# Patient Record
Sex: Female | Born: 1945 | Race: White | Hispanic: No | Smoking: Former smoker
Health system: Southern US, Community
[De-identification: ages and names within clinical notes are randomized; demographics above are authoritative.]

## PROBLEM LIST (undated history)

## (undated) DIAGNOSIS — G4730 Sleep apnea, unspecified: Secondary | ICD-10-CM

## (undated) DIAGNOSIS — N189 Chronic kidney disease, unspecified: Secondary | ICD-10-CM

## (undated) DIAGNOSIS — I5082 Biventricular heart failure: Secondary | ICD-10-CM

## (undated) DIAGNOSIS — G4733 Obstructive sleep apnea (adult) (pediatric): Secondary | ICD-10-CM

## (undated) DIAGNOSIS — M1990 Unspecified osteoarthritis, unspecified site: Secondary | ICD-10-CM

## (undated) DIAGNOSIS — I10 Essential (primary) hypertension: Secondary | ICD-10-CM

## (undated) DIAGNOSIS — M2042 Other hammer toe(s) (acquired), left foot: Secondary | ICD-10-CM

## (undated) DIAGNOSIS — I5022 Chronic systolic (congestive) heart failure: Secondary | ICD-10-CM

## (undated) DIAGNOSIS — I4892 Unspecified atrial flutter: Secondary | ICD-10-CM

## (undated) DIAGNOSIS — L84 Corns and callosities: Secondary | ICD-10-CM

## (undated) DIAGNOSIS — E539 Vitamin B deficiency, unspecified: Secondary | ICD-10-CM

## (undated) DIAGNOSIS — E1142 Type 2 diabetes mellitus with diabetic polyneuropathy: Secondary | ICD-10-CM

## (undated) DIAGNOSIS — E785 Hyperlipidemia, unspecified: Secondary | ICD-10-CM

## (undated) DIAGNOSIS — I639 Cerebral infarction, unspecified: Secondary | ICD-10-CM

## (undated) DIAGNOSIS — M2041 Other hammer toe(s) (acquired), right foot: Secondary | ICD-10-CM

## (undated) DIAGNOSIS — E079 Disorder of thyroid, unspecified: Secondary | ICD-10-CM

## (undated) DIAGNOSIS — J9601 Acute respiratory failure with hypoxia: Secondary | ICD-10-CM

## (undated) DIAGNOSIS — I6350 Cerebral infarction due to unspecified occlusion or stenosis of unspecified cerebral artery: Secondary | ICD-10-CM

## (undated) DIAGNOSIS — E1139 Type 2 diabetes mellitus with other diabetic ophthalmic complication: Secondary | ICD-10-CM

## (undated) DIAGNOSIS — E039 Hypothyroidism, unspecified: Secondary | ICD-10-CM

## (undated) HISTORY — DX: Hyperlipidemia, unspecified: E78.5

## (undated) HISTORY — DX: Other hammer toe(s) (acquired), right foot: M20.41

## (undated) HISTORY — DX: Cerebral infarction due to unspecified occlusion or stenosis of unspecified cerebral artery: I63.50

## (undated) HISTORY — DX: Vitamin B deficiency, unspecified: E53.9

## (undated) HISTORY — PX: CHOLECYSTECTOMY: SHX55

## (undated) HISTORY — DX: Unspecified osteoarthritis, unspecified site: M19.90

## (undated) HISTORY — DX: Sleep apnea, unspecified: G47.30

## (undated) HISTORY — DX: Unspecified atrial flutter: I48.92

## (undated) HISTORY — PX: KNEE SURGERY: SHX244

## (undated) HISTORY — DX: Obstructive sleep apnea (adult) (pediatric): G47.33

## (undated) HISTORY — PX: APPENDECTOMY: SHX54

## (undated) HISTORY — DX: Corns and callosities: L84

## (undated) HISTORY — DX: Type 2 diabetes mellitus with other diabetic ophthalmic complication: E11.39

## (undated) HISTORY — DX: Other hammer toe(s) (acquired), left foot: M20.42

## (undated) HISTORY — DX: Chronic systolic (congestive) heart failure: I50.22

## (undated) HISTORY — DX: Acute respiratory failure with hypoxia: J96.01

## (undated) HISTORY — PX: GASTROCYSTOPLASTY: SHX1696

## (undated) HISTORY — DX: Cerebral infarction, unspecified: I63.9

## (undated) HISTORY — DX: Essential (primary) hypertension: I10

## (undated) HISTORY — DX: Biventricular heart failure: I50.82

## (undated) HISTORY — DX: Disorder of thyroid, unspecified: E07.9

## (undated) HISTORY — DX: Type 2 diabetes mellitus with diabetic polyneuropathy: E11.42

---
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
## 1999-09-09 HISTORY — PX: KNEE SURGERY: SHX244

## 1999-12-19 ENCOUNTER — Encounter: Payer: Self-pay | Admitting: Orthopedic Surgery

## 1999-12-25 ENCOUNTER — Inpatient Hospital Stay (HOSPITAL_COMMUNITY): Admission: RE | Admit: 1999-12-25 | Discharge: 1999-12-29 | Payer: Self-pay | Admitting: Orthopedic Surgery

## 2000-03-13 ENCOUNTER — Inpatient Hospital Stay (HOSPITAL_COMMUNITY): Admission: RE | Admit: 2000-03-13 | Discharge: 2000-03-17 | Payer: Self-pay | Admitting: Orthopedic Surgery

## 2001-11-25 ENCOUNTER — Inpatient Hospital Stay (HOSPITAL_COMMUNITY): Admission: EM | Admit: 2001-11-25 | Discharge: 2001-11-26 | Payer: Self-pay | Admitting: *Deleted

## 2004-09-08 HISTORY — PX: GASTROCYSTOPLASTY: SHX1696

## 2005-07-02 ENCOUNTER — Ambulatory Visit (HOSPITAL_COMMUNITY): Admission: RE | Admit: 2005-07-02 | Discharge: 2005-07-02 | Payer: Self-pay | Admitting: General Surgery

## 2005-07-02 ENCOUNTER — Ambulatory Visit (HOSPITAL_COMMUNITY): Admission: RE | Admit: 2005-07-02 | Discharge: 2005-07-02 | Payer: Self-pay | Admitting: Obstetrics and Gynecology

## 2005-08-05 ENCOUNTER — Encounter: Admission: RE | Admit: 2005-08-05 | Discharge: 2005-09-07 | Payer: Self-pay | Admitting: Surgery

## 2005-09-22 ENCOUNTER — Encounter: Admission: RE | Admit: 2005-09-22 | Discharge: 2005-12-21 | Payer: Self-pay | Admitting: Surgery

## 2005-10-06 ENCOUNTER — Inpatient Hospital Stay (HOSPITAL_COMMUNITY): Admission: RE | Admit: 2005-10-06 | Discharge: 2005-10-08 | Payer: Self-pay | Admitting: Surgery

## 2005-12-31 ENCOUNTER — Encounter: Admission: RE | Admit: 2005-12-31 | Discharge: 2006-02-03 | Payer: Self-pay | Admitting: Surgery

## 2006-03-04 ENCOUNTER — Encounter: Admission: RE | Admit: 2006-03-04 | Discharge: 2006-06-02 | Payer: Self-pay | Admitting: Surgery

## 2007-06-29 ENCOUNTER — Ambulatory Visit (HOSPITAL_COMMUNITY): Admission: RE | Admit: 2007-06-29 | Discharge: 2007-06-29 | Payer: Self-pay | Admitting: Surgery

## 2007-06-29 ENCOUNTER — Encounter (INDEPENDENT_AMBULATORY_CARE_PROVIDER_SITE_OTHER): Payer: Self-pay | Admitting: Surgery

## 2007-09-09 HISTORY — PX: CHOLECYSTECTOMY: SHX55

## 2009-09-10 ENCOUNTER — Emergency Department (HOSPITAL_COMMUNITY): Admission: EM | Admit: 2009-09-10 | Discharge: 2009-09-10 | Payer: Self-pay | Admitting: Emergency Medicine

## 2011-01-21 NOTE — Op Note (Signed)
NAMECELESTER, MORGAN NO.:  [id number]   MEDICAL RECORD NO.:  [id number]          PATIENT TYPE:  AMB   LOCATION:  DAY                          FACILITY:  [facility]   PHYSICIAN:  Sandria Bales. Ezzard Standing, M.D.  DATE OF BIRTH:  19-Dec-1945   DATE OF PROCEDURE:  06/29/2007  DATE OF DISCHARGE:                               OPERATIVE REPORT   PREOPERATIVE DIAGNOSIS:  Symptomatic cholelithiasis.   POSTOPERATIVE DIAGNOSIS:  Chronic cholecystitis with cholelithiasis, no  evidence of internal hernia.   PROCEDURE:  Laparoscopic cholecystectomy with intraoperative  cholangiogram and laparoscopic exploration for approximately 15 minutes,  unable to do endoscopy because of equipment failure.   SURGEON:  Sandria Bales. Ezzard Standing, M.D.   FIRST ASSISTANT:  Alfonse Ras, MD   ANESTHESIA:  General endotracheal.   Estimated blood loss is minimal.   INDICATIONS FOR PROCEDURE:  Ms. Landrus is 65 year old white female who is  a patient of Dr. 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 Ovidio Kin.  Her preoperative weight was 314  with a BMI of 50.6, her weight is now down to 230 with a BMI of 36.9.  She is still interested in losing some more weight but developed some  epigastric pain and pressure which went through her back.  An ultrasound  showed she had multiple gallstones and she now comes for potential  laparoscopic cholecystectomy.   The indications and complications of gallbladder surgery were explained  to the patient.  Potential indications include bleeding, infection, bile  duct injury, with the possibility of open surgery.  Also discussed with  her that since she has had prior bariatric surgery, she has symptoms  which are probably due to her gallbladder disease but not commonly  typical.  I would also do a laparoscopic exploration for internal  hernias and also attempt to do an upper endoscopy to look at her pouch  and make sure there is no  ulcer.   OPERATIVE NOTE:  The patient was placed in the supine position, given a  general external anesthetic.  She had 1 gram of Ancef at the initiation  of the procedure.  She had PS stockings in place.   I accessed her abdomen in the right upper quadrant using a 5 mm Optiview  trocar that I inserted into the abdominal cavity and insufflated the  abdomen.  I then placed a 5-mm Optiview above and lateral to the  umbilicus.  A 10 mm subxiphoid trocar and a 5 mm lateral subcostal  trocar.   I carried out an exploration, first looking at the liver, which was  unremarkable.  Her stomach pouch was scarred down by the canal.  The  pouch was unremarkable but I had a look at her small bowel at the end of  the procedure.   I then turned my attention to the gallbladder.  The gallbladder had some  adhesions along the anterior abdominal wall consistent with some mild,  chronic disease.  She had a very floppy liver, which was very mobile. I  rolled this around.  I identified her cystic artery, which I doubly  endoclipped.  I identified the cystic duct.  I placed a clip on the  gallbladder side of the cystic duct.   I did an intraoperative cholangiogram using a cut off Taut catheter and  inserted through a 14 gauge Jelco catheter into the abdominal cavity and  I inserted the Taut catheter into the side of the cut cystic duct and  secured the cystic duct with the Endoclip.   I shot half strength Renografin under floroscopy down the cystic duct  into the bile duct into the duodenum.  This was all seen under  fluoroscopy.  There was no filling defect, no abnormalities.  This was  felt to be a normal intraoperative cholangiogram.  Of note, she did have  cholesterol looking debris in her cystic duct which I milked out.  I  think this may have been the cause of her symptoms.   I then removed the Taut catheter, triply endoclipped the cystic duct and  then bluntly and sharply dissected the  gallbladder from the gallbladder  bed using primary hook Bovie coagulation.  Prior to division of the  gallbladder from the gallbladder bed, I revisualized at the triangle of  Calot.  I revisualized the gallbladder bed, there was no bleeding or  bile leak.  I placed the gallbladder in an EndoCatch bag and tucked it  over the liver.  I then placed an additional fifth port in the right  lower quadrant and carried out a laparoscopic exploration.  I was able  to run the small bowel from the gastric pouch going down the gastric  limb of the jejunum to the enteroenterostomy.  I identified no real  evidence of a Peterson's defect and certainly no herniated bowel through  this.  I looked at the enteroenterotomy.  There was no evidence of a  hernia in the small bowel mesentery.  I then ran the small bowel  distally to the duodenum and this looked okay.  There was no dilated  bowel and no adhesions and actually she had surprisingly little  adhesions except for onto the liver where the stomach pouch was created.  I felt that she had no internal hernias, no obvious other source for  abdominal pain.   I then removed my trocars.  I closed the skin edge trocar with a 5-0  Vicryl suture, painted the wounds with Dermabond.  Sponge and needle  count were correct at the end of the case.   I then planned to do an upper endoscopy, however, the endoscope's light  did not work.  We switched to a second  endoscope.  It still did not  work.  We had waited some 15-20 minutes.  I thought that since she did  have I think a clear reason to have symptoms from her gallbladder.  She  had no other internal hernias or any abnormalities.  I felt safe in just  not going ahead with doing the endoscopy because of equipment failure.      Sandria Bales. Ezzard Standing, M.D.  Electronically Signed     DHN/MEDQ  D:  06/29/2007  T:  06/29/2007  Job:  [id number]   cc:   Roney Marion, MD

## 2011-01-24 NOTE — Cardiovascular Report (Signed)
[place]. [facility]  Patient:    LYNISHA, OSUCH Visit Number: [id number] MRN: [id number]          Service Type: MED Location: [phone] Attending Physician:  Learta Codding Dictated by:   Rollene Rotunda, M.D. Proc. Date: 11/26/01 Admit Date:  11/25/2001 Discharge Date: 11/26/2001   CC:         Harl Bowie, M.D.   Cardiac Catheterization  DATE OF BIRTH:  [birth year]  PRIMARY PHYSICIAN:  Roney Marion, M.D.  CARDIOLOGIST:  Harl Bowie, M.D.  PROCEDURE:  Left heart catheterization/coronary arteriography.  INDICATIONS:  Patient with chest pain and Cardiolite suggesting inferior ischemia.  PROCEDURE:  Left heart catheterization via the right femoral artery.  The artery was cannulated using the anterior wall puncture.  A #6 French arterial sheath was inserted via the modified Seldinger technique.  Preformed Judkins and a pigtailed catheter was utilized.  The patient tolerated the procedure well and left the lab in stable condition.  RESULTS:  HEMODYNAMICS:  LV 176/28, AO 177/88.  CORONARIES: 1. Left main was normal. 2. Left anterior descending normal. 3. The circumflex was normal. 4. The right coronary artery was dominant and normal.  LEFT VENTRICULOGRAM:  The left ventriculogram was obtained in the RAO projection.  The ejection fraction is 65% with normal wall motion.  CONCLUSION:  Normal coronary arteries, normal left ventricular function.  PLAN:  No further cardiac work up is planned.  The patient will follow with Dr. Sherlyn Lick and with his primary caregiver for evaluation of nonanginal chest pain. Dictated by:   Rollene Rotunda, M.D. Attending Physician:  Learta Codding DD:  11/26/01 TD:  11/29/01 Job: [id number]

## 2011-01-24 NOTE — Op Note (Signed)
NAMEKELYN, PONCIANO NO.:  [id number]   MEDICAL RECORD NO.:  [id number]          PATIENT TYPE:  INP   LOCATION:  1618                         FACILITY:  [facility]   PHYSICIAN:  Sharlet Salina T. Hoxworth, M.D.DATE OF BIRTH:  1945-11-14   DATE OF PROCEDURE:  10/06/2005  DATE OF DISCHARGE:                                 OPERATIVE REPORT   PROCEDURE:  Upper gastrointestinal endoscopy.   DESCRIPTION OF PROCEDURE:  Upper GI endoscopy is performed at the completion  of laparoscopic Roux-en-Y gastric bypass by Dr. Ezzard Standing. The Olympus video  endoscope was inserted into the upper esophagus and then passed under direct  vision to the EG junction at 45 cm. The small gastric pouch was entered. It  was then tensely distended with air while Dr. Ezzard Standing clamped the outlet; and  under saline, there was no evidence of leakage intraoperatively. The  anastomosis was patent. Suture and staple lines were intact. There was no  bleeding. The pouch measured 6 cm in length. Following completion, the pouch  was desufflated; and the scope withdrawn.      Lorne Skeens. Hoxworth, M.D.  Electronically Signed     BTH/MEDQ  D:  10/07/2005  T:  10/08/2005  Job:  [id number]

## 2011-01-24 NOTE — Op Note (Signed)
[facility]  Patient:    Debra Mcconnell, Debra Mcconnell                    MRN: [id number] Proc. Date: 03/13/00 Adm. Date:  [id number] Attending:  Ollen Gross V                           Operative Report  PREOPERATIVE DIAGNOSIS:  Osteoarthritis, right knee.  POSTOPERATIVE DIAGNOSIS:  Osteoarthritis, right knee.  PROCEDURE:  Right total knee arthroplasty.  SURGEON:  Ollen Gross, M.D.  ASSISTANT:  Cherly Beach, P.A.  ANESTHESIA:  General.  ESTIMATED BLOOD LOSS:  Minimal.  DRAIN:  Hemovac x 1.  TOURNIQUET TIME:  69 minutes at 350 mmHg.  COMPLICATIONS:  None.  CONDITION:  Stable to recovery.  BRIEF HISTORY:  Debra Mcconnell is a 65 year old on whom I performed a left total knee arthroplasty several months ago with excellent results. She presents now for r total knee arthroplasty.  DESCRIPTION OF PROCEDURE:  After successful administration of general anesthetic, a tourniquet was placed high on the right thigh and right lower extremity prepped and draped in the usual sterile fashion. Extremities wrapped in Esmarch, knee flexed and tourniquet inflated to 350 mmHg. A standard midline incision made, skin cut with a 10 blade through subcutaneous tissue to the level of the extensor mechanism. The fresh blade was then used to make a medial parapatellar arthrotomy. Soft tissue of the proximal medial tibia subperiosteally elevated to the joint line with a knife and into the semimembranosus bursa with a curved osteotome. The soft tissue of the proximal lateral tibia was also elevated with attention being paid to avoid any patellar tendon on the tibial tubercle. The lateral patellofemoral ligament was cut, patella everted and efflexed 90 degrees.  ACL and PCL were then removed and the drill hole was placed through the distal femur for placement of the alignment rod. The canal was irrigated and the alignment rod is placed in 5 degrees of valgus and referencing off  the posterior condyle, the distal cutting block is pinned so as to remove 9 mm off the distal femur. Distal femoral resection is made and then the sizing block is placed and size 4 is the most appropriate femur. Rotation is marked and corresponds at the epicondylar axis. The cutting block is placed and the anterior and posterior cuts made. The tibia is then subluxed forward and the menisci removed. The extramedullary tibial alignment guide is then placed and referencing at the medial aspect of the tibial tubercle proximally and then distally along the tibial crest and second metatarsal access. The block is subsequently pinned so as to remove 10 mm off the nondeficient lateral tibial plateau. Resection is then made with an oscillating saw. A size 3 is the most appropriate tibial component. At this point, the chamfer block is placed on the femur and intercondylar and chamferring cuts are made. A trial size 4 posterior stabilized femur with a size 3 rotating platform tibia and a 10 mm posterior stabilized rotating platform inserter placed. She had a tiny bit of play in extension and a 12.5 mm inserts placed with outstanding stability. The varus and valgus stressing at full extension down past 90 degrees of flexion. At this point, the patella was everted and the thickness measured to be 23 mm. Free hand resection was taken down to 13 mm and then a 38 mm template is placed and the local was drilled. A trial patella is  placed with excellent tracking. The proximal tibia is then repaired with the modular drill and keel punch and then the osteophytes are removed off the posterior femoral condyles with the trial in place. At this point, the cut bone surfaces are prepared with pulsatile lavage and cement mixed. Once ready for implantation, the size 3 rotating platform tibial tray with a size 4 posterior stabilized femur and a 38 mm patella cemented into place. The patella was held with a patella  clamp. A trial 12.5 mm insert is placed, knee held in full extension and then all extruded cement removed. Once the cement was fully hardened then the permanent 12.5 mm rotating platform insert is placed. Once again she had outstanding stability throughout range of motion. The wound was then copiously irrigated with antibiotic solution and the extensor mechanism closed over 1 limb of the Hemovac drain. The tourniquet was then released with a total time of 69 minutes. The subcu was closed in 2 layers with interrupted 2-0 Vicryl and subcuticular closed with running 4-0 monocryl. The incision was then cleaned and dried and the drain hooked to suction, bulky sterile dressing applied. She was placed into the knee immobilizer, awakened and transported to recovery stable condition. DD:  03/16/00 TD:  03/16/00 Job: [id number]

## 2011-01-24 NOTE — Discharge Summary (Signed)
[place]. [facility]  Patient:    Debra Mcconnell, Debra Mcconnell Visit Number: [id number] MRN: [id number]          Service Type: MED Location: [phone] Attending Physician:  Learta Codding Dictated by:   Lavella Hammock, P.A. Admit Date:  11/25/2001 Discharge Date: 11/26/2001   CC:         Dr. Lawana Pai in [place], [place]  Dr. Thereasa Solo Dhatt   Referring Physician Discharge [facility]  DATE OF BIRTH:  1945/10/10  PROCEDURES: 1. Cardiac catheterization. 2. Coronary arteriogram. 3. Left ventriculogram.  HOSPITAL COURSE:  Ms. Burleigh is a 65 year old female with no known history of coronary artery disease who was admitted to [facility] on November 23, 2001 for chest pain.  While she was there, she was evaluated by Dr. Sherlyn Lick. She had enzymes that were negative for MI but she had a Cardiolite showing a small inferior area of ischemia.  Because of multiple risk factors for coronary artery disease and a positive Cardiolite, she was transported to [facility] for further evaluation and treatment.  At [facility] she was scheduled for a cardiac catheterization, which was performed on November 26, 2001.  The cardiac catheterization showed a normal LAD, a normal circumflex, a normal RCA, and an EF of 65%.  There was no cardiac source of chest pain identified and no further cardiac workup was indicated.  She was advised to follow up with Dr. Sherlyn Lick and her primary care physician.  Post catheterization she had good distal pulses and her catheterization site was without ecchymosis, hematoma, or bruit.  She was considered stable for discharge on November 26, 2001 p.m.  DISCHARGE CONDITION:  Stable.  DISCHARGE DIAGNOSES: 1. Chest pain, no cardiac source of chest pain found. 2. Non-insulin-dependent diabetes mellitus. 3. Hypertension. 4. Obesity. 5. Depression. 6. Fibrositis. 7. Remote history of tobacco use. 8. Lower extremity edema, resolved.  DISCHARGE  INSTRUCTIONS:  ACTIVITY:  Her activity level is to include no driving, sexual, or strenuous activity for two days.  DIET:  She is to stick to a low salt and fat diabetic diet.  WOUND CARE:  She is to call the office for bleeding, swelling, or drainage at the catheterization site.  FOLLOW-UP:  She is to follow up with Dr. Sherlyn Lick on a p.r.n. basis.  She is to see Dr. Lawana Pai within two weeks.  DISCHARGE MEDICATIONS:  1. Glucophage 500 mg two tablets b.i.d. - restart Monday, November 29, 2001.  2. Actos 30 mg q.d.  3. Glyburide 6 mg b.i.d.  4. Aspirin 81 mg q.d.  5. Lozol 1.25 mg b.i.d.  6. Norvasc 5 mg q.d.  7. Diovan 80 mg q.d.  8. Wellbutrin 100 mg q.d.  9. Celebrex 100 mg q.d. 10. Ambien p.r.n. Dictated by:   Lavella Hammock, P.A. Attending Physician:  Learta Codding DD:  11/26/01 TD:  11/26/01 Job: [id number]

## 2011-01-24 NOTE — H&P (Signed)
[facility]  Patient:    Debra Mcconnell, Debra Mcconnell                    MRN: [id number] Adm. Date:  [id number] Disc. Date: [id number] Attending:  Loanne Drilling Dictator:   Druscilla Brownie Underwood III, P.A.-C.                         History and Physical  CHIEF COMPLAINT:  "Pain in my right knee."  HISTORY OF PRESENT ILLNESS:  This 65 year old white female who successfully has  undergone a left total knee replacement arthroplasty by Dr. Ollen Gross in April of this year, has done very well with that knee, and truly appreciates the significant change in her level of activity.  Unfortunately the right knee has continued to have a deteriorating situation with bone on bone deformity crepitus in range of motion, and a marked decrease in her activity.  She is highly desirous to have a better level of life, and is looking to a right total knee replacement arthroplasty.  She is scheduled for that surgery.  She has donated no blood for  this procedure, and will not have inpatient rehabilitation.  PAST MEDICAL HISTORY:  Please refer to the history and physical of April of this year.  There have been no changes.  She is currently being treated for hypertension, non-insulin-dependent diabetes mellitus, GERD, and depression. She also has inhalant allergies.  ALLERGIES:  No known drug allergies.  CURRENT MEDICATIONS:  1. Glucophage 5 mg two tablets b.i.d.  2. Glyburide one tablet q.d.  3. Actos 45 mg one q.d.  Will take 50 mg while in the hospital.  4. Alprazolam 5 mg one b.i.d.  5. Norvasc 5 mg one q.d.  6. Accupril 4 mg one q.d.  7. _________ 1.25 mg one b.i.d.  8. Wellbutrin 100 mg one q.d.  9. Celebrex 200 mg one q.d.  Will stop prior to surgery. 10. Allegra 180 mg one q.d.  SOCIAL HISTORY:  The patient neither smokes or drinks.  FAMILY HISTORY:  Noncontributory.  REVIEW OF SYSTEMS:  CNS:  No serious problems with numbness or double vision. RESPIRATORY:  No  productive cough.  No hemoptysis.  No shortness of breath. CARDIOVASCULAR:  No chest pain, no angina, no orthopnea.  GASTROINTESTINAL:  No  nausea, vomiting, melena, or bloody stool.  GERD controlled with medication. GENITOURINARY:  No discharge, dysuria, hematuria.  MUSCULOSKELETAL:  Primarily n the present illness with her right knee.  PHYSICAL EXAMINATION:  GENERAL:  Alert and cooperative and friendly, somewhat obese 65 year old white female.  VITAL SIGNS:  Blood pressure 160/84, pulse 80, respirations 12.  HEENT:  Normocephalic.  Wears glasses.  Pupils equal, round, reactive to light nd accommodation.  EOMs intact.  Oropharynx clear.  CHEST:  Clear to auscultation.  No rhonchi, no rales.  HEART:  A regular rate and rhythm.  No murmurs heard.  ABDOMEN:  Soft, tender.  Liver and spleen not felt.  GENITOURINARY/RECTAL/PELVIC/BREASTS:  Examinations not done, not pertinent to the present illness.  EXTREMITIES:  Right knee as in the present illness above.  ADMISSION DIAGNOSES: 1. Severe osteoarthritis, right knee. 2. Hypertension. 3. Non-insulin-dependent diabetes mellitus. 4. Gastroesophageal reflux disease. 5. Depression. 6. Inhalant allergies.  PLAN:  The patient will be admitted for a right total knee replacement arthroplasty.  We will use for postoperative pain control Demerol PCA.  (MSO4 did not help last time.)  No inpatient rehabilitation is indicated.  She will have ome physical therapy.  She has donated no blood for this procedure. DD:  03/05/00 TD:  03/05/00 Job: [id number]

## 2011-01-24 NOTE — Discharge Summary (Signed)
[facility]  Patient:    Debra Mcconnell, Debra Mcconnell                    MRN: [id number] Adm. Date:  [id number] Disc. Date: [id number] Attending:  Loanne Drilling Dictator:   Della Goo, P.A.C.                           Discharge Summary  ADMISSION DIAGNOSES:  1. Severe tricompartmental arthritis, bilateral knees, left greater than     right.  2. Hypertension.  3. Non-insulin dependent diabetes mellitus.  4. Gastroesophageal reflux disease.  5. Depression.  DISCHARGE DIAGNOSES:  1. Severe tricompartmental arthritis, bilateral knees, left greater than     right.  2. Hypertension.  3. Non-insulin dependent diabetes mellitus.  4. Gastroesophageal reflux disease.  5. Depression.  6. Post hemorrhagic anemia not requiring blood transfusion.  7. Mild hyponatremia, resolved.  8. Postoperative hypertension, resolved.  9. Hypokalemia, postoperatively resolved after oral supplementation. 10. Urinary tract infection.  PROCEDURES:  On December 25, 1999, the patient underwent left total knee replacement by Dr. Lequita Halt assisted by Avel Peace under failed spinal followed by general anesthesia.  CONSULTATIONS:  Dr. Johna Roles of physical medicine and rehabilitation.  BRIEF HISTORY:  [name] is a 65 year old white female with known severe osteoarthritis of bilateral knees, left more symptomatic than right.  She has failed nonoperative management and was admitted to the hospital to undergo the above-stated procedure.  BRIEF HOSPITAL COURSE:  The patient tolerated the procedure without difficulties.  Postoperatively, neurovascular motor function was found to be intact.  The patient did have postoperative hypertension and her blood pressure medications were restarted.  She had significant discomfort with pain control and after utilizing PCA morphine without relief of her symptoms she was given PCA Demerol.  Gradually her pain was better controlled.  She was eventually  weaned from the IV analgesics on to p.o. analgesics without difficulties.  On the first postoperative day, the patients Hemovac drain was removed from the left knee without difficulty.  Dressing changes were done thereafter and wound was found to be healing well without signs of drainage, erythema, edema, or infection.  The patient was placed on Coumadin for DVT and PE prophylaxis.  Adjustments in Coumadin were made according to daily pro times.  Due to the usual hospital protocol, Glucophage was held postoperatively and resumed on December 27, 1999, which was patients second postoperative day.  Her creatinine was noted to be 0.6 at the time of continuation of her Glucophage.  The patient was noted to have mild hyponatremia and was treated with fluid restriction.  She was diuresing quite well.  Her hypokalemia was treated with oral K-Dur.  This resolved after treatment with oral supplementation.  The patient was started on the usual physical therapy program for ambulation and gait training as well as range of motion and strengthening exercises.  She was allowed weightbearing as tolerated on the operative extremity.  Physical therapy assisted her with active and passive range of motion.   She utilized the CPM machine without difficulties.  Occupational therapy assisted the patient with ADLs and she was independent with ADLs prior to transfer to home.  She was seen by Dr. Johna Roles in consultation for possible inpatient rehabilitation stay.  The patient did feel that she could manage at home with family and friends assisting her.  She was ambulating as much as 120 feet and did not meet the  criteria for inpatient rehabilitation.  The patient was able to gain 74 degrees of flexion.  She tolerated quad sets well, however, was unable to do a straight leg raise prior to discharge.  She continued using her immobilizer during the hospital stay and was instructed to do so at home until she is able to do a  straight leg raise.  On December 29, 1999, she was continuing to progress well and quite comfortable with oral analgesics.  Wound continued to heal well with neurovascular motor function intact in both lower extremities.  The patient was stable for discharge.  PERTINENT LABORATORY VALUES:  Admission CBC with hemoglobulin and hematocrit, 13.1 and 38.6, respectively.  Postoperatively, hemoglobin dropped to the lowest value of 11.4.  She did not require a blood transfusion during the hospital stay.  Coagulation studies were normal on admission and showed elevations in pro time while on Coumadin during the hospital stay.  Admission chemistry studies with glucose 148.  Remaining values normal.  Repeat chemistries showed hyponatremia ranging from 130 to 134.  Hypokalemia at 3.0 resolved with a value of 3.5.  The patient had elevated glucose at 210 on her postoperative chemistry studies.  Calcium was noted to be low at 7.9, 8.2 and 8.3 with repeat labs during the hospital stay.  Admission urinalysis with 30 g of protein, small leukocyte esterase and a few epithelial cells.  Repeat on April 18 showed large hemoglobin, trace ketones, 30 g protein, many epithelial cells, 6 to 10 wbcs.  The patient was treated with IV Ancef postoperatively. A repeat urinalysis on April 20 showed large hemoglobin, 100 g of protein, trace leukocyte esterase, few epithelials, 6 to 10 rbcs and 0 to 5 wbcs.  Uc taken on December 29, 1999, showed greater than 1000 colonies of Acinetobacter baumanii/haemolyticus.  ECG on admission showed normal sinus rhythm with first-degree A-V block with no old tracings for comparison, confirmed by Dr. Donnie Aho.  Chest x-ray on admission showed no active disease.  PLAN:  The patient was discharged to her home in stable condition.  She was given prescriptions for Robaxin, Percocet and Coumadin.  The patient will have pro times drawn weekly with results phoned to [facility]  for adjustments in her Coumadin dosage per pharmacy protocol.  The patient has been advised to change her dressing daily at home.  She will be allowed to  shower, however, has been advised to keep the wound covered while showering and then changing the dressing after showering.  She will continue to ambulate weightbearing as tolerated and is encouraged to do active and passive range of motion.  She will continue her knee immobilizer until she is able to do a straight leg raise.  At that time, it will be removed for ambulation as well.  FOLLOWUP:  The patient will follow up with Dr. Lequita Halt two weeks from the date of her surgery.  If she has questions or concerns prior to her return office visit, she has been advised to call the office. DD:  01/16/00 TD:  01/20/00 Job: [id number]

## 2011-01-24 NOTE — H&P (Signed)
[place]  Patient:    Debra Mcconnell, Debra Mcconnell Visit Number: [id number] MRN: [id number]          Service Type: MED Location: [phone] Attending Physician:  Daisey Must Dictated by:   Lewayne Bunting, M.D. [facility] Admit Date:  11/25/2001   CC:         Harl Bowie, M.D.  Boykin Nearing., M.D.; [facility]   History and Physical  DATE OF BIRTH:  June 23, 1946  REFERRING PHYSICIAN:  Harl Bowie, M.D.  PRIMARY CARE PHYSICIAN:  Boykin Nearing., M.D.  CHIEF COMPLAINT:  Substernal epigastric discomfort.  HISTORY OF PRESENT ILLNESS:  Ms. Bayon is a 65 year old female with non-insulin-dependent diabetes mellitus and hypertension.  The patient was admitted two days ago at [facility] for epigastric pain which started around 11 oclock that same day.  The patient did experience some substernal discomfort the night before.  She denied any associated nausea or vomiting. She also denied diaphoresis.  The patient has tried a variety of remnants including Tums and baking soda without relief.  She did report today that she had taken calcium tablets which made her constipated and thought this led to her symptoms.  The patient denies exertional chest pain or exertional shortness of breath over the last several weeks.  The patient was admitted to [facility] and ruled out for myocardial infarction.  Today she underwent a Cardiolite stress study which showed a small area of inferior ischemia.  Electrocardiograms have remained unchanged.  The patient is currently pain free and is being evaluated for cardiac catheterization in the morning.  ALLERGIES:  No known drug allergies.  MEDICATIONS: 1. Glucophage 5 mg two tablets p.o. b.i.d. 2. Actos 30 mg q.d. 3. Glyburide 6 mg p.o. b.i.d. 4. Aspirin q.d. 5. Lozol. 6. Norvasc. 7. Diovan. 8. Wellbutrin 100 mg p.o. q.d. 9. Celebrex q.d.  PAST MEDICAL HISTORY: 1.  Non-insulin-dependent diabetes mellitus. 2. Obesity. 3. Hypertension. 4. History of depression. 5. History of fibrositis.  SOCIAL HISTORY:  [place]  She is divorced and the mother of two sons.  She quit smoking in 1992.  She denies significant alcohol use.  She is a Naval architect.  FAMILY HISTORY:  Mother died in her 36s of a cerebrovascular accident.  Father died in his 71s of malignancy.  There is no family history of significant coronary artery disease in first degree siblings.  REVIEW OF SYSTEMS:  As per HPI.  No nausea or vomiting.  No fever or chills. No headache or sore throat.  Chest pain as outlined above.  No orthopnea or PND.  No palpitations or syncope.  No dysuria or frequency.  PHYSICAL EXAMINATION: VITAL SIGNS:  Blood pressure is 145/82, heart rate 90 beats per minute. Temperature is afebrile.  GENERAL:  Obese white female in no apparent distress.  HEENT:  Pupils are equal.  Conjunctivae clear.  NECK: Supple.  No upstroke.  No carotid bruits.  LUNGS:  Clear breath sounds bilaterally.  HEART:  Regular rate and rhythm.  Normal S1 and S2.  No murmurs, rubs or gallops.  ABDOMEN:  Soft, nontender, no rebound or guarding.  Good bowel sounds. Distended.  EXTREMITIES:  There are 2+ peripheral pulses.  There is 1-2+ peripheral pitting edema.  NEUROLOGICAL:  The patient is alert, oriented and grossly nonfocal.  LABORATORY DATA:  A chest x-ray shows no acute abnormalities.  A 12-lead electrocardiogram shows normal sinus rhythm with no  acute ischemic changes and no myocardial infarct patterns.  Cardiac enzymes are within normal limits.  CBC and B-MET are pending.  IMPRESSION AND PLAN: 1. Rule out unstable angina.  The patient has multiple risk factors for    coronary artery disease including coronary artery disease risk equivalent    with diabetes mellitus.  However, chest pain/epigastric pain is rather    atypical.   Cardiolite study did demonstrate possible inferior ischemia, and    the patient has been referred for cardiac catheterization.  I have    discussed risk and benefits of the procedure with the patient, and she is    willing to proceed.  In the interim, the patient will be continued on    Nitrol paste and IV heparin. 2. Hypertension, controlled. 3. Diabetes mellitus.  Will old oral hypoglycemics for cardiac catheterization    and start sliding scale insulin.  Glucophage will be held for 48 hours    post catheterization. 4. Lower extremity edema likely due to use of dihydropyridine calcium    channel blocker.  We will need to review hemodynamics during cardiac    catheterization.  The patient may benefit from a diuretic to improve her    control of blood pressure.  DISPOSITION:  I have discussed risks and benefits of the cardiac catheterization with the patient, and she is willing to proceed. Dictated by:   Lewayne Bunting, M.D. [facility] Attending Physician:  Daisey Must DD:  11/25/01 TD:  11/25/01 Job: [id number]

## 2011-01-24 NOTE — Op Note (Signed)
Debra Mcconnell, HENRIQUEZ NO.:  [id number]   MEDICAL RECORD NO.:  [id number]          PATIENT TYPE:  INP   LOCATION:  1618                         FACILITY:  [facility]   PHYSICIAN:  Sandria Bales. Ezzard Standing, M.D.  DATE OF BIRTH:  09/29/1945   DATE OF PROCEDURE:  10/06/2005  DATE OF DISCHARGE:                                 OPERATIVE REPORT   PREOPERATIVE DIAGNOSIS:  Morbid obesity with a weight of approximately 314  pounds, a body mass index of 50.6.   POSTOPERATIVE DIAGNOSIS:  Morbid obesity with a weight of approximately 314  pounds, a body mass index of 50.6.   PROCEDURE:  Laparoscopic roux-en-Y gastrojejunostomy (antecolic,  antegastric).   SURGEON:  Sandria Bales. Ezzard Standing, M.D.   FIRST ASSISTANT:  Sharlet Salina T. Hoxworth, M.D.   ANESTHESIA:  General endotracheal.   ESTIMATED BLOOD LOSS:  Minimal.   INDICATIONS FOR PROCEDURE:  Debra Mcconnell is a morbidly obese 65 year old white  female whose weight is approximately 314 pounds with a BMI of 50.6. She has  been through our preoperative bariatric program which has evaluated her from  a medical, psychiatric and nutritional standpoint and now comes for  attempted laparoscopic roux-en-Y gastric bypass. The indications and  potential complications of the gastric bypass were explained to the patient.  The potential complications include but not limited to bleeding, infection,  bowel injury, pulmonary embolism, possibility of open surgery and the long-  term nutritional consequences of her surgery.   DESCRIPTION OF PROCEDURE:  The patient underwent a general endotracheal  anesthetic. She was given a gram of cefoxitin at the initiation of the  procedure. She had PAS stockings and was given subcu heparin. Her abdomen  was prepped with Betadine solution and sterilely draped. I accessed her  abdomen through an Optiview through the left subcostal margin and then  insufflated her abdomen.   I placed 6 additional trocars for a total of 7  trocars. There was a 5 mm  subxiphoid trocar for the liver retractor. There was a right subcostal  trocar, there was a right paramedian trocar, a left perimedial trocar, a  left lateral 5 mm trocar and then an 11 trocar to the right of her  umbilicus.   Abdominal exploration revealed the right and left lobes of the liver  unremarkable. The anterior wall of the stomach was unremarkable. The bowel  was covered with omentum but was otherwise unremarkable.   First attention was to identify the ligament of Treitz. I then counted 40 cm  and divided the jejunum with white load of the EndoGIA 45 stapler. I then  counted 100 cm for the future gastric limb, I marked the proximal end of  this with a Penrose drain. The distal end I then sutured to the biliary limb  and did a side to side stapled anastomosis. I closed the enterotomy with two  2-0 Vicryl sutures. I then closed the mesenteric defect of the  jejunojejunostomy with a running 2-0 silk suture. I then placed Tisseel over  the jejunojejunostomy.   I then turned my attention to the stomach. I placed  the [facility] retractor  through the subxiphoid port to lift the left lobe of the liver up. The  patient had a fairly generous fat pad over the anterior wall of her stomach  and I found the angle of Hiss which I opened with the harmonic scalpel and  used the finger dissector to dissect off the angle of Hiss.   I then went approximately 5 cm from the gastroesophageal junction down to  the lesser curve, got into the lesser sac with the harmonic scalpel and then  divided the stomach trying to make pouch which was approximately 5 cm in  length, about 3 cm in width. I used the 45 blue load of the EndoGIA stapler  first and then I used 3 loads of the 60 mm blue stapler of the Ethicon.   This made a nice tubular pouch. I oversewed the gastric remnant with a 2-0  Vicryl suture. I placed Tisseel along the greater curvature of the new  gastric pouch. I  then brought the jejunal limb antegastric and antecolic up  to the gastric pouch.   It was noted that the patient had a fair amount of air in her colon. I did a  posterior running 2-0 Vicryl suture for the jejunum. I then did a  gastrojejunostomy creating the gastric enterotomy over an Ewald tube. I then  did a stapled side to side anastomosis with the blue load of the 45 EndoGIA  stapler. I then closed the gastrojejunostomy enterotomy with two running 2-0  Vicryl sutures and then closed anteriorly the second layer anteriorly with a  2-0 Vicryl suture and also had the interrupted suture kind of at the crotch  high on the gastrojejunostomy.   I then closed the Gordon defect with a running 2-0 silk suture. At this  point, Dr. Johna Mcconnell broke scrub, did an upper endoscopy while I clamped off  the jejunal limb. He noted the gastrojejunostomy at about 46-47 cm, her  esophagogastric junction was about 41 cm for about a 5-6 cm pouch which was  apparently tubular. There was no air leak, there was no bleeding and the  anastomosis was patent.   At this time, I irrigated out the abdomen with about a liter of saline, I  placed Tisseel over the gastrojejunostomy anteriorly. The pouch, the jejunum  and the JJ anastomosis all looked viable and pink. I did not think I needed  to leave any drains in on this case. I then removed all my trocars, I  infiltrated the skin at each site with about 30 mL of 0.25% Marcaine. I took  out the [facility] under direct visualization and then closed the  skin wounds with skin staples.   The patient tolerated the procedure well and will be transported to the  recovery room in good condition. Sponge and needle count correct at the end  of the case.      Sandria Bales. Ezzard Standing, M.D.  Electronically Signed     DHN/MEDQ  D:  10/06/2005  T:  10/06/2005  Job:  [id number]   cc:   Roney Marion, MD  fax [phone]

## 2011-01-24 NOTE — Discharge Summary (Signed)
[facility]  Patient:    Debra Mcconnell, Debra Mcconnell                    MRN: [id number] Adm. Date:  [id number] Disc. Date: [id number] Attending:  Loanne Drilling Dictator:   Ralene Bathe, P.A.                           Discharge Summary  ADMISSION DIAGNOSES: 1. End-stage osteoarthritis of the right knee. 2. Hypertension. 3. Non-insulin-dependent diabetes mellitus. 4. Gastroesophageal reflux disease. 5. Depression.  DISCHARGE DIAGNOSES: 1. End-stage osteoarthritis of the left knee. 2. Hypertension. 3. Non-insulin-dependent diabetes mellitus. 4. Gastroesophageal reflux disease. 5. Depression. 6. Status post right total knee arthroplasty.  CONSULTATIONS:  None.  OPERATIONS:  Right total knee arthroplasty under general anesthesia; surgeon Dr. Ollen Gross, assistant Alvera Novel, P.A.  BRIEF HISTORY:  This is a 65 year old female with bilateral end-stage osteoarthritis, who had previously undergone left total knee arthroplasty and did extremely well with this.  She was having pain, refractory pain, secondary to osteoarthritis of the right knee, and wished to proceed with right total knee arthroplasty.  Risks and benefits were discussed with the patient and she understood and wished to proceed.  HOSPITAL COURSE:  The patient was admitted to the hospital, underwent procedure, and tolerated this well.  All appropriate antibiotics and analgesics were provided.  Postoperatively she was placed on Coumadin for DVT prophylaxis, and started on total knee replacement protocol with physical therapy.  Postoperatively she did extremely well, was weaned off IV analgesics by postoperative day #2, her Foley was discontinued, and her IVs were stopped.  At this time she is tolerating p.o. and is working well with physical therapy and meeting all the rules.  She remained afebrile.  She had a bowel movement without complications.  Her wound remained clean and dry  throughout hospitalization.  She remained hemodynamically stable with only mild hemorrhagic anemia and a hemoglobin of 12.  On March 17, 2000, she met all the discharge goals and, at this time, was felt stable for discharge to home.  On the day of examination Dr. Lequita Halt examined the knee.  There was no erythema, no exudate, and the incision was dry.  She was neurovascularly intact.  LABORATORY DATA:  Shows pro time was monitored by pharmacy.  Admission hemoglobin 12.5, postoperatively stable at 12.3 and 11.9 prior to discharge. Chemistries on admission were within normal limits except a glucose of 151. Urinalysis on admission showed moderate leukocyte esterase, few epithelials. A repeat on the 6th showed normal urinalysis.  Blood type A positive.  EKG and chest x-ray not in this dictation, from her old chart in the spring.  CONDITION ON DISCHARGE:  Stable.  DISCHARGE PLANS:  The patient discharged to home to followup in two weeks postoperatively, call for time.  Home health PT RN for total knee replacement protocol.  Resume home meds and home diet.  Prescriptions were given for Percocet 5 mg #40 1-2 every 4-6h. p.r.n. pain.  Robaxin 500 mg #30 one refill one every 8 hours p.r.n. pain spasm.  Coumadin per pharmacy.  Ambien 10 mg #30 one q. h.s. p.r.n. sleep.  She may shower at this time postoperatively.  Call the office for any problems. DD:  03/17/00 TD:  03/17/00 Job: [id number]

## 2011-06-18 LAB — BASIC METABOLIC PANEL
BUN: 15
CO2: 29
Calcium: 9.1
Chloride: 104
GFR calc non Af Amer: >60
Potassium: 4.2
Sodium: 142

## 2011-06-18 LAB — URINALYSIS, ROUTINE W REFLEX MICROSCOPIC
Bilirubin Urine: NEGATIVE
Hgb urine dipstick: NEGATIVE
Ketones, ur: NEGATIVE
Nitrite: NEGATIVE

## 2011-06-18 LAB — DIFFERENTIAL
Basophils Relative: 0
Eosinophils Absolute: 0.1
Eosinophils Relative: 3
Lymphocytes Relative: 21
Lymphs Abs: 1
Monocytes Absolute: 0.4
Neutrophils Relative %: 67

## 2011-06-18 LAB — CBC
RDW: 13.1
WBC: 4.8

## 2011-06-18 LAB — URINE MICROSCOPIC-ADD ON

## 2013-06-20 DIAGNOSIS — Z23 Encounter for immunization: Secondary | ICD-10-CM | POA: Diagnosis not present

## 2013-06-20 DIAGNOSIS — G4730 Sleep apnea, unspecified: Secondary | ICD-10-CM | POA: Diagnosis not present

## 2013-06-20 DIAGNOSIS — E038 Other specified hypothyroidism: Secondary | ICD-10-CM | POA: Diagnosis not present

## 2013-06-20 DIAGNOSIS — E1129 Type 2 diabetes mellitus with other diabetic kidney complication: Secondary | ICD-10-CM | POA: Diagnosis not present

## 2013-06-20 DIAGNOSIS — E7800 Pure hypercholesterolemia, unspecified: Secondary | ICD-10-CM | POA: Diagnosis not present

## 2013-06-20 DIAGNOSIS — E1149 Type 2 diabetes mellitus with other diabetic neurological complication: Secondary | ICD-10-CM | POA: Diagnosis not present

## 2013-06-20 DIAGNOSIS — I10 Essential (primary) hypertension: Secondary | ICD-10-CM | POA: Diagnosis not present

## 2013-07-07 DIAGNOSIS — E11359 Type 2 diabetes mellitus with proliferative diabetic retinopathy without macular edema: Secondary | ICD-10-CM | POA: Diagnosis not present

## 2013-07-07 DIAGNOSIS — E1039 Type 1 diabetes mellitus with other diabetic ophthalmic complication: Secondary | ICD-10-CM | POA: Diagnosis not present

## 2013-07-07 DIAGNOSIS — E11311 Type 2 diabetes mellitus with unspecified diabetic retinopathy with macular edema: Secondary | ICD-10-CM | POA: Diagnosis not present

## 2013-07-21 DIAGNOSIS — H348392 Tributary (branch) retinal vein occlusion, unspecified eye, stable: Secondary | ICD-10-CM | POA: Diagnosis not present

## 2013-07-29 DIAGNOSIS — E538 Deficiency of other specified B group vitamins: Secondary | ICD-10-CM | POA: Diagnosis not present

## 2013-08-18 DIAGNOSIS — H348392 Tributary (branch) retinal vein occlusion, unspecified eye, stable: Secondary | ICD-10-CM | POA: Diagnosis not present

## 2013-08-18 DIAGNOSIS — E1039 Type 1 diabetes mellitus with other diabetic ophthalmic complication: Secondary | ICD-10-CM | POA: Diagnosis not present

## 2013-08-18 DIAGNOSIS — H3581 Retinal edema: Secondary | ICD-10-CM | POA: Diagnosis not present

## 2013-08-18 DIAGNOSIS — E11339 Type 2 diabetes mellitus with moderate nonproliferative diabetic retinopathy without macular edema: Secondary | ICD-10-CM | POA: Diagnosis not present

## 2013-08-24 DIAGNOSIS — S335XXA Sprain of ligaments of lumbar spine, initial encounter: Secondary | ICD-10-CM | POA: Diagnosis not present

## 2013-08-29 DIAGNOSIS — E538 Deficiency of other specified B group vitamins: Secondary | ICD-10-CM | POA: Diagnosis not present

## 2013-09-14 DIAGNOSIS — D649 Anemia, unspecified: Secondary | ICD-10-CM | POA: Diagnosis not present

## 2013-09-14 DIAGNOSIS — R9431 Abnormal electrocardiogram [ECG] [EKG]: Secondary | ICD-10-CM | POA: Diagnosis not present

## 2013-09-14 DIAGNOSIS — R269 Unspecified abnormalities of gait and mobility: Secondary | ICD-10-CM | POA: Diagnosis not present

## 2013-09-14 DIAGNOSIS — Z9884 Bariatric surgery status: Secondary | ICD-10-CM | POA: Diagnosis not present

## 2013-09-14 DIAGNOSIS — I10 Essential (primary) hypertension: Secondary | ICD-10-CM | POA: Diagnosis not present

## 2013-09-14 DIAGNOSIS — I517 Cardiomegaly: Secondary | ICD-10-CM | POA: Diagnosis not present

## 2013-09-14 DIAGNOSIS — S51809A Unspecified open wound of unspecified forearm, initial encounter: Secondary | ICD-10-CM | POA: Diagnosis not present

## 2013-09-14 DIAGNOSIS — E119 Type 2 diabetes mellitus without complications: Secondary | ICD-10-CM | POA: Diagnosis not present

## 2013-09-14 DIAGNOSIS — F4489 Other dissociative and conversion disorders: Secondary | ICD-10-CM | POA: Diagnosis not present

## 2013-09-14 DIAGNOSIS — I4891 Unspecified atrial fibrillation: Secondary | ICD-10-CM | POA: Diagnosis not present

## 2013-09-14 DIAGNOSIS — E785 Hyperlipidemia, unspecified: Secondary | ICD-10-CM | POA: Diagnosis not present

## 2013-09-14 DIAGNOSIS — Z6841 Body Mass Index (BMI) 40.0 and over, adult: Secondary | ICD-10-CM | POA: Diagnosis not present

## 2013-09-14 DIAGNOSIS — G459 Transient cerebral ischemic attack, unspecified: Secondary | ICD-10-CM | POA: Diagnosis not present

## 2013-09-14 DIAGNOSIS — R4701 Aphasia: Secondary | ICD-10-CM | POA: Diagnosis not present

## 2013-09-14 DIAGNOSIS — Z5189 Encounter for other specified aftercare: Secondary | ICD-10-CM | POA: Diagnosis not present

## 2013-09-14 DIAGNOSIS — R279 Unspecified lack of coordination: Secondary | ICD-10-CM | POA: Diagnosis not present

## 2013-09-14 DIAGNOSIS — I6990 Unspecified sequelae of unspecified cerebrovascular disease: Secondary | ICD-10-CM | POA: Diagnosis not present

## 2013-09-14 DIAGNOSIS — I6789 Other cerebrovascular disease: Secondary | ICD-10-CM | POA: Diagnosis not present

## 2013-09-14 DIAGNOSIS — Z7982 Long term (current) use of aspirin: Secondary | ICD-10-CM | POA: Diagnosis not present

## 2013-09-14 DIAGNOSIS — I6350 Cerebral infarction due to unspecified occlusion or stenosis of unspecified cerebral artery: Secondary | ICD-10-CM | POA: Diagnosis not present

## 2013-09-14 DIAGNOSIS — I4892 Unspecified atrial flutter: Secondary | ICD-10-CM | POA: Diagnosis present

## 2013-09-14 DIAGNOSIS — I69920 Aphasia following unspecified cerebrovascular disease: Secondary | ICD-10-CM | POA: Diagnosis not present

## 2013-09-14 DIAGNOSIS — I69959 Hemiplegia and hemiparesis following unspecified cerebrovascular disease affecting unspecified side: Secondary | ICD-10-CM | POA: Diagnosis not present

## 2013-09-14 DIAGNOSIS — M6281 Muscle weakness (generalized): Secondary | ICD-10-CM | POA: Diagnosis not present

## 2013-09-14 DIAGNOSIS — R4789 Other speech disturbances: Secondary | ICD-10-CM | POA: Diagnosis not present

## 2013-09-14 DIAGNOSIS — I059 Rheumatic mitral valve disease, unspecified: Secondary | ICD-10-CM | POA: Diagnosis not present

## 2013-09-14 DIAGNOSIS — I498 Other specified cardiac arrhythmias: Secondary | ICD-10-CM | POA: Diagnosis present

## 2013-09-14 DIAGNOSIS — Z23 Encounter for immunization: Secondary | ICD-10-CM | POA: Diagnosis not present

## 2013-09-14 DIAGNOSIS — E039 Hypothyroidism, unspecified: Secondary | ICD-10-CM | POA: Diagnosis present

## 2013-09-14 DIAGNOSIS — Z79899 Other long term (current) drug therapy: Secondary | ICD-10-CM | POA: Diagnosis not present

## 2013-09-14 DIAGNOSIS — Z96659 Presence of unspecified artificial knee joint: Secondary | ICD-10-CM | POA: Diagnosis not present

## 2013-09-14 DIAGNOSIS — R4182 Altered mental status, unspecified: Secondary | ICD-10-CM | POA: Diagnosis not present

## 2013-09-14 DIAGNOSIS — I509 Heart failure, unspecified: Secondary | ICD-10-CM | POA: Diagnosis not present

## 2013-09-17 DIAGNOSIS — R4701 Aphasia: Secondary | ICD-10-CM | POA: Diagnosis not present

## 2013-09-17 DIAGNOSIS — I69959 Hemiplegia and hemiparesis following unspecified cerebrovascular disease affecting unspecified side: Secondary | ICD-10-CM | POA: Diagnosis not present

## 2013-09-17 DIAGNOSIS — I6350 Cerebral infarction due to unspecified occlusion or stenosis of unspecified cerebral artery: Secondary | ICD-10-CM | POA: Diagnosis not present

## 2013-09-17 DIAGNOSIS — I69991 Dysphagia following unspecified cerebrovascular disease: Secondary | ICD-10-CM | POA: Diagnosis not present

## 2013-09-17 DIAGNOSIS — I69920 Aphasia following unspecified cerebrovascular disease: Secondary | ICD-10-CM | POA: Diagnosis not present

## 2013-09-17 DIAGNOSIS — I498 Other specified cardiac arrhythmias: Secondary | ICD-10-CM | POA: Diagnosis not present

## 2013-09-17 DIAGNOSIS — I4891 Unspecified atrial fibrillation: Secondary | ICD-10-CM | POA: Diagnosis not present

## 2013-09-17 DIAGNOSIS — Z79899 Other long term (current) drug therapy: Secondary | ICD-10-CM | POA: Diagnosis not present

## 2013-09-17 DIAGNOSIS — M6281 Muscle weakness (generalized): Secondary | ICD-10-CM | POA: Diagnosis not present

## 2013-09-17 DIAGNOSIS — I6990 Unspecified sequelae of unspecified cerebrovascular disease: Secondary | ICD-10-CM | POA: Diagnosis not present

## 2013-09-17 DIAGNOSIS — Z7982 Long term (current) use of aspirin: Secondary | ICD-10-CM | POA: Diagnosis not present

## 2013-09-17 DIAGNOSIS — E11359 Type 2 diabetes mellitus with proliferative diabetic retinopathy without macular edema: Secondary | ICD-10-CM | POA: Diagnosis not present

## 2013-09-17 DIAGNOSIS — Z23 Encounter for immunization: Secondary | ICD-10-CM | POA: Diagnosis not present

## 2013-09-17 DIAGNOSIS — E785 Hyperlipidemia, unspecified: Secondary | ICD-10-CM | POA: Diagnosis not present

## 2013-09-17 DIAGNOSIS — E039 Hypothyroidism, unspecified: Secondary | ICD-10-CM | POA: Diagnosis not present

## 2013-09-17 DIAGNOSIS — Z9884 Bariatric surgery status: Secondary | ICD-10-CM | POA: Diagnosis not present

## 2013-09-17 DIAGNOSIS — I130 Hypertensive heart and chronic kidney disease with heart failure and stage 1 through stage 4 chronic kidney disease, or unspecified chronic kidney disease: Secondary | ICD-10-CM | POA: Diagnosis not present

## 2013-09-17 DIAGNOSIS — I509 Heart failure, unspecified: Secondary | ICD-10-CM | POA: Diagnosis not present

## 2013-09-17 DIAGNOSIS — H348392 Tributary (branch) retinal vein occlusion, unspecified eye, stable: Secondary | ICD-10-CM | POA: Diagnosis not present

## 2013-09-17 DIAGNOSIS — R269 Unspecified abnormalities of gait and mobility: Secondary | ICD-10-CM | POA: Diagnosis not present

## 2013-09-17 DIAGNOSIS — I10 Essential (primary) hypertension: Secondary | ICD-10-CM | POA: Diagnosis not present

## 2013-09-17 DIAGNOSIS — E1159 Type 2 diabetes mellitus with other circulatory complications: Secondary | ICD-10-CM | POA: Diagnosis not present

## 2013-09-17 DIAGNOSIS — I4892 Unspecified atrial flutter: Secondary | ICD-10-CM | POA: Diagnosis not present

## 2013-09-17 DIAGNOSIS — Z96659 Presence of unspecified artificial knee joint: Secondary | ICD-10-CM | POA: Diagnosis not present

## 2013-09-17 DIAGNOSIS — E11311 Type 2 diabetes mellitus with unspecified diabetic retinopathy with macular edema: Secondary | ICD-10-CM | POA: Diagnosis not present

## 2013-09-17 DIAGNOSIS — D649 Anemia, unspecified: Secondary | ICD-10-CM | POA: Diagnosis not present

## 2013-09-17 DIAGNOSIS — Z6841 Body Mass Index (BMI) 40.0 and over, adult: Secondary | ICD-10-CM | POA: Diagnosis not present

## 2013-09-17 DIAGNOSIS — E119 Type 2 diabetes mellitus without complications: Secondary | ICD-10-CM | POA: Diagnosis not present

## 2013-09-17 DIAGNOSIS — Z5189 Encounter for other specified aftercare: Secondary | ICD-10-CM | POA: Diagnosis not present

## 2013-09-17 DIAGNOSIS — E1039 Type 1 diabetes mellitus with other diabetic ophthalmic complication: Secondary | ICD-10-CM | POA: Diagnosis not present

## 2013-09-17 DIAGNOSIS — R279 Unspecified lack of coordination: Secondary | ICD-10-CM | POA: Diagnosis not present

## 2013-09-22 DIAGNOSIS — I6350 Cerebral infarction due to unspecified occlusion or stenosis of unspecified cerebral artery: Secondary | ICD-10-CM | POA: Diagnosis not present

## 2013-09-22 DIAGNOSIS — I130 Hypertensive heart and chronic kidney disease with heart failure and stage 1 through stage 4 chronic kidney disease, or unspecified chronic kidney disease: Secondary | ICD-10-CM | POA: Diagnosis not present

## 2013-09-22 DIAGNOSIS — E1159 Type 2 diabetes mellitus with other circulatory complications: Secondary | ICD-10-CM | POA: Diagnosis not present

## 2013-09-22 DIAGNOSIS — N039 Chronic nephritic syndrome with unspecified morphologic changes: Secondary | ICD-10-CM | POA: Diagnosis not present

## 2013-09-22 DIAGNOSIS — I69991 Dysphagia following unspecified cerebrovascular disease: Secondary | ICD-10-CM | POA: Diagnosis not present

## 2013-09-29 DIAGNOSIS — E11359 Type 2 diabetes mellitus with proliferative diabetic retinopathy without macular edema: Secondary | ICD-10-CM | POA: Diagnosis not present

## 2013-09-29 DIAGNOSIS — H348392 Tributary (branch) retinal vein occlusion, unspecified eye, stable: Secondary | ICD-10-CM | POA: Diagnosis not present

## 2013-09-29 DIAGNOSIS — E1039 Type 1 diabetes mellitus with other diabetic ophthalmic complication: Secondary | ICD-10-CM | POA: Diagnosis not present

## 2013-09-29 DIAGNOSIS — E11311 Type 2 diabetes mellitus with unspecified diabetic retinopathy with macular edema: Secondary | ICD-10-CM | POA: Diagnosis not present

## 2013-10-22 DIAGNOSIS — F3289 Other specified depressive episodes: Secondary | ICD-10-CM | POA: Diagnosis not present

## 2013-10-22 DIAGNOSIS — E119 Type 2 diabetes mellitus without complications: Secondary | ICD-10-CM | POA: Diagnosis not present

## 2013-10-22 DIAGNOSIS — I69920 Aphasia following unspecified cerebrovascular disease: Secondary | ICD-10-CM | POA: Diagnosis not present

## 2013-10-22 DIAGNOSIS — G4733 Obstructive sleep apnea (adult) (pediatric): Secondary | ICD-10-CM | POA: Diagnosis not present

## 2013-10-22 DIAGNOSIS — I10 Essential (primary) hypertension: Secondary | ICD-10-CM | POA: Diagnosis not present

## 2013-10-22 DIAGNOSIS — I509 Heart failure, unspecified: Secondary | ICD-10-CM | POA: Diagnosis not present

## 2013-10-31 DIAGNOSIS — E119 Type 2 diabetes mellitus without complications: Secondary | ICD-10-CM | POA: Diagnosis not present

## 2013-10-31 DIAGNOSIS — I509 Heart failure, unspecified: Secondary | ICD-10-CM | POA: Diagnosis not present

## 2013-11-01 DIAGNOSIS — I4891 Unspecified atrial fibrillation: Secondary | ICD-10-CM | POA: Diagnosis not present

## 2013-11-01 DIAGNOSIS — N183 Chronic kidney disease, stage 3 unspecified: Secondary | ICD-10-CM | POA: Diagnosis not present

## 2013-11-01 DIAGNOSIS — I129 Hypertensive chronic kidney disease with stage 1 through stage 4 chronic kidney disease, or unspecified chronic kidney disease: Secondary | ICD-10-CM | POA: Diagnosis not present

## 2013-11-01 DIAGNOSIS — I6990 Unspecified sequelae of unspecified cerebrovascular disease: Secondary | ICD-10-CM | POA: Diagnosis not present

## 2013-11-01 DIAGNOSIS — E1129 Type 2 diabetes mellitus with other diabetic kidney complication: Secondary | ICD-10-CM | POA: Diagnosis not present

## 2013-11-01 DIAGNOSIS — E538 Deficiency of other specified B group vitamins: Secondary | ICD-10-CM | POA: Diagnosis not present

## 2013-11-10 DIAGNOSIS — H348392 Tributary (branch) retinal vein occlusion, unspecified eye, stable: Secondary | ICD-10-CM | POA: Diagnosis not present

## 2013-11-10 DIAGNOSIS — E11311 Type 2 diabetes mellitus with unspecified diabetic retinopathy with macular edema: Secondary | ICD-10-CM | POA: Diagnosis not present

## 2013-11-10 DIAGNOSIS — E1039 Type 1 diabetes mellitus with other diabetic ophthalmic complication: Secondary | ICD-10-CM | POA: Diagnosis not present

## 2013-11-10 DIAGNOSIS — H3581 Retinal edema: Secondary | ICD-10-CM | POA: Diagnosis not present

## 2013-11-15 DIAGNOSIS — I4892 Unspecified atrial flutter: Secondary | ICD-10-CM | POA: Diagnosis not present

## 2013-11-15 DIAGNOSIS — I10 Essential (primary) hypertension: Secondary | ICD-10-CM | POA: Diagnosis not present

## 2013-11-15 DIAGNOSIS — E119 Type 2 diabetes mellitus without complications: Secondary | ICD-10-CM | POA: Diagnosis not present

## 2013-11-15 DIAGNOSIS — E7800 Pure hypercholesterolemia, unspecified: Secondary | ICD-10-CM | POA: Diagnosis not present

## 2013-11-15 DIAGNOSIS — E1129 Type 2 diabetes mellitus with other diabetic kidney complication: Secondary | ICD-10-CM | POA: Diagnosis not present

## 2013-11-15 DIAGNOSIS — I2510 Atherosclerotic heart disease of native coronary artery without angina pectoris: Secondary | ICD-10-CM | POA: Diagnosis not present

## 2013-11-15 DIAGNOSIS — Z7901 Long term (current) use of anticoagulants: Secondary | ICD-10-CM | POA: Diagnosis not present

## 2013-11-18 DIAGNOSIS — E038 Other specified hypothyroidism: Secondary | ICD-10-CM | POA: Diagnosis not present

## 2013-11-18 DIAGNOSIS — I10 Essential (primary) hypertension: Secondary | ICD-10-CM | POA: Diagnosis not present

## 2013-11-18 DIAGNOSIS — E1149 Type 2 diabetes mellitus with other diabetic neurological complication: Secondary | ICD-10-CM | POA: Diagnosis not present

## 2013-11-18 DIAGNOSIS — IMO0001 Reserved for inherently not codable concepts without codable children: Secondary | ICD-10-CM | POA: Diagnosis not present

## 2013-11-18 DIAGNOSIS — E7800 Pure hypercholesterolemia, unspecified: Secondary | ICD-10-CM | POA: Diagnosis not present

## 2013-11-18 DIAGNOSIS — E1129 Type 2 diabetes mellitus with other diabetic kidney complication: Secondary | ICD-10-CM | POA: Diagnosis not present

## 2013-11-18 DIAGNOSIS — Z79899 Other long term (current) drug therapy: Secondary | ICD-10-CM | POA: Diagnosis not present

## 2013-11-29 DIAGNOSIS — M2040 Other hammer toe(s) (acquired), unspecified foot: Secondary | ICD-10-CM | POA: Diagnosis not present

## 2013-11-29 DIAGNOSIS — E539 Vitamin B deficiency, unspecified: Secondary | ICD-10-CM

## 2013-11-29 DIAGNOSIS — I6350 Cerebral infarction due to unspecified occlusion or stenosis of unspecified cerebral artery: Secondary | ICD-10-CM | POA: Diagnosis not present

## 2013-11-29 DIAGNOSIS — G4730 Sleep apnea, unspecified: Secondary | ICD-10-CM

## 2013-11-29 DIAGNOSIS — E11319 Type 2 diabetes mellitus with unspecified diabetic retinopathy without macular edema: Secondary | ICD-10-CM | POA: Diagnosis not present

## 2013-11-29 DIAGNOSIS — G4733 Obstructive sleep apnea (adult) (pediatric): Secondary | ICD-10-CM | POA: Diagnosis not present

## 2013-11-29 DIAGNOSIS — L84 Corns and callosities: Secondary | ICD-10-CM | POA: Diagnosis not present

## 2013-11-29 DIAGNOSIS — Z6841 Body Mass Index (BMI) 40.0 and over, adult: Secondary | ICD-10-CM | POA: Diagnosis not present

## 2013-11-29 DIAGNOSIS — E1149 Type 2 diabetes mellitus with other diabetic neurological complication: Secondary | ICD-10-CM | POA: Diagnosis not present

## 2013-11-29 DIAGNOSIS — M1990 Unspecified osteoarthritis, unspecified site: Secondary | ICD-10-CM

## 2013-11-29 DIAGNOSIS — E079 Disorder of thyroid, unspecified: Secondary | ICD-10-CM | POA: Insufficient documentation

## 2013-11-29 DIAGNOSIS — E039 Hypothyroidism, unspecified: Secondary | ICD-10-CM | POA: Insufficient documentation

## 2013-11-29 DIAGNOSIS — E1139 Type 2 diabetes mellitus with other diabetic ophthalmic complication: Secondary | ICD-10-CM

## 2013-11-29 DIAGNOSIS — E1142 Type 2 diabetes mellitus with diabetic polyneuropathy: Secondary | ICD-10-CM | POA: Diagnosis not present

## 2013-11-29 HISTORY — DX: Vitamin B deficiency, unspecified: E53.9

## 2013-11-29 HISTORY — DX: Sleep apnea, unspecified: G47.30

## 2013-11-29 HISTORY — DX: Type 2 diabetes mellitus with other diabetic ophthalmic complication: E11.39

## 2013-11-29 HISTORY — DX: Unspecified osteoarthritis, unspecified site: M19.90

## 2013-11-29 HISTORY — DX: Disorder of thyroid, unspecified: E07.9

## 2013-12-04 DIAGNOSIS — I6350 Cerebral infarction due to unspecified occlusion or stenosis of unspecified cerebral artery: Secondary | ICD-10-CM

## 2013-12-04 HISTORY — DX: Cerebral infarction due to unspecified occlusion or stenosis of unspecified cerebral artery: I63.50

## 2013-12-16 DIAGNOSIS — I6990 Unspecified sequelae of unspecified cerebrovascular disease: Secondary | ICD-10-CM | POA: Diagnosis not present

## 2013-12-16 DIAGNOSIS — E538 Deficiency of other specified B group vitamins: Secondary | ICD-10-CM | POA: Diagnosis not present

## 2013-12-16 DIAGNOSIS — I10 Essential (primary) hypertension: Secondary | ICD-10-CM | POA: Diagnosis not present

## 2013-12-16 DIAGNOSIS — E1129 Type 2 diabetes mellitus with other diabetic kidney complication: Secondary | ICD-10-CM | POA: Diagnosis not present

## 2013-12-21 DIAGNOSIS — I69920 Aphasia following unspecified cerebrovascular disease: Secondary | ICD-10-CM | POA: Diagnosis not present

## 2013-12-21 DIAGNOSIS — F3289 Other specified depressive episodes: Secondary | ICD-10-CM | POA: Diagnosis not present

## 2013-12-21 DIAGNOSIS — G4733 Obstructive sleep apnea (adult) (pediatric): Secondary | ICD-10-CM | POA: Diagnosis not present

## 2013-12-21 DIAGNOSIS — I509 Heart failure, unspecified: Secondary | ICD-10-CM | POA: Diagnosis not present

## 2013-12-21 DIAGNOSIS — E119 Type 2 diabetes mellitus without complications: Secondary | ICD-10-CM | POA: Diagnosis not present

## 2013-12-21 DIAGNOSIS — I10 Essential (primary) hypertension: Secondary | ICD-10-CM | POA: Diagnosis not present

## 2014-01-05 DIAGNOSIS — E1039 Type 1 diabetes mellitus with other diabetic ophthalmic complication: Secondary | ICD-10-CM | POA: Diagnosis not present

## 2014-01-05 DIAGNOSIS — H348392 Tributary (branch) retinal vein occlusion, unspecified eye, stable: Secondary | ICD-10-CM | POA: Diagnosis not present

## 2014-01-05 DIAGNOSIS — E11339 Type 2 diabetes mellitus with moderate nonproliferative diabetic retinopathy without macular edema: Secondary | ICD-10-CM | POA: Diagnosis not present

## 2014-01-05 DIAGNOSIS — H3581 Retinal edema: Secondary | ICD-10-CM | POA: Diagnosis not present

## 2014-01-05 DIAGNOSIS — E11359 Type 2 diabetes mellitus with proliferative diabetic retinopathy without macular edema: Secondary | ICD-10-CM | POA: Diagnosis not present

## 2014-01-12 DIAGNOSIS — E119 Type 2 diabetes mellitus without complications: Secondary | ICD-10-CM | POA: Diagnosis not present

## 2014-01-12 DIAGNOSIS — I509 Heart failure, unspecified: Secondary | ICD-10-CM | POA: Diagnosis not present

## 2014-01-19 DIAGNOSIS — E11319 Type 2 diabetes mellitus with unspecified diabetic retinopathy without macular edema: Secondary | ICD-10-CM | POA: Diagnosis not present

## 2014-01-19 DIAGNOSIS — H524 Presbyopia: Secondary | ICD-10-CM | POA: Diagnosis not present

## 2014-01-24 DIAGNOSIS — I4891 Unspecified atrial fibrillation: Secondary | ICD-10-CM | POA: Diagnosis not present

## 2014-01-24 DIAGNOSIS — G4733 Obstructive sleep apnea (adult) (pediatric): Secondary | ICD-10-CM

## 2014-01-24 DIAGNOSIS — I639 Cerebral infarction, unspecified: Secondary | ICD-10-CM

## 2014-01-24 DIAGNOSIS — I4892 Unspecified atrial flutter: Secondary | ICD-10-CM

## 2014-01-24 DIAGNOSIS — I6350 Cerebral infarction due to unspecified occlusion or stenosis of unspecified cerebral artery: Secondary | ICD-10-CM | POA: Diagnosis not present

## 2014-01-24 HISTORY — DX: Unspecified atrial flutter: I48.92

## 2014-01-24 HISTORY — DX: Cerebral infarction, unspecified: I63.9

## 2014-01-24 HISTORY — DX: Obstructive sleep apnea (adult) (pediatric): G47.33

## 2014-02-19 DIAGNOSIS — IMO0001 Reserved for inherently not codable concepts without codable children: Secondary | ICD-10-CM | POA: Diagnosis not present

## 2014-02-19 DIAGNOSIS — E119 Type 2 diabetes mellitus without complications: Secondary | ICD-10-CM | POA: Diagnosis not present

## 2014-02-19 DIAGNOSIS — I69920 Aphasia following unspecified cerebrovascular disease: Secondary | ICD-10-CM | POA: Diagnosis not present

## 2014-02-20 DIAGNOSIS — E7800 Pure hypercholesterolemia, unspecified: Secondary | ICD-10-CM | POA: Diagnosis not present

## 2014-02-20 DIAGNOSIS — Z79899 Other long term (current) drug therapy: Secondary | ICD-10-CM | POA: Diagnosis not present

## 2014-02-20 DIAGNOSIS — E038 Other specified hypothyroidism: Secondary | ICD-10-CM | POA: Diagnosis not present

## 2014-02-20 DIAGNOSIS — I10 Essential (primary) hypertension: Secondary | ICD-10-CM | POA: Diagnosis not present

## 2014-02-20 DIAGNOSIS — E538 Deficiency of other specified B group vitamins: Secondary | ICD-10-CM | POA: Diagnosis not present

## 2014-02-20 DIAGNOSIS — E1149 Type 2 diabetes mellitus with other diabetic neurological complication: Secondary | ICD-10-CM | POA: Diagnosis not present

## 2014-02-20 DIAGNOSIS — I6990 Unspecified sequelae of unspecified cerebrovascular disease: Secondary | ICD-10-CM | POA: Diagnosis not present

## 2014-02-21 DIAGNOSIS — I69920 Aphasia following unspecified cerebrovascular disease: Secondary | ICD-10-CM | POA: Diagnosis not present

## 2014-02-21 DIAGNOSIS — IMO0001 Reserved for inherently not codable concepts without codable children: Secondary | ICD-10-CM | POA: Diagnosis not present

## 2014-02-21 DIAGNOSIS — E119 Type 2 diabetes mellitus without complications: Secondary | ICD-10-CM | POA: Diagnosis not present

## 2014-02-24 DIAGNOSIS — I69920 Aphasia following unspecified cerebrovascular disease: Secondary | ICD-10-CM | POA: Diagnosis not present

## 2014-02-24 DIAGNOSIS — IMO0001 Reserved for inherently not codable concepts without codable children: Secondary | ICD-10-CM | POA: Diagnosis not present

## 2014-02-24 DIAGNOSIS — E119 Type 2 diabetes mellitus without complications: Secondary | ICD-10-CM | POA: Diagnosis not present

## 2014-02-28 DIAGNOSIS — Z0289 Encounter for other administrative examinations: Secondary | ICD-10-CM | POA: Diagnosis not present

## 2014-03-01 DIAGNOSIS — IMO0001 Reserved for inherently not codable concepts without codable children: Secondary | ICD-10-CM | POA: Diagnosis not present

## 2014-03-01 DIAGNOSIS — E119 Type 2 diabetes mellitus without complications: Secondary | ICD-10-CM | POA: Diagnosis not present

## 2014-03-01 DIAGNOSIS — I69920 Aphasia following unspecified cerebrovascular disease: Secondary | ICD-10-CM | POA: Diagnosis not present

## 2014-03-02 DIAGNOSIS — IMO0001 Reserved for inherently not codable concepts without codable children: Secondary | ICD-10-CM | POA: Diagnosis not present

## 2014-03-02 DIAGNOSIS — E119 Type 2 diabetes mellitus without complications: Secondary | ICD-10-CM | POA: Diagnosis not present

## 2014-03-02 DIAGNOSIS — I69920 Aphasia following unspecified cerebrovascular disease: Secondary | ICD-10-CM | POA: Diagnosis not present

## 2014-03-10 DIAGNOSIS — I69920 Aphasia following unspecified cerebrovascular disease: Secondary | ICD-10-CM | POA: Diagnosis not present

## 2014-03-10 DIAGNOSIS — E119 Type 2 diabetes mellitus without complications: Secondary | ICD-10-CM | POA: Diagnosis not present

## 2014-03-10 DIAGNOSIS — IMO0001 Reserved for inherently not codable concepts without codable children: Secondary | ICD-10-CM | POA: Diagnosis not present

## 2014-03-17 DIAGNOSIS — E119 Type 2 diabetes mellitus without complications: Secondary | ICD-10-CM | POA: Diagnosis not present

## 2014-03-17 DIAGNOSIS — I69920 Aphasia following unspecified cerebrovascular disease: Secondary | ICD-10-CM | POA: Diagnosis not present

## 2014-03-17 DIAGNOSIS — IMO0001 Reserved for inherently not codable concepts without codable children: Secondary | ICD-10-CM | POA: Diagnosis not present

## 2014-03-20 DIAGNOSIS — I509 Heart failure, unspecified: Secondary | ICD-10-CM | POA: Diagnosis not present

## 2014-03-20 DIAGNOSIS — E119 Type 2 diabetes mellitus without complications: Secondary | ICD-10-CM | POA: Diagnosis not present

## 2014-03-24 DIAGNOSIS — IMO0001 Reserved for inherently not codable concepts without codable children: Secondary | ICD-10-CM | POA: Diagnosis not present

## 2014-03-24 DIAGNOSIS — I69920 Aphasia following unspecified cerebrovascular disease: Secondary | ICD-10-CM | POA: Diagnosis not present

## 2014-03-24 DIAGNOSIS — E119 Type 2 diabetes mellitus without complications: Secondary | ICD-10-CM | POA: Diagnosis not present

## 2014-03-31 DIAGNOSIS — IMO0001 Reserved for inherently not codable concepts without codable children: Secondary | ICD-10-CM | POA: Diagnosis not present

## 2014-03-31 DIAGNOSIS — I69920 Aphasia following unspecified cerebrovascular disease: Secondary | ICD-10-CM | POA: Diagnosis not present

## 2014-03-31 DIAGNOSIS — E119 Type 2 diabetes mellitus without complications: Secondary | ICD-10-CM | POA: Diagnosis not present

## 2014-04-24 DIAGNOSIS — I509 Heart failure, unspecified: Secondary | ICD-10-CM | POA: Diagnosis not present

## 2014-04-24 DIAGNOSIS — E119 Type 2 diabetes mellitus without complications: Secondary | ICD-10-CM | POA: Diagnosis not present

## 2014-04-27 DIAGNOSIS — H3581 Retinal edema: Secondary | ICD-10-CM | POA: Diagnosis not present

## 2014-04-27 DIAGNOSIS — E1039 Type 1 diabetes mellitus with other diabetic ophthalmic complication: Secondary | ICD-10-CM | POA: Diagnosis not present

## 2014-04-27 DIAGNOSIS — E11359 Type 2 diabetes mellitus with proliferative diabetic retinopathy without macular edema: Secondary | ICD-10-CM | POA: Diagnosis not present

## 2014-04-27 DIAGNOSIS — H348392 Tributary (branch) retinal vein occlusion, unspecified eye, stable: Secondary | ICD-10-CM | POA: Diagnosis not present

## 2014-05-22 DIAGNOSIS — I129 Hypertensive chronic kidney disease with stage 1 through stage 4 chronic kidney disease, or unspecified chronic kidney disease: Secondary | ICD-10-CM | POA: Diagnosis not present

## 2014-05-22 DIAGNOSIS — E038 Other specified hypothyroidism: Secondary | ICD-10-CM | POA: Diagnosis not present

## 2014-05-22 DIAGNOSIS — Z23 Encounter for immunization: Secondary | ICD-10-CM | POA: Diagnosis not present

## 2014-05-22 DIAGNOSIS — N183 Chronic kidney disease, stage 3 unspecified: Secondary | ICD-10-CM | POA: Diagnosis not present

## 2014-05-22 DIAGNOSIS — R4701 Aphasia: Secondary | ICD-10-CM | POA: Diagnosis not present

## 2014-05-22 DIAGNOSIS — I10 Essential (primary) hypertension: Secondary | ICD-10-CM | POA: Diagnosis not present

## 2014-05-22 DIAGNOSIS — Z0000 Encounter for general adult medical examination without abnormal findings: Secondary | ICD-10-CM | POA: Diagnosis not present

## 2014-05-22 DIAGNOSIS — E7800 Pure hypercholesterolemia, unspecified: Secondary | ICD-10-CM | POA: Diagnosis not present

## 2014-05-22 DIAGNOSIS — E1139 Type 2 diabetes mellitus with other diabetic ophthalmic complication: Secondary | ICD-10-CM | POA: Diagnosis not present

## 2014-05-30 DIAGNOSIS — Z1231 Encounter for screening mammogram for malignant neoplasm of breast: Secondary | ICD-10-CM | POA: Diagnosis not present

## 2014-06-02 DIAGNOSIS — Z23 Encounter for immunization: Secondary | ICD-10-CM | POA: Diagnosis not present

## 2014-06-02 DIAGNOSIS — E538 Deficiency of other specified B group vitamins: Secondary | ICD-10-CM | POA: Diagnosis not present

## 2014-07-27 DIAGNOSIS — E11331 Type 2 diabetes mellitus with moderate nonproliferative diabetic retinopathy with macular edema: Secondary | ICD-10-CM | POA: Diagnosis not present

## 2014-07-27 DIAGNOSIS — H34832 Tributary (branch) retinal vein occlusion, left eye: Secondary | ICD-10-CM | POA: Diagnosis not present

## 2014-07-27 DIAGNOSIS — E11351 Type 2 diabetes mellitus with proliferative diabetic retinopathy with macular edema: Secondary | ICD-10-CM | POA: Diagnosis not present

## 2014-07-27 DIAGNOSIS — H3581 Retinal edema: Secondary | ICD-10-CM | POA: Diagnosis not present

## 2014-08-08 DIAGNOSIS — D492 Neoplasm of unspecified behavior of bone, soft tissue, and skin: Secondary | ICD-10-CM | POA: Diagnosis not present

## 2014-08-21 DIAGNOSIS — I10 Essential (primary) hypertension: Secondary | ICD-10-CM | POA: Diagnosis not present

## 2014-08-21 DIAGNOSIS — E559 Vitamin D deficiency, unspecified: Secondary | ICD-10-CM | POA: Diagnosis not present

## 2014-08-21 DIAGNOSIS — I6990 Unspecified sequelae of unspecified cerebrovascular disease: Secondary | ICD-10-CM | POA: Diagnosis not present

## 2014-08-21 DIAGNOSIS — E780 Pure hypercholesterolemia: Secondary | ICD-10-CM | POA: Diagnosis not present

## 2014-08-21 DIAGNOSIS — N183 Chronic kidney disease, stage 3 (moderate): Secondary | ICD-10-CM | POA: Diagnosis not present

## 2014-08-21 DIAGNOSIS — E538 Deficiency of other specified B group vitamins: Secondary | ICD-10-CM | POA: Diagnosis not present

## 2014-08-21 DIAGNOSIS — E1140 Type 2 diabetes mellitus with diabetic neuropathy, unspecified: Secondary | ICD-10-CM | POA: Diagnosis not present

## 2014-08-21 DIAGNOSIS — E038 Other specified hypothyroidism: Secondary | ICD-10-CM | POA: Diagnosis not present

## 2014-10-09 DIAGNOSIS — J069 Acute upper respiratory infection, unspecified: Secondary | ICD-10-CM | POA: Diagnosis not present

## 2014-10-20 DIAGNOSIS — H9209 Otalgia, unspecified ear: Secondary | ICD-10-CM | POA: Diagnosis not present

## 2014-10-25 DIAGNOSIS — E780 Pure hypercholesterolemia: Secondary | ICD-10-CM | POA: Diagnosis not present

## 2014-10-25 DIAGNOSIS — E538 Deficiency of other specified B group vitamins: Secondary | ICD-10-CM | POA: Diagnosis not present

## 2014-10-25 DIAGNOSIS — E559 Vitamin D deficiency, unspecified: Secondary | ICD-10-CM | POA: Diagnosis not present

## 2014-10-25 DIAGNOSIS — I129 Hypertensive chronic kidney disease with stage 1 through stage 4 chronic kidney disease, or unspecified chronic kidney disease: Secondary | ICD-10-CM | POA: Diagnosis not present

## 2014-10-25 DIAGNOSIS — I6990 Unspecified sequelae of unspecified cerebrovascular disease: Secondary | ICD-10-CM | POA: Diagnosis not present

## 2014-10-25 DIAGNOSIS — I10 Essential (primary) hypertension: Secondary | ICD-10-CM | POA: Diagnosis not present

## 2014-10-25 DIAGNOSIS — I4891 Unspecified atrial fibrillation: Secondary | ICD-10-CM | POA: Diagnosis not present

## 2014-10-25 DIAGNOSIS — E038 Other specified hypothyroidism: Secondary | ICD-10-CM | POA: Diagnosis not present

## 2014-10-25 DIAGNOSIS — E11319 Type 2 diabetes mellitus with unspecified diabetic retinopathy without macular edema: Secondary | ICD-10-CM | POA: Diagnosis not present

## 2014-10-25 DIAGNOSIS — E1129 Type 2 diabetes mellitus with other diabetic kidney complication: Secondary | ICD-10-CM | POA: Diagnosis not present

## 2014-11-17 DIAGNOSIS — I483 Typical atrial flutter: Secondary | ICD-10-CM | POA: Diagnosis not present

## 2014-11-17 DIAGNOSIS — I10 Essential (primary) hypertension: Secondary | ICD-10-CM | POA: Diagnosis not present

## 2014-11-17 DIAGNOSIS — Z7901 Long term (current) use of anticoagulants: Secondary | ICD-10-CM | POA: Diagnosis not present

## 2014-11-22 DIAGNOSIS — M545 Low back pain: Secondary | ICD-10-CM | POA: Diagnosis not present

## 2014-11-22 DIAGNOSIS — M25561 Pain in right knee: Secondary | ICD-10-CM | POA: Diagnosis not present

## 2014-11-22 DIAGNOSIS — Z9181 History of falling: Secondary | ICD-10-CM | POA: Diagnosis not present

## 2014-12-05 DIAGNOSIS — R002 Palpitations: Secondary | ICD-10-CM | POA: Diagnosis not present

## 2014-12-07 DIAGNOSIS — I4891 Unspecified atrial fibrillation: Secondary | ICD-10-CM | POA: Diagnosis not present

## 2014-12-07 DIAGNOSIS — R002 Palpitations: Secondary | ICD-10-CM | POA: Diagnosis not present

## 2014-12-11 DIAGNOSIS — E11359 Type 2 diabetes mellitus with proliferative diabetic retinopathy without macular edema: Secondary | ICD-10-CM | POA: Diagnosis not present

## 2015-03-21 DIAGNOSIS — E538 Deficiency of other specified B group vitamins: Secondary | ICD-10-CM | POA: Diagnosis not present

## 2015-03-29 DIAGNOSIS — E038 Other specified hypothyroidism: Secondary | ICD-10-CM | POA: Diagnosis not present

## 2015-03-29 DIAGNOSIS — E780 Pure hypercholesterolemia: Secondary | ICD-10-CM | POA: Diagnosis not present

## 2015-03-29 DIAGNOSIS — I10 Essential (primary) hypertension: Secondary | ICD-10-CM | POA: Diagnosis not present

## 2015-03-29 DIAGNOSIS — I129 Hypertensive chronic kidney disease with stage 1 through stage 4 chronic kidney disease, or unspecified chronic kidney disease: Secondary | ICD-10-CM | POA: Diagnosis not present

## 2015-03-29 DIAGNOSIS — E1139 Type 2 diabetes mellitus with other diabetic ophthalmic complication: Secondary | ICD-10-CM | POA: Diagnosis not present

## 2015-03-30 DIAGNOSIS — I129 Hypertensive chronic kidney disease with stage 1 through stage 4 chronic kidney disease, or unspecified chronic kidney disease: Secondary | ICD-10-CM | POA: Diagnosis not present

## 2015-03-30 DIAGNOSIS — E780 Pure hypercholesterolemia: Secondary | ICD-10-CM | POA: Diagnosis not present

## 2015-03-30 DIAGNOSIS — E038 Other specified hypothyroidism: Secondary | ICD-10-CM | POA: Diagnosis not present

## 2015-03-30 DIAGNOSIS — I10 Essential (primary) hypertension: Secondary | ICD-10-CM | POA: Diagnosis not present

## 2015-03-30 DIAGNOSIS — E1139 Type 2 diabetes mellitus with other diabetic ophthalmic complication: Secondary | ICD-10-CM | POA: Diagnosis not present

## 2015-04-23 DIAGNOSIS — S9031XA Contusion of right foot, initial encounter: Secondary | ICD-10-CM | POA: Diagnosis not present

## 2015-04-23 DIAGNOSIS — M19071 Primary osteoarthritis, right ankle and foot: Secondary | ICD-10-CM | POA: Diagnosis not present

## 2015-04-23 DIAGNOSIS — E538 Deficiency of other specified B group vitamins: Secondary | ICD-10-CM | POA: Diagnosis not present

## 2015-04-23 DIAGNOSIS — M79671 Pain in right foot: Secondary | ICD-10-CM | POA: Diagnosis not present

## 2015-06-11 DIAGNOSIS — H348321 Tributary (branch) retinal vein occlusion, left eye, with retinal neovascularization: Secondary | ICD-10-CM | POA: Diagnosis not present

## 2015-06-11 DIAGNOSIS — E113593 Type 2 diabetes mellitus with proliferative diabetic retinopathy without macular edema, bilateral: Secondary | ICD-10-CM | POA: Diagnosis not present

## 2015-06-11 DIAGNOSIS — H43813 Vitreous degeneration, bilateral: Secondary | ICD-10-CM | POA: Diagnosis not present

## 2015-07-02 DIAGNOSIS — M8580 Other specified disorders of bone density and structure, unspecified site: Secondary | ICD-10-CM | POA: Diagnosis not present

## 2015-07-02 DIAGNOSIS — E1140 Type 2 diabetes mellitus with diabetic neuropathy, unspecified: Secondary | ICD-10-CM | POA: Diagnosis not present

## 2015-07-02 DIAGNOSIS — I4892 Unspecified atrial flutter: Secondary | ICD-10-CM | POA: Diagnosis not present

## 2015-07-02 DIAGNOSIS — I10 Essential (primary) hypertension: Secondary | ICD-10-CM | POA: Diagnosis not present

## 2015-07-02 DIAGNOSIS — Z23 Encounter for immunization: Secondary | ICD-10-CM | POA: Diagnosis not present

## 2015-07-02 DIAGNOSIS — E1139 Type 2 diabetes mellitus with other diabetic ophthalmic complication: Secondary | ICD-10-CM | POA: Diagnosis not present

## 2015-07-02 DIAGNOSIS — E1129 Type 2 diabetes mellitus with other diabetic kidney complication: Secondary | ICD-10-CM | POA: Diagnosis not present

## 2015-07-02 DIAGNOSIS — E559 Vitamin D deficiency, unspecified: Secondary | ICD-10-CM | POA: Diagnosis not present

## 2015-07-02 DIAGNOSIS — Z79899 Other long term (current) drug therapy: Secondary | ICD-10-CM | POA: Diagnosis not present

## 2015-07-02 DIAGNOSIS — E7800 Pure hypercholesterolemia, unspecified: Secondary | ICD-10-CM | POA: Diagnosis not present

## 2015-07-02 DIAGNOSIS — E038 Other specified hypothyroidism: Secondary | ICD-10-CM | POA: Diagnosis not present

## 2015-07-02 DIAGNOSIS — I129 Hypertensive chronic kidney disease with stage 1 through stage 4 chronic kidney disease, or unspecified chronic kidney disease: Secondary | ICD-10-CM | POA: Diagnosis not present

## 2015-07-02 DIAGNOSIS — Z0000 Encounter for general adult medical examination without abnormal findings: Secondary | ICD-10-CM | POA: Diagnosis not present

## 2015-07-10 DIAGNOSIS — E113593 Type 2 diabetes mellitus with proliferative diabetic retinopathy without macular edema, bilateral: Secondary | ICD-10-CM | POA: Diagnosis not present

## 2015-07-10 DIAGNOSIS — H348321 Tributary (branch) retinal vein occlusion, left eye, with retinal neovascularization: Secondary | ICD-10-CM | POA: Diagnosis not present

## 2015-07-27 DIAGNOSIS — Z1231 Encounter for screening mammogram for malignant neoplasm of breast: Secondary | ICD-10-CM | POA: Diagnosis not present

## 2015-07-27 DIAGNOSIS — N959 Unspecified menopausal and perimenopausal disorder: Secondary | ICD-10-CM | POA: Diagnosis not present

## 2015-07-27 DIAGNOSIS — M8588 Other specified disorders of bone density and structure, other site: Secondary | ICD-10-CM | POA: Diagnosis not present

## 2015-08-01 DIAGNOSIS — E538 Deficiency of other specified B group vitamins: Secondary | ICD-10-CM | POA: Diagnosis not present

## 2015-08-01 DIAGNOSIS — D649 Anemia, unspecified: Secondary | ICD-10-CM | POA: Diagnosis not present

## 2015-10-02 DIAGNOSIS — E038 Other specified hypothyroidism: Secondary | ICD-10-CM | POA: Diagnosis not present

## 2015-10-02 DIAGNOSIS — D649 Anemia, unspecified: Secondary | ICD-10-CM | POA: Diagnosis not present

## 2015-10-02 DIAGNOSIS — E1140 Type 2 diabetes mellitus with diabetic neuropathy, unspecified: Secondary | ICD-10-CM | POA: Diagnosis not present

## 2015-10-02 DIAGNOSIS — E7800 Pure hypercholesterolemia, unspecified: Secondary | ICD-10-CM | POA: Diagnosis not present

## 2015-10-02 DIAGNOSIS — I10 Essential (primary) hypertension: Secondary | ICD-10-CM | POA: Diagnosis not present

## 2015-10-08 DIAGNOSIS — E113593 Type 2 diabetes mellitus with proliferative diabetic retinopathy without macular edema, bilateral: Secondary | ICD-10-CM | POA: Diagnosis not present

## 2015-10-08 DIAGNOSIS — H348321 Tributary (branch) retinal vein occlusion, left eye, with retinal neovascularization: Secondary | ICD-10-CM | POA: Diagnosis not present

## 2015-10-08 DIAGNOSIS — H4312 Vitreous hemorrhage, left eye: Secondary | ICD-10-CM | POA: Diagnosis not present

## 2015-10-19 DIAGNOSIS — Z1211 Encounter for screening for malignant neoplasm of colon: Secondary | ICD-10-CM | POA: Diagnosis not present

## 2015-10-24 DIAGNOSIS — H348321 Tributary (branch) retinal vein occlusion, left eye, with retinal neovascularization: Secondary | ICD-10-CM | POA: Diagnosis not present

## 2015-10-24 DIAGNOSIS — E113593 Type 2 diabetes mellitus with proliferative diabetic retinopathy without macular edema, bilateral: Secondary | ICD-10-CM | POA: Diagnosis not present

## 2015-10-24 DIAGNOSIS — H4312 Vitreous hemorrhage, left eye: Secondary | ICD-10-CM | POA: Diagnosis not present

## 2015-10-26 DIAGNOSIS — Z0289 Encounter for other administrative examinations: Secondary | ICD-10-CM | POA: Diagnosis not present

## 2015-10-26 DIAGNOSIS — E538 Deficiency of other specified B group vitamins: Secondary | ICD-10-CM | POA: Diagnosis not present

## 2015-11-13 DIAGNOSIS — I10 Essential (primary) hypertension: Secondary | ICD-10-CM

## 2015-11-13 DIAGNOSIS — E785 Hyperlipidemia, unspecified: Secondary | ICD-10-CM

## 2015-11-13 HISTORY — DX: Essential (primary) hypertension: I10

## 2015-11-13 HISTORY — DX: Hyperlipidemia, unspecified: E78.5

## 2015-11-19 DIAGNOSIS — I4892 Unspecified atrial flutter: Secondary | ICD-10-CM | POA: Diagnosis not present

## 2015-11-19 DIAGNOSIS — E785 Hyperlipidemia, unspecified: Secondary | ICD-10-CM | POA: Diagnosis not present

## 2015-11-19 DIAGNOSIS — I10 Essential (primary) hypertension: Secondary | ICD-10-CM | POA: Diagnosis not present

## 2015-11-19 DIAGNOSIS — I639 Cerebral infarction, unspecified: Secondary | ICD-10-CM | POA: Diagnosis not present

## 2015-12-28 DIAGNOSIS — M25421 Effusion, right elbow: Secondary | ICD-10-CM | POA: Diagnosis not present

## 2015-12-28 DIAGNOSIS — S59911A Unspecified injury of right forearm, initial encounter: Secondary | ICD-10-CM | POA: Diagnosis not present

## 2015-12-28 DIAGNOSIS — M79631 Pain in right forearm: Secondary | ICD-10-CM | POA: Diagnosis not present

## 2015-12-28 DIAGNOSIS — S52121A Displaced fracture of head of right radius, initial encounter for closed fracture: Secondary | ICD-10-CM | POA: Diagnosis not present

## 2015-12-28 DIAGNOSIS — S4991XA Unspecified injury of right shoulder and upper arm, initial encounter: Secondary | ICD-10-CM | POA: Diagnosis not present

## 2015-12-28 DIAGNOSIS — S52123A Displaced fracture of head of unspecified radius, initial encounter for closed fracture: Secondary | ICD-10-CM | POA: Diagnosis not present

## 2015-12-28 DIAGNOSIS — M25511 Pain in right shoulder: Secondary | ICD-10-CM | POA: Diagnosis not present

## 2015-12-31 DIAGNOSIS — S52124A Nondisplaced fracture of head of right radius, initial encounter for closed fracture: Secondary | ICD-10-CM | POA: Diagnosis not present

## 2016-01-02 DIAGNOSIS — Z79899 Other long term (current) drug therapy: Secondary | ICD-10-CM | POA: Diagnosis not present

## 2016-01-02 DIAGNOSIS — E113519 Type 2 diabetes mellitus with proliferative diabetic retinopathy with macular edema, unspecified eye: Secondary | ICD-10-CM | POA: Diagnosis not present

## 2016-01-02 DIAGNOSIS — E7800 Pure hypercholesterolemia, unspecified: Secondary | ICD-10-CM | POA: Diagnosis not present

## 2016-01-02 DIAGNOSIS — I10 Essential (primary) hypertension: Secondary | ICD-10-CM | POA: Diagnosis not present

## 2016-01-02 DIAGNOSIS — I6990 Unspecified sequelae of unspecified cerebrovascular disease: Secondary | ICD-10-CM | POA: Diagnosis not present

## 2016-01-02 DIAGNOSIS — E038 Other specified hypothyroidism: Secondary | ICD-10-CM | POA: Diagnosis not present

## 2016-01-02 DIAGNOSIS — E1140 Type 2 diabetes mellitus with diabetic neuropathy, unspecified: Secondary | ICD-10-CM | POA: Diagnosis not present

## 2016-01-02 DIAGNOSIS — E538 Deficiency of other specified B group vitamins: Secondary | ICD-10-CM | POA: Diagnosis not present

## 2016-01-14 DIAGNOSIS — S52124D Nondisplaced fracture of head of right radius, subsequent encounter for closed fracture with routine healing: Secondary | ICD-10-CM | POA: Diagnosis not present

## 2016-01-22 DIAGNOSIS — H348321 Tributary (branch) retinal vein occlusion, left eye, with retinal neovascularization: Secondary | ICD-10-CM | POA: Diagnosis not present

## 2016-01-22 DIAGNOSIS — H4312 Vitreous hemorrhage, left eye: Secondary | ICD-10-CM | POA: Diagnosis not present

## 2016-01-22 DIAGNOSIS — E113593 Type 2 diabetes mellitus with proliferative diabetic retinopathy without macular edema, bilateral: Secondary | ICD-10-CM | POA: Diagnosis not present

## 2016-01-22 DIAGNOSIS — H43811 Vitreous degeneration, right eye: Secondary | ICD-10-CM | POA: Diagnosis not present

## 2016-01-30 DIAGNOSIS — S52124D Nondisplaced fracture of head of right radius, subsequent encounter for closed fracture with routine healing: Secondary | ICD-10-CM | POA: Diagnosis not present

## 2016-02-06 DIAGNOSIS — Z79899 Other long term (current) drug therapy: Secondary | ICD-10-CM | POA: Diagnosis not present

## 2016-02-06 DIAGNOSIS — Z96653 Presence of artificial knee joint, bilateral: Secondary | ICD-10-CM | POA: Diagnosis not present

## 2016-02-06 DIAGNOSIS — S322XXA Fracture of coccyx, initial encounter for closed fracture: Secondary | ICD-10-CM | POA: Diagnosis not present

## 2016-02-06 DIAGNOSIS — R51 Headache: Secondary | ICD-10-CM | POA: Diagnosis not present

## 2016-02-06 DIAGNOSIS — M545 Low back pain: Secondary | ICD-10-CM | POA: Diagnosis not present

## 2016-02-06 DIAGNOSIS — S0990XA Unspecified injury of head, initial encounter: Secondary | ICD-10-CM | POA: Diagnosis not present

## 2016-02-06 DIAGNOSIS — E119 Type 2 diabetes mellitus without complications: Secondary | ICD-10-CM | POA: Diagnosis not present

## 2016-02-06 DIAGNOSIS — E039 Hypothyroidism, unspecified: Secondary | ICD-10-CM | POA: Diagnosis not present

## 2016-02-06 DIAGNOSIS — S098XXA Other specified injuries of head, initial encounter: Secondary | ICD-10-CM | POA: Diagnosis not present

## 2016-02-06 DIAGNOSIS — I10 Essential (primary) hypertension: Secondary | ICD-10-CM | POA: Diagnosis not present

## 2016-02-06 DIAGNOSIS — I4891 Unspecified atrial fibrillation: Secondary | ICD-10-CM | POA: Diagnosis not present

## 2016-02-06 DIAGNOSIS — S300XXA Contusion of lower back and pelvis, initial encounter: Secondary | ICD-10-CM | POA: Diagnosis not present

## 2016-02-06 DIAGNOSIS — Z9884 Bariatric surgery status: Secondary | ICD-10-CM | POA: Diagnosis not present

## 2016-02-06 DIAGNOSIS — Z7901 Long term (current) use of anticoagulants: Secondary | ICD-10-CM | POA: Diagnosis not present

## 2016-02-06 DIAGNOSIS — S0093XA Contusion of unspecified part of head, initial encounter: Secondary | ICD-10-CM | POA: Diagnosis not present

## 2016-02-25 DIAGNOSIS — S52124D Nondisplaced fracture of head of right radius, subsequent encounter for closed fracture with routine healing: Secondary | ICD-10-CM | POA: Diagnosis not present

## 2016-02-28 DIAGNOSIS — H4312 Vitreous hemorrhage, left eye: Secondary | ICD-10-CM | POA: Diagnosis not present

## 2016-02-28 DIAGNOSIS — H4311 Vitreous hemorrhage, right eye: Secondary | ICD-10-CM | POA: Diagnosis not present

## 2016-02-28 DIAGNOSIS — H348321 Tributary (branch) retinal vein occlusion, left eye, with retinal neovascularization: Secondary | ICD-10-CM | POA: Diagnosis not present

## 2016-04-02 DIAGNOSIS — E1129 Type 2 diabetes mellitus with other diabetic kidney complication: Secondary | ICD-10-CM | POA: Diagnosis not present

## 2016-04-02 DIAGNOSIS — E7800 Pure hypercholesterolemia, unspecified: Secondary | ICD-10-CM | POA: Diagnosis not present

## 2016-04-02 DIAGNOSIS — E1140 Type 2 diabetes mellitus with diabetic neuropathy, unspecified: Secondary | ICD-10-CM | POA: Diagnosis not present

## 2016-04-02 DIAGNOSIS — E538 Deficiency of other specified B group vitamins: Secondary | ICD-10-CM | POA: Diagnosis not present

## 2016-04-02 DIAGNOSIS — I10 Essential (primary) hypertension: Secondary | ICD-10-CM | POA: Diagnosis not present

## 2016-04-02 DIAGNOSIS — I129 Hypertensive chronic kidney disease with stage 1 through stage 4 chronic kidney disease, or unspecified chronic kidney disease: Secondary | ICD-10-CM | POA: Diagnosis not present

## 2016-04-08 DIAGNOSIS — H348321 Tributary (branch) retinal vein occlusion, left eye, with retinal neovascularization: Secondary | ICD-10-CM | POA: Diagnosis not present

## 2016-04-08 DIAGNOSIS — E113593 Type 2 diabetes mellitus with proliferative diabetic retinopathy without macular edema, bilateral: Secondary | ICD-10-CM | POA: Diagnosis not present

## 2016-05-02 ENCOUNTER — Other Ambulatory Visit: Payer: Self-pay

## 2016-07-03 DIAGNOSIS — I6990 Unspecified sequelae of unspecified cerebrovascular disease: Secondary | ICD-10-CM | POA: Diagnosis not present

## 2016-07-03 DIAGNOSIS — I129 Hypertensive chronic kidney disease with stage 1 through stage 4 chronic kidney disease, or unspecified chronic kidney disease: Secondary | ICD-10-CM | POA: Diagnosis not present

## 2016-07-03 DIAGNOSIS — E113519 Type 2 diabetes mellitus with proliferative diabetic retinopathy with macular edema, unspecified eye: Secondary | ICD-10-CM | POA: Diagnosis not present

## 2016-07-03 DIAGNOSIS — E1140 Type 2 diabetes mellitus with diabetic neuropathy, unspecified: Secondary | ICD-10-CM | POA: Diagnosis not present

## 2016-07-03 DIAGNOSIS — Z79899 Other long term (current) drug therapy: Secondary | ICD-10-CM | POA: Diagnosis not present

## 2016-07-03 DIAGNOSIS — Z0000 Encounter for general adult medical examination without abnormal findings: Secondary | ICD-10-CM | POA: Diagnosis not present

## 2016-07-03 DIAGNOSIS — E1129 Type 2 diabetes mellitus with other diabetic kidney complication: Secondary | ICD-10-CM | POA: Diagnosis not present

## 2016-07-03 DIAGNOSIS — I10 Essential (primary) hypertension: Secondary | ICD-10-CM | POA: Diagnosis not present

## 2016-07-03 DIAGNOSIS — E538 Deficiency of other specified B group vitamins: Secondary | ICD-10-CM | POA: Diagnosis not present

## 2016-07-03 DIAGNOSIS — Z1211 Encounter for screening for malignant neoplasm of colon: Secondary | ICD-10-CM | POA: Diagnosis not present

## 2016-07-03 DIAGNOSIS — E7800 Pure hypercholesterolemia, unspecified: Secondary | ICD-10-CM | POA: Diagnosis not present

## 2016-07-03 DIAGNOSIS — Z23 Encounter for immunization: Secondary | ICD-10-CM | POA: Diagnosis not present

## 2016-07-03 DIAGNOSIS — E038 Other specified hypothyroidism: Secondary | ICD-10-CM | POA: Diagnosis not present

## 2016-07-10 DIAGNOSIS — R2689 Other abnormalities of gait and mobility: Secondary | ICD-10-CM | POA: Diagnosis not present

## 2016-07-10 DIAGNOSIS — M6281 Muscle weakness (generalized): Secondary | ICD-10-CM | POA: Diagnosis not present

## 2016-07-15 DIAGNOSIS — R2689 Other abnormalities of gait and mobility: Secondary | ICD-10-CM | POA: Diagnosis not present

## 2016-07-15 DIAGNOSIS — M6281 Muscle weakness (generalized): Secondary | ICD-10-CM | POA: Diagnosis not present

## 2016-07-21 DIAGNOSIS — R2689 Other abnormalities of gait and mobility: Secondary | ICD-10-CM | POA: Diagnosis not present

## 2016-07-21 DIAGNOSIS — M6281 Muscle weakness (generalized): Secondary | ICD-10-CM | POA: Diagnosis not present

## 2016-07-28 DIAGNOSIS — M6281 Muscle weakness (generalized): Secondary | ICD-10-CM | POA: Diagnosis not present

## 2016-07-28 DIAGNOSIS — R2689 Other abnormalities of gait and mobility: Secondary | ICD-10-CM | POA: Diagnosis not present

## 2016-07-29 DIAGNOSIS — Z1231 Encounter for screening mammogram for malignant neoplasm of breast: Secondary | ICD-10-CM | POA: Diagnosis not present

## 2016-08-04 DIAGNOSIS — M6281 Muscle weakness (generalized): Secondary | ICD-10-CM | POA: Diagnosis not present

## 2016-08-04 DIAGNOSIS — R2689 Other abnormalities of gait and mobility: Secondary | ICD-10-CM | POA: Diagnosis not present

## 2016-08-07 DIAGNOSIS — M6281 Muscle weakness (generalized): Secondary | ICD-10-CM | POA: Diagnosis not present

## 2016-08-07 DIAGNOSIS — R2689 Other abnormalities of gait and mobility: Secondary | ICD-10-CM | POA: Diagnosis not present

## 2016-08-11 DIAGNOSIS — M6281 Muscle weakness (generalized): Secondary | ICD-10-CM | POA: Diagnosis not present

## 2016-08-11 DIAGNOSIS — R2689 Other abnormalities of gait and mobility: Secondary | ICD-10-CM | POA: Diagnosis not present

## 2016-08-12 DIAGNOSIS — R922 Inconclusive mammogram: Secondary | ICD-10-CM | POA: Diagnosis not present

## 2016-08-12 DIAGNOSIS — R921 Mammographic calcification found on diagnostic imaging of breast: Secondary | ICD-10-CM | POA: Diagnosis not present

## 2016-08-13 ENCOUNTER — Other Ambulatory Visit: Payer: Self-pay | Admitting: Family Medicine

## 2016-08-13 DIAGNOSIS — R920 Mammographic microcalcification found on diagnostic imaging of breast: Secondary | ICD-10-CM

## 2016-08-14 DIAGNOSIS — M6281 Muscle weakness (generalized): Secondary | ICD-10-CM | POA: Diagnosis not present

## 2016-08-14 DIAGNOSIS — R2689 Other abnormalities of gait and mobility: Secondary | ICD-10-CM | POA: Diagnosis not present

## 2016-08-18 DIAGNOSIS — M6281 Muscle weakness (generalized): Secondary | ICD-10-CM | POA: Diagnosis not present

## 2016-08-18 DIAGNOSIS — R2689 Other abnormalities of gait and mobility: Secondary | ICD-10-CM | POA: Diagnosis not present

## 2016-08-20 ENCOUNTER — Ambulatory Visit
Admission: RE | Admit: 2016-08-20 | Discharge: 2016-08-20 | Disposition: A | Discharge status: Home / Self Care | Payer: Medicare Other | Source: Ambulatory Visit | Attending: Family Medicine | Admitting: Family Medicine

## 2016-08-20 DIAGNOSIS — R920 Mammographic microcalcification found on diagnostic imaging of breast: Secondary | ICD-10-CM

## 2016-08-20 DIAGNOSIS — R921 Mammographic calcification found on diagnostic imaging of breast: Secondary | ICD-10-CM | POA: Diagnosis not present

## 2016-08-20 DIAGNOSIS — D242 Benign neoplasm of left breast: Secondary | ICD-10-CM | POA: Diagnosis not present

## 2016-08-21 DIAGNOSIS — R2689 Other abnormalities of gait and mobility: Secondary | ICD-10-CM | POA: Diagnosis not present

## 2016-08-21 DIAGNOSIS — M6281 Muscle weakness (generalized): Secondary | ICD-10-CM | POA: Diagnosis not present

## 2016-08-22 DIAGNOSIS — Z86010 Personal history of colonic polyps: Secondary | ICD-10-CM | POA: Diagnosis not present

## 2016-08-22 DIAGNOSIS — K5900 Constipation, unspecified: Secondary | ICD-10-CM | POA: Diagnosis not present

## 2016-08-22 DIAGNOSIS — Z1211 Encounter for screening for malignant neoplasm of colon: Secondary | ICD-10-CM | POA: Diagnosis not present

## 2016-08-26 DIAGNOSIS — M6281 Muscle weakness (generalized): Secondary | ICD-10-CM | POA: Diagnosis not present

## 2016-08-26 DIAGNOSIS — R2689 Other abnormalities of gait and mobility: Secondary | ICD-10-CM | POA: Diagnosis not present

## 2016-08-28 DIAGNOSIS — M6281 Muscle weakness (generalized): Secondary | ICD-10-CM | POA: Diagnosis not present

## 2016-08-28 DIAGNOSIS — R2689 Other abnormalities of gait and mobility: Secondary | ICD-10-CM | POA: Diagnosis not present

## 2016-09-02 DIAGNOSIS — R2689 Other abnormalities of gait and mobility: Secondary | ICD-10-CM | POA: Diagnosis not present

## 2016-09-02 DIAGNOSIS — M6281 Muscle weakness (generalized): Secondary | ICD-10-CM | POA: Diagnosis not present

## 2016-09-09 DIAGNOSIS — M6281 Muscle weakness (generalized): Secondary | ICD-10-CM | POA: Diagnosis not present

## 2016-09-09 DIAGNOSIS — R2689 Other abnormalities of gait and mobility: Secondary | ICD-10-CM | POA: Diagnosis not present

## 2016-09-10 DIAGNOSIS — Z7901 Long term (current) use of anticoagulants: Secondary | ICD-10-CM | POA: Diagnosis not present

## 2016-09-10 DIAGNOSIS — I484 Atypical atrial flutter: Secondary | ICD-10-CM | POA: Diagnosis not present

## 2016-09-10 DIAGNOSIS — Z6841 Body Mass Index (BMI) 40.0 and over, adult: Secondary | ICD-10-CM | POA: Diagnosis not present

## 2016-09-10 DIAGNOSIS — I10 Essential (primary) hypertension: Secondary | ICD-10-CM | POA: Diagnosis not present

## 2016-09-22 DIAGNOSIS — M6281 Muscle weakness (generalized): Secondary | ICD-10-CM | POA: Diagnosis not present

## 2016-09-22 DIAGNOSIS — R2689 Other abnormalities of gait and mobility: Secondary | ICD-10-CM | POA: Diagnosis not present

## 2016-09-29 DIAGNOSIS — R2689 Other abnormalities of gait and mobility: Secondary | ICD-10-CM | POA: Diagnosis not present

## 2016-09-29 DIAGNOSIS — E038 Other specified hypothyroidism: Secondary | ICD-10-CM | POA: Diagnosis not present

## 2016-09-29 DIAGNOSIS — I10 Essential (primary) hypertension: Secondary | ICD-10-CM | POA: Diagnosis not present

## 2016-09-29 DIAGNOSIS — E538 Deficiency of other specified B group vitamins: Secondary | ICD-10-CM | POA: Diagnosis not present

## 2016-09-29 DIAGNOSIS — G478 Other sleep disorders: Secondary | ICD-10-CM | POA: Diagnosis not present

## 2016-09-29 DIAGNOSIS — E7800 Pure hypercholesterolemia, unspecified: Secondary | ICD-10-CM | POA: Diagnosis not present

## 2016-09-29 DIAGNOSIS — E113519 Type 2 diabetes mellitus with proliferative diabetic retinopathy with macular edema, unspecified eye: Secondary | ICD-10-CM | POA: Diagnosis not present

## 2016-09-29 DIAGNOSIS — M6281 Muscle weakness (generalized): Secondary | ICD-10-CM | POA: Diagnosis not present

## 2016-10-01 DIAGNOSIS — D123 Benign neoplasm of transverse colon: Secondary | ICD-10-CM | POA: Diagnosis not present

## 2016-10-01 DIAGNOSIS — K635 Polyp of colon: Secondary | ICD-10-CM | POA: Diagnosis not present

## 2016-10-01 DIAGNOSIS — Z86010 Personal history of colonic polyps: Secondary | ICD-10-CM | POA: Diagnosis not present

## 2016-10-01 DIAGNOSIS — Z1211 Encounter for screening for malignant neoplasm of colon: Secondary | ICD-10-CM | POA: Diagnosis not present

## 2016-10-01 DIAGNOSIS — D122 Benign neoplasm of ascending colon: Secondary | ICD-10-CM | POA: Diagnosis not present

## 2016-10-01 DIAGNOSIS — K5730 Diverticulosis of large intestine without perforation or abscess without bleeding: Secondary | ICD-10-CM | POA: Diagnosis not present

## 2016-10-01 DIAGNOSIS — E119 Type 2 diabetes mellitus without complications: Secondary | ICD-10-CM | POA: Diagnosis not present

## 2016-10-01 DIAGNOSIS — D509 Iron deficiency anemia, unspecified: Secondary | ICD-10-CM | POA: Diagnosis not present

## 2016-10-09 DIAGNOSIS — R2689 Other abnormalities of gait and mobility: Secondary | ICD-10-CM | POA: Diagnosis not present

## 2016-10-09 DIAGNOSIS — M6281 Muscle weakness (generalized): Secondary | ICD-10-CM | POA: Diagnosis not present

## 2016-10-14 DIAGNOSIS — M6281 Muscle weakness (generalized): Secondary | ICD-10-CM | POA: Diagnosis not present

## 2016-10-14 DIAGNOSIS — R2689 Other abnormalities of gait and mobility: Secondary | ICD-10-CM | POA: Diagnosis not present

## 2016-10-21 DIAGNOSIS — H348321 Tributary (branch) retinal vein occlusion, left eye, with retinal neovascularization: Secondary | ICD-10-CM | POA: Diagnosis not present

## 2016-10-21 DIAGNOSIS — E113593 Type 2 diabetes mellitus with proliferative diabetic retinopathy without macular edema, bilateral: Secondary | ICD-10-CM | POA: Diagnosis not present

## 2016-10-21 DIAGNOSIS — H43811 Vitreous degeneration, right eye: Secondary | ICD-10-CM | POA: Diagnosis not present

## 2016-12-02 DIAGNOSIS — E1140 Type 2 diabetes mellitus with diabetic neuropathy, unspecified: Secondary | ICD-10-CM | POA: Diagnosis not present

## 2017-01-12 DIAGNOSIS — E113519 Type 2 diabetes mellitus with proliferative diabetic retinopathy with macular edema, unspecified eye: Secondary | ICD-10-CM | POA: Diagnosis not present

## 2017-01-12 DIAGNOSIS — I10 Essential (primary) hypertension: Secondary | ICD-10-CM | POA: Diagnosis not present

## 2017-01-12 DIAGNOSIS — E1129 Type 2 diabetes mellitus with other diabetic kidney complication: Secondary | ICD-10-CM | POA: Diagnosis not present

## 2017-01-12 DIAGNOSIS — E1140 Type 2 diabetes mellitus with diabetic neuropathy, unspecified: Secondary | ICD-10-CM | POA: Diagnosis not present

## 2017-01-12 DIAGNOSIS — E7800 Pure hypercholesterolemia, unspecified: Secondary | ICD-10-CM | POA: Diagnosis not present

## 2017-01-12 DIAGNOSIS — E038 Other specified hypothyroidism: Secondary | ICD-10-CM | POA: Diagnosis not present

## 2017-01-12 DIAGNOSIS — E538 Deficiency of other specified B group vitamins: Secondary | ICD-10-CM | POA: Diagnosis not present

## 2017-01-22 DIAGNOSIS — M2041 Other hammer toe(s) (acquired), right foot: Secondary | ICD-10-CM

## 2017-01-22 DIAGNOSIS — E1142 Type 2 diabetes mellitus with diabetic polyneuropathy: Secondary | ICD-10-CM | POA: Insufficient documentation

## 2017-01-22 DIAGNOSIS — L84 Corns and callosities: Secondary | ICD-10-CM

## 2017-01-22 DIAGNOSIS — M2042 Other hammer toe(s) (acquired), left foot: Secondary | ICD-10-CM | POA: Diagnosis not present

## 2017-01-22 HISTORY — DX: Corns and callosities: L84

## 2017-01-22 HISTORY — DX: Other hammer toe(s) (acquired), right foot: M20.41

## 2017-01-22 HISTORY — DX: Type 2 diabetes mellitus with diabetic polyneuropathy: E11.42

## 2017-01-26 DIAGNOSIS — E113293 Type 2 diabetes mellitus with mild nonproliferative diabetic retinopathy without macular edema, bilateral: Secondary | ICD-10-CM | POA: Diagnosis not present

## 2017-01-26 DIAGNOSIS — H1132 Conjunctival hemorrhage, left eye: Secondary | ICD-10-CM | POA: Diagnosis not present

## 2017-04-02 DIAGNOSIS — E538 Deficiency of other specified B group vitamins: Secondary | ICD-10-CM | POA: Diagnosis not present

## 2017-04-15 ENCOUNTER — Ambulatory Visit (INDEPENDENT_AMBULATORY_CARE_PROVIDER_SITE_OTHER): Payer: Medicare Other | Admitting: Cardiology

## 2017-04-15 VITALS — BP 134/60 | HR 67 | Ht 66.0 in | Wt 252.0 lb

## 2017-04-15 DIAGNOSIS — E7800 Pure hypercholesterolemia, unspecified: Secondary | ICD-10-CM | POA: Diagnosis not present

## 2017-04-15 DIAGNOSIS — I10 Essential (primary) hypertension: Secondary | ICD-10-CM | POA: Diagnosis not present

## 2017-04-15 DIAGNOSIS — E1129 Type 2 diabetes mellitus with other diabetic kidney complication: Secondary | ICD-10-CM | POA: Diagnosis not present

## 2017-04-15 DIAGNOSIS — Z79899 Other long term (current) drug therapy: Secondary | ICD-10-CM | POA: Diagnosis not present

## 2017-04-15 DIAGNOSIS — Z7901 Long term (current) use of anticoagulants: Secondary | ICD-10-CM

## 2017-04-15 DIAGNOSIS — E113519 Type 2 diabetes mellitus with proliferative diabetic retinopathy with macular edema, unspecified eye: Secondary | ICD-10-CM | POA: Diagnosis not present

## 2017-04-15 DIAGNOSIS — I4892 Unspecified atrial flutter: Secondary | ICD-10-CM | POA: Diagnosis not present

## 2017-04-15 DIAGNOSIS — I6340 Cerebral infarction due to embolism of unspecified cerebral artery: Secondary | ICD-10-CM | POA: Diagnosis not present

## 2017-04-15 NOTE — Progress Notes (Signed)
Cardiology Office Note:    Date:  04/16/2017   ID:  Debra Mcconnell, DOB November 13, 1945, MRN [id number]  PCP: Dr Burnett Sheng Cardiologist:  Shirlee More, MD    Referring MD: No ref. provider found    ASSESSMENT:    1. Atrial flutter, unspecified type ([place])   2. Chronic anticoagulation   3. Cerebrovascular accident (CVA) due to embolism of cerebral artery ([place])   4. Essential hypertension    PLAN:    In order of problems listed above:  1. Stable continue current anticoagulation 2. Stable continue her anticoagulant 3. Stable course remain anticoagulated 4. Stable blood pressure target continue multidrug regimen including loop diuretic calcium channel blocker and beta blocker ARB and hydralazine.   Next appointment: One year   Medication Adjustments/Labs and Tests Ordered: Current medicines are reviewed at length with the patient today.  Concerns regarding medicines are outlined above.  No orders of the defined types were placed in this encounter.  No orders of the defined types were placed in this encounter.   Chief Complaint  Patient presents with  . Follow-up    Routine flup appt    History of Present Illness:    Debra Mcconnell is a 71 y.o. female with a hx of Dyslipidemia, HTN, CVA, chronic atypical Atrial flutter and anticoagulation  last seen in January 2018. She is pleased with quality of her life and is medically. Recovery from her stroke. There is been no recurrent embolic event compliant with her anticoagulant and no bleeding complication. Compliance with diet, lifestyle and medications: Yes Past Medical History:  Diagnosis Date  . Acquired hammer toes of both feet 01/22/2017  . Arthritis 11/29/2013  . Atrial flutter ([place]) 01/24/2014   Overview:  CHADS2 vasc score= 5  . B-complex deficiency 11/29/2013  . Cerebral artery occlusion with cerebral infarction ([place]) 12/04/2013   Overview:  STORY: MRI +ve Left frontoparietal infarction (LMCA). +AFib.`E1o3L`IMPRESSION: BP is  under controlled. Continue home health. +AFib, con't Eliquis 5mg  bid. Will obtain old record from [facility] for review.  . Diabetic polyneuropathy associated with type 2 diabetes mellitus ([place]) 01/22/2017  . Essential hypertension 11/13/2015  . Hyperlipemia, retention 11/13/2015  . OSA (obstructive sleep apnea) 01/24/2014   Overview:  IMPRESSION: Will obtain old record for review and ask DME to d/l compliance and fax the result to me. F/u in 6 wks.  . Pre-ulcerative corn or callous 01/22/2017  . Sleep apnea 11/29/2013  . Stroke ([place]) 01/24/2014  . Thyroid disease 11/29/2013  . 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 CHOLECYSTECTOMY    . GASTROCYSTOPLASTY    . KNEE SURGERY      Current Medications: Current Meds  Medication Sig  . amLODipine (NORVASC) 10 MG tablet Take 10 mg by mouth daily.  Marland Kitchen apixaban (ELIQUIS) 5 MG TABS tablet Take 5 mg by mouth 2 (two) times daily.  Marland Kitchen atorvastatin (LIPITOR) 40 MG tablet Take 40 mg by mouth daily.  Marland Kitchen buPROPion (WELLBUTRIN XL) 300 MG 24 hr tablet Take 300 mg by mouth daily.  . carvedilol (COREG) 25 MG tablet Take 25 mg by mouth 2 (two) times daily.  . Cholecalciferol (VITAMIN D) 2000 units CAPS Take 4,000 Units by mouth 2 (two) times daily.  . Cyanocobalamin (VITAMIN B 12 PO) Take 1,000 mcg by mouth daily.  . Exenatide ER 2 MG SRER 2 mg once a week.  . furosemide (LASIX) 20 MG tablet Take  20 mg by mouth daily.  Marland Kitchen gabapentin (NEURONTIN) 300 MG capsule Take 300 mg by mouth 3 (three) times daily.  Marland Kitchen glimepiride (AMARYL) 1 MG tablet Take 1 mg by mouth as directed. Take 2 tablets in the am and 1 tablet in the pm  . hydrALAZINE (APRESOLINE) 50 MG tablet Take 50 mg by mouth 3 (three) times daily.  Marland Kitchen levothyroxine (SYNTHROID, LEVOTHROID) 175 MCG tablet Take 175 mcg by mouth daily.  Marland Kitchen losartan (COZAAR) 100 MG tablet Take 100 mg by mouth daily.  . potassium chloride SA  (KLOR-CON M15) 15 MEQ tablet Take 15 mEq by mouth daily.  Marland Kitchen zolpidem (AMBIEN) 5 MG tablet Take 5 mg by mouth at bedtime as needed for sleep.     Allergies:   Patient has no known allergies.   Social History   Social History  . Marital status: Divorced    Spouse name: N/A  . Number of children: N/A  . Years of education: N/A   Social History Main Topics  . Smoking status: Never Smoker  . Smokeless tobacco: Never Used  . Alcohol use No  . Drug use: No  . Sexual activity: Not Asked   Other Topics Concern  . None   Social History Narrative  . None     Family History: The patient's family history is not on file. ROS:   Please see the history of present illness.    All other systems reviewed and are negative.  EKGs/Labs/Other Studies Reviewed:    The following studies were reviewed today:  EKG:  EKG ordered today.  The ekg ordered today demonstrates Sinus rhythm normal  Recent Labs:Requested from her PCP No results found for requested labs within last 8760 hours.  Recent Lipid Panel No results found for: CHOL, TRIG, HDL, CHOLHDL, VLDL, LDLCALC, LDLDIRECT  Physical Exam:    VS:  BP 134/60   Pulse 67   Ht 5\' 6"  (1.676 m)   Wt 252 lb (114.3 kg)   SpO2 97%   BMI 40.67 kg/m     Wt Readings from Last 3 Encounters:  04/15/17 252 lb (114.3 kg)     GEN:  Well nourished, well developed in no acute distress HEENT: Normal NECK: No JVD; No carotid bruits LYMPHATICS: No lymphadenopathy CARDIAC: RRR, no murmurs, rubs, gallops RESPIRATORY:  Clear to auscultation without rales, wheezing or rhonchi  ABDOMEN: Soft, non-tender, non-distended MUSCULOSKELETAL:  No edema; No deformity  SKIN: Warm and dry NEUROLOGIC:  Alert and oriented x 3 PSYCHIATRIC:  Normal affect    Signed, Shirlee More, MD  04/16/2017 1:17 PM    [facility]

## 2017-04-16 ENCOUNTER — Encounter: Payer: Self-pay | Admitting: Cardiology

## 2017-04-16 DIAGNOSIS — Z7901 Long term (current) use of anticoagulants: Secondary | ICD-10-CM | POA: Insufficient documentation

## 2017-04-16 HISTORY — DX: Long term (current) use of anticoagulants: Z79.01

## 2017-04-16 NOTE — Patient Instructions (Signed)

## 2017-06-15 ENCOUNTER — Telehealth: Payer: Self-pay | Admitting: Cardiology

## 2017-06-15 ENCOUNTER — Other Ambulatory Visit: Payer: Self-pay

## 2017-06-15 MED ORDER — APIXABAN 5 MG PO TABS: 5.0000 mg | ORAL_TABLET | Freq: Two times a day (BID) | ORAL | 3 refills | Status: DC

## 2017-06-15 NOTE — Telephone Encounter (Signed)
°*  STAT* If patient is at the pharmacy, call can be transferred to refill team.   1. Which medications need to be refilled? (please list name of each medication and dose if known) Eluiquis  2. Which pharmacy/location (including street and city if local pharmacy) is medication to be sent to?[facility]  3. Do they need a 30 day or 90 day supply? #90 please put refills on it.Marland Kitchen

## 2017-06-15 NOTE — Telephone Encounter (Signed)
Refill sent.

## 2017-07-23 DIAGNOSIS — D500 Iron deficiency anemia secondary to blood loss (chronic): Secondary | ICD-10-CM | POA: Diagnosis not present

## 2017-07-23 DIAGNOSIS — I10 Essential (primary) hypertension: Secondary | ICD-10-CM | POA: Diagnosis not present

## 2017-07-23 DIAGNOSIS — Z79899 Other long term (current) drug therapy: Secondary | ICD-10-CM | POA: Diagnosis not present

## 2017-07-23 DIAGNOSIS — E1129 Type 2 diabetes mellitus with other diabetic kidney complication: Secondary | ICD-10-CM | POA: Diagnosis not present

## 2017-07-23 DIAGNOSIS — Z23 Encounter for immunization: Secondary | ICD-10-CM | POA: Diagnosis not present

## 2017-07-23 DIAGNOSIS — E7800 Pure hypercholesterolemia, unspecified: Secondary | ICD-10-CM | POA: Diagnosis not present

## 2017-07-23 DIAGNOSIS — E1140 Type 2 diabetes mellitus with diabetic neuropathy, unspecified: Secondary | ICD-10-CM | POA: Diagnosis not present

## 2017-07-23 DIAGNOSIS — G4733 Obstructive sleep apnea (adult) (pediatric): Secondary | ICD-10-CM | POA: Diagnosis not present

## 2017-07-23 DIAGNOSIS — Z0000 Encounter for general adult medical examination without abnormal findings: Secondary | ICD-10-CM | POA: Diagnosis not present

## 2017-07-23 DIAGNOSIS — Z6841 Body Mass Index (BMI) 40.0 and over, adult: Secondary | ICD-10-CM | POA: Diagnosis not present

## 2017-07-23 DIAGNOSIS — E038 Other specified hypothyroidism: Secondary | ICD-10-CM | POA: Diagnosis not present

## 2017-07-23 DIAGNOSIS — E113519 Type 2 diabetes mellitus with proliferative diabetic retinopathy with macular edema, unspecified eye: Secondary | ICD-10-CM | POA: Diagnosis not present

## 2017-07-27 DIAGNOSIS — E113293 Type 2 diabetes mellitus with mild nonproliferative diabetic retinopathy without macular edema, bilateral: Secondary | ICD-10-CM | POA: Diagnosis not present

## 2017-09-23 DIAGNOSIS — E113293 Type 2 diabetes mellitus with mild nonproliferative diabetic retinopathy without macular edema, bilateral: Secondary | ICD-10-CM | POA: Diagnosis not present

## 2017-10-26 DIAGNOSIS — E1129 Type 2 diabetes mellitus with other diabetic kidney complication: Secondary | ICD-10-CM | POA: Diagnosis not present

## 2017-10-26 DIAGNOSIS — E7800 Pure hypercholesterolemia, unspecified: Secondary | ICD-10-CM | POA: Diagnosis not present

## 2017-10-26 DIAGNOSIS — I10 Essential (primary) hypertension: Secondary | ICD-10-CM | POA: Diagnosis not present

## 2017-10-26 DIAGNOSIS — E1140 Type 2 diabetes mellitus with diabetic neuropathy, unspecified: Secondary | ICD-10-CM | POA: Diagnosis not present

## 2017-10-26 DIAGNOSIS — Z79899 Other long term (current) drug therapy: Secondary | ICD-10-CM | POA: Diagnosis not present

## 2017-10-26 DIAGNOSIS — E113519 Type 2 diabetes mellitus with proliferative diabetic retinopathy with macular edema, unspecified eye: Secondary | ICD-10-CM | POA: Diagnosis not present

## 2017-11-12 DIAGNOSIS — R21 Rash and other nonspecific skin eruption: Secondary | ICD-10-CM | POA: Diagnosis not present

## 2017-11-25 ENCOUNTER — Encounter: Payer: Self-pay | Admitting: Gastroenterology

## 2018-01-07 ENCOUNTER — Encounter: Payer: Self-pay | Admitting: Gastroenterology

## 2018-01-25 DIAGNOSIS — E038 Other specified hypothyroidism: Secondary | ICD-10-CM | POA: Diagnosis not present

## 2018-01-25 DIAGNOSIS — Z79899 Other long term (current) drug therapy: Secondary | ICD-10-CM | POA: Diagnosis not present

## 2018-01-25 DIAGNOSIS — E7800 Pure hypercholesterolemia, unspecified: Secondary | ICD-10-CM | POA: Diagnosis not present

## 2018-01-25 DIAGNOSIS — E1140 Type 2 diabetes mellitus with diabetic neuropathy, unspecified: Secondary | ICD-10-CM | POA: Diagnosis not present

## 2018-01-25 DIAGNOSIS — Z6841 Body Mass Index (BMI) 40.0 and over, adult: Secondary | ICD-10-CM | POA: Diagnosis not present

## 2018-01-25 DIAGNOSIS — E1129 Type 2 diabetes mellitus with other diabetic kidney complication: Secondary | ICD-10-CM | POA: Diagnosis not present

## 2018-01-25 DIAGNOSIS — I10 Essential (primary) hypertension: Secondary | ICD-10-CM | POA: Diagnosis not present

## 2018-01-25 DIAGNOSIS — E113519 Type 2 diabetes mellitus with proliferative diabetic retinopathy with macular edema, unspecified eye: Secondary | ICD-10-CM | POA: Diagnosis not present

## 2018-02-11 ENCOUNTER — Ambulatory Visit: Payer: Medicare Other | Admitting: Gastroenterology

## 2018-03-23 DIAGNOSIS — E113419 Type 2 diabetes mellitus with severe nonproliferative diabetic retinopathy with macular edema, unspecified eye: Secondary | ICD-10-CM | POA: Diagnosis not present

## 2018-04-06 ENCOUNTER — Other Ambulatory Visit: Payer: Self-pay | Admitting: Cardiology

## 2018-04-09 ENCOUNTER — Other Ambulatory Visit: Payer: Self-pay | Admitting: Cardiology

## 2018-04-28 DIAGNOSIS — H43811 Vitreous degeneration, right eye: Secondary | ICD-10-CM | POA: Diagnosis not present

## 2018-04-28 DIAGNOSIS — E113593 Type 2 diabetes mellitus with proliferative diabetic retinopathy without macular edema, bilateral: Secondary | ICD-10-CM | POA: Diagnosis not present

## 2018-05-11 ENCOUNTER — Other Ambulatory Visit: Payer: Self-pay

## 2018-05-11 MED ORDER — APIXABAN 5 MG PO TABS: 5.0000 mg | ORAL_TABLET | Freq: Two times a day (BID) | ORAL | 0 refills | Status: DC

## 2018-08-02 DIAGNOSIS — I10 Essential (primary) hypertension: Secondary | ICD-10-CM | POA: Diagnosis not present

## 2018-08-02 DIAGNOSIS — Z0000 Encounter for general adult medical examination without abnormal findings: Secondary | ICD-10-CM | POA: Diagnosis not present

## 2018-08-02 DIAGNOSIS — E038 Other specified hypothyroidism: Secondary | ICD-10-CM | POA: Diagnosis not present

## 2018-08-02 DIAGNOSIS — N959 Unspecified menopausal and perimenopausal disorder: Secondary | ICD-10-CM | POA: Diagnosis not present

## 2018-08-02 DIAGNOSIS — N183 Chronic kidney disease, stage 3 (moderate): Secondary | ICD-10-CM | POA: Diagnosis not present

## 2018-08-02 DIAGNOSIS — Z6841 Body Mass Index (BMI) 40.0 and over, adult: Secondary | ICD-10-CM | POA: Diagnosis not present

## 2018-08-02 DIAGNOSIS — Z23 Encounter for immunization: Secondary | ICD-10-CM | POA: Diagnosis not present

## 2018-08-02 DIAGNOSIS — E7800 Pure hypercholesterolemia, unspecified: Secondary | ICD-10-CM | POA: Diagnosis not present

## 2018-08-02 DIAGNOSIS — E113419 Type 2 diabetes mellitus with severe nonproliferative diabetic retinopathy with macular edema, unspecified eye: Secondary | ICD-10-CM | POA: Diagnosis not present

## 2018-08-02 DIAGNOSIS — I129 Hypertensive chronic kidney disease with stage 1 through stage 4 chronic kidney disease, or unspecified chronic kidney disease: Secondary | ICD-10-CM | POA: Diagnosis not present

## 2018-08-02 DIAGNOSIS — E1129 Type 2 diabetes mellitus with other diabetic kidney complication: Secondary | ICD-10-CM | POA: Diagnosis not present

## 2018-08-02 DIAGNOSIS — E1140 Type 2 diabetes mellitus with diabetic neuropathy, unspecified: Secondary | ICD-10-CM | POA: Diagnosis not present

## 2018-08-17 DIAGNOSIS — H1131 Conjunctival hemorrhage, right eye: Secondary | ICD-10-CM | POA: Diagnosis not present

## 2018-08-30 ENCOUNTER — Other Ambulatory Visit: Payer: Self-pay | Admitting: Cardiology

## 2018-09-14 ENCOUNTER — Other Ambulatory Visit: Payer: Self-pay | Admitting: Cardiology

## 2018-10-11 NOTE — Progress Notes (Signed)
Cardiology Office Note:    Date:  10/12/2018   ID:  Debra Mcconnell, DOB [birth year], MRN [id number]  PCP:  Greig Right, MD  Cardiologist:  Shirlee More, MD    Referring MD: No ref. provider found    ASSESSMENT:    1. Atrial flutter, unspecified type ([place])   2. Essential hypertension   3. Chronic anticoagulation    PLAN:    In order of problems listed above:  1. Stable rate controlled I asked her to begin to screen heart rate and blood pressure at home several days a week let me know if she is having persistent bradycardia rates of less than 50/min.  She will continue her beta-blocker anticoagulant and I will see need indication to consider pacemaker therapy at this time 2. Stable blood pressure target continue current treatment 3. Stable continue anticoagulant   Next appointment: 1 year   Medication Adjustments/Labs and Tests Ordered: Current medicines are reviewed at length with the patient today.  Concerns regarding medicines are outlined above.  Orders Placed This Encounter  Procedures  . EKG 12-Lead   Meds ordered this encounter  Medications  . apixaban (ELIQUIS) 5 MG TABS tablet    Sig: Take 1 tablet (5 mg total) by mouth 2 (two) times daily.    Dispense:  180 tablet    Refill:  3    Pt must schedule appt for follow up before any additional refills.    Chief Complaint  Patient presents with  . Atrial Flutter    History of Present Illness:    Debra Mcconnell is a 73 y.o. female with a hx of  Dyslipidemia, HTN, CVA, chronic atypical Atrial flutter and anticoagulation  last seen 04/15/18. Compliance with diet, lifestyle and medications: Yes  If anything improved no palpitation shortness of breath chest pain orthopnea edema. Past Medical History:  Diagnosis Date  . Acquired hammer toes of both feet 01/22/2017  . Arthritis 11/29/2013  . Atrial flutter ([place]) 01/24/2014   Overview:  CHADS2 vasc score= 5  . B-complex deficiency 11/29/2013  . Cerebral artery  occlusion with cerebral infarction ([place]) 12/04/2013   Overview:  STORY: MRI +ve Left frontoparietal infarction (LMCA). +AFib.`E1o3L`IMPRESSION: BP is under controlled. Continue home health. +AFib, con't Eliquis 5mg  bid. Will obtain old record from [facility] for review.  . Diabetic polyneuropathy associated with type 2 diabetes mellitus ([place]) 01/22/2017  . Essential hypertension 11/13/2015  . Hyperlipemia, retention 11/13/2015  . OSA (obstructive sleep apnea) 01/24/2014   Overview:  IMPRESSION: Will obtain old record for review and ask DME to d/l compliance and fax the result to me. F/u in 6 wks.  . Pre-ulcerative corn or callous 01/22/2017  . Sleep apnea 11/29/2013  . Stroke ([place]) 01/24/2014  . Thyroid disease 11/29/2013  . Type II diabetes mellitus with ophthalmic manifestations ([place]) 11/29/2013    Past Surgical History:  Procedure Laterality Date  . CHOLECYSTECTOMY    . GASTROCYSTOPLASTY    . KNEE SURGERY      Current Medications: Current Meds  Medication Sig  . amLODipine (NORVASC) 10 MG tablet Take 10 mg by mouth daily.  Marland Kitchen apixaban (ELIQUIS) 5 MG TABS tablet Take 1 tablet (5 mg total) by mouth 2 (two) times daily.  Marland Kitchen atorvastatin (LIPITOR) 40 MG tablet Take 40 mg by mouth daily.  Marland Kitchen buPROPion (WELLBUTRIN XL) 300 MG 24 hr tablet Take 300 mg by mouth daily.  . carvedilol (COREG) 25 MG tablet Take 25 mg by mouth 2 (two) times daily.  Marland Kitchen  Cholecalciferol (VITAMIN D) 2000 units CAPS Take 4,000 Units by mouth 2 (two) times daily.  . Cyanocobalamin (VITAMIN B 12 PO) Take 1,000 mcg by mouth daily.  . Exenatide ER 2 MG SRER 2 mg once a week.  . furosemide (LASIX) 20 MG tablet Take 20 mg by mouth daily.  Marland Kitchen gabapentin (NEURONTIN) 300 MG capsule Take 300 mg by mouth 3 (three) times daily.  Marland Kitchen glimepiride (AMARYL) 1 MG tablet Take 1 mg by mouth as directed. Take 2 tablets in the am and 1 tablet in the pm  . hydrALAZINE (APRESOLINE) 50 MG tablet Take 50 mg by mouth 3 (three) times daily.  Marland Kitchen  levothyroxine (SYNTHROID, LEVOTHROID) 175 MCG tablet Take 175 mcg by mouth daily.  Marland Kitchen losartan (COZAAR) 100 MG tablet Take 100 mg by mouth daily.  . potassium chloride SA (KLOR-CON M15) 15 MEQ tablet Take 15 mEq by mouth daily.  Marland Kitchen zolpidem (AMBIEN) 5 MG tablet Take 5 mg by mouth at bedtime as needed for sleep.  . [DISCONTINUED] ELIQUIS 5 MG TABS tablet TAKE 1 TABLET TWICE A DAY     Allergies:   Patient has no known allergies.   Social History   Socioeconomic History  . Marital status: Divorced    Spouse name: Not on file  . Number of children: Not on file  . Years of education: Not on file  . Highest education level: Not on file  Occupational History  . Not on file  Social Needs  . Financial resource strain: Not on file  . Food insecurity:    Worry: Not on file    Inability: Not on file  . Transportation needs:    Medical: Not on file    Non-medical: Not on file  Tobacco Use  . Smoking status: Never Smoker  . Smokeless tobacco: Never Used  Substance and Sexual Activity  . Alcohol use: No  . Drug use: No  . Sexual activity: Not on file  Lifestyle  . Physical activity:    Days per week: Not on file    Minutes per session: Not on file  . Stress: Not on file  Relationships  . Social connections:    Talks on phone: Not on file    Gets together: Not on file    Attends religious service: Not on file    Active member of club or organization: Not on file    Attends meetings of clubs or organizations: Not on file    Relationship status: Not on file  Other Topics Concern  . Not on file  Social History Narrative  . Not on file     Family History: The patient's family history includes Cancer in her father; Heart disease in her father and mother. ROS:   Please see the history of present illness.    All other systems reviewed and are negative.  EKGs/Labs/Other Studies Reviewed:    The following studies were reviewed today:  EKG:  EKG ordered today.  The ekg ordered  today demonstrates atrial flutter and a controlled rate  Recent Labs:   08/02/2018 cholesterol 97 HDL 47 LDL 36 creatinine 1.18 No results found for requested labs within last 8760 hours.  Recent Lipid Panel No results found for: CHOL, TRIG, HDL, CHOLHDL, VLDL, LDLCALC, LDLDIRECT  Physical Exam:    VS:  BP 120/74 (BP Location: Left Arm, Patient Position: Sitting, Cuff Size: Large)   Pulse 64   Ht 5\' 5"  (1.651 m)   Wt 254 lb 8 oz (115.4  kg)   SpO2 95%   BMI 42.35 kg/m     Wt Readings from Last 3 Encounters:  10/12/18 254 lb 8 oz (115.4 kg)  04/15/17 252 lb (114.3 kg)     GEN:  Well nourished, well developed in no acute distress HEENT: Normal NECK: No JVD; No carotid bruits LYMPHATICS: No lymphadenopathy CARDIAC: RRR, no murmurs, rubs, gallops RESPIRATORY:  Clear to auscultation without rales, wheezing or rhonchi  ABDOMEN: Soft, non-tender, non-distended MUSCULOSKELETAL:  No edema; No deformity  SKIN: Warm and dry NEUROLOGIC:  Alert and oriented x 3 PSYCHIATRIC:  Normal affect    Signed, Shirlee More, MD  10/12/2018 2:31 PM     [facility]

## 2018-10-12 ENCOUNTER — Ambulatory Visit (INDEPENDENT_AMBULATORY_CARE_PROVIDER_SITE_OTHER): Payer: Medicare Other | Admitting: Cardiology

## 2018-10-12 ENCOUNTER — Encounter: Payer: Self-pay | Admitting: Cardiology

## 2018-10-12 VITALS — BP 120/74 | HR 64 | Ht 65.0 in | Wt 254.5 lb

## 2018-10-12 DIAGNOSIS — I4892 Unspecified atrial flutter: Secondary | ICD-10-CM

## 2018-10-12 DIAGNOSIS — I10 Essential (primary) hypertension: Secondary | ICD-10-CM

## 2018-10-12 DIAGNOSIS — Z7901 Long term (current) use of anticoagulants: Secondary | ICD-10-CM

## 2018-10-12 MED ORDER — APIXABAN 5 MG PO TABS: 5.0000 mg | ORAL_TABLET | Freq: Two times a day (BID) | ORAL | 3 refills | Status: DC

## 2018-10-12 NOTE — Patient Instructions (Addendum)
Medication Instructions:  Your physician recommends that you continue on your current medications as directed. Please refer to the Current Medication list given to you today.  If you need a refill on your cardiac medications before your next appointment, please call your pharmacy.   Your physician has requested that you regularly monitor and record your blood pressure and heart rate readings 2-3 times a week at home. Please use the same machine at the same time of day to check your readings and record them to bring to your follow-up visit.  Please call if your heart rate is less than 50 beats per minute.     Lab work: NONE If you have labs (blood work) drawn today and your tests are completely normal, you will receive your results only by: Marland Kitchen MyChart Message (if you have MyChart) OR . A paper copy in the mail If you have any lab test that is abnormal or we need to change your treatment, we will call you to review the results.  Testing/Procedures: You had an EKG today  Follow-Up: At [facility], you and your health needs are our priority.  As part of our continuing mission to provide you with exceptional heart care, we have created designated Provider Care Teams.  These Care Teams include your primary Cardiologist (physician) and Advanced Practice Providers (APPs -  Physician Assistants and Nurse Practitioners) who all work together to provide you with the care you need, when you need it. You will need a follow up appointment in 1 years.  Please call our office 2 months in advance to schedule this appointment.      KardiaMobile [email or web address]        FDA-cleared, clinical grade mobile EKG monitor: Jodelle Red is the most clinically-validated mobile EKG used by the world's leading cardiac care medical professionals With Basic service, know instantly if your heart rhythm is normal or if atrial fibrillation is detected, and email the last single EKG recording  to yourself or your doctor Premium service, available for purchase through the Kardia app for $9.99 per month or $99 per year, includes unlimited history and storage of your EKG recordings, a monthly EKG summary report to share with your doctor, along with the ability to track your blood pressure, activity and weight Includes one KardiaMobile phone clip FREE SHIPPING: Standard delivery 1-3 business days. Orders placed by 11:00am PST will ship that afternoon. Otherwise, will ship next business day. All orders ship via [employer] from [place], [place]

## 2018-11-01 DIAGNOSIS — I10 Essential (primary) hypertension: Secondary | ICD-10-CM | POA: Diagnosis not present

## 2018-11-01 DIAGNOSIS — Z6841 Body Mass Index (BMI) 40.0 and over, adult: Secondary | ICD-10-CM | POA: Diagnosis not present

## 2018-11-01 DIAGNOSIS — E7800 Pure hypercholesterolemia, unspecified: Secondary | ICD-10-CM | POA: Diagnosis not present

## 2018-11-01 DIAGNOSIS — E038 Other specified hypothyroidism: Secondary | ICD-10-CM | POA: Diagnosis not present

## 2018-11-01 DIAGNOSIS — E1129 Type 2 diabetes mellitus with other diabetic kidney complication: Secondary | ICD-10-CM | POA: Diagnosis not present

## 2018-11-01 DIAGNOSIS — E1140 Type 2 diabetes mellitus with diabetic neuropathy, unspecified: Secondary | ICD-10-CM | POA: Diagnosis not present

## 2018-11-01 DIAGNOSIS — Z1231 Encounter for screening mammogram for malignant neoplasm of breast: Secondary | ICD-10-CM | POA: Diagnosis not present

## 2018-11-01 DIAGNOSIS — E113519 Type 2 diabetes mellitus with proliferative diabetic retinopathy with macular edema, unspecified eye: Secondary | ICD-10-CM | POA: Diagnosis not present

## 2018-11-11 DIAGNOSIS — E119 Type 2 diabetes mellitus without complications: Secondary | ICD-10-CM | POA: Diagnosis not present

## 2018-11-11 DIAGNOSIS — M2041 Other hammer toe(s) (acquired), right foot: Secondary | ICD-10-CM | POA: Diagnosis not present

## 2018-11-11 DIAGNOSIS — Z1231 Encounter for screening mammogram for malignant neoplasm of breast: Secondary | ICD-10-CM | POA: Diagnosis not present

## 2018-11-11 DIAGNOSIS — M2042 Other hammer toe(s) (acquired), left foot: Secondary | ICD-10-CM | POA: Diagnosis not present

## 2018-11-11 DIAGNOSIS — E1142 Type 2 diabetes mellitus with diabetic polyneuropathy: Secondary | ICD-10-CM | POA: Diagnosis not present

## 2018-11-11 HISTORY — DX: Type 2 diabetes mellitus without complications: E11.9

## 2019-02-02 DIAGNOSIS — I10 Essential (primary) hypertension: Secondary | ICD-10-CM | POA: Diagnosis not present

## 2019-02-02 DIAGNOSIS — N183 Chronic kidney disease, stage 3 (moderate): Secondary | ICD-10-CM | POA: Diagnosis not present

## 2019-02-02 DIAGNOSIS — E113511 Type 2 diabetes mellitus with proliferative diabetic retinopathy with macular edema, right eye: Secondary | ICD-10-CM | POA: Diagnosis not present

## 2019-02-02 DIAGNOSIS — E038 Other specified hypothyroidism: Secondary | ICD-10-CM | POA: Diagnosis not present

## 2019-02-02 DIAGNOSIS — E113519 Type 2 diabetes mellitus with proliferative diabetic retinopathy with macular edema, unspecified eye: Secondary | ICD-10-CM | POA: Diagnosis not present

## 2019-02-02 DIAGNOSIS — E1129 Type 2 diabetes mellitus with other diabetic kidney complication: Secondary | ICD-10-CM | POA: Diagnosis not present

## 2019-02-02 DIAGNOSIS — E1140 Type 2 diabetes mellitus with diabetic neuropathy, unspecified: Secondary | ICD-10-CM | POA: Diagnosis not present

## 2019-02-02 DIAGNOSIS — I6990 Unspecified sequelae of unspecified cerebrovascular disease: Secondary | ICD-10-CM | POA: Diagnosis not present

## 2019-02-02 DIAGNOSIS — E7800 Pure hypercholesterolemia, unspecified: Secondary | ICD-10-CM | POA: Diagnosis not present

## 2019-02-02 DIAGNOSIS — Z6841 Body Mass Index (BMI) 40.0 and over, adult: Secondary | ICD-10-CM | POA: Diagnosis not present

## 2019-02-02 DIAGNOSIS — E538 Deficiency of other specified B group vitamins: Secondary | ICD-10-CM | POA: Diagnosis not present

## 2019-02-02 DIAGNOSIS — I129 Hypertensive chronic kidney disease with stage 1 through stage 4 chronic kidney disease, or unspecified chronic kidney disease: Secondary | ICD-10-CM | POA: Diagnosis not present

## 2019-03-21 DIAGNOSIS — E11621 Type 2 diabetes mellitus with foot ulcer: Secondary | ICD-10-CM | POA: Diagnosis not present

## 2019-04-04 DIAGNOSIS — E11621 Type 2 diabetes mellitus with foot ulcer: Secondary | ICD-10-CM | POA: Diagnosis not present

## 2019-04-04 DIAGNOSIS — Z5181 Encounter for therapeutic drug level monitoring: Secondary | ICD-10-CM | POA: Diagnosis not present

## 2019-04-18 DIAGNOSIS — E1165 Type 2 diabetes mellitus with hyperglycemia: Secondary | ICD-10-CM | POA: Diagnosis not present

## 2019-04-18 DIAGNOSIS — H6123 Impacted cerumen, bilateral: Secondary | ICD-10-CM | POA: Diagnosis not present

## 2019-04-18 DIAGNOSIS — H9191 Unspecified hearing loss, right ear: Secondary | ICD-10-CM | POA: Diagnosis not present

## 2019-04-18 DIAGNOSIS — R531 Weakness: Secondary | ICD-10-CM | POA: Diagnosis not present

## 2019-04-19 DIAGNOSIS — E785 Hyperlipidemia, unspecified: Secondary | ICD-10-CM | POA: Diagnosis present

## 2019-04-19 DIAGNOSIS — R0600 Dyspnea, unspecified: Secondary | ICD-10-CM | POA: Diagnosis not present

## 2019-04-19 DIAGNOSIS — J811 Chronic pulmonary edema: Secondary | ICD-10-CM | POA: Diagnosis not present

## 2019-04-19 DIAGNOSIS — R0689 Other abnormalities of breathing: Secondary | ICD-10-CM | POA: Diagnosis not present

## 2019-04-19 DIAGNOSIS — I110 Hypertensive heart disease with heart failure: Secondary | ICD-10-CM | POA: Diagnosis not present

## 2019-04-19 DIAGNOSIS — Z7984 Long term (current) use of oral hypoglycemic drugs: Secondary | ICD-10-CM | POA: Diagnosis not present

## 2019-04-19 DIAGNOSIS — I509 Heart failure, unspecified: Secondary | ICD-10-CM | POA: Diagnosis not present

## 2019-04-19 DIAGNOSIS — E039 Hypothyroidism, unspecified: Secondary | ICD-10-CM | POA: Diagnosis not present

## 2019-04-19 DIAGNOSIS — Z7901 Long term (current) use of anticoagulants: Secondary | ICD-10-CM | POA: Diagnosis not present

## 2019-04-19 DIAGNOSIS — Z8673 Personal history of transient ischemic attack (TIA), and cerebral infarction without residual deficits: Secondary | ICD-10-CM | POA: Diagnosis not present

## 2019-04-19 DIAGNOSIS — J90 Pleural effusion, not elsewhere classified: Secondary | ICD-10-CM | POA: Diagnosis not present

## 2019-04-19 DIAGNOSIS — E669 Obesity, unspecified: Secondary | ICD-10-CM | POA: Diagnosis present

## 2019-04-19 DIAGNOSIS — R609 Edema, unspecified: Secondary | ICD-10-CM | POA: Diagnosis not present

## 2019-04-19 DIAGNOSIS — J9601 Acute respiratory failure with hypoxia: Secondary | ICD-10-CM | POA: Diagnosis not present

## 2019-04-19 DIAGNOSIS — I361 Nonrheumatic tricuspid (valve) insufficiency: Secondary | ICD-10-CM | POA: Diagnosis not present

## 2019-04-19 DIAGNOSIS — E119 Type 2 diabetes mellitus without complications: Secondary | ICD-10-CM | POA: Diagnosis present

## 2019-04-19 DIAGNOSIS — R0603 Acute respiratory distress: Secondary | ICD-10-CM | POA: Diagnosis not present

## 2019-04-19 DIAGNOSIS — I10 Essential (primary) hypertension: Secondary | ICD-10-CM | POA: Diagnosis not present

## 2019-04-19 DIAGNOSIS — Z87891 Personal history of nicotine dependence: Secondary | ICD-10-CM | POA: Diagnosis not present

## 2019-04-19 DIAGNOSIS — Z79899 Other long term (current) drug therapy: Secondary | ICD-10-CM | POA: Diagnosis not present

## 2019-04-19 DIAGNOSIS — H9191 Unspecified hearing loss, right ear: Secondary | ICD-10-CM | POA: Diagnosis present

## 2019-04-19 DIAGNOSIS — Z96653 Presence of artificial knee joint, bilateral: Secondary | ICD-10-CM | POA: Diagnosis present

## 2019-04-19 DIAGNOSIS — I5023 Acute on chronic systolic (congestive) heart failure: Secondary | ICD-10-CM | POA: Diagnosis not present

## 2019-04-19 DIAGNOSIS — I340 Nonrheumatic mitral (valve) insufficiency: Secondary | ICD-10-CM | POA: Diagnosis not present

## 2019-04-19 DIAGNOSIS — I4891 Unspecified atrial fibrillation: Secondary | ICD-10-CM | POA: Diagnosis not present

## 2019-04-19 DIAGNOSIS — R0902 Hypoxemia: Secondary | ICD-10-CM | POA: Diagnosis not present

## 2019-04-19 DIAGNOSIS — Z20828 Contact with and (suspected) exposure to other viral communicable diseases: Secondary | ICD-10-CM | POA: Diagnosis not present

## 2019-04-24 DIAGNOSIS — Z96653 Presence of artificial knee joint, bilateral: Secondary | ICD-10-CM | POA: Diagnosis not present

## 2019-04-24 DIAGNOSIS — R269 Unspecified abnormalities of gait and mobility: Secondary | ICD-10-CM | POA: Diagnosis not present

## 2019-04-24 DIAGNOSIS — I5023 Acute on chronic systolic (congestive) heart failure: Secondary | ICD-10-CM | POA: Diagnosis not present

## 2019-04-24 DIAGNOSIS — E669 Obesity, unspecified: Secondary | ICD-10-CM | POA: Diagnosis not present

## 2019-04-24 DIAGNOSIS — Z7901 Long term (current) use of anticoagulants: Secondary | ICD-10-CM | POA: Diagnosis not present

## 2019-04-24 DIAGNOSIS — E039 Hypothyroidism, unspecified: Secondary | ICD-10-CM | POA: Diagnosis not present

## 2019-04-24 DIAGNOSIS — E119 Type 2 diabetes mellitus without complications: Secondary | ICD-10-CM | POA: Diagnosis not present

## 2019-04-24 DIAGNOSIS — E785 Hyperlipidemia, unspecified: Secondary | ICD-10-CM | POA: Diagnosis not present

## 2019-04-24 DIAGNOSIS — Z8673 Personal history of transient ischemic attack (TIA), and cerebral infarction without residual deficits: Secondary | ICD-10-CM | POA: Diagnosis not present

## 2019-04-24 DIAGNOSIS — I110 Hypertensive heart disease with heart failure: Secondary | ICD-10-CM | POA: Diagnosis not present

## 2019-04-24 DIAGNOSIS — Z79899 Other long term (current) drug therapy: Secondary | ICD-10-CM | POA: Diagnosis not present

## 2019-04-24 DIAGNOSIS — R2681 Unsteadiness on feet: Secondary | ICD-10-CM | POA: Diagnosis not present

## 2019-04-24 DIAGNOSIS — H9191 Unspecified hearing loss, right ear: Secondary | ICD-10-CM | POA: Diagnosis not present

## 2019-04-24 DIAGNOSIS — J9621 Acute and chronic respiratory failure with hypoxia: Secondary | ICD-10-CM | POA: Diagnosis not present

## 2019-04-24 DIAGNOSIS — Z7984 Long term (current) use of oral hypoglycemic drugs: Secondary | ICD-10-CM | POA: Diagnosis not present

## 2019-04-24 DIAGNOSIS — Z87891 Personal history of nicotine dependence: Secondary | ICD-10-CM | POA: Diagnosis not present

## 2019-04-25 DIAGNOSIS — E11621 Type 2 diabetes mellitus with foot ulcer: Secondary | ICD-10-CM | POA: Diagnosis not present

## 2019-04-25 DIAGNOSIS — H9190 Unspecified hearing loss, unspecified ear: Secondary | ICD-10-CM | POA: Diagnosis not present

## 2019-04-25 DIAGNOSIS — Z6841 Body Mass Index (BMI) 40.0 and over, adult: Secondary | ICD-10-CM | POA: Diagnosis not present

## 2019-04-25 DIAGNOSIS — I5021 Acute systolic (congestive) heart failure: Secondary | ICD-10-CM | POA: Diagnosis not present

## 2019-04-26 DIAGNOSIS — E039 Hypothyroidism, unspecified: Secondary | ICD-10-CM | POA: Diagnosis not present

## 2019-04-26 DIAGNOSIS — I5023 Acute on chronic systolic (congestive) heart failure: Secondary | ICD-10-CM | POA: Diagnosis not present

## 2019-04-26 DIAGNOSIS — E119 Type 2 diabetes mellitus without complications: Secondary | ICD-10-CM | POA: Diagnosis not present

## 2019-04-26 DIAGNOSIS — E785 Hyperlipidemia, unspecified: Secondary | ICD-10-CM | POA: Diagnosis not present

## 2019-04-26 DIAGNOSIS — I110 Hypertensive heart disease with heart failure: Secondary | ICD-10-CM | POA: Diagnosis not present

## 2019-04-26 DIAGNOSIS — J9621 Acute and chronic respiratory failure with hypoxia: Secondary | ICD-10-CM | POA: Diagnosis not present

## 2019-04-28 DIAGNOSIS — I5023 Acute on chronic systolic (congestive) heart failure: Secondary | ICD-10-CM | POA: Diagnosis not present

## 2019-04-28 DIAGNOSIS — E119 Type 2 diabetes mellitus without complications: Secondary | ICD-10-CM | POA: Diagnosis not present

## 2019-04-28 DIAGNOSIS — J9621 Acute and chronic respiratory failure with hypoxia: Secondary | ICD-10-CM | POA: Diagnosis not present

## 2019-04-28 DIAGNOSIS — E039 Hypothyroidism, unspecified: Secondary | ICD-10-CM | POA: Diagnosis not present

## 2019-04-28 DIAGNOSIS — I110 Hypertensive heart disease with heart failure: Secondary | ICD-10-CM | POA: Diagnosis not present

## 2019-04-28 DIAGNOSIS — E785 Hyperlipidemia, unspecified: Secondary | ICD-10-CM | POA: Diagnosis not present

## 2019-05-02 DIAGNOSIS — E785 Hyperlipidemia, unspecified: Secondary | ICD-10-CM | POA: Diagnosis not present

## 2019-05-02 DIAGNOSIS — I110 Hypertensive heart disease with heart failure: Secondary | ICD-10-CM | POA: Diagnosis not present

## 2019-05-02 DIAGNOSIS — J9621 Acute and chronic respiratory failure with hypoxia: Secondary | ICD-10-CM | POA: Diagnosis not present

## 2019-05-02 DIAGNOSIS — E039 Hypothyroidism, unspecified: Secondary | ICD-10-CM | POA: Diagnosis not present

## 2019-05-02 DIAGNOSIS — I5023 Acute on chronic systolic (congestive) heart failure: Secondary | ICD-10-CM | POA: Diagnosis not present

## 2019-05-02 DIAGNOSIS — E119 Type 2 diabetes mellitus without complications: Secondary | ICD-10-CM | POA: Diagnosis not present

## 2019-05-05 DIAGNOSIS — E119 Type 2 diabetes mellitus without complications: Secondary | ICD-10-CM | POA: Diagnosis not present

## 2019-05-05 DIAGNOSIS — E785 Hyperlipidemia, unspecified: Secondary | ICD-10-CM | POA: Diagnosis not present

## 2019-05-05 DIAGNOSIS — E039 Hypothyroidism, unspecified: Secondary | ICD-10-CM | POA: Diagnosis not present

## 2019-05-05 DIAGNOSIS — I110 Hypertensive heart disease with heart failure: Secondary | ICD-10-CM | POA: Diagnosis not present

## 2019-05-05 DIAGNOSIS — I5023 Acute on chronic systolic (congestive) heart failure: Secondary | ICD-10-CM | POA: Diagnosis not present

## 2019-05-05 DIAGNOSIS — J9621 Acute and chronic respiratory failure with hypoxia: Secondary | ICD-10-CM | POA: Diagnosis not present

## 2019-05-06 ENCOUNTER — Ambulatory Visit: Payer: Medicare Other | Admitting: Cardiology

## 2019-05-06 NOTE — Progress Notes (Deleted)
Cardiology Office Note:    Date:  05/06/2019   ID:  Debra Mcconnell, DOB 1946/05/23, MRN [id number]  PCP:  Greig Right, MD  Cardiologist:  Shirlee More, MD    Referring MD: Greig Right, MD    ASSESSMENT:    No diagnosis found. PLAN:    In order of problems listed above:  1. ***   Next appointment: ***   Medication Adjustments/Labs and Tests Ordered: Current medicines are reviewed at length with the patient today.  Concerns regarding medicines are outlined above.  No orders of the defined types were placed in this encounter.  No orders of the defined types were placed in this encounter.   No chief complaint on file.   History of Present Illness:    Debra Mcconnell is a 73 y.o. female with a hx of  Dyslipidemia, HTN, CVA, chronic atypical Atrial flutter and anticoagulation  last seen 10/12/2018. Compliance with diet, lifestyle and medications: *** Past Medical History:  Diagnosis Date  . Acquired hammer toes of both feet 01/22/2017  . Arthritis 11/29/2013  . Atrial flutter ([place]) 01/24/2014   Overview:  CHADS2 vasc score= 5  . B-complex deficiency 11/29/2013  . Cerebral artery occlusion with cerebral infarction ([place]) 12/04/2013   Overview:  STORY: MRI +ve Left frontoparietal infarction (LMCA). +AFib.`E1o3L`IMPRESSION: BP is under controlled. Continue home health. +AFib, con't Eliquis 5mg  bid. Will obtain old record from [facility] for review.  . Diabetic polyneuropathy associated with type 2 diabetes mellitus ([place]) 01/22/2017  . Essential hypertension 11/13/2015  . Hyperlipemia, retention 11/13/2015  . OSA (obstructive sleep apnea) 01/24/2014   Overview:  IMPRESSION: Will obtain old record for review and ask DME to d/l compliance and fax the result to me. F/u in 6 wks.  . Pre-ulcerative corn or callous 01/22/2017  . Sleep apnea 11/29/2013  . Stroke ([place]) 01/24/2014  . Thyroid disease 11/29/2013  . Type II diabetes mellitus with ophthalmic manifestations ([place]) 11/29/2013     Past Surgical History:  Procedure Laterality Date  . CHOLECYSTECTOMY    . GASTROCYSTOPLASTY    . KNEE SURGERY      Current Medications: No outpatient medications have been marked as taking for the 05/06/19 encounter (Appointment) with Richardo Priest, MD.     Allergies:   Patient has no known allergies.   Social History   Socioeconomic History  . Marital status: Divorced    Spouse name: Not on file  . Number of children: Not on file  . Years of education: Not on file  . Highest education level: Not on file  Occupational History  . Not on file  Social Needs  . Financial resource strain: Not on file  . Food insecurity    Worry: Not on file    Inability: Not on file  . Transportation needs    Medical: Not on file    Non-medical: Not on file  Tobacco Use  . Smoking status: Never Smoker  . Smokeless tobacco: Never Used  Substance and Sexual Activity  . Alcohol use: No  . Drug use: No  . Sexual activity: Not on file  Lifestyle  . Physical activity    Days per week: Not on file    Minutes per session: Not on file  . Stress: Not on file  Relationships  . Social Herbalist on phone: Not on file    Gets together: Not on file    Attends religious service: Not on file    Active  member of club or organization: Not on file    Attends meetings of clubs or organizations: Not on file    Relationship status: Not on file  Other Topics Concern  . Not on file  Social History Narrative  . Not on file     Family History: The patient's ***family history includes Cancer in her father; Heart disease in her father and mother. ROS:   Please see the history of present illness.    All other systems reviewed and are negative.  EKGs/Labs/Other Studies Reviewed:    The following studies were reviewed today:  EKG:  EKG ordered today and personally reviewed.  The ekg ordered today demonstrates ***  Recent Labs: No results found for requested labs within last 8760  hours.  Recent Lipid Panel No results found for: CHOL, TRIG, HDL, CHOLHDL, VLDL, LDLCALC, LDLDIRECT  Physical Exam:    VS:  There were no vitals taken for this visit.    Wt Readings from Last 3 Encounters:  10/12/18 254 lb 8 oz (115.4 kg)  04/15/17 252 lb (114.3 kg)     GEN: *** Well nourished, well developed in no acute distress HEENT: Normal NECK: No JVD; No carotid bruits LYMPHATICS: No lymphadenopathy CARDIAC: ***RRR, no murmurs, rubs, gallops RESPIRATORY:  Clear to auscultation without rales, wheezing or rhonchi  ABDOMEN: Soft, non-tender, non-distended MUSCULOSKELETAL:  No edema; No deformity  SKIN: Warm and dry NEUROLOGIC:  Alert and oriented x 3 PSYCHIATRIC:  Normal affect    Signed, Shirlee More, MD  05/06/2019 10:15 AM    [place]

## 2019-05-12 ENCOUNTER — Other Ambulatory Visit: Payer: Self-pay

## 2019-05-12 DIAGNOSIS — E039 Hypothyroidism, unspecified: Secondary | ICD-10-CM | POA: Diagnosis not present

## 2019-05-12 DIAGNOSIS — J9621 Acute and chronic respiratory failure with hypoxia: Secondary | ICD-10-CM | POA: Diagnosis not present

## 2019-05-12 DIAGNOSIS — I110 Hypertensive heart disease with heart failure: Secondary | ICD-10-CM | POA: Diagnosis not present

## 2019-05-12 DIAGNOSIS — E785 Hyperlipidemia, unspecified: Secondary | ICD-10-CM | POA: Diagnosis not present

## 2019-05-12 DIAGNOSIS — E119 Type 2 diabetes mellitus without complications: Secondary | ICD-10-CM | POA: Diagnosis not present

## 2019-05-12 DIAGNOSIS — I5023 Acute on chronic systolic (congestive) heart failure: Secondary | ICD-10-CM | POA: Diagnosis not present

## 2019-05-13 ENCOUNTER — Other Ambulatory Visit: Payer: Self-pay

## 2019-05-13 ENCOUNTER — Other Ambulatory Visit: Payer: Self-pay | Admitting: Sports Medicine

## 2019-05-13 ENCOUNTER — Ambulatory Visit (INDEPENDENT_AMBULATORY_CARE_PROVIDER_SITE_OTHER): Payer: Medicare Other | Admitting: Sports Medicine

## 2019-05-13 ENCOUNTER — Encounter: Payer: Self-pay | Admitting: Sports Medicine

## 2019-05-13 ENCOUNTER — Ambulatory Visit (INDEPENDENT_AMBULATORY_CARE_PROVIDER_SITE_OTHER): Payer: Medicare Other

## 2019-05-13 DIAGNOSIS — M2042 Other hammer toe(s) (acquired), left foot: Secondary | ICD-10-CM | POA: Diagnosis not present

## 2019-05-13 DIAGNOSIS — M2041 Other hammer toe(s) (acquired), right foot: Secondary | ICD-10-CM

## 2019-05-13 DIAGNOSIS — M79674 Pain in right toe(s): Secondary | ICD-10-CM

## 2019-05-13 DIAGNOSIS — M79671 Pain in right foot: Secondary | ICD-10-CM

## 2019-05-13 DIAGNOSIS — E119 Type 2 diabetes mellitus without complications: Secondary | ICD-10-CM | POA: Diagnosis not present

## 2019-05-13 DIAGNOSIS — Z7901 Long term (current) use of anticoagulants: Secondary | ICD-10-CM | POA: Diagnosis not present

## 2019-05-13 DIAGNOSIS — E785 Hyperlipidemia, unspecified: Secondary | ICD-10-CM | POA: Diagnosis not present

## 2019-05-13 DIAGNOSIS — I5023 Acute on chronic systolic (congestive) heart failure: Secondary | ICD-10-CM | POA: Diagnosis not present

## 2019-05-13 DIAGNOSIS — E039 Hypothyroidism, unspecified: Secondary | ICD-10-CM | POA: Diagnosis not present

## 2019-05-13 DIAGNOSIS — M2141 Flat foot [pes planus] (acquired), right foot: Secondary | ICD-10-CM

## 2019-05-13 DIAGNOSIS — E1142 Type 2 diabetes mellitus with diabetic polyneuropathy: Secondary | ICD-10-CM | POA: Diagnosis not present

## 2019-05-13 DIAGNOSIS — M79675 Pain in left toe(s): Secondary | ICD-10-CM | POA: Diagnosis not present

## 2019-05-13 DIAGNOSIS — M2142 Flat foot [pes planus] (acquired), left foot: Secondary | ICD-10-CM

## 2019-05-13 DIAGNOSIS — L97511 Non-pressure chronic ulcer of other part of right foot limited to breakdown of skin: Secondary | ICD-10-CM

## 2019-05-13 DIAGNOSIS — J9621 Acute and chronic respiratory failure with hypoxia: Secondary | ICD-10-CM | POA: Diagnosis not present

## 2019-05-13 DIAGNOSIS — I110 Hypertensive heart disease with heart failure: Secondary | ICD-10-CM | POA: Diagnosis not present

## 2019-05-13 DIAGNOSIS — M79672 Pain in left foot: Secondary | ICD-10-CM

## 2019-05-13 DIAGNOSIS — B351 Tinea unguium: Secondary | ICD-10-CM | POA: Diagnosis not present

## 2019-05-13 NOTE — Progress Notes (Signed)
Subjective: Debra Mcconnell is a 73 y.o. female patient seen in office for evaluation of ulceration of the right fifth toe. Patient has a history of diabetes and a blood glucose level  today of 108.  Patient is changing the dressing using nothing reports that this has started after using a corn pad to the fifth toe then afterwards started to notice some drainage redness swelling denies any warmth or pain to the area.  Denies nausea/fever/vomiting/chills/night sweats/shortness of breath/pain.  Patient also requests her nails to be trimmed at this visit.  Patient has no other pedal complaints at this time.  Review of Systems  All other systems reviewed and are negative.    Patient Active Problem List   Diagnosis Date Noted  . Comprehensive diabetic foot examination, type 2 DM, encounter for ([place]) 11/11/2018  . Chronic anticoagulation 04/16/2017  . Acquired hammer toes of both feet 01/22/2017  . Diabetic polyneuropathy associated with type 2 diabetes mellitus ([place]) 01/22/2017  . Pre-ulcerative corn or callous 01/22/2017  . Essential hypertension 11/13/2015  . Hyperlipemia, retention 11/13/2015  . Atrial flutter ([place]) 01/24/2014  . OSA (obstructive sleep apnea) 01/24/2014  . Stroke ([place]) 01/24/2014  . Cerebral artery occlusion with cerebral infarction ([place]) 12/04/2013  . Arthritis 11/29/2013  . B-complex deficiency 11/29/2013  . Sleep apnea 11/29/2013  . Thyroid disease 11/29/2013  . 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 amLODipine (NORVASC) 10 MG tablet Take 10 mg by mouth daily.    Marland Kitchen apixaban (ELIQUIS) 5 MG TABS tablet Take 1 tablet (5 mg total) by mouth 2 (two) times daily. 180 tablet 3  . atorvastatin (LIPITOR) 40 MG tablet Take 40 mg by mouth daily.    Marland Kitchen buPROPion (WELLBUTRIN XL) 300 MG 24 hr tablet Take 300 mg by mouth daily.    . carvedilol (COREG) 25 MG  tablet Take 25 mg by mouth 2 (two) times daily.    . Cholecalciferol (VITAMIN D) 2000 units CAPS Take 4,000 Units by mouth 2 (two) times daily.    . Cyanocobalamin (VITAMIN B 12 PO) Take 1,000 mcg by mouth daily.    . Exenatide ER 2 MG SRER 2 mg once a week.    . FEROSUL 325 (65 Fe) MG tablet Take 325 mg by mouth 2 (two) times daily.    . furosemide (LASIX) 20 MG tablet Take 20 mg by mouth daily.    Marland Kitchen gabapentin (NEURONTIN) 300 MG capsule Take 300 mg by mouth 3 (three) times daily.  0  . glimepiride (AMARYL) 1 MG tablet Take 1 mg by mouth as directed. Take 2 tablets in the am and 1 tablet in the pm    . hydrALAZINE (APRESOLINE) 50 MG tablet Take 50 mg by mouth 3 (three) times daily.    Marland Kitchen levothyroxine (SYNTHROID, LEVOTHROID) 175 MCG tablet Take 175 mcg by mouth daily.    Marland Kitchen losartan (COZAAR) 100 MG tablet Take 100 mg by mouth daily.  0  . olmesartan (BENICAR) 40 MG tablet     . omeprazole (PRILOSEC) 20 MG capsule TK 1 C PO QD    . potassium chloride SA (KLOR-CON M15) 15 MEQ tablet Take 15 mEq by mouth daily.    Marland Kitchen zolpidem (AMBIEN) 5 MG tablet Take 5 mg by mouth at bedtime as needed for sleep.     No current facility-administered medications on file prior to visit.  No Known Allergies  No results found for this or any previous visit (from the past 2160 hour(s)).  Objective: There were no vitals filed for this visit.  General: Patient is awake, alert, oriented x 3 and in no acute distress.  Dermatology: Skin is warm and dry bilateral with a partial thickness ulceration present  Right fifth toe lateral aspect ulceration measures 0.2 cm x 0.3 cm x 0.1 cm. There is a  Ruptured blister border with a granular base. The ulceration does not  probe to bone. There is no malodor, clear active drainage, focal erythema, minimal edema. No acute signs of infection.  Nails x10 are elongated and thickened consistent with onychomycosis   Vascular: Dorsalis Pedis pulse = 1/4 Bilateral,  Posterior  Tibial pulse = 1/4 Bilateral,  Capillary Fill Time < 5 seconds  Neurologic: Protective sensation absent bilateral using Semmes Weinstein monofilament.  Musculosketal: No Pain with palpation to ulcerated area. No pain with compression to calves bilateral. No symptomatic bony deformities noted bilateral.  However patient does have hammertoes  Xrays, right foot, no bony destruction suggestive of osteomyelitis. No gas in soft tissues.   No results for input(s): GRAMSTAIN, LABORGA in the last 8760 hours.  Assessment and Plan:  Problem List Items Addressed This Visit      Nervous and Auditory   Diabetic polyneuropathy associated with type 2 diabetes mellitus (HCC)     Musculoskeletal and Integument   Acquired hammer toes of both feet     Other   Chronic anticoagulation    Other Visit Diagnoses    Right foot ulcer, limited to breakdown of skin (HCC)    -  Primary   Relevant Orders   WOUND CULTURE   Pes planus of both feet       Pain due to onychomycosis of toenails of both feet           -Examined patient and discussed the progression of the wound and treatment alternatives. -Xrays reviewed - Excisionally dedbrided ulceration at right fifth toe to healthy bleeding borders removing nonviable tissue using a sterile chisel blade. Wound measures post debridement as above. Wound was debrided to the level of the dermis with viable wound base exposed to promote healing. Hemostasis was achieved with manuel pressure. Patient tolerated procedure well without any discomfort or anesthesia necessary for this wound debridement.  -Wound culture obtained will call patient if she needs to start oral antibiotics -Applied Betadine and dry sterile dressing and instructed patient to continue with daily dressings at home consisting of the same -Advised patient to avoid using any corn pads -Mechanically debrided nails x10 using sterile nail nipper without incident -Advised patient to continue with good  supportive shoes and do not rub or irritate the ulcerated area at the fifth toe - Advised patient to go to the ER or return to office if the wound worsens or if constitutional symptoms are present. -Patient to return to office in 2 weeks for follow up care and evaluation or sooner if problems arise.  Landis Martins, DPM

## 2019-05-16 LAB — WOUND CULTURE

## 2019-05-17 ENCOUNTER — Other Ambulatory Visit: Payer: Self-pay | Admitting: Sports Medicine

## 2019-05-17 ENCOUNTER — Telehealth: Payer: Self-pay | Admitting: *Deleted

## 2019-05-17 DIAGNOSIS — H9121 Sudden idiopathic hearing loss, right ear: Secondary | ICD-10-CM

## 2019-05-17 DIAGNOSIS — H903 Sensorineural hearing loss, bilateral: Secondary | ICD-10-CM

## 2019-05-17 DIAGNOSIS — J9621 Acute and chronic respiratory failure with hypoxia: Secondary | ICD-10-CM | POA: Diagnosis not present

## 2019-05-17 DIAGNOSIS — E785 Hyperlipidemia, unspecified: Secondary | ICD-10-CM | POA: Diagnosis not present

## 2019-05-17 DIAGNOSIS — I5023 Acute on chronic systolic (congestive) heart failure: Secondary | ICD-10-CM | POA: Diagnosis not present

## 2019-05-17 DIAGNOSIS — M79671 Pain in right foot: Secondary | ICD-10-CM

## 2019-05-17 DIAGNOSIS — E039 Hypothyroidism, unspecified: Secondary | ICD-10-CM | POA: Diagnosis not present

## 2019-05-17 DIAGNOSIS — I110 Hypertensive heart disease with heart failure: Secondary | ICD-10-CM | POA: Diagnosis not present

## 2019-05-17 DIAGNOSIS — L97511 Non-pressure chronic ulcer of other part of right foot limited to breakdown of skin: Secondary | ICD-10-CM

## 2019-05-17 DIAGNOSIS — E119 Type 2 diabetes mellitus without complications: Secondary | ICD-10-CM | POA: Diagnosis not present

## 2019-05-17 HISTORY — DX: Sudden idiopathic hearing loss, right ear: H91.21

## 2019-05-17 HISTORY — DX: Sensorineural hearing loss, bilateral: H90.3

## 2019-05-17 MED ORDER — CLINDAMYCIN HCL 300 MG PO CAPS: 300.0000 mg | ORAL_CAPSULE | Freq: Three times a day (TID) | ORAL | 0 refills | Status: DC

## 2019-05-17 NOTE — Telephone Encounter (Signed)
Left message on pt's mobile phone to call for lab results and instructions.

## 2019-05-17 NOTE — Telephone Encounter (Signed)
I called Ms Cammie Mcgee with the phone number currently listed, and recording states person could not be contacted by that number. I called [name] again and asked for a proper number to Ms Tilden Fossa stated [phone].

## 2019-05-17 NOTE — Telephone Encounter (Signed)
Left message on pt's home phone informing pt due to the importance I would be leaving instructions on her voicemail to begin clindamycin due to her wound culture is positive for bacteria.

## 2019-05-17 NOTE — Telephone Encounter (Signed)
Pt's friend, [name] states she is pt's friend and was not certain if I could give her pt's information. I told her I did not see her as a person to release information to, but I did see Ms Debra Mcconnell. [name] states [name] is pt's POA.

## 2019-05-17 NOTE — Telephone Encounter (Signed)
-----   Message from Landis Martins, [place] sent at 05/16/2019  8:24 PM EDT ----- Wound culture + send Clindamycin 300mg  TID x 10 days Thanks Dr. Cannon Kettle

## 2019-05-17 NOTE — Telephone Encounter (Signed)
I spoke with pt's POA - Ms Cammie Mcgee and informed of Dr. Leeanne Rio orders and the antibiotic order.

## 2019-05-18 DIAGNOSIS — I110 Hypertensive heart disease with heart failure: Secondary | ICD-10-CM | POA: Diagnosis not present

## 2019-05-18 DIAGNOSIS — J9621 Acute and chronic respiratory failure with hypoxia: Secondary | ICD-10-CM | POA: Diagnosis not present

## 2019-05-18 DIAGNOSIS — E039 Hypothyroidism, unspecified: Secondary | ICD-10-CM | POA: Diagnosis not present

## 2019-05-18 DIAGNOSIS — E119 Type 2 diabetes mellitus without complications: Secondary | ICD-10-CM | POA: Diagnosis not present

## 2019-05-18 DIAGNOSIS — I5023 Acute on chronic systolic (congestive) heart failure: Secondary | ICD-10-CM | POA: Diagnosis not present

## 2019-05-18 DIAGNOSIS — E785 Hyperlipidemia, unspecified: Secondary | ICD-10-CM | POA: Diagnosis not present

## 2019-05-19 ENCOUNTER — Telehealth: Payer: Self-pay

## 2019-05-19 ENCOUNTER — Other Ambulatory Visit: Payer: Self-pay | Admitting: Sports Medicine

## 2019-05-19 DIAGNOSIS — E039 Hypothyroidism, unspecified: Secondary | ICD-10-CM | POA: Diagnosis not present

## 2019-05-19 DIAGNOSIS — I5023 Acute on chronic systolic (congestive) heart failure: Secondary | ICD-10-CM | POA: Diagnosis not present

## 2019-05-19 DIAGNOSIS — E119 Type 2 diabetes mellitus without complications: Secondary | ICD-10-CM | POA: Diagnosis not present

## 2019-05-19 DIAGNOSIS — J9621 Acute and chronic respiratory failure with hypoxia: Secondary | ICD-10-CM | POA: Diagnosis not present

## 2019-05-19 DIAGNOSIS — I110 Hypertensive heart disease with heart failure: Secondary | ICD-10-CM | POA: Diagnosis not present

## 2019-05-19 DIAGNOSIS — E785 Hyperlipidemia, unspecified: Secondary | ICD-10-CM | POA: Diagnosis not present

## 2019-05-19 MED ORDER — CIPROFLOXACIN HCL 500 MG PO TABS: 500.0000 mg | ORAL_TABLET | Freq: Two times a day (BID) | ORAL | 0 refills | Status: DC

## 2019-05-19 NOTE — Telephone Encounter (Signed)
Called pt. And notified of her new rx sent to her pharmacy and about the Dr.'s order. Pt stated understanding

## 2019-05-19 NOTE — Telephone Encounter (Signed)
Pt. Called stating that her new abx (clindamycin) is making her sick to her stomach and would like to know if she can have another Rx. Please advice

## 2019-05-19 NOTE — Telephone Encounter (Signed)
I called in Cipro for her to take. Stop Clindamycin. Also tell patient each day to each a cup of greek yogurt or to take a probiotic to help protect her stomach while on antibiotic. -Dr Chauncey Cruel

## 2019-05-19 NOTE — Progress Notes (Signed)
Stop clindamycin and start Cipro since it was making her sick

## 2019-05-23 DIAGNOSIS — I110 Hypertensive heart disease with heart failure: Secondary | ICD-10-CM | POA: Diagnosis not present

## 2019-05-23 DIAGNOSIS — E039 Hypothyroidism, unspecified: Secondary | ICD-10-CM | POA: Diagnosis not present

## 2019-05-23 DIAGNOSIS — I5023 Acute on chronic systolic (congestive) heart failure: Secondary | ICD-10-CM | POA: Diagnosis not present

## 2019-05-23 DIAGNOSIS — E785 Hyperlipidemia, unspecified: Secondary | ICD-10-CM | POA: Diagnosis not present

## 2019-05-23 DIAGNOSIS — E119 Type 2 diabetes mellitus without complications: Secondary | ICD-10-CM | POA: Diagnosis not present

## 2019-05-23 DIAGNOSIS — J9621 Acute and chronic respiratory failure with hypoxia: Secondary | ICD-10-CM | POA: Diagnosis not present

## 2019-05-24 DIAGNOSIS — J9621 Acute and chronic respiratory failure with hypoxia: Secondary | ICD-10-CM | POA: Diagnosis not present

## 2019-05-24 DIAGNOSIS — I110 Hypertensive heart disease with heart failure: Secondary | ICD-10-CM | POA: Diagnosis not present

## 2019-05-24 DIAGNOSIS — E119 Type 2 diabetes mellitus without complications: Secondary | ICD-10-CM | POA: Diagnosis not present

## 2019-05-24 DIAGNOSIS — E785 Hyperlipidemia, unspecified: Secondary | ICD-10-CM | POA: Diagnosis not present

## 2019-05-24 DIAGNOSIS — E669 Obesity, unspecified: Secondary | ICD-10-CM | POA: Diagnosis not present

## 2019-05-24 DIAGNOSIS — Z7984 Long term (current) use of oral hypoglycemic drugs: Secondary | ICD-10-CM | POA: Diagnosis not present

## 2019-05-24 DIAGNOSIS — Z79899 Other long term (current) drug therapy: Secondary | ICD-10-CM | POA: Diagnosis not present

## 2019-05-24 DIAGNOSIS — Z87891 Personal history of nicotine dependence: Secondary | ICD-10-CM | POA: Diagnosis not present

## 2019-05-24 DIAGNOSIS — Z96653 Presence of artificial knee joint, bilateral: Secondary | ICD-10-CM | POA: Diagnosis not present

## 2019-05-24 DIAGNOSIS — Z8673 Personal history of transient ischemic attack (TIA), and cerebral infarction without residual deficits: Secondary | ICD-10-CM | POA: Diagnosis not present

## 2019-05-24 DIAGNOSIS — I5023 Acute on chronic systolic (congestive) heart failure: Secondary | ICD-10-CM | POA: Diagnosis not present

## 2019-05-24 DIAGNOSIS — E039 Hypothyroidism, unspecified: Secondary | ICD-10-CM | POA: Diagnosis not present

## 2019-05-24 DIAGNOSIS — R269 Unspecified abnormalities of gait and mobility: Secondary | ICD-10-CM | POA: Diagnosis not present

## 2019-05-24 DIAGNOSIS — R2681 Unsteadiness on feet: Secondary | ICD-10-CM | POA: Diagnosis not present

## 2019-05-24 DIAGNOSIS — H9191 Unspecified hearing loss, right ear: Secondary | ICD-10-CM | POA: Diagnosis not present

## 2019-05-24 DIAGNOSIS — Z7901 Long term (current) use of anticoagulants: Secondary | ICD-10-CM | POA: Diagnosis not present

## 2019-05-25 DIAGNOSIS — J9621 Acute and chronic respiratory failure with hypoxia: Secondary | ICD-10-CM | POA: Diagnosis not present

## 2019-05-25 DIAGNOSIS — I5023 Acute on chronic systolic (congestive) heart failure: Secondary | ICD-10-CM | POA: Diagnosis not present

## 2019-05-25 DIAGNOSIS — E039 Hypothyroidism, unspecified: Secondary | ICD-10-CM | POA: Diagnosis not present

## 2019-05-25 DIAGNOSIS — E785 Hyperlipidemia, unspecified: Secondary | ICD-10-CM | POA: Diagnosis not present

## 2019-05-25 DIAGNOSIS — E119 Type 2 diabetes mellitus without complications: Secondary | ICD-10-CM | POA: Diagnosis not present

## 2019-05-25 DIAGNOSIS — I110 Hypertensive heart disease with heart failure: Secondary | ICD-10-CM | POA: Diagnosis not present

## 2019-05-26 ENCOUNTER — Encounter: Payer: Self-pay | Admitting: Sports Medicine

## 2019-05-26 ENCOUNTER — Ambulatory Visit (INDEPENDENT_AMBULATORY_CARE_PROVIDER_SITE_OTHER): Payer: Medicare Other | Admitting: Sports Medicine

## 2019-05-26 ENCOUNTER — Other Ambulatory Visit: Payer: Self-pay

## 2019-05-26 DIAGNOSIS — E1142 Type 2 diabetes mellitus with diabetic polyneuropathy: Secondary | ICD-10-CM

## 2019-05-26 DIAGNOSIS — M2141 Flat foot [pes planus] (acquired), right foot: Secondary | ICD-10-CM

## 2019-05-26 DIAGNOSIS — M2142 Flat foot [pes planus] (acquired), left foot: Secondary | ICD-10-CM

## 2019-05-26 DIAGNOSIS — M2041 Other hammer toe(s) (acquired), right foot: Secondary | ICD-10-CM

## 2019-05-26 DIAGNOSIS — L97511 Non-pressure chronic ulcer of other part of right foot limited to breakdown of skin: Secondary | ICD-10-CM | POA: Diagnosis not present

## 2019-05-26 DIAGNOSIS — M2042 Other hammer toe(s) (acquired), left foot: Secondary | ICD-10-CM

## 2019-05-26 DIAGNOSIS — Z7901 Long term (current) use of anticoagulants: Secondary | ICD-10-CM

## 2019-05-26 NOTE — Progress Notes (Addendum)
Subjective: Debra Mcconnell is a 73 y.o. female patient seen in office for evaluation of ulceration of the right fifth toe. Patient has a history of diabetes and a blood glucose level today not recorded but is increased and she is on prednisone.  Patient is changing the dressing using betadine. Denies nausea/fever/vomiting/chills/night sweats/shortness of breath/pain.  Patient completed ciprofloxacin without any issues based on previous culture results.  Patient has no other pedal complaints at this time.   Patient Active Problem List   Diagnosis Date Noted  . Comprehensive diabetic foot examination, type 2 DM, encounter for ([place]) 11/11/2018  . Chronic anticoagulation 04/16/2017  . Acquired hammer toes of both feet 01/22/2017  . Diabetic polyneuropathy associated with type 2 diabetes mellitus ([place]) 01/22/2017  . Pre-ulcerative corn or callous 01/22/2017  . Essential hypertension 11/13/2015  . Hyperlipemia, retention 11/13/2015  . Atrial flutter ([place]) 01/24/2014  . OSA (obstructive sleep apnea) 01/24/2014  . Stroke ([place]) 01/24/2014  . Cerebral artery occlusion with cerebral infarction ([place]) 12/04/2013  . Arthritis 11/29/2013  . B-complex deficiency 11/29/2013  . Sleep apnea 11/29/2013  . Thyroid disease 11/29/2013  . 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 amLODipine (NORVASC) 10 MG tablet Take 10 mg by mouth daily.    Marland Kitchen apixaban (ELIQUIS) 5 MG TABS tablet Take 1 tablet (5 mg total) by mouth 2 (two) times daily. 180 tablet 3  . atorvastatin (LIPITOR) 40 MG tablet Take 40 mg by mouth daily.    Marland Kitchen buPROPion (WELLBUTRIN XL) 300 MG 24 hr tablet Take 300 mg by mouth daily.    . carvedilol (COREG) 25 MG tablet Take 25 mg by mouth 2 (two) times daily.    . Cholecalciferol (VITAMIN D) 2000 units CAPS Take 4,000 Units by mouth 2 (two) times daily.    . ciprofloxacin (CIPRO)  500 MG tablet Take 1 tablet (500 mg total) by mouth 2 (two) times daily. 20 tablet 0  . clindamycin (CLEOCIN) 300 MG capsule Take 1 capsule (300 mg total) by mouth 3 (three) times daily. 30 capsule 0  . Cyanocobalamin (VITAMIN B 12 PO) Take 1,000 mcg by mouth daily.    . Exenatide ER 2 MG SRER 2 mg once a week.    . FEROSUL 325 (65 Fe) MG tablet Take 325 mg by mouth 2 (two) times daily.    . furosemide (LASIX) 20 MG tablet Take 20 mg by mouth daily.    Marland Kitchen gabapentin (NEURONTIN) 300 MG capsule Take 300 mg by mouth 3 (three) times daily.  0  . glimepiride (AMARYL) 1 MG tablet Take 1 mg by mouth as directed. Take 2 tablets in the am and 1 tablet in the pm    . hydrALAZINE (APRESOLINE) 50 MG tablet Take 50 mg by mouth 3 (three) times daily.    Marland Kitchen levothyroxine (SYNTHROID, LEVOTHROID) 175 MCG tablet Take 175 mcg by mouth daily.    Marland Kitchen losartan (COZAAR) 100 MG tablet Take 100 mg by mouth daily.  0  . olmesartan (BENICAR) 40 MG tablet     . omeprazole (PRILOSEC) 20 MG capsule TK 1 C PO QD    . potassium chloride SA (KLOR-CON M15) 15 MEQ tablet Take 15 mEq by mouth daily.    Marland Kitchen zolpidem (AMBIEN) 5 MG tablet Take 5 mg by mouth at bedtime as needed for sleep.     No current facility-administered medications on file  prior to visit.    No Known Allergies  Recent Results (from the past 2160 hour(s))  WOUND CULTURE     Status: Abnormal   Collection Time: 05/13/19  9:26 AM   Specimen: Foot, Right; Wound   WOUND CULTURE AND SENS  Result Value Ref Range   Gram Stain Result Final report    Organism ID, Bacteria Comment     Comment: Rare white blood cells.   Organism ID, Bacteria Comment     Comment: Few gram positive cocci   Aerobic Bacterial Culture Final report (A)    Organism ID, Bacteria Staphylococcus aureus (A)     Comment: Heavy growth Methicillin resistant (MRSA) Based on resistance to oxacillin this isolate would be resistant to all currently available beta-lactam antimicrobial agents, with  the exception of the newer cephalosporins with anti-MRSA activity, such as Ceftaroline    Antimicrobial Susceptibility Comment     Comment:       ** S = Susceptible; I = Intermediate; R = Resistant **                    P = Positive; N = Negative             MICS are expressed in micrograms per mL    Antibiotic                 RSLT#1    RSLT#2    RSLT#3    RSLT#4 Ciprofloxacin                  S Clindamycin                    S Erythromycin                   S Gentamicin                     S Levofloxacin                   S Linezolid                      S Oxacillin                      R Penicillin                     R Rifampin                       S Tetracycline                   S Trimethoprim/Sulfa             S Vancomycin                     S     Objective: There were no vitals filed for this visit.  General: Patient is awake, alert, oriented x 3 and in no acute distress.  Dermatology: Skin is warm and dry bilateral with a partial thickness ulceration present Right fifth toe lateral aspect ulceration measures 0.1 cm x 0.1 cm x 0.1 cm. There is a granular base and ulceration does notprobe to bone. There is no malodor, clear active drainage, focal erythema, minimal edema. No acute signs of infection.  Nails x10 are elongated and thickened consistent with onychomycosis   Vascular: Dorsalis Pedis  pulse = 1/4 Bilateral,  Posterior Tibial pulse = 1/4 Bilateral,  Capillary Fill Time < 5 seconds  Neurologic: Protective sensation absent bilateral using Semmes Weinstein monofilament.  Musculosketal: No Pain with palpation to ulcerated area right fifth toe. No pain with compression to calves bilateral.  Hammertoe pes planus deformity noted bilateral.  No results for input(s): GRAMSTAIN, LABORGA in the last 8760 hours.  Assessment and Plan:  Problem List Items Addressed This Visit      Nervous and Auditory   Diabetic polyneuropathy associated with type 2 diabetes mellitus  (HCC)     Musculoskeletal and Integument   Acquired hammer toes of both feet     Other   Chronic anticoagulation    Other Visit Diagnoses    Right foot ulcer, limited to breakdown of skin (HCC)    -  Primary   Pes planus of both feet          -Examined patient and discussed the progression of the wound and treatment alternatives. - Excisionally dedbrided ulceration at right fifth toe to healthy bleeding borders removing nonviable tissue using a sterile chisel blade. Wound measures post debridement as above. Wound was debrided to the level of the dermis with viable wound base exposed to promote healing. Hemostasis was achieved with manuel pressure. Patient tolerated procedure well without any discomfort or anesthesia necessary for this wound debridement.  -Applied Betadine and dry sterile dressing and instructed patient to continue with daily dressings at home consisting of the same but may leave open to air since wound is so small - Advised patient to go to the ER or return to office if the wound worsens or if constitutional symptoms are present. -Patient requests diabetic shoes; office to contact primary care for approval / certification;  Office to arrange shoe fitting and dispensing. -Patient to return to office in 4 weeks for follow up wound care and evaluation or sooner if problems arise.  Landis Martins, DPM

## 2019-05-31 DIAGNOSIS — H903 Sensorineural hearing loss, bilateral: Secondary | ICD-10-CM | POA: Diagnosis not present

## 2019-05-31 DIAGNOSIS — H9121 Sudden idiopathic hearing loss, right ear: Secondary | ICD-10-CM | POA: Diagnosis not present

## 2019-06-01 ENCOUNTER — Other Ambulatory Visit: Payer: Medicare Other

## 2019-06-01 DIAGNOSIS — E038 Other specified hypothyroidism: Secondary | ICD-10-CM | POA: Diagnosis not present

## 2019-06-01 DIAGNOSIS — Z6841 Body Mass Index (BMI) 40.0 and over, adult: Secondary | ICD-10-CM | POA: Diagnosis not present

## 2019-06-01 DIAGNOSIS — N183 Chronic kidney disease, stage 3 (moderate): Secondary | ICD-10-CM | POA: Diagnosis not present

## 2019-06-01 DIAGNOSIS — E1129 Type 2 diabetes mellitus with other diabetic kidney complication: Secondary | ICD-10-CM | POA: Diagnosis not present

## 2019-06-01 DIAGNOSIS — I10 Essential (primary) hypertension: Secondary | ICD-10-CM | POA: Diagnosis not present

## 2019-06-01 DIAGNOSIS — E113493 Type 2 diabetes mellitus with severe nonproliferative diabetic retinopathy without macular edema, bilateral: Secondary | ICD-10-CM | POA: Diagnosis not present

## 2019-06-01 DIAGNOSIS — I129 Hypertensive chronic kidney disease with stage 1 through stage 4 chronic kidney disease, or unspecified chronic kidney disease: Secondary | ICD-10-CM | POA: Diagnosis not present

## 2019-06-01 DIAGNOSIS — E1140 Type 2 diabetes mellitus with diabetic neuropathy, unspecified: Secondary | ICD-10-CM | POA: Diagnosis not present

## 2019-06-03 DIAGNOSIS — I5023 Acute on chronic systolic (congestive) heart failure: Secondary | ICD-10-CM | POA: Diagnosis not present

## 2019-06-03 DIAGNOSIS — E039 Hypothyroidism, unspecified: Secondary | ICD-10-CM | POA: Diagnosis not present

## 2019-06-03 DIAGNOSIS — E785 Hyperlipidemia, unspecified: Secondary | ICD-10-CM | POA: Diagnosis not present

## 2019-06-03 DIAGNOSIS — I110 Hypertensive heart disease with heart failure: Secondary | ICD-10-CM | POA: Diagnosis not present

## 2019-06-03 DIAGNOSIS — J9621 Acute and chronic respiratory failure with hypoxia: Secondary | ICD-10-CM | POA: Diagnosis not present

## 2019-06-03 DIAGNOSIS — E119 Type 2 diabetes mellitus without complications: Secondary | ICD-10-CM | POA: Diagnosis not present

## 2019-06-06 DIAGNOSIS — E039 Hypothyroidism, unspecified: Secondary | ICD-10-CM | POA: Diagnosis not present

## 2019-06-06 DIAGNOSIS — I110 Hypertensive heart disease with heart failure: Secondary | ICD-10-CM | POA: Diagnosis not present

## 2019-06-06 DIAGNOSIS — I5023 Acute on chronic systolic (congestive) heart failure: Secondary | ICD-10-CM | POA: Diagnosis not present

## 2019-06-06 DIAGNOSIS — E119 Type 2 diabetes mellitus without complications: Secondary | ICD-10-CM | POA: Diagnosis not present

## 2019-06-06 DIAGNOSIS — E785 Hyperlipidemia, unspecified: Secondary | ICD-10-CM | POA: Diagnosis not present

## 2019-06-06 DIAGNOSIS — J9621 Acute and chronic respiratory failure with hypoxia: Secondary | ICD-10-CM | POA: Diagnosis not present

## 2019-06-07 DIAGNOSIS — E039 Hypothyroidism, unspecified: Secondary | ICD-10-CM | POA: Diagnosis not present

## 2019-06-07 DIAGNOSIS — E785 Hyperlipidemia, unspecified: Secondary | ICD-10-CM | POA: Diagnosis not present

## 2019-06-07 DIAGNOSIS — I110 Hypertensive heart disease with heart failure: Secondary | ICD-10-CM | POA: Diagnosis not present

## 2019-06-07 DIAGNOSIS — E119 Type 2 diabetes mellitus without complications: Secondary | ICD-10-CM | POA: Diagnosis not present

## 2019-06-07 DIAGNOSIS — J9621 Acute and chronic respiratory failure with hypoxia: Secondary | ICD-10-CM | POA: Diagnosis not present

## 2019-06-07 DIAGNOSIS — I5023 Acute on chronic systolic (congestive) heart failure: Secondary | ICD-10-CM | POA: Diagnosis not present

## 2019-06-15 DIAGNOSIS — E785 Hyperlipidemia, unspecified: Secondary | ICD-10-CM | POA: Diagnosis not present

## 2019-06-15 DIAGNOSIS — I5023 Acute on chronic systolic (congestive) heart failure: Secondary | ICD-10-CM | POA: Diagnosis not present

## 2019-06-15 DIAGNOSIS — I110 Hypertensive heart disease with heart failure: Secondary | ICD-10-CM | POA: Diagnosis not present

## 2019-06-15 DIAGNOSIS — J9621 Acute and chronic respiratory failure with hypoxia: Secondary | ICD-10-CM | POA: Diagnosis not present

## 2019-06-15 DIAGNOSIS — E119 Type 2 diabetes mellitus without complications: Secondary | ICD-10-CM | POA: Diagnosis not present

## 2019-06-15 DIAGNOSIS — E039 Hypothyroidism, unspecified: Secondary | ICD-10-CM | POA: Diagnosis not present

## 2019-06-17 DIAGNOSIS — E785 Hyperlipidemia, unspecified: Secondary | ICD-10-CM | POA: Diagnosis not present

## 2019-06-17 DIAGNOSIS — I5023 Acute on chronic systolic (congestive) heart failure: Secondary | ICD-10-CM | POA: Diagnosis not present

## 2019-06-17 DIAGNOSIS — E039 Hypothyroidism, unspecified: Secondary | ICD-10-CM | POA: Diagnosis not present

## 2019-06-17 DIAGNOSIS — E119 Type 2 diabetes mellitus without complications: Secondary | ICD-10-CM | POA: Diagnosis not present

## 2019-06-17 DIAGNOSIS — J9621 Acute and chronic respiratory failure with hypoxia: Secondary | ICD-10-CM | POA: Diagnosis not present

## 2019-06-17 DIAGNOSIS — I110 Hypertensive heart disease with heart failure: Secondary | ICD-10-CM | POA: Diagnosis not present

## 2019-06-22 ENCOUNTER — Other Ambulatory Visit: Payer: Self-pay

## 2019-06-22 ENCOUNTER — Ambulatory Visit: Payer: Medicare Other | Admitting: Orthotics

## 2019-06-22 DIAGNOSIS — E039 Hypothyroidism, unspecified: Secondary | ICD-10-CM | POA: Diagnosis not present

## 2019-06-22 DIAGNOSIS — L97511 Non-pressure chronic ulcer of other part of right foot limited to breakdown of skin: Secondary | ICD-10-CM

## 2019-06-22 DIAGNOSIS — E119 Type 2 diabetes mellitus without complications: Secondary | ICD-10-CM | POA: Diagnosis not present

## 2019-06-22 DIAGNOSIS — E785 Hyperlipidemia, unspecified: Secondary | ICD-10-CM | POA: Diagnosis not present

## 2019-06-22 DIAGNOSIS — E1142 Type 2 diabetes mellitus with diabetic polyneuropathy: Secondary | ICD-10-CM

## 2019-06-22 DIAGNOSIS — M2041 Other hammer toe(s) (acquired), right foot: Secondary | ICD-10-CM

## 2019-06-22 DIAGNOSIS — J9621 Acute and chronic respiratory failure with hypoxia: Secondary | ICD-10-CM | POA: Diagnosis not present

## 2019-06-22 DIAGNOSIS — M2141 Flat foot [pes planus] (acquired), right foot: Secondary | ICD-10-CM

## 2019-06-22 DIAGNOSIS — I5023 Acute on chronic systolic (congestive) heart failure: Secondary | ICD-10-CM | POA: Diagnosis not present

## 2019-06-22 DIAGNOSIS — I110 Hypertensive heart disease with heart failure: Secondary | ICD-10-CM | POA: Diagnosis not present

## 2019-06-22 NOTE — Progress Notes (Signed)

## 2019-06-23 ENCOUNTER — Encounter: Payer: Self-pay | Admitting: Sports Medicine

## 2019-06-23 ENCOUNTER — Other Ambulatory Visit: Payer: Self-pay

## 2019-06-23 ENCOUNTER — Ambulatory Visit (INDEPENDENT_AMBULATORY_CARE_PROVIDER_SITE_OTHER): Payer: Medicare Other | Admitting: Sports Medicine

## 2019-06-23 DIAGNOSIS — L97511 Non-pressure chronic ulcer of other part of right foot limited to breakdown of skin: Secondary | ICD-10-CM

## 2019-06-23 DIAGNOSIS — Z7901 Long term (current) use of anticoagulants: Secondary | ICD-10-CM

## 2019-06-23 DIAGNOSIS — M79674 Pain in right toe(s): Secondary | ICD-10-CM

## 2019-06-23 DIAGNOSIS — B351 Tinea unguium: Secondary | ICD-10-CM | POA: Diagnosis not present

## 2019-06-23 DIAGNOSIS — M2042 Other hammer toe(s) (acquired), left foot: Secondary | ICD-10-CM

## 2019-06-23 DIAGNOSIS — E1142 Type 2 diabetes mellitus with diabetic polyneuropathy: Secondary | ICD-10-CM

## 2019-06-23 DIAGNOSIS — M2041 Other hammer toe(s) (acquired), right foot: Secondary | ICD-10-CM

## 2019-06-23 DIAGNOSIS — M79675 Pain in left toe(s): Secondary | ICD-10-CM

## 2019-06-23 NOTE — Progress Notes (Addendum)
Subjective: Debra Mcconnell is a 73 y.o. female patient seen in office for evaluation of ulceration of the right fifth toe and for nail trim. Patient has a history of diabetes and a blood glucose level today was 89 last A1c 7.  Patient is changing the dressing using betadine. Denies nausea/fever/vomiting/chills/night sweats/shortness of breath/pain.  Patient denies any pain and reports that she does not see anything open anymore at the right fifth toe.  Patient has no other pedal complaints at this time.   Patient Active Problem List   Diagnosis Date Noted  . Comprehensive diabetic foot examination, type 2 DM, encounter for ([place]) 11/11/2018  . Chronic anticoagulation 04/16/2017  . Acquired hammer toes of both feet 01/22/2017  . Diabetic polyneuropathy associated with type 2 diabetes mellitus ([place]) 01/22/2017  . Pre-ulcerative corn or callous 01/22/2017  . Essential hypertension 11/13/2015  . Hyperlipemia, retention 11/13/2015  . Atrial flutter ([place]) 01/24/2014  . OSA (obstructive sleep apnea) 01/24/2014  . Stroke ([place]) 01/24/2014  . Cerebral artery occlusion with cerebral infarction ([place]) 12/04/2013  . Arthritis 11/29/2013  . B-complex deficiency 11/29/2013  . Sleep apnea 11/29/2013  . Thyroid disease 11/29/2013  . 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 amLODipine (NORVASC) 10 MG tablet Take 10 mg by mouth daily.    Marland Kitchen apixaban (ELIQUIS) 5 MG TABS tablet Take 1 tablet (5 mg total) by mouth 2 (two) times daily. 180 tablet 3  . atorvastatin (LIPITOR) 40 MG tablet Take 40 mg by mouth daily.    Marland Kitchen buPROPion (WELLBUTRIN XL) 300 MG 24 hr tablet Take 300 mg by mouth daily.    . carvedilol (COREG) 25 MG tablet Take 25 mg by mouth 2 (two) times daily.    . Cholecalciferol (VITAMIN D) 2000 units CAPS Take 4,000 Units by mouth 2 (two) times daily.    . ciprofloxacin (CIPRO)  500 MG tablet Take 1 tablet (500 mg total) by mouth 2 (two) times daily. 20 tablet 0  . clindamycin (CLEOCIN) 300 MG capsule Take 1 capsule (300 mg total) by mouth 3 (three) times daily. 30 capsule 0  . Cyanocobalamin (VITAMIN B 12 PO) Take 1,000 mcg by mouth daily.    . Exenatide ER 2 MG SRER 2 mg once a week.    . FEROSUL 325 (65 Fe) MG tablet Take 325 mg by mouth 2 (two) times daily.    . furosemide (LASIX) 20 MG tablet Take 20 mg by mouth daily.    Marland Kitchen gabapentin (NEURONTIN) 300 MG capsule Take 300 mg by mouth 3 (three) times daily.  0  . glimepiride (AMARYL) 1 MG tablet Take 1 mg by mouth as directed. Take 2 tablets in the am and 1 tablet in the pm    . hydrALAZINE (APRESOLINE) 50 MG tablet Take 50 mg by mouth 3 (three) times daily.    Marland Kitchen levothyroxine (SYNTHROID, LEVOTHROID) 175 MCG tablet Take 175 mcg by mouth daily.    Marland Kitchen losartan (COZAAR) 100 MG tablet Take 100 mg by mouth daily.  0  . olmesartan (BENICAR) 40 MG tablet     . omeprazole (PRILOSEC) 20 MG capsule TK 1 C PO QD    . potassium chloride SA (KLOR-CON M15) 15 MEQ tablet Take 15 mEq by mouth daily.    Marland Kitchen zolpidem (AMBIEN) 5 MG tablet Take 5 mg by mouth at bedtime as needed for sleep.  No current facility-administered medications on file prior to visit.    No Known Allergies  Recent Results (from the past 2160 hour(s))  WOUND CULTURE     Status: Abnormal   Collection Time: 05/13/19  9:26 AM   Specimen: Foot, Right; Wound   WOUND CULTURE AND SENS  Result Value Ref Range   Gram Stain Result Final report    Organism ID, Bacteria Comment     Comment: Rare white blood cells.   Organism ID, Bacteria Comment     Comment: Few gram positive cocci   Aerobic Bacterial Culture Final report (A)    Organism ID, Bacteria Staphylococcus aureus (A)     Comment: Heavy growth Methicillin resistant (MRSA) Based on resistance to oxacillin this isolate would be resistant to all currently available beta-lactam antimicrobial agents, with  the exception of the newer cephalosporins with anti-MRSA activity, such as Ceftaroline    Antimicrobial Susceptibility Comment     Comment:       ** S = Susceptible; I = Intermediate; R = Resistant **                    P = Positive; N = Negative             MICS are expressed in micrograms per mL    Antibiotic                 RSLT#1    RSLT#2    RSLT#3    RSLT#4 Ciprofloxacin                  S Clindamycin                    S Erythromycin                   S Gentamicin                     S Levofloxacin                   S Linezolid                      S Oxacillin                      R Penicillin                     R Rifampin                       S Tetracycline                   S Trimethoprim/Sulfa             S Vancomycin                     S     Objective: There were no vitals filed for this visit.  General: Patient is awake, alert, oriented x 3 and in no acute distress.  Dermatology: Skin is warm and dry bilateral with a now healed ulceration at the right fifth toe lateral aspect.  There is thickening of the nails with elongation consistent with onychomycosis x10. No acute signs of infection.    Vascular: Dorsalis Pedis pulse = 1/4 Bilateral,  Posterior Tibial pulse = 0/4 due to trace swelling at ankles today bilateral,  Capillary Fill  Time < 5 seconds  Neurologic: Protective sensation absent bilateral using Semmes Weinstein monofilament.  Musculosketal: No Pain with palpation to now healed right fifth toe at previous ulcer. No pain with compression to calves bilateral.  Hammertoe pes planus deformity noted bilateral.  No results for input(s): GRAMSTAIN, LABORGA in the last 8760 hours.  Assessment and Plan:  Problem List Items Addressed This Visit      Endocrine   Diabetic polyneuropathy associated with type 2 diabetes mellitus ([place])     Musculoskeletal and Integument   Acquired hammer toes of both feet     Other   Chronic anticoagulation    Other Visit  Diagnoses    Right foot ulcer, limited to breakdown of skin ([place])    -  Primary   Pain due to onychomycosis of toenails of both feet         -Examined patient -Previous ulceration is healed patient may discontinue use of Betadine at right fifth toe -Mechanically debrided nails x10 using a sterile nail nipper without incident -Applied foot miracle cream for dry skin and gave patient sample -Patient awaiting diabetic shoes was fitted on yesterday with Debra Mcconnell -Patient to return to office in 12 weeks for routine diabetic foot care or sooner if problems arise.  Landis Martins, DPM

## 2019-07-27 ENCOUNTER — Telehealth: Payer: Self-pay | Admitting: Sports Medicine

## 2019-07-27 NOTE — Telephone Encounter (Signed)
Pt left message yesterday wanting to know status of her diabetic shoes.   After checking safestep the shoes are on back order until 1.3.2021. I notified pt and she is wanting to wait. I did offer her to come in and pick out another pair but she is waiting.

## 2019-08-03 ENCOUNTER — Other Ambulatory Visit: Payer: Self-pay

## 2019-08-08 DIAGNOSIS — E113493 Type 2 diabetes mellitus with severe nonproliferative diabetic retinopathy without macular edema, bilateral: Secondary | ICD-10-CM | POA: Diagnosis not present

## 2019-08-08 DIAGNOSIS — H5203 Hypermetropia, bilateral: Secondary | ICD-10-CM | POA: Diagnosis not present

## 2019-09-23 ENCOUNTER — Ambulatory Visit (INDEPENDENT_AMBULATORY_CARE_PROVIDER_SITE_OTHER): Payer: Medicare Other | Admitting: Sports Medicine

## 2019-09-23 ENCOUNTER — Encounter: Payer: Self-pay | Admitting: Sports Medicine

## 2019-09-23 ENCOUNTER — Other Ambulatory Visit: Payer: Self-pay

## 2019-09-23 DIAGNOSIS — Z7901 Long term (current) use of anticoagulants: Secondary | ICD-10-CM

## 2019-09-23 DIAGNOSIS — M79674 Pain in right toe(s): Secondary | ICD-10-CM

## 2019-09-23 DIAGNOSIS — E1142 Type 2 diabetes mellitus with diabetic polyneuropathy: Secondary | ICD-10-CM | POA: Diagnosis not present

## 2019-09-23 DIAGNOSIS — B351 Tinea unguium: Secondary | ICD-10-CM

## 2019-09-23 DIAGNOSIS — M79675 Pain in left toe(s): Secondary | ICD-10-CM | POA: Diagnosis not present

## 2019-09-23 NOTE — Progress Notes (Signed)
Subjective: Debra Mcconnell is a 74 y.o. female patient with history of diabetes who presents to office today complaining of long,mildly painful nails  while ambulating in shoes; unable to trim. Patient states that the glucose reading this morning was not recorded but on yesterday was 89 mg/dl.  Last A1c 7 and last saw PCP Dr. Burnett Sheng 1 month ago.  Patient denies any new changes in medication or new problems.   Patient is assisted by daughter this visit.  Patient Active Problem List   Diagnosis Date Noted  . Comprehensive diabetic foot examination, type 2 DM, encounter for ([place]) 11/11/2018  . Chronic anticoagulation 04/16/2017  . Acquired hammer toes of both feet 01/22/2017  . Diabetic polyneuropathy associated with type 2 diabetes mellitus ([place]) 01/22/2017  . Pre-ulcerative corn or callous 01/22/2017  . Essential hypertension 11/13/2015  . Hyperlipemia, retention 11/13/2015  . Atrial flutter ([place]) 01/24/2014  . OSA (obstructive sleep apnea) 01/24/2014  . Stroke ([place]) 01/24/2014  . Cerebral artery occlusion with cerebral infarction ([place]) 12/04/2013  . Arthritis 11/29/2013  . B-complex deficiency 11/29/2013  . Sleep apnea 11/29/2013  . Thyroid disease 11/29/2013  . Type II diabetes mellitus with ophthalmic manifestations ([place]) 11/29/2013   Current Outpatient Medications on File Prior to Visit  Medication Sig Dispense Refill  . amLODipine (NORVASC) 10 MG tablet Take 10 mg by mouth daily.    Marland Kitchen apixaban (ELIQUIS) 5 MG TABS tablet Take 1 tablet (5 mg total) by mouth 2 (two) times daily. 180 tablet 3  . atorvastatin (LIPITOR) 40 MG tablet Take 40 mg by mouth daily.    Marland Kitchen buPROPion (WELLBUTRIN XL) 300 MG 24 hr tablet Take 300 mg by mouth daily.    . carvedilol (COREG) 25 MG tablet Take 25 mg by mouth 2 (two) times daily.    . Cholecalciferol (VITAMIN D) 2000 units CAPS Take 4,000 Units by mouth 2 (two) times daily.    . ciprofloxacin (CIPRO) 500 MG tablet Take 1 tablet (500 mg total) by mouth 2  (two) times daily. 20 tablet 0  . clindamycin (CLEOCIN) 300 MG capsule Take 1 capsule (300 mg total) by mouth 3 (three) times daily. 30 capsule 0  . Cyanocobalamin (VITAMIN B 12 PO) Take 1,000 mcg by mouth daily.    . Exenatide ER 2 MG SRER 2 mg once a week.    . FEROSUL 325 (65 Fe) MG tablet Take 325 mg by mouth 2 (two) times daily.    . furosemide (LASIX) 20 MG tablet Take 20 mg by mouth daily.    Marland Kitchen gabapentin (NEURONTIN) 300 MG capsule Take 300 mg by mouth 3 (three) times daily.  0  . glimepiride (AMARYL) 1 MG tablet Take 1 mg by mouth as directed. Take 2 tablets in the am and 1 tablet in the pm    . hydrALAZINE (APRESOLINE) 50 MG tablet Take 50 mg by mouth 3 (three) times daily.    Marland Kitchen levothyroxine (SYNTHROID, LEVOTHROID) 175 MCG tablet Take 175 mcg by mouth daily.    Marland Kitchen losartan (COZAAR) 100 MG tablet Take 100 mg by mouth daily.  0  . olmesartan (BENICAR) 40 MG tablet     . omeprazole (PRILOSEC) 20 MG capsule TK 1 C PO QD    . potassium chloride SA (KLOR-CON M15) 15 MEQ tablet Take 15 mEq by mouth daily.    Marland Kitchen zolpidem (AMBIEN) 5 MG tablet Take 5 mg by mouth at bedtime as needed for sleep.     No current facility-administered medications  on file prior to visit.   No Known Allergies  No results found for this or any previous visit (from the past 2160 hour(s)).  Objective: General: Patient is awake, alert, and oriented x 3 and in no acute distress.  Integument: Skin is warm, dry and supple bilateral. Nails are tender, long, thickened and dystrophic with subungual debris, consistent with onychomycosis, 1-5 bilateral.  There is dry blood noted at the right fifth toenail, no signs of infection. No open lesions or preulcerative lesions present bilateral.  Previous ulcerations remain healed.  Remaining integument unremarkable.  Vasculature:  Dorsalis Pedis pulse 1/4 bilateral. Posterior Tibial pulse 0/4 bilateral.  Capillary fill time <5 sec 1-5 bilateral.  No hair growth to the level of the  digits. Temperature gradient within normal limits.  Mild varicosities present bilateral.  Mild edema present bilateral.   Neurology: The patient has absent sensation measured with a 5.07/10g Semmes Weinstein Monofilament at all pedal sites bilateral . Vibratory sensation diminished bilateral with tuning fork. No Babinski sign present bilateral.   Musculoskeletal: Asymptomatic pes planus and hammertoe pedal deformities noted bilateral.  No pain to palpation bilateral to lesser metatarsophalangeal joints. No tenderness with calf compression bilateral.  Assessment and Plan: Problem List Items Addressed This Visit      Endocrine   Diabetic polyneuropathy associated with type 2 diabetes mellitus (HCC)     Other   Chronic anticoagulation    Other Visit Diagnoses    Pain due to onychomycosis of toenails of both feet    -  Primary      -Examined patient. -Discussed and educated patient on diabetic foot care, especially with  regards to the vascular, neurological and musculoskeletal systems.  -Stressed the importance of good glycemic control and the detriment of not  controlling glucose levels in relation to the foot. -Mechanically debrided all nails 1-5 bilateral using sterile nail nipper and filed with dremel without incident  -Advised patient to be careful and avoid bumping or stubbing her toes -Answered all patient questions -Patient to return  in 2.5-3 months for at risk foot care -Patient advised to call the office if any problems or questions arise in the meantime.  Landis Martins, DPM

## 2019-09-30 DIAGNOSIS — R05 Cough: Secondary | ICD-10-CM | POA: Diagnosis not present

## 2019-09-30 DIAGNOSIS — Z20828 Contact with and (suspected) exposure to other viral communicable diseases: Secondary | ICD-10-CM | POA: Diagnosis not present

## 2019-10-04 ENCOUNTER — Inpatient Hospital Stay (HOSPITAL_COMMUNITY): Payer: Medicare Other

## 2019-10-04 ENCOUNTER — Inpatient Hospital Stay (HOSPITAL_COMMUNITY)
Admission: AD | Admit: 2019-10-04 | Discharge: 2019-10-20 | DRG: 207 | Disposition: A | Discharge status: Skilled Nursing Facility | Payer: Medicare Other | Source: Other Acute Inpatient Hospital | Attending: Internal Medicine | Admitting: Internal Medicine

## 2019-10-04 ENCOUNTER — Encounter (HOSPITAL_COMMUNITY): Payer: Self-pay | Admitting: Pulmonary Disease

## 2019-10-04 ENCOUNTER — Telehealth: Payer: Self-pay | Admitting: Cardiology

## 2019-10-04 DIAGNOSIS — F419 Anxiety disorder, unspecified: Secondary | ICD-10-CM | POA: Diagnosis present

## 2019-10-04 DIAGNOSIS — F329 Major depressive disorder, single episode, unspecified: Secondary | ICD-10-CM | POA: Diagnosis present

## 2019-10-04 DIAGNOSIS — I5082 Biventricular heart failure: Secondary | ICD-10-CM | POA: Diagnosis present

## 2019-10-04 DIAGNOSIS — I400 Infective myocarditis: Secondary | ICD-10-CM | POA: Diagnosis present

## 2019-10-04 DIAGNOSIS — R4182 Altered mental status, unspecified: Secondary | ICD-10-CM | POA: Diagnosis not present

## 2019-10-04 DIAGNOSIS — D539 Nutritional anemia, unspecified: Secondary | ICD-10-CM | POA: Diagnosis present

## 2019-10-04 DIAGNOSIS — Z8249 Family history of ischemic heart disease and other diseases of the circulatory system: Secondary | ICD-10-CM

## 2019-10-04 DIAGNOSIS — Z7984 Long term (current) use of oral hypoglycemic drugs: Secondary | ICD-10-CM

## 2019-10-04 DIAGNOSIS — I748 Embolism and thrombosis of other arteries: Secondary | ICD-10-CM | POA: Diagnosis present

## 2019-10-04 DIAGNOSIS — A4189 Other specified sepsis: Secondary | ICD-10-CM | POA: Diagnosis not present

## 2019-10-04 DIAGNOSIS — L8990 Pressure ulcer of unspecified site, unspecified stage: Secondary | ICD-10-CM | POA: Insufficient documentation

## 2019-10-04 DIAGNOSIS — K219 Gastro-esophageal reflux disease without esophagitis: Secondary | ICD-10-CM | POA: Diagnosis present

## 2019-10-04 DIAGNOSIS — I447 Left bundle-branch block, unspecified: Secondary | ICD-10-CM | POA: Diagnosis not present

## 2019-10-04 DIAGNOSIS — I10 Essential (primary) hypertension: Secondary | ICD-10-CM | POA: Diagnosis present

## 2019-10-04 DIAGNOSIS — Z8673 Personal history of transient ischemic attack (TIA), and cerebral infarction without residual deficits: Secondary | ICD-10-CM

## 2019-10-04 DIAGNOSIS — I4892 Unspecified atrial flutter: Secondary | ICD-10-CM | POA: Diagnosis present

## 2019-10-04 DIAGNOSIS — R41841 Cognitive communication deficit: Secondary | ICD-10-CM | POA: Diagnosis not present

## 2019-10-04 DIAGNOSIS — R339 Retention of urine, unspecified: Secondary | ICD-10-CM | POA: Diagnosis not present

## 2019-10-04 DIAGNOSIS — R404 Transient alteration of awareness: Secondary | ICD-10-CM | POA: Diagnosis not present

## 2019-10-04 DIAGNOSIS — I5040 Unspecified combined systolic (congestive) and diastolic (congestive) heart failure: Secondary | ICD-10-CM | POA: Diagnosis not present

## 2019-10-04 DIAGNOSIS — Z4682 Encounter for fitting and adjustment of non-vascular catheter: Secondary | ICD-10-CM | POA: Diagnosis not present

## 2019-10-04 DIAGNOSIS — Z0189 Encounter for other specified special examinations: Secondary | ICD-10-CM

## 2019-10-04 DIAGNOSIS — S3991XA Unspecified injury of abdomen, initial encounter: Secondary | ICD-10-CM | POA: Diagnosis not present

## 2019-10-04 DIAGNOSIS — E1139 Type 2 diabetes mellitus with other diabetic ophthalmic complication: Secondary | ICD-10-CM | POA: Diagnosis present

## 2019-10-04 DIAGNOSIS — I429 Cardiomyopathy, unspecified: Secondary | ICD-10-CM | POA: Diagnosis present

## 2019-10-04 DIAGNOSIS — Z6833 Body mass index (BMI) 33.0-33.9, adult: Secondary | ICD-10-CM | POA: Diagnosis not present

## 2019-10-04 DIAGNOSIS — J1282 Pneumonia due to coronavirus disease 2019: Secondary | ICD-10-CM | POA: Diagnosis present

## 2019-10-04 DIAGNOSIS — E669 Obesity, unspecified: Secondary | ICD-10-CM | POA: Diagnosis present

## 2019-10-04 DIAGNOSIS — A419 Sepsis, unspecified organism: Secondary | ICD-10-CM | POA: Diagnosis not present

## 2019-10-04 DIAGNOSIS — I110 Hypertensive heart disease with heart failure: Secondary | ICD-10-CM | POA: Diagnosis present

## 2019-10-04 DIAGNOSIS — N179 Acute kidney failure, unspecified: Secondary | ICD-10-CM | POA: Diagnosis present

## 2019-10-04 DIAGNOSIS — J9601 Acute respiratory failure with hypoxia: Secondary | ICD-10-CM

## 2019-10-04 DIAGNOSIS — J9691 Respiratory failure, unspecified with hypoxia: Secondary | ICD-10-CM | POA: Diagnosis not present

## 2019-10-04 DIAGNOSIS — J9602 Acute respiratory failure with hypercapnia: Secondary | ICD-10-CM | POA: Diagnosis not present

## 2019-10-04 DIAGNOSIS — G9341 Metabolic encephalopathy: Secondary | ICD-10-CM | POA: Diagnosis present

## 2019-10-04 DIAGNOSIS — D735 Infarction of spleen: Secondary | ICD-10-CM | POA: Diagnosis present

## 2019-10-04 DIAGNOSIS — Z79899 Other long term (current) drug therapy: Secondary | ICD-10-CM

## 2019-10-04 DIAGNOSIS — Z794 Long term (current) use of insulin: Secondary | ICD-10-CM | POA: Diagnosis not present

## 2019-10-04 DIAGNOSIS — R2689 Other abnormalities of gait and mobility: Secondary | ICD-10-CM | POA: Diagnosis not present

## 2019-10-04 DIAGNOSIS — R2681 Unsteadiness on feet: Secondary | ICD-10-CM | POA: Diagnosis not present

## 2019-10-04 DIAGNOSIS — R0600 Dyspnea, unspecified: Secondary | ICD-10-CM | POA: Diagnosis not present

## 2019-10-04 DIAGNOSIS — E872 Acidosis: Secondary | ICD-10-CM | POA: Diagnosis present

## 2019-10-04 DIAGNOSIS — R0902 Hypoxemia: Secondary | ICD-10-CM | POA: Diagnosis not present

## 2019-10-04 DIAGNOSIS — E119 Type 2 diabetes mellitus without complications: Secondary | ICD-10-CM | POA: Diagnosis not present

## 2019-10-04 DIAGNOSIS — H9190 Unspecified hearing loss, unspecified ear: Secondary | ICD-10-CM | POA: Diagnosis present

## 2019-10-04 DIAGNOSIS — E785 Hyperlipidemia, unspecified: Secondary | ICD-10-CM | POA: Diagnosis present

## 2019-10-04 DIAGNOSIS — I5021 Acute systolic (congestive) heart failure: Secondary | ICD-10-CM | POA: Diagnosis present

## 2019-10-04 DIAGNOSIS — Z66 Do not resuscitate: Secondary | ICD-10-CM | POA: Diagnosis not present

## 2019-10-04 DIAGNOSIS — I252 Old myocardial infarction: Secondary | ICD-10-CM | POA: Diagnosis not present

## 2019-10-04 DIAGNOSIS — J9600 Acute respiratory failure, unspecified whether with hypoxia or hypercapnia: Secondary | ICD-10-CM

## 2019-10-04 DIAGNOSIS — R278 Other lack of coordination: Secondary | ICD-10-CM | POA: Diagnosis not present

## 2019-10-04 DIAGNOSIS — G459 Transient cerebral ischemic attack, unspecified: Secondary | ICD-10-CM | POA: Diagnosis not present

## 2019-10-04 DIAGNOSIS — R1312 Dysphagia, oropharyngeal phase: Secondary | ICD-10-CM | POA: Diagnosis not present

## 2019-10-04 DIAGNOSIS — G4730 Sleep apnea, unspecified: Secondary | ICD-10-CM | POA: Diagnosis not present

## 2019-10-04 DIAGNOSIS — Z209 Contact with and (suspected) exposure to unspecified communicable disease: Secondary | ICD-10-CM | POA: Diagnosis not present

## 2019-10-04 DIAGNOSIS — B3322 Viral myocarditis: Secondary | ICD-10-CM

## 2019-10-04 DIAGNOSIS — E039 Hypothyroidism, unspecified: Secondary | ICD-10-CM | POA: Diagnosis present

## 2019-10-04 DIAGNOSIS — R0603 Acute respiratory distress: Secondary | ICD-10-CM | POA: Diagnosis not present

## 2019-10-04 DIAGNOSIS — J80 Acute respiratory distress syndrome: Secondary | ICD-10-CM | POA: Diagnosis present

## 2019-10-04 DIAGNOSIS — Z7401 Bed confinement status: Secondary | ICD-10-CM | POA: Diagnosis not present

## 2019-10-04 DIAGNOSIS — L89301 Pressure ulcer of unspecified buttock, stage 1: Secondary | ICD-10-CM | POA: Diagnosis not present

## 2019-10-04 DIAGNOSIS — Z87891 Personal history of nicotine dependence: Secondary | ICD-10-CM | POA: Diagnosis not present

## 2019-10-04 DIAGNOSIS — R471 Dysarthria and anarthria: Secondary | ICD-10-CM | POA: Diagnosis not present

## 2019-10-04 DIAGNOSIS — E870 Hyperosmolality and hypernatremia: Secondary | ICD-10-CM | POA: Diagnosis present

## 2019-10-04 DIAGNOSIS — U071 COVID-19: Principal | ICD-10-CM | POA: Diagnosis present

## 2019-10-04 DIAGNOSIS — G4733 Obstructive sleep apnea (adult) (pediatric): Secondary | ICD-10-CM | POA: Diagnosis present

## 2019-10-04 DIAGNOSIS — Z8614 Personal history of Methicillin resistant Staphylococcus aureus infection: Secondary | ICD-10-CM

## 2019-10-04 DIAGNOSIS — R570 Cardiogenic shock: Secondary | ICD-10-CM | POA: Diagnosis present

## 2019-10-04 DIAGNOSIS — M6281 Muscle weakness (generalized): Secondary | ICD-10-CM | POA: Diagnosis not present

## 2019-10-04 DIAGNOSIS — R1310 Dysphagia, unspecified: Secondary | ICD-10-CM | POA: Diagnosis present

## 2019-10-04 DIAGNOSIS — R55 Syncope and collapse: Secondary | ICD-10-CM | POA: Diagnosis not present

## 2019-10-04 DIAGNOSIS — Z95828 Presence of other vascular implants and grafts: Secondary | ICD-10-CM

## 2019-10-04 DIAGNOSIS — Z452 Encounter for adjustment and management of vascular access device: Secondary | ICD-10-CM | POA: Diagnosis not present

## 2019-10-04 DIAGNOSIS — J440 Chronic obstructive pulmonary disease with acute lower respiratory infection: Secondary | ICD-10-CM | POA: Diagnosis not present

## 2019-10-04 DIAGNOSIS — E1142 Type 2 diabetes mellitus with diabetic polyneuropathy: Secondary | ICD-10-CM | POA: Diagnosis present

## 2019-10-04 DIAGNOSIS — Z9181 History of falling: Secondary | ICD-10-CM | POA: Diagnosis not present

## 2019-10-04 DIAGNOSIS — I4891 Unspecified atrial fibrillation: Secondary | ICD-10-CM | POA: Diagnosis not present

## 2019-10-04 DIAGNOSIS — L89151 Pressure ulcer of sacral region, stage 1: Secondary | ICD-10-CM | POA: Diagnosis present

## 2019-10-04 DIAGNOSIS — D696 Thrombocytopenia, unspecified: Secondary | ICD-10-CM | POA: Diagnosis present

## 2019-10-04 DIAGNOSIS — M2550 Pain in unspecified joint: Secondary | ICD-10-CM | POA: Diagnosis not present

## 2019-10-04 DIAGNOSIS — Z79890 Hormone replacement therapy (postmenopausal): Secondary | ICD-10-CM

## 2019-10-04 DIAGNOSIS — R7401 Elevation of levels of liver transaminase levels: Secondary | ICD-10-CM | POA: Diagnosis present

## 2019-10-04 DIAGNOSIS — T380X5A Adverse effect of glucocorticoids and synthetic analogues, initial encounter: Secondary | ICD-10-CM | POA: Diagnosis present

## 2019-10-04 DIAGNOSIS — Z7901 Long term (current) use of anticoagulants: Secondary | ICD-10-CM

## 2019-10-04 DIAGNOSIS — J9690 Respiratory failure, unspecified, unspecified whether with hypoxia or hypercapnia: Secondary | ICD-10-CM | POA: Diagnosis not present

## 2019-10-04 DIAGNOSIS — I6350 Cerebral infarction due to unspecified occlusion or stenosis of unspecified cerebral artery: Secondary | ICD-10-CM | POA: Diagnosis not present

## 2019-10-04 DIAGNOSIS — Z9911 Dependence on respirator [ventilator] status: Secondary | ICD-10-CM | POA: Diagnosis not present

## 2019-10-04 DIAGNOSIS — E1165 Type 2 diabetes mellitus with hyperglycemia: Secondary | ICD-10-CM | POA: Diagnosis present

## 2019-10-04 DIAGNOSIS — R918 Other nonspecific abnormal finding of lung field: Secondary | ICD-10-CM | POA: Diagnosis not present

## 2019-10-04 HISTORY — DX: Viral myocarditis: B33.22

## 2019-10-04 HISTORY — DX: COVID-19: U07.1

## 2019-10-04 LAB — POCT I-STAT 7, (LYTES, BLD GAS, ICA,H+H)
Acid-base deficit: 8 mmol/L — ABNORMAL HIGH (ref 0.0–2.0)
Bicarbonate: 18.6 mmol/L — ABNORMAL LOW (ref 20.0–28.0)
Calcium, Ion: 1.17 mmol/L (ref 1.15–1.40)
HCT: 38 % (ref 36.0–46.0)
Hemoglobin: 12.9 g/dL (ref 12.0–15.0)
O2 Saturation: 99 %
Potassium: 4.2 mmol/L (ref 3.5–5.1)
Sodium: 136 mmol/L (ref 135–145)
TCO2: 20 mmol/L — ABNORMAL LOW (ref 22–32)
pCO2 arterial: 40 mmHg (ref 32.0–48.0)
pH, Arterial: 7.275 — ABNORMAL LOW (ref 7.350–7.450)
pO2, Arterial: 162 mmHg — ABNORMAL HIGH (ref 83.0–108.0)

## 2019-10-04 LAB — COMPREHENSIVE METABOLIC PANEL
ALT: 100 U/L — ABNORMAL HIGH (ref 0–44)
AST: 58 U/L — ABNORMAL HIGH (ref 15–41)
Albumin: 2.7 g/dL — ABNORMAL LOW (ref 3.5–5.0)
Alkaline Phosphatase: 67 U/L (ref 38–126)
Anion gap: 15 (ref 5–15)
BUN: 67 mg/dL — ABNORMAL HIGH (ref 8–23)
CO2: 20 mmol/L — ABNORMAL LOW (ref 22–32)
Calcium: 7.5 mg/dL — ABNORMAL LOW (ref 8.9–10.3)
Chloride: 102 mmol/L (ref 98–111)
Creatinine, Ser: 1.88 mg/dL — ABNORMAL HIGH (ref 0.44–1.00)
GFR calc Af Amer: 31 mL/min — ABNORMAL LOW (ref >60)
GFR calc non Af Amer: 26 mL/min — ABNORMAL LOW (ref >60)
Glucose, Bld: 396 mg/dL — ABNORMAL HIGH (ref 70–99)
Potassium: 4.2 mmol/L (ref 3.5–5.1)
Sodium: 137 mmol/L (ref 135–145)
Total Bilirubin: 2.3 mg/dL — ABNORMAL HIGH (ref 0.3–1.2)
Total Protein: 5.3 g/dL — ABNORMAL LOW (ref 6.5–8.1)

## 2019-10-04 LAB — CBC WITH DIFFERENTIAL/PLATELET
Abs Immature Granulocytes: 0.02 10*3/uL (ref 0.00–0.07)
Basophils Absolute: 0 10*3/uL (ref 0.0–0.1)
Basophils Relative: 0 %
Eosinophils Absolute: 0 10*3/uL (ref 0.0–0.5)
Eosinophils Relative: 0 %
HCT: 38.6 % (ref 36.0–46.0)
Hemoglobin: 12.4 g/dL (ref 12.0–15.0)
Immature Granulocytes: 0 %
Lymphocytes Relative: 6 %
Lymphs Abs: 0.3 10*3/uL — ABNORMAL LOW (ref 0.7–4.0)
MCH: 32.7 pg (ref 26.0–34.0)
MCHC: 32.1 g/dL (ref 30.0–36.0)
MCV: 101.8 fL — ABNORMAL HIGH (ref 80.0–100.0)
Monocytes Absolute: 0.2 10*3/uL (ref 0.1–1.0)
Monocytes Relative: 3 %
Neutro Abs: 4.1 10*3/uL (ref 1.7–7.7)
Neutrophils Relative %: 91 %
Platelets: 99 10*3/uL — ABNORMAL LOW (ref 150–400)
RBC: 3.79 MIL/uL — ABNORMAL LOW (ref 3.87–5.11)
RDW: 15.8 % — ABNORMAL HIGH (ref 11.5–15.5)
WBC: 4.5 10*3/uL (ref 4.0–10.5)
nRBC: 0.4 % — ABNORMAL HIGH (ref 0.0–0.2)

## 2019-10-04 LAB — MAGNESIUM: Magnesium: 2 mg/dL (ref 1.7–2.4)

## 2019-10-04 LAB — GLUCOSE, CAPILLARY
Glucose-Capillary: 369 mg/dL — ABNORMAL HIGH (ref 70–99)
Glucose-Capillary: 374 mg/dL — ABNORMAL HIGH (ref 70–99)
Glucose-Capillary: 340 mg/dL — ABNORMAL HIGH (ref 70–99)

## 2019-10-04 LAB — PROCALCITONIN: Procalcitonin: 12.78 ng/mL

## 2019-10-04 LAB — LACTIC ACID, PLASMA: Lactic Acid, Venous: 1.5 mmol/L (ref 0.5–1.9)

## 2019-10-04 LAB — PHOSPHORUS: Phosphorus: 5.2 mg/dL — ABNORMAL HIGH (ref 2.5–4.6)

## 2019-10-04 LAB — BRAIN NATRIURETIC PEPTIDE: B Natriuretic Peptide: 2040.2 pg/mL — ABNORMAL HIGH (ref 0.0–100.0)

## 2019-10-04 MED ORDER — MIDAZOLAM HCL 2 MG/2ML IJ SOLN
1.0000 mg | INTRAMUSCULAR | Status: AC | PRN
  Administered 2019-10-04 – 2019-10-06 (×3): 1 mg via INTRAVENOUS
  Filled 2019-10-04 (×4): qty 2

## 2019-10-04 MED ORDER — DEXMEDETOMIDINE HCL IN NACL 400 MCG/100ML IV SOLN
0.0000 ug/kg/h | INTRAVENOUS | Status: DC
  Administered 2019-10-04: 18:00:00 0.2 ug/kg/h via INTRAVENOUS

## 2019-10-04 MED ORDER — FENTANYL CITRATE (PF) 100 MCG/2ML IJ SOLN
25.0000 ug | Freq: Once | INTRAMUSCULAR | Status: AC
  Administered 2019-10-04: 18:00:00 25 ug via INTRAVENOUS

## 2019-10-04 MED ORDER — DEXAMETHASONE 1 MG/ML PO CONC
6.0000 mg | Freq: Every day | ORAL | Status: DC
  Filled 2019-10-04: qty 6

## 2019-10-04 MED ORDER — INSULIN ASPART 100 UNIT/ML ~~LOC~~ SOLN: 1.0000 [IU] | SUBCUTANEOUS | Status: DC

## 2019-10-04 MED ORDER — FENTANYL 2500MCG IN NS 250ML (10MCG/ML) PREMIX INFUSION
25.0000 ug/h | INTRAVENOUS | Status: DC
  Administered 2019-10-04: 75 ug/h via INTRAVENOUS
  Administered 2019-10-05: 13:00:00 125 ug/h via INTRAVENOUS
  Administered 2019-10-06: 22:00:00 300 ug/h via INTRAVENOUS
  Administered 2019-10-06: 01:00:00 125 ug/h via INTRAVENOUS
  Administered 2019-10-07: 13:00:00 250 ug/h via INTRAVENOUS
  Administered 2019-10-07: 23:00:00 175 ug/h via INTRAVENOUS
  Administered 2019-10-07: 05:00:00 300 ug/h via INTRAVENOUS
  Filled 2019-10-04 (×9): qty 250

## 2019-10-04 MED ORDER — DEXTROSE-NACL 5-0.45 % IV SOLN: INTRAVENOUS | Status: DC

## 2019-10-04 MED ORDER — ALBUMIN HUMAN 5 % IV SOLN
12.5000 g | Freq: Once | INTRAVENOUS | Status: AC
  Administered 2019-10-04: 12.5 g via INTRAVENOUS
  Filled 2019-10-04: qty 250

## 2019-10-04 MED ORDER — FENTANYL BOLUS VIA INFUSION
25.0000 ug | INTRAVENOUS | Status: DC | PRN
  Administered 2019-10-06 (×3): 25 ug via INTRAVENOUS
  Filled 2019-10-04: qty 25

## 2019-10-04 MED ORDER — PHENYLEPHRINE HCL-NACL 10-0.9 MG/250ML-% IV SOLN
50.0000 ug/min | INTRAVENOUS | Status: DC
  Administered 2019-10-04: 20 ug/min via INTRAVENOUS
  Filled 2019-10-04: qty 250

## 2019-10-04 MED ORDER — DOCUSATE SODIUM 50 MG/5ML PO LIQD
100.0000 mg | Freq: Two times a day (BID) | ORAL | Status: DC | PRN
  Administered 2019-10-08 – 2019-10-12 (×3): 100 mg
  Filled 2019-10-04 (×3): qty 10

## 2019-10-04 MED ORDER — ENOXAPARIN SODIUM 30 MG/0.3ML ~~LOC~~ SOLN: 30.0000 mg | SUBCUTANEOUS | Status: DC

## 2019-10-04 MED ORDER — SODIUM BICARBONATE 8.4 % IV SOLN
INTRAVENOUS | Status: AC
  Administered 2019-10-04: 19:00:00 50 meq
  Filled 2019-10-04: qty 100

## 2019-10-04 MED ORDER — SODIUM CHLORIDE 0.9 % IV SOLN
100.0000 mg | Freq: Every day | INTRAVENOUS | Status: AC
  Administered 2019-10-05 – 2019-10-08 (×4): 100 mg via INTRAVENOUS
  Filled 2019-10-04 (×4): qty 20

## 2019-10-04 MED ORDER — PROPOFOL 1000 MG/100ML IV EMUL
5.0000 ug/kg/min | INTRAVENOUS | Status: DC
  Administered 2019-10-04: 21:00:00 10 ug/kg/min via INTRAVENOUS
  Administered 2019-10-05: 12:00:00 5 ug/kg/min via INTRAVENOUS
  Filled 2019-10-04 (×2): qty 100

## 2019-10-04 MED ORDER — MIDAZOLAM HCL 2 MG/2ML IJ SOLN
1.0000 mg | INTRAMUSCULAR | Status: DC | PRN
  Administered 2019-10-06: 08:00:00 1 mg via INTRAVENOUS
  Filled 2019-10-04 (×3): qty 2

## 2019-10-04 MED ORDER — INSULIN REGULAR(HUMAN) IN NACL 100-0.9 UT/100ML-% IV SOLN
INTRAVENOUS | Status: DC
  Administered 2019-10-04: 21:00:00 17 [IU]/h via INTRAVENOUS

## 2019-10-04 MED ORDER — LEVOTHYROXINE SODIUM 75 MCG PO TABS
175.0000 ug | ORAL_TABLET | Freq: Every day | ORAL | Status: DC
  Administered 2019-10-05 – 2019-10-19 (×15): 175 ug
  Filled 2019-10-04 (×15): qty 1

## 2019-10-04 MED ORDER — FAMOTIDINE IN NACL 20-0.9 MG/50ML-% IV SOLN
20.0000 mg | Freq: Two times a day (BID) | INTRAVENOUS | Status: DC
  Administered 2019-10-04: 19:00:00 20 mg via INTRAVENOUS
  Filled 2019-10-04: qty 50

## 2019-10-04 MED ORDER — APIXABAN 5 MG PO TABS
5.0000 mg | ORAL_TABLET | Freq: Two times a day (BID) | ORAL | Status: DC
  Administered 2019-10-04 – 2019-10-05 (×2): 5 mg via ORAL
  Filled 2019-10-04 (×2): qty 1

## 2019-10-04 MED ORDER — DEXTROSE 50 % IV SOLN: 0.0000 mL | INTRAVENOUS | Status: DC | PRN

## 2019-10-04 MED ORDER — SODIUM CHLORIDE 0.9 % IV SOLN: INTRAVENOUS | Status: DC

## 2019-10-04 NOTE — Progress Notes (Signed)
Pt arrived via [facility] from [facility]. Pt with 7.5 ETT at 23cm at the lip. Pt measured and placed on her 7cc/kg vt.  PRVC: 440/24/100%/+12. RT will obtain ABG to assess need for proning/weaning.

## 2019-10-04 NOTE — Progress Notes (Signed)
[facility] Progress Note Patient Name: Debra Mcconnell DOB: 1946-07-11 MRN: [id number]   Date of Service  10/04/2019  HPI/Events of Note  Pt needs closer blood pressure monitoring and blood drawing access.  eICU Interventions  Arterial line ordered.        Kerry Kass Zaelyn Barbary 10/04/2019, 10:04 PM

## 2019-10-04 NOTE — Progress Notes (Signed)
eLink Physician-Brief Progress Note Patient Name: Debra Mcconnell DOB: [birth year] MRN: [id number]   Date of Service  10/04/2019  HPI/Events of Note  Pt admitted earlier today with acute hypoxemic respiratory failure due to COVID-19 pneumonia, she is also in atrial fib-flutter with controlled ventricular response rate. She is hyperglycemic.  eICU Interventions  Phase 2 adult insulin protocol ordered, KUB ordered to check NG tube position.        Kerry Kass Jerrik Housholder 10/04/2019, 7:45 PM

## 2019-10-04 NOTE — Procedures (Signed)
Arterial Catheter Insertion Procedure Note Debra Mcconnell [id number] 1946-02-21  Procedure: Insertion of Arterial Catheter  Indications: Blood pressure monitoring and Frequent blood sampling  Procedure Details Consent: Unable to obtain consent because of altered level of consciousness. Time Out: Verified patient identification, verified procedure, site/side was marked, verified correct patient position, special equipment/implants available, medications/allergies/relevent history reviewed, required imaging and test results available.  Performed  Maximum sterile technique was used including antiseptics, cap, gloves, gown, hand hygiene, mask and sheet. Skin prep: Chlorhexidine; local anesthetic administered 20 gauge catheter was inserted into left radial artery using the Seldinger technique. ULTRASOUND GUIDANCE USED: NO Evaluation Blood flow good; BP tracing good. Complications: No apparent complications.   Debra Mcconnell 10/04/2019

## 2019-10-04 NOTE — H&P (Signed)
NAME:  Debra Mcconnell, MRN:  [id number], DOB:  12-Oct-1945, LOS: 0 ADMISSION DATE:  (Not on file), CONSULTATION DATE:  10/04/19 REFERRING MD:  [facility], CHIEF COMPLAINT:  SOB   Brief History   74 y/o F who presented to [facility] on 1/26 with reports of altered mental status.  Found to be hypoxic and COVID positive. Had worsening respiratory failure requiring intubation.  Patient transferred to [facility] for further care.   History of present illness   74 y/o F who presented to [facility] on 1/26 with reports of altered mental status.   On presentation to the ER, the patient was noted to be altered and hypoxic.  She was treated with oxygen without improvement in mental status or work of breathing. She was found to be COVID positive.  Unfortunately, she required intubation for hypoxic respiratory failure. CXR reportedly demonstrated mucus plugging on the right.  Pulmonology evaluated her there and recommended mucolytic's and chest PT.  Bronchoscopy was deferred due to high PEEP / FiO2 needs.  Labs notable for Cr 1.4, glucose 350, pct 11, LA 3.8. Admit lasb here pending. ctpa from osh negative for PE, segmental occlusion of splenic a. Of undetermined chronicity on chronic eliquis. CTH and cspine without acute process.   The patient was transferred to [facility] on 1/26 for further care.  On arrival she was in slow aflutter with rate of 65. She is unresponsive on sedation on vent.   Past Medical History  DM II  CVA  HTN HLD A-Flutter - CHADS2Vasc score 5 Hypothyroidism  OSA   Significant Hospital Events   1/26 Admit to [facility] from [facility], intubated / COVID +  Consults:     Procedures:  ETT 1/26 >>  Significant Diagnostic Tests:  1/26Cth: negative  1/26Ct cspine: negative 1/26 ctpa: neg for pe. +pneumonia Micro Data:  COVID 1/26 >> positive  Antimicrobials:  remdesivir 1/26->   Interim history/subjective:  As above  Objective   There were no vitals taken  for this visit.       No intake or output data in the 24 hours ending 10/04/19 1446 There were no vitals filed for this visit.  Examination: General:  Sedated unresponsive on vent  HEENT: MM pink/moist, NCAT, ng in place, perrla Neuro: unresponsive on sedation CV: irreg 60's no m/r/g PULM:  diminshed bilaterally GI: obese, soft, bsx4 active  Extremities: warm/dry,  + trace edema  Skin: no rashes or lesions  Resolved Hospital Problem list      Assessment & Plan:   Acute Hypoxemic Respiratory Failure secondary to COVID PNA ARDS -low Vt ventilation 4-8cc/kg -goal plateau pressure <30, driving pressure [id number] cm H2O -target PaO2 55-65, titrate PEEP/FiO2 per ARDS protocol  -if P/F ratio <150, consider prone therapy for 16 hours per day -goal CVP <4, diuresis as necessary -VAP prevention measures  -follow intermittent CXR  -decadron x 10d, remdesivir x 5 day -assess CXR on arrival post transport -assess ABG one hour post arrival   Sedation Needs in setting of Mechanical Ventilation  -PAD Protocol with precedex and fentanyl, prn versed -RASS Goal -2 -delirium prevention measures   Hx HTN, HLD, CVA Hx AFlutter  -ICU monitoring  -continue eliquis  -assess EKG on arrival  - hold home agents > norvasc, lipitor, coreg, lasix, olmesartan, hydralazine  With bp in low 100's map >65  Hx OSA  -consider CPAP post extubation QHS   DM II with Peripheral Neuropathy  Hgb A1c 7  -SSI, will recheck  labs here may need to increase scale  -linagliptin  -hold home amaryl  -continue home gabapentin   Hypothyroidism  -continue synthroid  -assess TSH   GERD -continue PPI, on omeprazole prior to admit   Anxiety / Depression  -continue wellbutrin  -hold home ambien   Hx MRSA Foot Wound Infection  Positive on 05/13/2019 -well healed b/l feet.   Best practice:  Diet: NPO, TF ordered Pain/Anxiety/Delirium protocol (if indicated): per protocol VAP protocol (if indicated): per  protocol DVT prophylaxis: Eliquis  GI prophylaxis: PPI  Glucose control: SSI Mobility: BR  Code Status: full Family Communication: neighbor is contact  Disposition: ICU  Labs   CBC: No results for input(s): WBC, NEUTROABS, HGB, HCT, MCV, PLT in the last 168 hours.  Basic Metabolic Panel: No results for input(s): NA, K, CL, CO2, GLUCOSE, BUN, CREATININE, CALCIUM, MG, PHOS in the last 168 hours. GFR: CrCl cannot be calculated (Patient's most recent lab result is older than the maximum 21 days allowed.). No results for input(s): PROCALCITON, WBC, LATICACIDVEN in the last 168 hours.  Liver Function Tests: No results for input(s): AST, ALT, ALKPHOS, BILITOT, PROT, ALBUMIN in the last 168 hours. No results for input(s): LIPASE, AMYLASE in the last 168 hours. No results for input(s): AMMONIA in the last 168 hours.  ABG No results found for: PHART, PCO2ART, PO2ART, HCO3, TCO2, ACIDBASEDEF, O2SAT   Coagulation Profile: No results for input(s): INR, PROTIME in the last 168 hours.  Cardiac Enzymes: No results for input(s): CKTOTAL, CKMB, CKMBINDEX, TROPONINI in the last 168 hours.  HbA1C: No results found for: HGBA1C  CBG: No results for input(s): GLUCAP in the last 168 hours.  Review of Systems:   Unable to complete as patient is altered on mechanical ventilation.   Past Medical History  She,  has a past medical history of Acquired hammer toes of both feet (01/22/2017), Arthritis (11/29/2013), Atrial flutter ([place]) (01/24/2014), B-complex deficiency (11/29/2013), Cerebral artery occlusion with cerebral infarction ([place]) (12/04/2013), Diabetic polyneuropathy associated with type 2 diabetes mellitus ([place]) (01/22/2017), Essential hypertension (11/13/2015), Hyperlipemia, retention (11/13/2015), OSA (obstructive sleep apnea) (01/24/2014), Pre-ulcerative corn or callous (01/22/2017), Sleep apnea (11/29/2013), Stroke ([place]) (01/24/2014), Thyroid disease (11/29/2013), and Type II diabetes mellitus with  ophthalmic manifestations ([place]) (11/29/2013).   Surgical History    Past Surgical History:  Procedure Laterality Date  . CHOLECYSTECTOMY    . GASTROCYSTOPLASTY    . KNEE SURGERY       Social History   reports that she has never smoked. She has never used smokeless tobacco. She reports that she does not drink alcohol or use drugs.   Family History   Her family history includes Cancer in her father; Heart disease in her father and mother.   Allergies No Known Allergies   Home Medications  Prior to Admission medications   Medication Sig Start Date End Date Taking? Authorizing Provider  amLODipine (NORVASC) 10 MG tablet Take 10 mg by mouth daily.    [provider]  apixaban (ELIQUIS) 5 MG TABS tablet Take 1 tablet (5 mg total) by mouth 2 (two) times daily. 10/12/18   Richardo Priest, MD  atorvastatin (LIPITOR) 40 MG tablet Take 40 mg by mouth daily.    [provider]  buPROPion (WELLBUTRIN XL) 300 MG 24 hr tablet Take 300 mg by mouth daily.    [provider]  carvedilol (COREG) 25 MG tablet Take 25 mg by mouth 2 (two) times daily.    [provider]  Cholecalciferol (  VITAMIN D) 2000 units CAPS Take 4,000 Units by mouth 2 (two) times daily.    [provider]  ciprofloxacin (CIPRO) 500 MG tablet Take 1 tablet (500 mg total) by mouth 2 (two) times daily. 05/19/19   Landis Martins, DPM  clindamycin (CLEOCIN) 300 MG capsule Take 1 capsule (300 mg total) by mouth 3 (three) times daily. 05/17/19   Landis Martins, DPM  Cyanocobalamin (VITAMIN B 12 PO) Take 1,000 mcg by mouth daily.    [provider]  Exenatide ER 2 MG SRER 2 mg once a week.    [provider]  FEROSUL 325 (65 Fe) MG tablet Take 325 mg by mouth 2 (two) times daily. 03/23/19   [provider]  furosemide (LASIX) 20 MG tablet Take 20 mg by mouth daily.    [provider]  gabapentin (NEURONTIN) 300 MG capsule Take 300 mg by mouth 3 (three) times  daily. 03/17/17   [provider]  glimepiride (AMARYL) 1 MG tablet Take 1 mg by mouth as directed. Take 2 tablets in the am and 1 tablet in the pm 12/22/16   [provider]  hydrALAZINE (APRESOLINE) 50 MG tablet Take 50 mg by mouth 3 (three) times daily.    [provider]  levothyroxine (SYNTHROID, LEVOTHROID) 175 MCG tablet Take 175 mcg by mouth daily.    [provider]  losartan (COZAAR) 100 MG tablet Take 100 mg by mouth daily. 04/10/17   [provider]  olmesartan (BENICAR) 40 MG tablet  03/03/19   [provider]  omeprazole (PRILOSEC) 20 MG capsule TK 1 C PO QD 04/26/19   [provider]  potassium chloride SA (KLOR-CON M15) 15 MEQ tablet Take 15 mEq by mouth daily.    [provider]  zolpidem (AMBIEN) 5 MG tablet Take 5 mg by mouth at bedtime as needed for sleep.    [provider]     Critical care time: 74    Critical care time: The patient is critically ill with multiple organ systems failure and requires high complexity decision making for assessment and support, frequent evaluation and titration of therapies, application of advanced monitoring technologies and extensive interpretation of multiple databases.  Critical care time 54 mins. This represents my time independent of the NPs time taking care of the pt. This is excluding procedures.    [facility] 10/04/2019, 3:27 PM

## 2019-10-04 NOTE — Progress Notes (Signed)
[facility] Progress Note Patient Name: Debra Mcconnell DOB: 1946-07-29 MRN: [id number]   Date of Service  10/04/2019  HPI/Events of Note  Hypotension  eICU Interventions  Albumin 5 % 250 ml iv x 1, Low dose phenylephrine infusion targeting MAP of 65 mmHg.        Kerry Kass Darold Miley 10/04/2019, 11:08 PM

## 2019-10-04 NOTE — Progress Notes (Signed)
ABG results given to Dr. Ruthann Cancer. No need for proning at this time, FiO2 decreased to 80%. RT will continue to monitor.

## 2019-10-05 ENCOUNTER — Encounter (HOSPITAL_COMMUNITY): Payer: Self-pay | Admitting: Critical Care Medicine

## 2019-10-05 ENCOUNTER — Other Ambulatory Visit: Payer: Self-pay

## 2019-10-05 ENCOUNTER — Inpatient Hospital Stay (HOSPITAL_COMMUNITY): Payer: Medicare Other

## 2019-10-05 DIAGNOSIS — R570 Cardiogenic shock: Secondary | ICD-10-CM

## 2019-10-05 DIAGNOSIS — L8990 Pressure ulcer of unspecified site, unspecified stage: Secondary | ICD-10-CM

## 2019-10-05 DIAGNOSIS — I5082 Biventricular heart failure: Secondary | ICD-10-CM

## 2019-10-05 DIAGNOSIS — U071 COVID-19: Principal | ICD-10-CM

## 2019-10-05 DIAGNOSIS — J80 Acute respiratory distress syndrome: Secondary | ICD-10-CM

## 2019-10-05 DIAGNOSIS — J9601 Acute respiratory failure with hypoxia: Secondary | ICD-10-CM

## 2019-10-05 HISTORY — DX: Pressure ulcer of unspecified site, unspecified stage: L89.90

## 2019-10-05 LAB — POCT I-STAT EG7
Acid-base deficit: 2 mmol/L (ref 0.0–2.0)
Bicarbonate: 24 mmol/L (ref 20.0–28.0)
Calcium, Ion: 1.18 mmol/L (ref 1.15–1.40)
HCT: 38 % (ref 36.0–46.0)
Hemoglobin: 12.9 g/dL (ref 12.0–15.0)
O2 Saturation: 72 %
Patient temperature: 99.8
Potassium: 3.7 mmol/L (ref 3.5–5.1)
Sodium: 145 mmol/L (ref 135–145)
TCO2: 25 mmol/L (ref 22–32)
pCO2, Ven: 46.7 mmHg (ref 44.0–60.0)
pH, Ven: 7.321 (ref 7.250–7.430)
pO2, Ven: 43 mmHg (ref 32.0–45.0)

## 2019-10-05 LAB — URINALYSIS, ROUTINE W REFLEX MICROSCOPIC
Bacteria, UA: NONE SEEN
Bilirubin Urine: NEGATIVE
Glucose, UA: NEGATIVE mg/dL
Ketones, ur: NEGATIVE mg/dL
Leukocytes,Ua: NEGATIVE
Nitrite: NEGATIVE
Protein, ur: 100 mg/dL — AB
Specific Gravity, Urine: 1.033 — ABNORMAL HIGH (ref 1.005–1.030)
pH: 5 (ref 5.0–8.0)

## 2019-10-05 LAB — BASIC METABOLIC PANEL
Anion gap: 10 (ref 5–15)
BUN: 72 mg/dL — ABNORMAL HIGH (ref 8–23)
CO2: 23 mmol/L (ref 22–32)
Calcium: 8.2 mg/dL — ABNORMAL LOW (ref 8.9–10.3)
Chloride: 110 mmol/L (ref 98–111)
Creatinine, Ser: 1.89 mg/dL — ABNORMAL HIGH (ref 0.44–1.00)
GFR calc Af Amer: 30 mL/min — ABNORMAL LOW (ref >60)
GFR calc non Af Amer: 26 mL/min — ABNORMAL LOW (ref >60)
Glucose, Bld: 115 mg/dL — ABNORMAL HIGH (ref 70–99)
Potassium: 3.9 mmol/L (ref 3.5–5.1)
Sodium: 143 mmol/L (ref 135–145)

## 2019-10-05 LAB — CBC
HCT: 38.1 % (ref 36.0–46.0)
Hemoglobin: 12 g/dL (ref 12.0–15.0)
MCH: 31.9 pg (ref 26.0–34.0)
MCHC: 31.5 g/dL (ref 30.0–36.0)
MCV: 101.3 fL — ABNORMAL HIGH (ref 80.0–100.0)
Platelets: 105 10*3/uL — ABNORMAL LOW (ref 150–400)
RBC: 3.76 MIL/uL — ABNORMAL LOW (ref 3.87–5.11)
RDW: 15.6 % — ABNORMAL HIGH (ref 11.5–15.5)
WBC: 6.1 10*3/uL (ref 4.0–10.5)
nRBC: 0 % (ref 0.0–0.2)

## 2019-10-05 LAB — APTT
aPTT: 33 seconds (ref 24–36)
aPTT: 34 seconds (ref 24–36)

## 2019-10-05 LAB — GLUCOSE, CAPILLARY
Glucose-Capillary: 138 mg/dL — ABNORMAL HIGH (ref 70–99)
Glucose-Capillary: 139 mg/dL — ABNORMAL HIGH (ref 70–99)
Glucose-Capillary: 181 mg/dL — ABNORMAL HIGH (ref 70–99)
Glucose-Capillary: 301 mg/dL — ABNORMAL HIGH (ref 70–99)
Glucose-Capillary: 131 mg/dL — ABNORMAL HIGH (ref 70–99)
Glucose-Capillary: 127 mg/dL — ABNORMAL HIGH (ref 70–99)
Glucose-Capillary: 78 mg/dL (ref 70–99)
Glucose-Capillary: 74 mg/dL (ref 70–99)
Glucose-Capillary: 127 mg/dL — ABNORMAL HIGH (ref 70–99)
Glucose-Capillary: 171 mg/dL — ABNORMAL HIGH (ref 70–99)

## 2019-10-05 LAB — LACTIC ACID, PLASMA
Lactic Acid, Venous: 3 mmol/L (ref 0.5–1.9)
Lactic Acid, Venous: 1.4 mmol/L (ref 0.5–1.9)
Lactic Acid, Venous: 1 mmol/L (ref 0.5–1.9)

## 2019-10-05 LAB — TYPE AND SCREEN
ABO/RH(D): A POS
Antibody Screen: NEGATIVE

## 2019-10-05 LAB — TSH: TSH: 0.792 u[IU]/mL (ref 0.350–4.500)

## 2019-10-05 LAB — TRIGLYCERIDES: Triglycerides: 148 mg/dL (ref <150)

## 2019-10-05 LAB — ABO/RH: ABO/RH(D): A POS

## 2019-10-05 LAB — CORTISOL: Cortisol, Plasma: 71.9 ug/dL

## 2019-10-05 LAB — HEPARIN LEVEL (UNFRACTIONATED): Heparin Unfractionated: >2.2 IU/mL — ABNORMAL HIGH (ref 0.30–0.70)

## 2019-10-05 LAB — TROPONIN I (HIGH SENSITIVITY): Troponin I (High Sensitivity): 303 ng/L (ref <18)

## 2019-10-05 LAB — PROTIME-INR
INR: 1.4 — ABNORMAL HIGH (ref 0.8–1.2)
INR: 1.5 — ABNORMAL HIGH (ref 0.8–1.2)
Prothrombin Time: 16.8 seconds — ABNORMAL HIGH (ref 11.4–15.2)
Prothrombin Time: 18.2 seconds — ABNORMAL HIGH (ref 11.4–15.2)

## 2019-10-05 LAB — D-DIMER, QUANTITATIVE: D-Dimer, Quant: 2.37 ug/mL-FEU — ABNORMAL HIGH (ref 0.00–0.50)

## 2019-10-05 LAB — SEDIMENTATION RATE: Sed Rate: 16 mm/hr (ref 0–22)

## 2019-10-05 LAB — MAGNESIUM: Magnesium: 2.1 mg/dL (ref 1.7–2.4)

## 2019-10-05 LAB — C-REACTIVE PROTEIN: CRP: 5.2 mg/dL — ABNORMAL HIGH (ref <1.0)

## 2019-10-05 LAB — PHOSPHORUS: Phosphorus: 4.1 mg/dL (ref 2.5–4.6)

## 2019-10-05 LAB — MRSA PCR SCREENING: MRSA by PCR: NEGATIVE

## 2019-10-05 MED ORDER — DEXAMETHASONE 6 MG PO TABS
6.0000 mg | ORAL_TABLET | Freq: Every day | ORAL | Status: AC
  Administered 2019-10-05 – 2019-10-13 (×9): 6 mg
  Filled 2019-10-05 (×9): qty 1

## 2019-10-05 MED ORDER — INSULIN ASPART 100 UNIT/ML ~~LOC~~ SOLN
5.0000 [IU] | SUBCUTANEOUS | Status: DC
  Administered 2019-10-05 – 2019-10-10 (×32): 5 [IU] via SUBCUTANEOUS

## 2019-10-05 MED ORDER — VITAL AF 1.2 CAL PO LIQD
1000.0000 mL | ORAL | Status: DC
  Administered 2019-10-05 – 2019-10-11 (×6): 1000 mL

## 2019-10-05 MED ORDER — CHLORHEXIDINE GLUCONATE 0.12% ORAL RINSE (MEDLINE KIT)
15.0000 mL | Freq: Two times a day (BID) | OROMUCOSAL | Status: DC
  Administered 2019-10-05 – 2019-10-10 (×11): 15 mL via OROMUCOSAL

## 2019-10-05 MED ORDER — HEPARIN (PORCINE) 25000 UT/250ML-% IV SOLN
1300.0000 [IU]/h | INTRAVENOUS | Status: DC
  Administered 2019-10-05: 22:00:00 1300 [IU]/h via INTRAVENOUS
  Filled 2019-10-05: qty 250

## 2019-10-05 MED ORDER — FAMOTIDINE IN NACL 20-0.9 MG/50ML-% IV SOLN
20.0000 mg | INTRAVENOUS | Status: DC
  Administered 2019-10-05: 20 mg via INTRAVENOUS
  Filled 2019-10-05: qty 50

## 2019-10-05 MED ORDER — FAMOTIDINE 40 MG/5ML PO SUSR
20.0000 mg | Freq: Every day | ORAL | Status: DC
  Administered 2019-10-06 – 2019-10-19 (×14): 20 mg
  Filled 2019-10-05 (×14): qty 2.5

## 2019-10-05 MED ORDER — ATROPINE SULFATE 1 MG/10ML IJ SOSY
0.4000 mg | PREFILLED_SYRINGE | INTRAMUSCULAR | Status: DC | PRN
  Filled 2019-10-05 (×4): qty 10

## 2019-10-05 MED ORDER — DOPAMINE-DEXTROSE 3.2-5 MG/ML-% IV SOLN
1.5000 ug/kg/min | INTRAVENOUS | Status: AC
  Administered 2019-10-05 (×2): 1.5 ug/kg/min via INTRAVENOUS
  Filled 2019-10-05: qty 250

## 2019-10-05 MED ORDER — INSULIN ASPART 100 UNIT/ML ~~LOC~~ SOLN
2.0000 [IU] | SUBCUTANEOUS | Status: DC
  Administered 2019-10-05 (×3): 2 [IU] via SUBCUTANEOUS
  Administered 2019-10-05 – 2019-10-06 (×6): 4 [IU] via SUBCUTANEOUS
  Administered 2019-10-06: 05:00:00 2 [IU] via SUBCUTANEOUS
  Administered 2019-10-06: 16:00:00 4 [IU] via SUBCUTANEOUS
  Administered 2019-10-07: 23:00:00 6 [IU] via SUBCUTANEOUS
  Administered 2019-10-07 (×3): 4 [IU] via SUBCUTANEOUS
  Administered 2019-10-07 (×2): 2 [IU] via SUBCUTANEOUS
  Administered 2019-10-08 (×2): 4 [IU] via SUBCUTANEOUS
  Administered 2019-10-08: 6 [IU] via SUBCUTANEOUS
  Administered 2019-10-08 (×2): 4 [IU] via SUBCUTANEOUS
  Administered 2019-10-09 (×2): 2 [IU] via SUBCUTANEOUS
  Administered 2019-10-09 (×2): 4 [IU] via SUBCUTANEOUS
  Administered 2019-10-10 (×2): 2 [IU] via SUBCUTANEOUS

## 2019-10-05 MED ORDER — VASOPRESSIN 20 UNIT/ML IV SOLN
0.0300 [IU]/min | INTRAVENOUS | Status: DC
  Administered 2019-10-05 – 2019-10-07 (×3): 0.03 [IU]/min via INTRAVENOUS
  Filled 2019-10-05 (×5): qty 2

## 2019-10-05 MED ORDER — DEXTROSE 10 % IV SOLN: INTRAVENOUS | Status: DC | PRN

## 2019-10-05 MED ORDER — SODIUM CHLORIDE 0.9 % IV SOLN
250.0000 mL | INTRAVENOUS | Status: DC
  Administered 2019-10-05: 12:00:00 250 mL via INTRAVENOUS

## 2019-10-05 MED ORDER — INSULIN DETEMIR 100 UNIT/ML ~~LOC~~ SOLN
15.0000 [IU] | Freq: Two times a day (BID) | SUBCUTANEOUS | Status: DC
  Administered 2019-10-05 – 2019-10-08 (×8): 15 [IU] via SUBCUTANEOUS
  Filled 2019-10-05 (×9): qty 0.15

## 2019-10-05 MED ORDER — ALBUMIN HUMAN 5 % IV SOLN
12.5000 g | Freq: Once | INTRAVENOUS | Status: DC
  Filled 2019-10-05: qty 250

## 2019-10-05 MED ORDER — PRO-STAT SUGAR FREE PO LIQD
30.0000 mL | Freq: Two times a day (BID) | ORAL | Status: DC
  Administered 2019-10-05 – 2019-10-12 (×15): 30 mL
  Filled 2019-10-05 (×15): qty 30

## 2019-10-05 MED ORDER — DOPAMINE-DEXTROSE 3.2-5 MG/ML-% IV SOLN
2.5000 ug/kg/min | INTRAVENOUS | Status: DC
  Administered 2019-10-05: 2.5 ug/kg/min via INTRAVENOUS
  Filled 2019-10-05: qty 250

## 2019-10-05 MED ORDER — ORAL CARE MOUTH RINSE
15.0000 mL | OROMUCOSAL | Status: DC
  Administered 2019-10-05 – 2019-10-20 (×137): 15 mL via OROMUCOSAL

## 2019-10-05 MED ORDER — CHLORHEXIDINE GLUCONATE CLOTH 2 % EX PADS
6.0000 | MEDICATED_PAD | Freq: Every day | CUTANEOUS | Status: DC
  Administered 2019-10-05 – 2019-10-20 (×13): 6 via TOPICAL

## 2019-10-05 MED ORDER — MIDAZOLAM HCL 2 MG/2ML IJ SOLN
2.0000 mg | Freq: Once | INTRAMUSCULAR | Status: AC
  Administered 2019-10-05: 16:00:00 2 mg via INTRAVENOUS

## 2019-10-05 MED ORDER — VITAL HIGH PROTEIN PO LIQD
1000.0000 mL | ORAL | Status: DC
  Administered 2019-10-05: 1000 mL

## 2019-10-05 MED ORDER — ATROPINE SULFATE 1 MG/10ML IJ SOSY
PREFILLED_SYRINGE | INTRAMUSCULAR | Status: AC
  Administered 2019-10-05: 1 mg via INTRAVENOUS
  Filled 2019-10-05: qty 10

## 2019-10-05 MED ORDER — NOREPINEPHRINE 4 MG/250ML-% IV SOLN
2.0000 ug/min | INTRAVENOUS | Status: DC
  Administered 2019-10-05: 12:00:00 2 ug/min via INTRAVENOUS
  Filled 2019-10-05: qty 250

## 2019-10-05 NOTE — Progress Notes (Addendum)
Initial Nutrition Assessment  RD working remotely.  DOCUMENTATION CODES:   Obesity unspecified  INTERVENTION:   Tube feeding via post-pyloric Cortrak: - Vital AF 1.2 @ 55 ml/hr (1320 ml/day) - Pro-stat 30 ml BID  Tube feeding regimen provides 1784 kcal, 129 grams of protein, and 1071 ml of H2O.   Tube feeding regimen and current propofol provides 1855 total kcal (102% of needs).  NUTRITION DIAGNOSIS:   Increased nutrient needs related to acute illness, catabolic illness ([id number]) as evidenced by estimated needs.  GOAL:   Patient will meet greater than or equal to 90% of their needs  MONITOR:   Vent status, Labs, Skin, Weight trends, TF tolerance  REASON FOR ASSESSMENT:   Consult, Ventilator Enteral/tube feeding initiation and management  ASSESSMENT:   74 year old female who presented on 1/26 as a transfer from [facility] where she presented with AMS. Pt found to be hypoxic and COVID-19 positive and required intubation. PMH of T2DM, CVA, HTN, HLD, OSA.   01/26 - admit to [facility] from [place], intubated, COVID-19 positive 01/27 - post-pyloric Cortrak tube placed  Pt with acute hypoxic respiratory failure secondary to COVID-19 pneumonia and ARDS.  Adult ICU Tube Feeding Protocol ordered and infusing via post-pyloric Cortrak. RD to adjust to better meet pt's needs.  NG tube removed for Cortrak placement.  Per flowsheet documentation, weight of 90.7 kg is a "stated weight per [facility]."  If weight of 90.7 kg is accurate, pt has experienced a 24.7 kg weight loss over the last year. This is a 21.4% weight loss which is significant for timeframe. Unable to obtain diet and weight history at this time.  Patient is currently intubated on ventilator support MV: 10.5 L/min Temp (24hrs), Avg:98.4 F (36.9 C), Min:97.9 F (36.6 C), Max:99 F (37.2 C) BP (a-line): 158/82 MAP (a-line): 104  Drips: Propofol: 2.7 ml/hr (provides 71 kcal daily from  lipid) Fentanyl: 12.5 ml/hr Levophed: 7.5 ml/hr Dopamine NS: 10 ml/hr  Medications reviewed and include: decadron, pepcid, SSI q 4 hours, Novolog 5 units q 4 hours, Levemir 15 units aq 12 hours, remdesivir  Labs reviewed: BUN 72, creatinine 1.89, elevated LFTs CBG's: 74-374 x 24 hours  UOP: 675 ml x 24 hours  NUTRITION - FOCUSED PHYSICAL EXAM:  Unable to complete at this time. RD working remotely.  Diet Order:   Diet Order            Diet NPO time specified  Diet effective now              EDUCATION NEEDS:   No education needs have been identified at this time  Skin:  Skin Assessment: Skin Integrity Issues: Stage I: sacrum  Last BM:  no documented BM  Height:   Ht Readings from Last 1 Encounters:  10/04/19 5\' 8"  (1.727 m)    Weight:   Wt Readings from Last 1 Encounters:  10/04/19 90.7 kg    Ideal Body Weight:  63.6 kg  BMI:  Body mass index is 30.4 kg/m.  Estimated Nutritional Needs:   Kcal:  [id number] (PSU 2003b), [id number] (20 kcal/kg)  Protein:  120-140 grams  Fluid:  >/= 1.8 L    Gaynell Face, MS, RD, LDN Inpatient Clinical Dietitian Pager: [phone] Weekend/After Hours: [phone]

## 2019-10-05 NOTE — Progress Notes (Signed)
[facility] Progress Note Patient Name: Debra Mcconnell DOB: 10/01/1945 MRN: [id number]   Date of Service  10/05/2019  HPI/Events of Note  Blood sugar down to 100's on POC testing.  eICU Interventions  Will order phase 3 insulin orders.        Larkin Morelos U Purvi Ruehl 10/05/2019, 1:25 AM

## 2019-10-05 NOTE — Progress Notes (Signed)
Updated HCPOA [name] tonight.

## 2019-10-05 NOTE — Progress Notes (Addendum)
NAME:  Debra Mcconnell, MRN:  [id number], DOB:  08-23-1946, LOS: 1 ADMISSION DATE:  10/04/2019, CONSULTATION DATE:  10/04/19 REFERRING MD:  [facility], CHIEF COMPLAINT:  SOB   Brief History   74 y/o F who presented to [facility] on 1/26 with reports of altered mental status.  Found to be hypoxic and COVID positive with AKI, hyperglycemia.  CTA chest negative for PE. CT head negative. Had worsening respiratory failure requiring intubation.  Patient transferred to [facility] for further care.   Past Medical History  DM II  CVA  HTN HLD A-Flutter - CHADS2Vasc score 5 Hypothyroidism  OSA   Significant Hospital Events   1/26 Admit to [facility] from [facility], intubated / COVID +  Consults:     Procedures:  ETT 1/26 >>  Significant Diagnostic Tests:  CT Head (OSH) 1/26 >>  CT C-Spine (OSH) 1/26 >>  CTA Chest 1/26 >> negative for PE, +pneumonia ECHO 1/27 >> LVEF 25-30%, global LV hypokinesis, LA severely dilated, RA moderately dilated, evidence of biventricular failure with elevated RA pressure  Micro Data:  COVID 1/26 >> positive  Antimicrobials:  Remdesivir 1/26 >> Decadron 1/26 >>   Interim history/subjective:  Tmax 99 PEEP 12, FiO2 60%, peak 24, Pplat 22 Glucose range 78 -181 I/O - 644ml UOP, +295 in last 24h RN reports pt remains on low dose dopamine. Did not tolerate neo due to brady and levo had PVC's.  On insulin gtt  Objective   Blood pressure (!) 116/57, pulse (!) 46, temperature 99 F (37.2 C), resp. rate (!) 24, height 5\' 8"  (1.727 m), weight 90.7 kg, SpO2 100 %.    Vent Mode: PRVC FiO2 (%):  [60 %-100 %] 60 % Set Rate:  [24 bmp] 24 bmp Vt Set:  [440 mL] 440 mL PEEP:  [12 cmH20] 12 cmH20 Plateau Pressure:  [19 cmH20-22 cmH20] 21 cmH20   Intake/Output Summary (Last 24 hours) at 10/05/2019 [id number] Last data filed at 10/05/2019 0300 Gross per 24 hour  Intake 970.8 ml  Output 675 ml  Net 295.8 ml   Filed Weights   10/04/19 1809  Weight: 90.7 kg     Examination: General: elderly female on vent in AND HEENT: MM pink/moist, ETT Neuro: sedate, arouses to voice, pupils =/reactive  CV: s1s2 irr irr, AFlutter slow 60's on monitor, no m/r/g PULM:  Non-labored, lungs bilaterally diminished GI: soft, bsx4 active  Extremities: warm/dry, no edema  Skin: no rashes or lesions  CXR 1/27 >> images personally reviewed, bilateral opacities, ETT in good position   Resolved Hospital Problem list      Assessment & Plan:   Acute Hypoxemic Respiratory Failure secondary to COVID PNA ARDS -low Vt ventilation 4-8cc/kg -goal plateau pressure <30, driving pressure [id number] cm H2O -target PaO2 55-65, titrate PEEP/FiO2 per ARDS protocol  -if P/F ratio <150, consider prone therapy for 16 hours per day -goal CVP <4, diuresis as necessary -VAP prevention measures  -follow intermittent CXR  -decadron, remdesivir   Sedation Needs in setting of Mechanical Ventilation  -PAD protocol with propofol, fentanyl -RASS Goal -1 to -2 -delirium prevention measures  AKI  -Trend BMP / urinary output -Replace electrolytes as indicated -Avoid nephrotoxic agents, ensure adequate renal perfusion  Thrombocytopenia  -follow trend  -no indication for transfusion at this time   Biventricular Failure  Unknown baseline EF.  None documented in Care Everywhere available to see. Follows with Dr. Bettina Gavia   -Cardiology consulted 1/27 -did not tolerate neo due further bradycardia,  did not tolerate levo due to VT  Hx HTN, HLD, CVA Hx AFlutter  -continue eliquis  -hold home agents > norvasc, lipitor, coreg, lasix, olmesartan, hydralazine   Hx OSA  -will need CPAP QHS post extubation   DM II with Peripheral Neuropathy  Hgb A1c 7  -hold home amaryl -SSI + levemir -continue home gabapentin   Hypothyroidism  -synthroid  -assess TSH  GERD -continue PPI, on omeprazole prior to admit   Anxiety / Depression  -continue wellbutrin -hold home ambien   Hx MRSA Foot  Wound Infection  Positive on 05/13/2019 -healed, no acute interventions   Best practice:  Diet: NPO, TF ordered Pain/Anxiety/Delirium protocol (if indicated): per protocol VAP protocol (if indicated): per protocol DVT prophylaxis: Eliquis  GI prophylaxis: PPI  Glucose control: SSI Mobility: BR  Code Status: full Family Communication: Neighbor [name] Kenney updated 1/27.  Patient has a brother in [place].   Disposition: ICU  Labs   CBC: Recent Labs  Lab 10/04/19 1806 10/04/19 2107 10/05/19 0458  WBC  --  4.5 6.1  NEUTROABS  --  4.1  --   HGB 12.9 12.4 12.0  HCT 38.0 38.6 38.1  MCV  --  101.8* 101.3*  PLT  --  99* 105*    Basic Metabolic Panel: Recent Labs  Lab 10/04/19 1806 10/04/19 2107 10/05/19 0458  NA 136 137 143  K 4.2 4.2 3.9  CL  --  102 110  CO2  --  20* 23  GLUCOSE  --  396* 115*  BUN  --  67* 72*  CREATININE  --  1.88* 1.89*  CALCIUM  --  7.5* 8.2*  MG  --  2.0 2.1  PHOS  --  5.2* 4.1   GFR: Estimated Creatinine Clearance: 32.2 mL/min (A) (by C-G formula based on SCr of 1.89 mg/dL (H)). Recent Labs  Lab 10/04/19 2107 10/04/19 2320 10/05/19 0458  PROCALCITON 12.78  --   --   WBC 4.5  --  6.1  LATICACIDVEN 1.5 3.0*  --     Liver Function Tests: Recent Labs  Lab 10/04/19 2107  AST 58*  ALT 100*  ALKPHOS 67  BILITOT 2.3*  PROT 5.3*  ALBUMIN 2.7*   No results for input(s): LIPASE, AMYLASE in the last 168 hours. No results for input(s): AMMONIA in the last 168 hours.  ABG    Component Value Date/Time   PHART 7.275 (L) 10/04/2019 1806   PCO2ART 40.0 10/04/2019 1806   PO2ART 162.0 (H) 10/04/2019 1806   HCO3 18.6 (L) 10/04/2019 1806   TCO2 20 (L) 10/04/2019 1806   ACIDBASEDEF 8.0 (H) 10/04/2019 1806   O2SAT 99.0 10/04/2019 1806     Coagulation Profile: Recent Labs  Lab 10/04/19 2320  INR 1.4*    Cardiac Enzymes: No results for input(s): CKTOTAL, CKMB, CKMBINDEX, TROPONINI in the last 168 hours.  HbA1C: No results found for:  HGBA1C  CBG: Recent Labs  Lab 10/05/19 0056 10/05/19 0157 10/05/19 0256 10/05/19 0417 10/05/19 0746  GLUCAP 139* 138* 131* 127* 78    Critical care time: 33 minutes    Noe Gens, MSN, NP-C [facility] 10/05/2019, 9:29 AM   Please see [email or web address] for pager details.

## 2019-10-05 NOTE — Progress Notes (Signed)
ANTICOAGULATION CONSULT NOTE - Initial Consult  Pharmacy Consult for Heparin IV Indication: atrial fibrillation (holding apixaban)  Allergies  Allergen Reactions  . Gabapentin Other (See Comments)    Dizziness and drowsiness  . Lisinopril Cough    Patient Measurements: Height: 5' 8" (172.7 cm) Weight: 199 lb 15.3 oz (90.7 kg)(Stated weight per [facility]) IBW/kg (Calculated) : 63.9 Heparin Dosing Weight: 83 kg  Vital Signs: Temp: 99 F (37.2 C) (01/27 0800) BP: 96/52 (01/27 1208) Pulse Rate: 56 (01/27 1208)  Labs: Recent Labs    10/04/19 1806 10/04/19 1806 10/04/19 2107 10/04/19 2320 10/05/19 0458  HGB 12.9   < > 12.4  --  12.0  HCT 38.0  --  38.6  --  38.1  PLT  --   --  99*  --  105*  APTT  --   --   --  33  --   LABPROT  --   --   --  16.8*  --   INR  --   --   --  1.4*  --   CREATININE  --   --  1.88*  --  1.89*   < > = values in this interval not displayed.    Estimated Creatinine Clearance: 32.2 mL/min (A) (by C-G formula based on SCr of 1.89 mg/dL (H)).   Medical History: Past Medical History:  Diagnosis Date  . Acquired hammer toes of both feet 01/22/2017  . Arthritis 11/29/2013  . Atrial flutter ([place]) 01/24/2014   Overview:  CHADS2 vasc score= 5  . B-complex deficiency 11/29/2013  . Cerebral artery occlusion with cerebral infarction ([place]) 12/04/2013   Overview:  STORY: MRI +ve Left frontoparietal infarction (LMCA). +AFib.`E1o3L`IMPRESSION: BP is under controlled. Continue home health. +AFib, con't Eliquis 15m bid. [facility]  . Diabetic polyneuropathy associated with type 2 diabetes mellitus ([place] 01/22/2017  . Essential hypertension 11/13/2015  . Hyperlipemia, retention 11/13/2015  . OSA (obstructive sleep apnea) 01/24/2014   Overview:  IMPRESSION: Will obtain old record for review and ask DME to d/l compliance and fax the result to me. F/u in 6 wks.  . Pre-ulcerative corn or callous 01/22/2017  . Stroke  ([place] 01/24/2014  . Thyroid disease 11/29/2013  . Type II diabetes mellitus with ophthalmic manifestations ([place] 11/29/2013    Medications:  Scheduled:  . chlorhexidine gluconate (MEDLINE KIT)  15 mL Mouth Rinse BID  . Chlorhexidine Gluconate Cloth  6 each Topical Daily  . dexamethasone  6 mg Per Tube Daily  . [START ON 10/06/2019] famotidine  20 mg Per Tube Daily  . feeding supplement (PRO-STAT SUGAR FREE 64)  30 mL Per Tube BID  . insulin aspart  2-6 Units Subcutaneous Q4H  . insulin aspart  5 Units Subcutaneous Q4H  . insulin detemir  15 Units Subcutaneous Q12H  . levothyroxine  175 mcg Per Tube Daily  . mouth rinse  15 mL Mouth Rinse 10 times per day   Infusions:  . sodium chloride 250 mL (10/05/19 1208)  . dextrose    . DOPamine 2.5 mcg/kg/min (10/05/19 1437)  . feeding supplement (VITAL AF 1.2 CAL) 1,000 mL (10/05/19 1444)  . fentaNYL infusion INTRAVENOUS 125 mcg/hr (10/05/19 1230)  . norepinephrine (LEVOPHED) Adult infusion 2 mcg/min (10/05/19 1132)  . propofol (DIPRIVAN) infusion 5 mcg/kg/min (10/05/19 1144)  . remdesivir 100 mg in NS 100 mL 100 mg (10/05/19 1051)    Assessment: 89yoF admitted on 1/26 with COVID-19 pneumonia.  Pharmacy  is consulted to transition apixaban to heparin IV for Afib.  Cardiology consulted for biventricular failure with strong suspicion for coronavirus myocarditis and recommends change to heparin as she may have developed a clot on Eliquis versus developing a clot in her left ventricle.  Repeat a limited contrast echocardiogram to exclude LV thrombus. Last apixaban 5 mg BID dose given on 1/27 at 0944  SCr 1.89 CBC: Hgb 12, stable.  Plt low/improved at 105 (Baseline Plt 99 on admit) No bleeding or complications reported.  Goal of Therapy:  Heparin level 0.3-0.7 units/ml aPTT 66-102 seconds Monitor platelets by anticoagulation protocol: Yes   Plan:  Baseline coags No heparin bolus d/t recent apixaban and AKI Start heparin IV infusion at 1300  units/hr at 22:00 APTT 8 hours after starting Daily aPTT, heparin level, and CBC F/u updated weight   Gretta Arab PharmD, BCPS Clinical pharmacist phone 7am- 5pm: [phone] 10/05/2019 3:51 PM

## 2019-10-05 NOTE — Progress Notes (Signed)
SENT BELONGINGS TO SECURITY   1. WALLET WITH CHECKBOOK 2. $75 DOLLARS CASH  Items given to CN, Jarrett Soho to be delivered to safe/security at this time.

## 2019-10-05 NOTE — Progress Notes (Signed)
Informed about bradycardia  Turn down sedation  Atropine for heart rate sustaining below 40 Versed 1 mg every 2 as needed for sedation

## 2019-10-05 NOTE — Plan of Care (Signed)
Pt remained on the ventilator today and her RASS was -1/-2. Given PRN fentanyl pushes for pain throughout the shift. Central line placed in the right IJ this afternoon by Dr. Ruthann Cancer. Pre-procedure timeout was completed with Dr. Ruthann Cancer, Noe Gens (NP), and Geroge Baseman (RN). Pt given one time dose of 2 mg of versed to help keep her comfortable throughout the procedure.  Patient's HCPOA was updated this morning on events overnight. All questions were answered.   Problem: Education: Goal: Knowledge of risk factors and measures for prevention of condition will improve Outcome: Progressing   Problem: Coping: Goal: Psychosocial and spiritual needs will be supported Outcome: Progressing   Problem: Respiratory: Goal: Will maintain a patent airway Outcome: Progressing Goal: Complications related to the disease process, condition or treatment will be avoided or minimized Outcome: Progressing   Problem: Education: Goal: Knowledge of General Education information will improve Description: Including pain rating scale, medication(s)/side effects and non-pharmacologic comfort measures Outcome: Progressing   Problem: Health Behavior/Discharge Planning: Goal: Ability to manage health-related needs will improve Outcome: Progressing   Problem: Clinical Measurements: Goal: Ability to maintain clinical measurements within normal limits will improve Outcome: Progressing Goal: Will remain free from infection Outcome: Progressing Goal: Diagnostic test results will improve Outcome: Progressing Goal: Respiratory complications will improve Outcome: Progressing Goal: Cardiovascular complication will be avoided Outcome: Progressing   Problem: Activity: Goal: Risk for activity intolerance will decrease Outcome: Progressing   Problem: Nutrition: Goal: Adequate nutrition will be maintained Outcome: Progressing   Problem: Coping: Goal: Level of anxiety will decrease Outcome: Progressing    Problem: Elimination: Goal: Will not experience complications related to bowel motility Outcome: Progressing Goal: Will not experience complications related to urinary retention Outcome: Progressing   Problem: Pain Managment: Goal: General experience of comfort will improve Outcome: Progressing   Problem: Safety: Goal: Ability to remain free from injury will improve Outcome: Progressing   Problem: Skin Integrity: Goal: Risk for impaired skin integrity will decrease Outcome: Progressing

## 2019-10-05 NOTE — Progress Notes (Signed)
  Echocardiogram 2D Echocardiogram has been performed.  Jennette Dubin 10/05/2019, 10:06 AM

## 2019-10-05 NOTE — Progress Notes (Signed)
Patient did have a run of V. tach Multiple ectopies during the day  Now off dopamine  Vasopressin started

## 2019-10-05 NOTE — Progress Notes (Signed)
Assisted tele visit to patient with provider Cardiology.  Maryelizabeth Rowan, RN

## 2019-10-05 NOTE — Procedures (Signed)
Cortrak  Person Inserting Tube:  Debra Mcconnell, RD Tube Type:  Cortrak - 43 inches Tube Location:  Right nare Initial Placement:  Postpyloric Secured by: Bridle Technique Used to Measure Tube Placement:  Documented cm marking at nare/ corner of mouth Cortrak Secured At:  80 cm    Cortrak Tube Team Note:  Consult received to place a Cortrak feeding tube.   No x-ray is required. RN may begin using tube.   If the tube becomes dislodged please keep the tube and contact the Cortrak team at [email or web address] (password [email or web address]) for replacement.  If after hours and replacement cannot be delayed, place a NG tube and confirm placement with an abdominal x-ray.    [place], [place], [place] Pager [phone] After Hours Pager

## 2019-10-05 NOTE — Progress Notes (Addendum)
[place] Progress Note Patient Name: EVALENE HOWCROFT DOB: 10-Oct-1945 MRN: [id number]   Date of Service  10/05/2019  HPI/Events of Note  BP 150/70, MAP 90  eICU Interventions  Dopamine weaned to 1.5 mcg/kg/min. Plan is to discontinue Dopamine if blood pressure and heart rate remain high .        Kerry Kass Zamiya Dillard 10/05/2019, 2:52 AM

## 2019-10-05 NOTE — Plan of Care (Signed)
Patient requiring pressors now to maintain bp; also started insulin gtt for high sugars

## 2019-10-05 NOTE — Progress Notes (Signed)
Spoke with MD again concerning HR and rhythm.  Looks to be sustaining aflutter in low 40's.  Vasopressin has seemed to help some with ectomy and hypotension.  Will stop propofol, standing order for atropine given if HR sustains under 40's.

## 2019-10-05 NOTE — Procedures (Signed)
Central Venous Catheter Insertion Procedure Note Debra Mcconnell [id number] December 27, 1945  Procedure: Insertion of Central Venous Catheter Indications: Drug and/or fluid administration  Procedure Details Consent: Risks of procedure as well as the alternatives and risks of each were explained to the (patient/caregiver).  Consent for procedure obtained. Time Out: Verified patient identification, verified procedure, site/side was marked, verified correct patient position, special equipment/implants available, medications/allergies/relevent history reviewed, required imaging and test results available.  Performed  Maximum sterile technique was used including antiseptics, cap, gloves, gown, hand hygiene, mask and sheet. Skin prep: Chlorhexidine; local anesthetic administered A antimicrobial bonded/coated triple lumen catheter was placed in the right internal jugular vein using the Seldinger technique.  Evaluation Blood flow good Complications: No apparent complications Patient did tolerate procedure well. Chest X-ray ordered to verify placement.  CXR: pending.   Line was done under u/s guidance. Easily compressible vein noted with neighboring pulsatile artery. Upon stick dark red non pulsatile blood was noted. Wire easily advanced. Wire was verified to be in easily compressible/non pulsatile vessel in both cross sectional and longitudinal views under ultrasound guidance. Skin was easily dilated and catheter advanced without issue. Wire was withdrawn from vessel. No air was aspirated thru entirety of the procedure. Pt tolerated well. No complications were appreciated.  Audria Nine 10/05/2019, 5:50 PM

## 2019-10-05 NOTE — Progress Notes (Signed)
Inpatient Diabetes Program Recommendations  AACE/ADA: New Consensus Statement on Inpatient Glycemic Control (2015)  Target Ranges:  Prepandial:   less than 140 mg/dL      Peak postprandial:   less than 180 mg/dL (1-2 hours)      Critically ill patients:  140 - 180 mg/dL   Lab Results  Component Value Date   GLUCAP 78 10/05/2019    Review of Glycemic Control Results for Debra Mcconnell, Debra Mcconnell (MRN [id number]) as of 10/05/2019 11:03  Ref. Range 10/05/2019 00:56 10/05/2019 01:57 10/05/2019 02:56 10/05/2019 04:17 10/05/2019 07:46  Glucose-Capillary Latest Ref Range: 70 - 99 mg/dL 139 (H) 138 (H) 131 (H) 127 (H) 78   Diabetes history: DM 2 Outpatient Diabetes medications:  Exenatide 2 mg weekly, Amaryl 1-2 mg weekly Current orders for Inpatient glycemic control:  Vital 40 cc/hr Levemir 15 units q 12 hours Novolog 5 units q 4 hours Inpatient Diabetes Program Recommendations:   Note patient transitioned off insulin drip this AM.  Will follow.   Thanks  Adah Perl, RN, BC-ADM Inpatient Diabetes Coordinator Pager [phone] (8a-5p)

## 2019-10-05 NOTE — Progress Notes (Addendum)
[place] Progress Note Patient Name: SHAYLAH LINDAU DOB: 07-Jan-1946 MRN: [id number]   Date of Service  10/05/2019  HPI/Events of Note  MAP still < 65 mmHg, Lactic acid 3.0, BNP 2040, ? Cardiogenic shock  eICU Interventions  Dopamine 1.5 mcg/kg/min, slowly wean of Phenylephrine, ECHO in a.m.        Kerry Kass Serina Nichter 10/05/2019, 12:40 AM

## 2019-10-05 NOTE — Consult Note (Signed)
Cardiology Consultation:   Due to the COVID-19 pandemic, this visit was completed with telemedicine (audio/video) technology to reduce patient and provider exposure as well as to preserve personal protective equipment.   Patient ID: Debra Mcconnell MRN: [id number]; DOB: 1945-12-13  Admit date: 10/04/2019 Date of Consult: 10/05/2019  Primary Care Provider: Greig Right, MD Primary Cardiologist: Shirlee More, MD  Primary Electrophysiologist:  None    Patient Profile:   Debra Mcconnell is a 74 y.o. female with a history of chronic atypical atrial flutter on Eliquis, CVA in 2015, obstructive sleep apnea, hypertension, hyperlipidemia, type 2 diabetes mellitus,  who is being seen today for the evaluation of CHF and bradycardia at the request of Noe Gens, NP (Critical Care).  History of Present Illness:   Debra Mcconnell is a 74 year old female with the above history who is followed by Dr. Bettina Gavia for her cardiac care. She last saw Dr. Bettina Gavia in 10/2018 at which time she was still in rate controlled atrial flutter but was doing well from a cardiac standpoint. There was some concern for bradycardia so patient was advised to monitor BP and heart rate at home and notify Dr. Bettina Gavia if rates persistently less than 50 bpm. However, Coreg 64m twice daily was continued at that time.    [facility] with altered mental status and was found to be hypoxic and COVID positive. Chest x-ray there demonstrated mucus plugging on the right. Pulmonology evaluated her there and recommended mucolytic's and chest PT. Bronchoscopy was deferred due to high PEEP/FiO2 needs.  She had worsening respiratory failure requiring intubation [facility]. Patient is  currently still on ventilator so history obtained via chart review.  [facility] W[facility] 4.5, Hgb 12.4, Plts 99. Na 137, K 4.2, Glucose 396, BUN 67, Cr 1.88 (1.4 at  [place], Ca 7.5, Mg 2.0. Albumin 2.7, AST 58, ALT 100, Alk Phos 67, Total Bili. Lactic Acid 1.5 (3.8 at [place]. BNP 2,040. D-dimer 2.37. [facility] EKG showed atrial flutter with 4:1 AV conduction, rate 67 bpm with possible new LBBB and left axis deviation.   Past Medical History:  Diagnosis Date  . Acquired hammer toes of both feet 01/22/2017  . Arthritis 11/29/2013  . Atrial flutter ([place] 01/24/2014   Overview:  CHADS2 vasc score= 5  . B-complex deficiency 11/29/2013  . Cerebral artery occlusion with cerebral infarction ([place] 12/04/2013   Overview:  STORY: MRI +ve Left frontoparietal infarction (LMCA). +AFib.`E1o3L`IMPRESSION: BP is under controlled. Continue home health. +AFib, con't Eliquis 58mbid. [facility]  . Diabetic polyneuropathy associated with type 2 diabetes mellitus ([facility] [place]/[place]2018  . Essential hypertension 11/13/2015  . Hyperlipemia, retention 11/13/2015  . OSA (obstructive sleep apnea) 01/24/2014   Overview:  IMPRESSION: Will obtain old record for review and ask DME to d/l compliance and fax the result to me. F/u in 6 wks.  . Pre-ulcerative corn or callous 01/22/2017  . Stroke ([place]/2015  . Thyroid disease 11/29/2013  . Type II diabetes mellitus with ophthalmic manifestations ([place]3/24/2015    Past Surgical History:  Procedure Laterality Date  . CHOLECYSTECTOMY    . GASTROCYSTOPLASTY    . KNEE SURGERY       Home Medications:  Prior to Admission medications   Medication Sig Start Date End Date Taking? Authorizing Provider  amLODipine (NORVASC) 10 MG tablet Take  10 mg by mouth daily.    [provider]  apixaban (ELIQUIS) 5 MG TABS tablet Take 1 tablet (5 mg total) by mouth 2 (two) times daily. 10/12/18   Richardo Priest, MD  atorvastatin (LIPITOR) 40 MG tablet Take 40 mg by mouth daily.    [provider]  buPROPion (WELLBUTRIN XL) 300 MG 24 hr tablet Take 300 mg by mouth daily.     [provider]  carvedilol (COREG) 25 MG tablet Take 25 mg by mouth 2 (two) times daily.    [provider]  Cholecalciferol (VITAMIN D) 2000 units CAPS Take 4,000 Units by mouth 2 (two) times daily.    [provider]  ciprofloxacin (CIPRO) 500 MG tablet Take 1 tablet (500 mg total) by mouth 2 (two) times daily. 05/19/19   Landis Martins, DPM  clindamycin (CLEOCIN) 300 MG capsule Take 1 capsule (300 mg total) by mouth 3 (three) times daily. 05/17/19   Landis Martins, DPM  Cyanocobalamin (VITAMIN B 12 PO) Take 1,000 mcg by mouth daily.    [provider]  Exenatide ER 2 MG SRER Inject 2 mg into the skin once a week.     [provider]  ferrous sulfate 325 (65 FE) MG EC tablet Take 325 mg by mouth 2 (two) times daily with a meal.    [provider]  furosemide (LASIX) 20 MG tablet Take 20 mg by mouth daily.    [provider]  gabapentin (NEURONTIN) 300 MG capsule Take 300 mg by mouth 3 (three) times daily. 03/17/17   [provider]  glimepiride (AMARYL) 1 MG tablet Take 1-2 mg by mouth See admin instructions. Take 2 mg by mouth in the morning and 1 mg in the evening 12/22/16   [provider]  hydrALAZINE (APRESOLINE) 50 MG tablet Take 50 mg by mouth 3 (three) times daily.    [provider]  levothyroxine (SYNTHROID, LEVOTHROID) 175 MCG tablet Take 175 mcg by mouth daily.    [provider]  losartan (COZAAR) 100 MG tablet Take 100 mg by mouth daily. 04/10/17   [provider]  olmesartan (BENICAR) 40 MG tablet Take 40 mg by mouth daily.  03/03/19   [provider]  omeprazole (PRILOSEC) 20 MG capsule Take 20 mg by mouth daily.  04/26/19   [provider]  potassium chloride (KLOR-CON) 10 MEQ tablet Take 15 mEq by mouth daily. 09/26/19   [provider]  zolpidem (AMBIEN) 5 MG tablet Take 5 mg by mouth at bedtime as needed for sleep.    [provider]     Inpatient Medications: Scheduled Meds: . apixaban  5 mg Oral BID  . chlorhexidine gluconate (MEDLINE KIT)  15 mL Mouth Rinse BID  . Chlorhexidine Gluconate Cloth  6 each Topical Daily  . dexamethasone  6 mg Per Tube Daily  . [START ON 10/06/2019] famotidine  20 mg Per Tube Daily  . feeding supplement (PRO-STAT SUGAR FREE 64)  30 mL Per Tube BID  . insulin aspart  2-6 Units Subcutaneous Q4H  . insulin aspart  5 Units Subcutaneous Q4H  . insulin detemir  15 Units Subcutaneous Q12H  . levothyroxine  175 mcg Per Tube Daily  . mouth rinse  15 mL Mouth Rinse 10 times per day   Continuous Infusions: . sodium chloride 250 mL (10/05/19 1208)  . dextrose    . DOPamine    . feeding supplement (VITAL AF 1.2 CAL)    .  fentaNYL infusion INTRAVENOUS 125 mcg/hr (10/05/19 1230)  . norepinephrine (LEVOPHED) Adult infusion 2 mcg/min (10/05/19 1132)  . propofol (DIPRIVAN) infusion 5 mcg/kg/min (10/05/19 1144)  . remdesivir 100 mg in NS 100 mL 100 mg (10/05/19 1051)   PRN Meds: dextrose, docusate, fentaNYL, midazolam, midazolam  Allergies:    Allergies  Allergen Reactions  . Gabapentin Other (See Comments)    Dizziness and drowsiness  . Lisinopril Cough    Social History:   Social History   Socioeconomic History  . Marital status: Divorced    Spouse name: Not on file  . Number of children: Not on file  . Years of education: Not on file  . Highest education level: Not on file  Occupational History  . Not on file  Tobacco Use  . Smoking status: Never Smoker  . Smokeless tobacco: Never Used  Substance and Sexual Activity  . Alcohol use: No  . Drug use: No  . Sexual activity: Not on file  Other Topics Concern  . Not on file  Social History Narrative  . Not on file   Social Determinants of Health   Financial Resource Strain:   . Difficulty of Paying Living Expenses: Not on file  Food Insecurity:   . Worried About Charity fundraiser in the Last Year: Not on file  . Ran Out  of Food in the Last Year: Not on file  Transportation Needs:   . Lack of Transportation (Medical): Not on file  . Lack of Transportation (Non-Medical): Not on file  Physical Activity:   . Days of Exercise per Week: Not on file  . Minutes of Exercise per Session: Not on file  Stress:   . Feeling of Stress : Not on file  Social Connections:   . Frequency of Communication with Friends and Family: Not on file  . Frequency of Social Gatherings with Friends and Family: Not on file  . Attends Religious Services: Not on file  . Active Member of Clubs or Organizations: Not on file  . Attends Archivist Meetings: Not on file  . Marital Status: Not on file  Intimate Partner Violence:   . Fear of Current or Ex-Partner: Not on file  . Emotionally Abused: Not on file  . Physically Abused: Not on file  . Sexually Abused: Not on file    Family History:   Family History  Problem Relation Age of Onset  . Heart disease Mother   . Heart disease Father   . Cancer Father      ROS:  Please see the history of present illness.  Review of Systems  Unable to perform ROS: Intubated   Physical Exam/Data:   Vitals:   10/05/19 0700 10/05/19 0800 10/05/19 0830 10/05/19 1208  BP: 112/63 112/73 (!) 116/57 (!) 96/52  Pulse: (!) 47 (!) 46  (!) 56  Resp: (!) 24 (!) 24 (!) 24   Temp: 98.8 F (37.1 C) 99 F (37.2 C)    TempSrc:      SpO2: 100% 100% 100% 100%  Weight:      Height:        Intake/Output Summary (Last 24 hours) at 10/05/2019 1413 Last data filed at 10/05/2019 1000 Gross per 24 hour  Intake 1080.8 ml  Output 675 ml  Net 405.8 ml   Last 3 Weights 10/04/2019 10/12/2018 04/15/2017  Weight (lbs) 199 lb 15.3 oz 254 lb 8 oz 252 lb  Weight (kg) 90.7 kg 115.44 kg 114.306 kg  Body mass index is 30.4 kg/m.   Vital Signs: Reviewed. MAP 90-100 off of pressors.  General: Intubated and sedated Respiratory: Intubated. Cardiovascular: Normal rate with irregular rhythm and ectopy per  telemetry. Neurology: Sedated. Arousable on vent. Spontaneously moving upper extremities.  Psych: Sedated.   EKG:  The EKG was personally reviewed and demonstrates:  Atrial flutter with 4:1 AV conduction, rate 67 bpm with possible new LBBB and left axis deviation.  Telemetry:  Telemetry was personally reviewed and demonstrates:  Atrial flutter with frequent PVCs. Rates in the 50's to 60's.   Relevant CV Studies:  Echocardiogram 10/05/2019: Impressions:  1. Left ventricular ejection fraction, by visual estimation, is 25 to 30%. The left ventricle has severely decreased function. There is no left ventricular hypertrophy.  2. Left ventricular diastolic parameters are indeterminate.  3. Moderately dilated left ventricular internal cavity size.  4. The left ventricle demonstrates global hypokinesis.  5. Global right ventricle has severely reduced systolic function.The right ventricular size is moderately enlarged. Right vetricular wall thickness was not assessed.  6. Left atrial size was severely dilated.  7. Right atrial size was moderately dilated.  8. Moderate pleural effusion in both left and right lateral regions.  9. Trivial pericardial effusion is present. 10. The mitral valve is normal in structure. Mild mitral valve regurgitation. 11. The tricuspid valve is normal in structure. 12. The tricuspid valve is normal in structure. Tricuspid valve regurgitation is trivial. 13. The aortic valve is tricuspid. Aortic valve regurgitation is not visualized. 14. The pulmonic valve was grossly normal. Pulmonic valve regurgitation is trivial. 15. TR signal is inadequate for assessing pulmonary artery systolic pressure. 16. The inferior vena cava is dilated in size with <50% respiratory variability, suggesting right atrial pressure of 15 mmHg. 17. Severely reduced LVEF, with evidence of biventricular failure and elevated right atrial pressure. 18. There is no ECG tracing on this study, but patient  appears to be in significant bradycardia throughout study. Based on imaging, may be flutter. Recommend clinical correlation.  Laboratory Data:  Chemistry Recent Labs  Lab 10/04/19 1806 10/04/19 2107 10/05/19 0458  NA 136 137 143  K 4.2 4.2 3.9  CL  --  102 110  CO2  --  20* 23  GLUCOSE  --  396* 115*  BUN  --  67* 72*  CREATININE  --  1.88* 1.89*  CALCIUM  --  7.5* 8.2*  GFRNONAA  --  26* 26*  GFRAA  --  31* 30*  ANIONGAP  --  15 10    Recent Labs  Lab 10/04/19 2107  PROT 5.3*  ALBUMIN 2.7*  AST 58*  ALT 100*  ALKPHOS 67  BILITOT 2.3*   Hematology Recent Labs  Lab 10/04/19 1806 10/04/19 2107 10/05/19 0458  WBC  --  4.5 6.1  RBC  --  3.79* 3.76*  HGB 12.9 12.4 12.0  HCT 38.0 38.6 38.1  MCV  --  101.8* 101.3*  MCH  --  32.7 31.9  MCHC  --  32.1 31.5  RDW  --  15.8* 15.6*  PLT  --  99* 105*   Cardiac EnzymesNo results for input(s): TROPONINI in the last 168 hours. No results for input(s): TROPIPOC in the last 168 hours.  BNP Recent Labs  Lab 10/04/19 2107  BNP 2,040.2*    DDimer  Recent Labs  Lab 10/04/19 2320  DDIMER 2.37*    Radiology/Studies:  DG Chest Port 1 View  Result Date: 10/05/2019 CLINICAL DATA:  ETT, central line  placement EXAM: PORTABLE CHEST 1 VIEW COMPARISON:  Radiograph 10/04/2019 FINDINGS: *Endotracheal tube terminates 4 cm from the carina in the mid trachea. *Transesophageal tube tip and side port distal to the GE junction, terminating below the level of imaging. *Telemetry leads and external catheter tubing overlie the chest. Increasingly confluent opacity is seen in the right infrahilar lung. There is worsening indistinctness of the left hemidiaphragm with increasing retrocardiac opacity as well possibly reflecting accumulating pleural fluid and/or airspace disease. No visible right effusion though the costophrenic sulcus is partially collimated. No pneumothorax. No acute osseous or soft tissue abnormality. Degenerative changes are  present in the imaged spine and shoulders. IMPRESSION: 1. Increasing confluent opacity in the right infrahilar lung. 2. Worsening indistinctness of the left hemidiaphragm with increasing retrocardiac opacity possibly reflecting accumulating pleural fluid, worsening airspace disease or atelectasis. Electronically Signed   By: Lovena Le M.D.   On: 10/05/2019 06:16   DG Chest Port 1 View  Result Date: 10/04/2019 CLINICAL DATA:  Endotracheal tube position. Respiratory failure. EXAM: PORTABLE CHEST 1 VIEW COMPARISON:  Earlier chest x-ray, same date. FINDINGS: The endotracheal tube is 4.5 cm above the carina. The NG tube is coursing down the esophagus and into the stomach. Much improved lung aeration with re-expansion of the left upper lobe. The right lung is also much better aerated. Persistent left effusion. IMPRESSION: 1. Endotracheal tube and NG tubes in good position. 2. Much improved lung aeration since earlier film. Electronically Signed   By: Marijo Sanes M.D.   On: 10/04/2019 17:56   ECHOCARDIOGRAM COMPLETE  Result Date: 10/05/2019   ECHOCARDIOGRAM REPORT   Patient Name:   JONAE RENSHAW          Date of Exam: 10/05/2019 Medical Rec #:  [id number]              Height:       68.0 in Accession #:    [id number]             Weight:       200.0 lb Date of Birth:  1946/09/08             BSA:          2.04 m Patient Age:    74 years               BP:           116/57 mmHg Patient Gender: F                      HR:           46 bpm. Exam Location:  [facility] Procedure: 2D Echo Indications:    Cardiogenic Shock  History:        Patient has no prior history of Echocardiogram examinations.                 Covid-19 Positive; Risk Factors:Diabetes, Dyslipidemia and                 Hypertension.  Sonographer:    Mikki Santee RDCS (AE) Referring Phys: [id number] [place]  1. Left ventricular ejection fraction, by visual estimation, is 25 to 30%. The left ventricle has severely  decreased function. There is no left ventricular hypertrophy.  2. Left ventricular diastolic parameters are indeterminate.  3. Moderately dilated left ventricular internal cavity size.  4. The left ventricle demonstrates global hypokinesis.  5. Global right ventricle has severely reduced systolic  function.The right ventricular size is moderately enlarged. Right vetricular wall thickness was not assessed.  6. Left atrial size was severely dilated.  7. Right atrial size was moderately dilated.  8. Moderate pleural effusion in both left and right lateral regions.  9. Trivial pericardial effusion is present. 10. The mitral valve is normal in structure. Mild mitral valve regurgitation. 11. The tricuspid valve is normal in structure. 12. The tricuspid valve is normal in structure. Tricuspid valve regurgitation is trivial. 13. The aortic valve is tricuspid. Aortic valve regurgitation is not visualized. 14. The pulmonic valve was grossly normal. Pulmonic valve regurgitation is trivial. 15. TR signal is inadequate for assessing pulmonary artery systolic pressure. 16. The inferior vena cava is dilated in size with <50% respiratory variability, suggesting right atrial pressure of 15 mmHg. 17. Severely reduced LVEF, with evidence of biventricular failure and elevated right atrial pressure. 18. There is no ECG tracing on this study, but patient appears to be in significant bradycardia throughout study. Based on imaging, may be flutter. Recommend clinical correlation. FINDINGS  Left Ventricle: Left ventricular ejection fraction, by visual estimation, is 25 to 30%. The left ventricle has severely decreased function. The left ventricle demonstrates global hypokinesis. The left ventricular internal cavity size was moderately dilated left ventricle. There is no left ventricular hypertrophy. Left ventricular diastolic parameters are indeterminate. Right Ventricle: The right ventricular size is moderately enlarged. Right vetricular  wall thickness was not assessed. Global RV systolic function is has severely reduced systolic function. Left Atrium: Left atrial size was severely dilated. Right Atrium: Right atrial size was moderately dilated Pericardium: Trivial pericardial effusion is present. There is a moderate pleural effusion in both left and right lateral regions. Mitral Valve: The mitral valve is normal in structure. Mild mitral valve regurgitation. Tricuspid Valve: The tricuspid valve is normal in structure. Tricuspid valve regurgitation is trivial. Aortic Valve: The aortic valve is tricuspid. Aortic valve regurgitation is not visualized. There is mild calcification of the aortic valve. Pulmonic Valve: The pulmonic valve was grossly normal. Pulmonic valve regurgitation is trivial. Pulmonic regurgitation is trivial. Aorta: The aortic root and ascending aorta are structurally normal, with no evidence of dilitation. Venous: The inferior vena cava is dilated in size with less than 50% respiratory variability, suggesting right atrial pressure of 15 mmHg. IAS/Shunts: No atrial level shunt detected by color flow Doppler.  LEFT VENTRICLE PLAX 2D LVIDd:         5.50 cm  Diastology LVIDs:         4.69 cm  LV e' lateral: 7.31 cm/s LV PW:         0.93 cm  LV e' medial:  3.88 cm/s LV IVS:        0.76 cm LVOT diam:     2.20 cm LV SV:         46 ml LV SV Index:   21.58 LVOT Area:     3.80 cm  RIGHT VENTRICLE RV S prime:     8.43 cm/s TAPSE (M-mode): 1.1 cm LEFT ATRIUM              Index       RIGHT ATRIUM           Index LA diam:        3.90 cm  1.91 cm/m  RA Area:     23.80 cm LA Vol (A2C):   154.0 ml 75.36 ml/m RA Volume:   78.80 ml  38.56 ml/m LA Vol (A4C):  153.0 ml 74.87 ml/m LA Biplane Vol: 154.0 ml 75.36 ml/m   AORTA Ao Root diam: 2.80 cm MR Peak grad: 55.7 mmHg MR Vmax:      373.00 cm/s SHUNTS                           Systemic Diam: 2.20 cm  Buford Dresser MD Electronically signed by Buford Dresser MD Signature Date/Time:  10/05/2019/12:59:43 PM    Final     '[email or web address]' @   Assessment and Plan:   New Onset Acute CHF with Biventricular Failure - Patient admitted with acute hypoxemic respiratory failure secondary to COVID pneumonia requiring intubation.  - BNP elevated at 2,040. - Echo showed LVEF of 25-30% with global hypokinesis. RV moderately enlarged with severely reduced systolic function and elevated right atrial pressure. Also noted to have biatrial enlargement, mild MR, trivial TR, trivial PR, moderate bilateral pleural effusion, and trivial pericardial effusion.  - Consider diuresis with IV Lasix 66m twice daily. - She is currently on Dopamine drip. Levophed was tried but she had a lot of ectopy with this. MAP in 90's to 100's. MAP goal is >60. Can discontinue Dopamine. If needed, would start Vasopressin.  - Needs a central line. We can check CO-OX off of this.  - Baseline EF unknown. Possibly myocarditis secondary to COVID. Will check troponin, sed rate, and CRP. - Continue to monitor volume status closely.  Chronic Atrial Flutter - Telemetry shows atrial flutter with frequent PVC. Rates in the 50's to 70's.  - Currently on Eliquis 564mtwice daily. Will switch to IV Heparin.  [facility]. She has been on Eliquis due to atrial flutter.  - Will switch to IV Heparin.   AKI - Creatinine 1.89 today. Baseline unknown. - Possibly pre-renal due to low-output.  - Continue to monitor closely.   Elevated LFTs - AST 58, ALT 100, Alk Phos 67, Total Bili 2.3. - Possible hepatic shock due to biventricular failure.  - Continue to monitor.   Otherwise, per primary team. - Acute respiratory failure secondary to COVID pneumonia s/p intubation - Obstructive sleep apnea - Thrombocytopenia - Type 2 diabetes mellitus - Hypothyroidism - GERD - Anxiety/Depression  For questions or updates, please contact [facility] Please  consult [email or web address] for contact info under     Signed, CaDarreld McleanPA-C  10/05/2019 2:13 PM

## 2019-10-05 NOTE — Progress Notes (Signed)
Dr. Ander Slade was notified that patient had a 10 beat run of V-tach and patient's heart rate jumped up to 140s. MD to order vasopressin to help with pressures.

## 2019-10-06 ENCOUNTER — Inpatient Hospital Stay (HOSPITAL_COMMUNITY): Payer: Medicare Other

## 2019-10-06 DIAGNOSIS — I5040 Unspecified combined systolic (congestive) and diastolic (congestive) heart failure: Secondary | ICD-10-CM

## 2019-10-06 DIAGNOSIS — I5082 Biventricular heart failure: Secondary | ICD-10-CM

## 2019-10-06 LAB — POCT I-STAT EG7
Acid-base deficit: 5 mmol/L — ABNORMAL HIGH (ref 0.0–2.0)
Bicarbonate: 22.1 mmol/L (ref 20.0–28.0)
Calcium, Ion: 1.17 mmol/L (ref 1.15–1.40)
HCT: 36 % (ref 36.0–46.0)
Hemoglobin: 12.2 g/dL (ref 12.0–15.0)
O2 Saturation: 66 %
Patient temperature: 98.2
Potassium: 4 mmol/L (ref 3.5–5.1)
Sodium: 145 mmol/L (ref 135–145)
TCO2: 24 mmol/L (ref 22–32)
pCO2, Ven: 46.8 mmHg (ref 44.0–60.0)
pH, Ven: 7.281 (ref 7.250–7.430)
pO2, Ven: 38 mmHg (ref 32.0–45.0)

## 2019-10-06 LAB — CBC
HCT: 39.1 % (ref 36.0–46.0)
Hemoglobin: 12.5 g/dL (ref 12.0–15.0)
MCH: 32.6 pg (ref 26.0–34.0)
MCHC: 32 g/dL (ref 30.0–36.0)
MCV: 102.1 fL — ABNORMAL HIGH (ref 80.0–100.0)
Platelets: 115 10*3/uL — ABNORMAL LOW (ref 150–400)
RBC: 3.83 MIL/uL — ABNORMAL LOW (ref 3.87–5.11)
RDW: 15.9 % — ABNORMAL HIGH (ref 11.5–15.5)
WBC: 7.5 10*3/uL (ref 4.0–10.5)
nRBC: 0.4 % — ABNORMAL HIGH (ref 0.0–0.2)

## 2019-10-06 LAB — COMPREHENSIVE METABOLIC PANEL
ALT: 75 U/L — ABNORMAL HIGH (ref 0–44)
AST: 42 U/L — ABNORMAL HIGH (ref 15–41)
Albumin: 2.8 g/dL — ABNORMAL LOW (ref 3.5–5.0)
Alkaline Phosphatase: 62 U/L (ref 38–126)
Anion gap: 11 (ref 5–15)
BUN: 81 mg/dL — ABNORMAL HIGH (ref 8–23)
CO2: 20 mmol/L — ABNORMAL LOW (ref 22–32)
Calcium: 8.1 mg/dL — ABNORMAL LOW (ref 8.9–10.3)
Chloride: 110 mmol/L (ref 98–111)
Creatinine, Ser: 1.74 mg/dL — ABNORMAL HIGH (ref 0.44–1.00)
GFR calc Af Amer: 34 mL/min — ABNORMAL LOW (ref >60)
GFR calc non Af Amer: 29 mL/min — ABNORMAL LOW (ref >60)
Glucose, Bld: 130 mg/dL — ABNORMAL HIGH (ref 70–99)
Potassium: 4.2 mmol/L (ref 3.5–5.1)
Sodium: 141 mmol/L (ref 135–145)
Total Bilirubin: 1.2 mg/dL (ref 0.3–1.2)
Total Protein: 5.4 g/dL — ABNORMAL LOW (ref 6.5–8.1)

## 2019-10-06 LAB — URINE CULTURE: Culture: NO GROWTH

## 2019-10-06 LAB — HEPARIN LEVEL (UNFRACTIONATED)
Heparin Unfractionated: >2.2 IU/mL — ABNORMAL HIGH (ref 0.30–0.70)
Heparin Unfractionated: >2.2 IU/mL — ABNORMAL HIGH (ref 0.30–0.70)

## 2019-10-06 LAB — GLUCOSE, CAPILLARY
Glucose-Capillary: 135 mg/dL — ABNORMAL HIGH (ref 70–99)
Glucose-Capillary: 159 mg/dL — ABNORMAL HIGH (ref 70–99)
Glucose-Capillary: 171 mg/dL — ABNORMAL HIGH (ref 70–99)
Glucose-Capillary: 185 mg/dL — ABNORMAL HIGH (ref 70–99)
Glucose-Capillary: 198 mg/dL — ABNORMAL HIGH (ref 70–99)
Glucose-Capillary: 200 mg/dL — ABNORMAL HIGH (ref 70–99)
Glucose-Capillary: 222 mg/dL — ABNORMAL HIGH (ref 70–99)

## 2019-10-06 LAB — ECHOCARDIOGRAM LIMITED
Height: 68 in
Weight: 3686.09 oz

## 2019-10-06 LAB — APTT
aPTT: 83 seconds — ABNORMAL HIGH (ref 24–36)
aPTT: 173 seconds (ref 24–36)

## 2019-10-06 MED ORDER — CLONAZEPAM 0.125 MG PO TBDP
0.1250 mg | ORAL_TABLET | Freq: Two times a day (BID) | ORAL | Status: DC
  Administered 2019-10-06 – 2019-10-08 (×5): 0.125 mg via ORAL
  Filled 2019-10-06 (×5): qty 1

## 2019-10-06 MED ORDER — BUPROPION HCL 100 MG PO TABS
100.0000 mg | ORAL_TABLET | Freq: Three times a day (TID) | ORAL | Status: DC
  Administered 2019-10-06 – 2019-10-19 (×38): 100 mg
  Filled 2019-10-06 (×47): qty 1

## 2019-10-06 MED ORDER — OXYCODONE HCL 5 MG PO TABS
5.0000 mg | ORAL_TABLET | Freq: Three times a day (TID) | ORAL | Status: DC
  Administered 2019-10-06 – 2019-10-08 (×6): 5 mg via ORAL
  Filled 2019-10-06 (×6): qty 1

## 2019-10-06 MED ORDER — MIDAZOLAM HCL 2 MG/2ML IJ SOLN
1.0000 mg | INTRAMUSCULAR | Status: DC | PRN
  Administered 2019-10-07 (×2): 2 mg via INTRAVENOUS
  Filled 2019-10-06 (×2): qty 2

## 2019-10-06 MED ORDER — HEPARIN (PORCINE) 25000 UT/250ML-% IV SOLN
1050.0000 [IU]/h | INTRAVENOUS | Status: DC
  Administered 2019-10-06: 18:00:00 1050 [IU]/h via INTRAVENOUS
  Filled 2019-10-06: qty 250

## 2019-10-06 MED ORDER — FUROSEMIDE 10 MG/ML IJ SOLN
40.0000 mg | Freq: Once | INTRAMUSCULAR | Status: AC
  Administered 2019-10-06: 14:00:00 40 mg via INTRAVENOUS
  Filled 2019-10-06: qty 4

## 2019-10-06 MED ORDER — BUPROPION HCL 100 MG PO TABS: 100.0000 mg | ORAL_TABLET | Freq: Three times a day (TID) | ORAL | Status: DC

## 2019-10-06 MED ORDER — PERFLUTREN LIPID MICROSPHERE
1.0000 mL | INTRAVENOUS | Status: AC | PRN
  Administered 2019-10-06: 15:00:00 2 mL via INTRAVENOUS
  Filled 2019-10-06: qty 10

## 2019-10-06 NOTE — Progress Notes (Signed)
[place] for Heparin IV Indication: atrial fibrillation, r/o LV thrombus, splenic infarct (PTA apixaban)  Allergies  Allergen Reactions  . Gabapentin Other (See Comments)    Dizziness and drowsiness  . Lisinopril Cough    Patient Measurements: Height: 5\' 8"  (172.7 cm) Weight: 230 lb 6.1 oz (104.5 kg) IBW/kg (Calculated) : 63.9 Heparin Dosing Weight: 83 kg  Vital Signs: Temp: 98.1 F (36.7 C) (01/28 0800) Temp Source: Axillary (01/28 0800) BP: 121/69 (01/28 1230) Pulse Rate: 64 (01/28 1230)  Labs: Recent Labs    10/04/19 2107 10/04/19 2107 10/04/19 2320 10/05/19 0458 10/05/19 0458 10/05/19 1513 10/05/19 1622 10/05/19 1700 10/05/19 1844 10/05/19 1844 10/06/19 0400 10/06/19 1329  HGB 12.4   < >  --  12.0   < >  --   --   --  12.9   < > 12.5 12.2  HCT 38.6   < >  --  38.1   < >  --   --   --  38.0  --  39.1 36.0  PLT 99*  --   --  105*  --   --   --   --   --   --  115*  --   APTT  --   --  33  --   --   --   --  34  --   --  83*  --   LABPROT  --   --  16.8*  --   --   --  18.2*  --   --   --   --   --   INR  --   --  1.4*  --   --   --  1.5*  --   --   --   --   --   HEPARINUNFRC  --   --   --   --   --   --   --  >2.20*  --   --  >2.20*  --   CREATININE 1.88*  --   --  1.89*  --   --   --   --   --   --  1.74*  --   TROPONINIHS  --   --   --   --   --  303*  --   --   --   --   --   --    < > = values in this interval not displayed.    Estimated Creatinine Clearance: 37.5 mL/min (A) (by C-G formula based on SCr of 1.74 mg/dL (H)).    Medications:  Infusions:  . sodium chloride 250 mL (10/05/19 1208)  . dextrose    . feeding supplement (VITAL AF 1.2 CAL) 1,000 mL (10/05/19 1444)  . fentaNYL infusion INTRAVENOUS 225 mcg/hr (10/06/19 1232)  . heparin 1,300 Units/hr (10/06/19 1100)  . norepinephrine (LEVOPHED) Adult infusion Stopped (10/05/19 1502)  . remdesivir 100 mg in NS 100 mL 100 mg (10/06/19 0954)  . vasopressin  (PITRESSIN) infusion - *FOR SHOCK* 0.03 Units/min (10/06/19 1100)    Assessment: 36 yoF admitted on 1/26 with COVID-19 pneumonia.  Pharmacy is consulted to transition from apixaban to heparin IV for Afib, possible apixaban failure with splenic infarcts, and r/o LV thrombus (echo w/ contrast pending).   Last apixaban 5 mg BID dose given on 1/27 at 0944  APTT 173 (verbally reported by lab), increased and supratherapeutic despite same heparin rate of 1300 unit/hr. Heparin level >  2.2 (verbally reported by lab) Heparin infusing through central line, labs drawn from a-line. SCr decreased to 1.74 CBC: Hgb stable at 12.5.  Plt low/improved at 115 (Baseline Plt 99 on admit) No bleeding or complications reported.  Goal of Therapy:  Heparin level 0.3-0.7 units/ml aPTT 66-102 seconds Monitor platelets by anticoagulation protocol: Yes   Plan:  HOLD heparin x1 hour Decrease to heparin IV infusion at 1050 units/hr starting at 18:00 APTT 8 hours after rate change Daily aPTT, heparin level, and CBC F/u updated weight   Gretta Arab PharmD, BCPS Clinical pharmacist phone 7am- 5pm: [phone] 10/06/2019 1:45 PM

## 2019-10-06 NOTE — Progress Notes (Signed)
Progress Note  Patient Name: Debra Mcconnell Date of Encounter: 10/06/2019  Primary Cardiologist: Shirlee More, MD   Subjective   Oxygen saturation on VBG 72.  Apparently we are unable to send a formal Co-ox from [facility]. Doing well on vaso. Was having NSVT on NE.   Inpatient Medications    Scheduled Meds: . buPROPion  100 mg Per Tube TID  . chlorhexidine gluconate (MEDLINE KIT)  15 mL Mouth Rinse BID  . Chlorhexidine Gluconate Cloth  6 each Topical Daily  . clonazepam  0.125 mg Oral BID  . dexamethasone  6 mg Per Tube Daily  . famotidine  20 mg Per Tube Daily  . feeding supplement (PRO-STAT SUGAR FREE 64)  30 mL Per Tube BID  . insulin aspart  2-6 Units Subcutaneous Q4H  . insulin aspart  5 Units Subcutaneous Q4H  . insulin detemir  15 Units Subcutaneous Q12H  . levothyroxine  175 mcg Per Tube Daily  . mouth rinse  15 mL Mouth Rinse 10 times per day  . oxyCODONE  5 mg Oral Q8H   Continuous Infusions: . sodium chloride 250 mL (10/05/19 1208)  . dextrose    . feeding supplement (VITAL AF 1.2 CAL) 1,000 mL (10/05/19 1444)  . fentaNYL infusion INTRAVENOUS 200 mcg/hr (10/06/19 1100)  . heparin 1,300 Units/hr (10/06/19 1100)  . norepinephrine (LEVOPHED) Adult infusion Stopped (10/05/19 1502)  . remdesivir 100 mg in NS 100 mL 100 mg (10/06/19 0954)  . vasopressin (PITRESSIN) infusion - *FOR SHOCK* 0.03 Units/min (10/06/19 1100)   PRN Meds: atropine, dextrose, docusate, fentaNYL, midazolam   Vital Signs    Vitals:   10/06/19 0815 10/06/19 0830 10/06/19 0834 10/06/19 0845  BP:   99/60   Pulse: 72 70 72 65  Resp: (!) 24 (!) 24 (!) 23 (!) 24  Temp:      TempSrc:      SpO2: 97% 96% 96% 97%  Weight:      Height:        Intake/Output Summary (Last 24 hours) at 10/06/2019 1146 Last data filed at 10/06/2019 1100 Gross per 24 hour  Intake 2129.36 ml  Output 1600 ml  Net 529.36 ml   Filed Weights   10/04/19 1809 10/05/19 1730 10/06/19 0600  Weight: 90.7 kg 104.5 kg  104.5 kg    Telemetry    Atrial flutter with heart rate in the 40s- Personally Reviewed  ECG    Atrial flutter, heart rate 67- Personally Reviewed  Physical Exam  Physical examination obtained by video link visit.  Heart rate 43, blood pressure of A-line 116/58, MAP 78  Drips currently are fentanyl to 25 with Versed intermittent.  Vasopressin is 0.03  GEN:  No acute distress, intubated on vent Neck:  No noticeable JVD noted Cardiac: Cardiac monitor with heart rate 43 Respiratory:  Nurse reports diffuse rhonchi and rails Neuro:   Intubated sedated on vent, she will open eyes but not follow commands  Labs    Chemistry Recent Labs  Lab 10/04/19 2107 10/04/19 2107 10/05/19 0458 10/05/19 1844 10/06/19 0400  NA 137   < > 143 145 141  K 4.2   < > 3.9 3.7 4.2  CL 102  --  110  --  110  CO2 20*  --  23  --  20*  GLUCOSE 396*  --  115*  --  130*  BUN 67*  --  72*  --  81*  CREATININE 1.88*  --  1.89*  --  1.74*  CALCIUM 7.5*  --  8.2*  --  8.1*  PROT 5.3*  --   --   --  5.4*  ALBUMIN 2.7*  --   --   --  2.8*  AST 58*  --   --   --  42*  ALT 100*  --   --   --  75*  ALKPHOS 67  --   --   --  62  BILITOT 2.3*  --   --   --  1.2  GFRNONAA 26*  --  26*  --  29*  GFRAA 31*  --  30*  --  34*  ANIONGAP 15  --  10  --  11   < > = values in this interval not displayed.     Hematology Recent Labs  Lab 10/04/19 2107 10/04/19 2107 10/05/19 0458 10/05/19 1844 10/06/19 0400  WBC 4.5  --  6.1  --  7.5  RBC 3.79*  --  3.76*  --  3.83*  HGB 12.4   < > 12.0 12.9 12.5  HCT 38.6   < > 38.1 38.0 39.1  MCV 101.8*  --  101.3*  --  102.1*  MCH 32.7  --  31.9  --  32.6  MCHC 32.1  --  31.5  --  32.0  RDW 15.8*  --  15.6*  --  15.9*  PLT 99*  --  105*  --  115*   < > = values in this interval not displayed.    Cardiac EnzymesNo results for input(s): TROPONINI in the last 168 hours. No results for input(s): TROPIPOC in the last 168 hours.   BNP Recent Labs  Lab 10/04/19 2107   BNP 2,040.2*     DDimer  Recent Labs  Lab 10/04/19 2320  DDIMER 2.37*     Radiology    DG CHEST PORT 1 VIEW  Result Date: 10/06/2019 CLINICAL DATA:  Respiratory failure, hypoxia EXAM: PORTABLE CHEST 1 VIEW COMPARISON:  10/05/2019 FINDINGS: No significant change in rotated AP portable examination, support apparatus including endotracheal tube, partially imaged enteric feeding tube, and right neck vascular catheter. Cardiomegaly. Diffuse interstitial pulmonary opacity and atelectasis or consolidation of the left lung base, with layering bilateral pleural effusions. No new or focal airspace opacity. Unchanged cardiomegaly. IMPRESSION: No significant interval change in the appearance of the chest with diffuse interstitial pulmonary opacity, likely edema, atelectasis or consolidation of the left lung base, and layering pleural effusions. Electronically Signed   By: Eddie Candle M.D.   On: 10/06/2019 08:10   DG Chest Port 1 View  Result Date: 10/05/2019 CLINICAL DATA:  Central line placement EXAM: PORTABLE CHEST 1 VIEW COMPARISON:  Earlier same day FINDINGS: Patient is significantly rotated. Right IJ central line overlies region of cavoatrial junction. Endotracheal and enteric tubes are again present. Stable lung aeration. No pneumothorax. IMPRESSION: Right IJ central line tip overlies cavoatrial junction. No pneumothorax. Stable lung aeration. Electronically Signed   By: Macy Mis M.D.   On: 10/05/2019 16:30   DG Chest Port 1 View  Result Date: 10/05/2019 CLINICAL DATA:  ETT, central line placement EXAM: PORTABLE CHEST 1 VIEW COMPARISON:  Radiograph 10/04/2019 FINDINGS: *Endotracheal tube terminates 4 cm from the carina in the mid trachea. *Transesophageal tube tip and side port distal to the GE junction, terminating below the level of imaging. *Telemetry leads and external catheter tubing overlie the chest. Increasingly confluent opacity is seen in the right infrahilar lung. There is  worsening  indistinctness of the left hemidiaphragm with increasing retrocardiac opacity as well possibly reflecting accumulating pleural fluid and/or airspace disease. No visible right effusion though the costophrenic sulcus is partially collimated. No pneumothorax. No acute osseous or soft tissue abnormality. Degenerative changes are present in the imaged spine and shoulders. IMPRESSION: 1. Increasing confluent opacity in the right infrahilar lung. 2. Worsening indistinctness of the left hemidiaphragm with increasing retrocardiac opacity possibly reflecting accumulating pleural fluid, worsening airspace disease or atelectasis. Electronically Signed   By: Lovena Le M.D.   On: 10/05/2019 06:16   DG Chest Port 1 View  Result Date: 10/04/2019 CLINICAL DATA:  Endotracheal tube position. Respiratory failure. EXAM: PORTABLE CHEST 1 VIEW COMPARISON:  Earlier chest x-ray, same date. FINDINGS: The endotracheal tube is 4.5 cm above the carina. The NG tube is coursing down the esophagus and into the stomach. Much improved lung aeration with re-expansion of the left upper lobe. The right lung is also much better aerated. Persistent left effusion. IMPRESSION: 1. Endotracheal tube and NG tubes in good position. 2. Much improved lung aeration since earlier film. Electronically Signed   By: Marijo Sanes M.D.   On: 10/04/2019 17:56   ECHOCARDIOGRAM COMPLETE  Result Date: 10/05/2019   ECHOCARDIOGRAM REPORT   Patient Name:   CERITA RABELO          Date of Exam: 10/05/2019 Medical Rec #:  [id number]              Height:       68.0 in Accession #:    [id number]             Weight:       200.0 lb Date of Birth:  1945/10/29             BSA:          2.04 m Patient Age:    33 years               BP:           116/57 mmHg Patient Gender: F                      HR:           46 bpm. Exam Location:  Inpatient [facility] Procedure: 2D Echo Indications:    Cardiogenic Shock  History:        Patient has no prior history of  Echocardiogram examinations.                 Covid-19 Positive; Risk Factors:Diabetes, Dyslipidemia and                 Hypertension.  Sonographer:    Mikki Santee RDCS (AE) Referring Phys: [id number] [place]  1. Left ventricular ejection fraction, by visual estimation, is 25 to 30%. The left ventricle has severely decreased function. There is no left ventricular hypertrophy.  2. Left ventricular diastolic parameters are indeterminate.  3. Moderately dilated left ventricular internal cavity size.  4. The left ventricle demonstrates global hypokinesis.  5. Global right ventricle has severely reduced systolic function.The right ventricular size is moderately enlarged. Right vetricular wall thickness was not assessed.  6. Left atrial size was severely dilated.  7. Right atrial size was moderately dilated.  8. Moderate pleural effusion in both left and right lateral regions.  9. Trivial pericardial effusion is present. 10. The mitral valve is normal in structure. Mild mitral valve regurgitation. 11. The  tricuspid valve is normal in structure. 12. The tricuspid valve is normal in structure. Tricuspid valve regurgitation is trivial. 13. The aortic valve is tricuspid. Aortic valve regurgitation is not visualized. 14. The pulmonic valve was grossly normal. Pulmonic valve regurgitation is trivial. 15. TR signal is inadequate for assessing pulmonary artery systolic pressure. 16. The inferior vena cava is dilated in size with <50% respiratory variability, suggesting right atrial pressure of 15 mmHg. 17. Severely reduced LVEF, with evidence of biventricular failure and elevated right atrial pressure. 18. There is no ECG tracing on this study, but patient appears to be in significant bradycardia throughout study. Based on imaging, may be flutter. Recommend clinical correlation. FINDINGS  Left Ventricle: Left ventricular ejection fraction, by visual estimation, is 25 to 30%. The left ventricle has severely  decreased function. The left ventricle demonstrates global hypokinesis. The left ventricular internal cavity size was moderately dilated left ventricle. There is no left ventricular hypertrophy. Left ventricular diastolic parameters are indeterminate. Right Ventricle: The right ventricular size is moderately enlarged. Right vetricular wall thickness was not assessed. Global RV systolic function is has severely reduced systolic function. Left Atrium: Left atrial size was severely dilated. Right Atrium: Right atrial size was moderately dilated Pericardium: Trivial pericardial effusion is present. There is a moderate pleural effusion in both left and right lateral regions. Mitral Valve: The mitral valve is normal in structure. Mild mitral valve regurgitation. Tricuspid Valve: The tricuspid valve is normal in structure. Tricuspid valve regurgitation is trivial. Aortic Valve: The aortic valve is tricuspid. Aortic valve regurgitation is not visualized. There is mild calcification of the aortic valve. Pulmonic Valve: The pulmonic valve was grossly normal. Pulmonic valve regurgitation is trivial. Pulmonic regurgitation is trivial. Aorta: The aortic root and ascending aorta are structurally normal, with no evidence of dilitation. Venous: The inferior vena cava is dilated in size with less than 50% respiratory variability, suggesting right atrial pressure of 15 mmHg. IAS/Shunts: No atrial level shunt detected by color flow Doppler.  LEFT VENTRICLE PLAX 2D LVIDd:         5.50 cm  Diastology LVIDs:         4.69 cm  LV e' lateral: 7.31 cm/s LV PW:         0.93 cm  LV e' medial:  3.88 cm/s LV IVS:        0.76 cm LVOT diam:     2.20 cm LV SV:         46 ml LV SV Index:   21.58 LVOT Area:     3.80 cm  RIGHT VENTRICLE RV S prime:     8.43 cm/s TAPSE (M-mode): 1.1 cm LEFT ATRIUM              Index       RIGHT ATRIUM           Index LA diam:        3.90 cm  1.91 cm/m  RA Area:     23.80 cm LA Vol (A2C):   154.0 ml 75.36 ml/m RA  Volume:   78.80 ml  38.56 ml/m LA Vol (A4C):   153.0 ml 74.87 ml/m LA Biplane Vol: 154.0 ml 75.36 ml/m   AORTA Ao Root diam: 2.80 cm MR Peak grad: 55.7 mmHg MR Vmax:      373.00 cm/s SHUNTS                           Systemic Diam:  2.20 cm  Buford Dresser MD Electronically signed by Buford Dresser MD Signature Date/Time: 10/05/2019/12:59:43 PM    Final     Cardiac Studies   Echocardiogram 10/05/2019: Impressions: 1. Left ventricular ejection fraction, by visual estimation, is 25 to 30%. The left ventricle has severely decreased function. There is no left ventricular hypertrophy. 2. Left ventricular diastolic parameters are indeterminate. 3. Moderately dilated left ventricular internal cavity size. 4. The left ventricle demonstrates global hypokinesis. 5. Global right ventricle has severely reduced systolic function.The right ventricular size is moderately enlarged. Right vetricular wall thickness was not assessed. 6. Left atrial size was severely dilated. 7. Right atrial size was moderately dilated. 8. Moderate pleural effusion in both left and right lateral regions. 9. Trivial pericardial effusion is present. 10. The mitral valve is normal in structure. Mild mitral valve regurgitation. 11. The tricuspid valve is normal in structure. 12. The tricuspid valve is normal in structure. Tricuspid valve regurgitation is trivial. 13. The aortic valve is tricuspid. Aortic valve regurgitation is not visualized. 14. The pulmonic valve was grossly normal. Pulmonic valve regurgitation is trivial. 15. TR signal is inadequate for assessing pulmonary artery systolic pressure. 16. The inferior vena cava is dilated in size with <50% respiratory variability, suggesting right atrial pressure of 15 mmHg. 17. Severely reduced LVEF, with evidence of biventricular failure and elevated right atrial pressure. 18. There is no ECG tracing on this study, but patient appears to be in significant  bradycardia throughout study. Based on imaging, may be flutter. Recommend clinical correlation.  Patient Profile     Ms. Wahba is a 74 year old female with history of atrial flutter on Eliquis, diabetes, sleep apnea who was admitted to [facility] with acute hypoxic respiratory failure secondary to coronavirus pneumonia.  She has subsequently been found to have acute renal failure, significant thrombocytopenia, new onset biventricular failure, new onset left bundle branch block and concerns for cardiogenic/septic shock.  Assessment & Plan    1. New onset biventricular failure, concerns for coronavirus myocarditis versus stress-induced cardiomyopathy -I personally reviewed her echocardiogram it shows severely reduced left ventricular function, 20 to 25% with severely reduced RV function. I do not know what her ejection fraction was before. She also has a new onset left bundle branch block. BNP over 2000. I have a strong suspicion this represents coronavirus myocarditis. -High-sensitivity troponin only mildly elevated around 300, CRP 5.2, ESR 16 -Her BNP severely elevated -She does have new onset left bundle branch block -Overall, unclear if this is myocarditis versus a stress-induced cardiomyopathy.  Treatment will be supportive nonetheless. -Maps appear to be in the 70s on 0.03 of vasopressin.  There were issues with norepinephrine including nonsustained VT.  Apparently vasopressin a better agent for her. -We are unable to send mixed coox formally.  The oxygen saturation on her VBG is 72 which is more consistent with sepsis.  We will repeat VBG. -Pressures marginally better off propofol. -Bentyl for said will be the better option here. -She is overall critically ill with new onset heart failure in the setting of multiorgan system failure including acute kidney injury, acute liver injury, acute respiratory failure. -I still think this is more of a sepsis picture.  Given the low diastolic  blood pressure and venous blood gas showing oxygen saturation 72 this points more toward sepsis. -I would recommend to start diuresis when able.  This may be problematic as she is still on pressors.  Will defer this to critical care medicine.  Atrial flutter with 4-1  block -She clearly has advanced conduction disease.  I would clearly expect her heart rate to be much higher given her critically ill state.  For now we will hold any AV nodal agents.  She is auto rate controlled.   -No current indication for pacing as her MAP is in the 70-80 -We will continue to monitor this  Splenic Infarcts? -She was on Eliquis at home but the dosing is unclear per my discussion with the critical care medicine physician, Dr. Ruthann Cancer.  I would give a strong recommendation to consider therapeutic heparin as she may have developed a clot on Eliquis versus developing a clot in her left ventricle.  On my review of her echocardiogram there is no definitive thrombus in the left ventricle. -It is a question if she has failed Eliquis. -For now she is on full dose anticoagulation with IV heparin -Limited echocardiogram with contrast pending to exclude LV thrombus  New Onset LBBB -Could represent myocarditis versus just development of cardiomyopathy.   -We will continue to monitor this.  For questions or updates, please contact [place] Please consult [email or web address] for contact info under Cardiology/STEMI.   [place] T. Audie Box, [place]  [address], [place], [place] [address] [phone]  1:13 PM

## 2019-10-06 NOTE — Progress Notes (Addendum)
ANTICOAGULATION CONSULT NOTE - Initial Consult  Pharmacy Consult for Heparin IV Indication: atrial fibrillation (holding apixaban)  Allergies  Allergen Reactions  . Gabapentin Other (See Comments)    Dizziness and drowsiness  . Lisinopril Cough    Patient Measurements: Height: 5\' 8"  (172.7 cm) Weight: 230 lb 6.1 oz (104.5 kg) IBW/kg (Calculated) : 63.9 Heparin Dosing Weight:  HEPARIN DW (KG): 87.3   Vital Signs: Temp: 99 F (37.2 C) (01/28 0400) Temp Source: Oral (01/28 0400) BP: 102/56 (01/28 0500) Pulse Rate: 78 (01/28 0530)  Labs: Recent Labs    10/04/19 2107 10/04/19 2107 10/04/19 2320 10/05/19 0458 10/05/19 0458 10/05/19 1513 10/05/19 1622 10/05/19 1700 10/05/19 1844 10/06/19 0400  HGB 12.4   < >  --  12.0   < >  --   --   --  12.9 12.5  HCT 38.6   < >  --  38.1  --   --   --   --  38.0 39.1  PLT 99*  --   --  105*  --   --   --   --   --  115*  APTT  --   --  33  --   --   --   --  34  --  83*  LABPROT  --   --  16.8*  --   --   --  18.2*  --   --   --   INR  --   --  1.4*  --   --   --  1.5*  --   --   --   HEPARINUNFRC  --   --   --   --   --   --   --  >2.20*  --   --   CREATININE 1.88*  --   --  1.89*  --   --   --   --   --  1.74*  TROPONINIHS  --   --   --   --   --  303*  --   --   --   --    < > = values in this interval not displayed.    Estimated Creatinine Clearance: 37.5 mL/min (A) (by C-G formula based on SCr of 1.74 mg/dL (H)).  Assessment: 42 yoF admitted on 1/26 with COVID-19 pneumonia.  Pharmacy is consulted to transition apixaban to heparin IV for Afib.  Cardiology consulted for biventricular failure with strong suspicion for coronavirus myocarditis and recommends change to heparin as she may have developed a clot on Eliquis versus developing a clot in her left ventricle.  Repeat a limited contrast echocardiogram to exclude LV thrombus. Last apixaban 5 mg BID dose given on 1/27 at 0944  SCr 1.89 CBC: Hgb 12, stable.  Plt low/improved  at 105 (Baseline Plt 99 on admit) No bleeding or complications reported.  10/06/19 0600 UPDATE: aPTT:  83 seconds, within therapeutic goal range Heparin level: >2.20 IU/mL, to be expected based on last apixaban dose RN reports no issues with bleeding or infusion site  H/H: 12.5/38; plates trending up  (115K today)   Goal of Therapy:  Heparin level 0.3-0.7 units/ml aPTT 66-102 seconds Monitor platelets by anticoagulation protocol: Yes   Plan:   Continue heparin infusion rate at 1300 units/hr  Re-check aPTT ~8 hours to confirm therapeutic level Daily aPTT, heparin level, and CBC Pt weight is  correct at 104.5kg   Despina Pole, Pharm. D. Clinical Pharmacist 10/06/2019 6:14 AM

## 2019-10-06 NOTE — Progress Notes (Signed)
Assisted tele visit to patient and bedside nurse with Cardiologist.  Maryelizabeth Rowan, RN \

## 2019-10-06 NOTE — Progress Notes (Signed)
  Echocardiogram 2D Echocardiogram has been performed.  Debra Mcconnell 10/06/2019, 2:30 PM

## 2019-10-06 NOTE — Plan of Care (Signed)
Pt remained on the ventilator throughout the shift. She received several PRN pushes of fentanyl and versed throughout the shift for pain. Her heparin levels were elevated, so heparin was stopped for one hour today. Heparin dose was readjusted by pharmacy. Patient has frequent PVC's on the monitor. Patient also received atropine this afternoon for HR's in the 30s, but remained normotensive through the bradycardia.   HCPOA, Debra Mcconnell was updated this afternoon regarding the plan of care and the patient's status. RN also requested for official HCPOA paperwork to be sent to charge nurse.    Problem: Education: Goal: Knowledge of risk factors and measures for prevention of condition will improve Outcome: Progressing   Problem: Coping: Goal: Psychosocial and spiritual needs will be supported Outcome: Progressing   Problem: Respiratory: Goal: Will maintain a patent airway Outcome: Progressing Goal: Complications related to the disease process, condition or treatment will be avoided or minimized Outcome: Progressing   Problem: Education: Goal: Knowledge of General Education information will improve Description: Including pain rating scale, medication(s)/side effects and non-pharmacologic comfort measures Outcome: Progressing   Problem: Health Behavior/Discharge Planning: Goal: Ability to manage health-related needs will improve Outcome: Progressing   Problem: Clinical Measurements: Goal: Ability to maintain clinical measurements within normal limits will improve Outcome: Progressing Goal: Will remain free from infection Outcome: Progressing Goal: Diagnostic test results will improve Outcome: Progressing Goal: Respiratory complications will improve Outcome: Progressing Goal: Cardiovascular complication will be avoided Outcome: Progressing   Problem: Activity: Goal: Risk for activity intolerance will decrease Outcome: Progressing   Problem: Nutrition: Goal: Adequate nutrition will be  maintained Outcome: Progressing   Problem: Coping: Goal: Level of anxiety will decrease Outcome: Progressing   Problem: Elimination: Goal: Will not experience complications related to bowel motility Outcome: Progressing Goal: Will not experience complications related to urinary retention Outcome: Progressing   Problem: Pain Managment: Goal: General experience of comfort will improve Outcome: Progressing   Problem: Safety: Goal: Ability to remain free from injury will improve Outcome: Progressing   Problem: Skin Integrity: Goal: Risk for impaired skin integrity will decrease Outcome: Progressing

## 2019-10-06 NOTE — Progress Notes (Signed)
NAME:  Debra Mcconnell, MRN:  [id number], DOB:  01-03-1946, LOS: 2 ADMISSION DATE:  10/04/2019, CONSULTATION DATE:  10/04/19 REFERRING MD:  [facility], CHIEF COMPLAINT:  SOB   Brief History   74 y/o F who presented to [facility] on 1/26 with reports of altered mental status.  Found to be hypoxic and COVID positive with AKI, hyperglycemia.  CTA chest negative for PE. CT head negative. Had worsening respiratory failure requiring intubation.  Patient transferred to [facility] for further care.   Past Medical History  DM II  CVA  HTN HLD A-Flutter - CHADS2Vasc score 5 Hypothyroidism  OSA   Significant Hospital Events   1/26 Admit to [facility] from [facility], intubated / COVID + 1/28 On Vaso, SvO2 72%  Consults:     Procedures:  ETT 1/26 >> R IJ TLC 1/27 >>   Significant Diagnostic Tests:  CT Head (OSH) 1/26 >>  CT C-Spine (OSH) 1/26 >>  CTA Chest 1/26 >> negative for PE, +pneumonia ECHO 1/27 >> LVEF 25-30%, global LV hypokinesis, LA severely dilated, RA moderately dilated, evidence of biventricular failure with elevated RA pressure  Micro Data:  COVID 1/26 >> positive Tracheal aspirate 1/26 >>  BCx2 1/26 >>  MRSA PCR 1/27 >> negative UC 1/27 >> negative   Antimicrobials:  Remdesivir 1/26 >> Decadron 1/26 >>   Interim history/subjective:  Tmax 99 / WBC 7.5  PEEP 12, FiO2 40% Glucose Range 130-171 I/O 1.6L UOP, 157ml+ in 24h Remains on vasopressin gtt RN reports pt largely comfortable on fentanyl alone but does have periods of agitation.   Objective   Blood pressure 115/75, pulse 77, temperature 99 F (37.2 C), temperature source Oral, resp. rate (!) 24, height 5\' 8"  (1.727 m), weight 104.5 kg, SpO2 98 %.    Vent Mode: PRVC FiO2 (%):  [40 %-50 %] 40 % Set Rate:  [24 bmp] 24 bmp Vt Set:  [440 mL] 440 mL PEEP:  [12 cmH20] 12 cmH20 Plateau Pressure:  [20 cmH20-22 cmH20] 22 cmH20   Intake/Output Summary (Last 24 hours) at 10/06/2019 0830 Last data filed at  10/06/2019 0600 Gross per 24 hour  Intake 1941.07 ml  Output 1760 ml  Net 181.07 ml   Filed Weights   10/04/19 1809 10/05/19 1730 10/06/19 0600  Weight: 90.7 kg 104.5 kg 104.5 kg    Examination: General: elderly female lying in bed in NAD on vent, critically ill appearing   HEENT: MM pink/moist, ETT Neuro: sedate, arouses to voice / opens eyes then drifts back to sleep, moves spontaneously CV: s1s2 regular, A-flutter with 4:1 conduction, PVC's on monitor, no appreciable m/r/g PULM: non-labored on vent, lungs bilaterally diminished  GI: soft, bsx4 active  Extremities: warm/dry, 1-2+ BLE pitting edema  Skin: no rashes or lesions  CXR 1/28 >> images personally reviewed, difficult to discern end of ETT as obscured by feeding tube, cardiomegaly, left base opacity, possible effusion   Resolved Hospital Problem list      Assessment & Plan:   Acute Hypoxemic Respiratory Failure secondary to COVID PNA ARDS -low Vt ventilation 4-8cc/kg -goal plateau pressure <30, driving pressure [id number] cm H2O -target PaO2 55-65, titrate PEEP/FiO2 per ARDS protocol  -if P/F ratio <150, consider prone therapy for 16 hours per day -goal CVP <4, diuresis as necessary -VAP prevention measures  -follow intermittent CXR  -decadron, remdesivir  Sedation Needs in setting of Mechanical Ventilation  -PAD protocol with fentanyl, PRN versed -add low dose klonopin, oxycodone PT  -doubt propofol  was large contribution to soft normal pressures (she was only on 5 mcg's) / pressures largely the same off propofol  -RASS Goal -1 to -2  -delirium prevention measures  AKI  Lactic Acidosis  -Trend BMP / urinary output -Replace electrolytes as indicated -Avoid nephrotoxic agents, ensure adequate renal perfusion  Thrombocytopenia  -monitor platelets, bleeding  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  None documented in Care Everywhere available to see. Follows with Dr. Bettina Gavia.   New LBBB on admit, BNP >2k.  Suspect element of congestion with elevated LFT's, CR.  -appreciate Cardiology evaluation  -note SvO2 72% -continue vasopressin for increased SVR -after following her for several days, she has had ectopy on all agents (neo, levo and vaso > appears PVC's element of baseline as we have seen it even when she was not on pressors)  Hx HTN, HLD, CVA Hx AFlutter, 4:1 Conduction New LBBB -continue heparin gtt -hold home agents > norvasc, lipitor, coreg, lasix, olmesartan, hydralazine   Splenic Infarcts?  On eliquis at baseline. 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   -? If this was failure on eliquis but we are not certain if she was taking her medications as prescribed -continue heparin infusion for now  Hx OSA  -CPAP QHS post extubation  DM II with Peripheral Neuropathy  Hgb A1c 7  -SSI, standard scale  -continue levemir 15 units BID  -hold home amaryl  -continue gabapentin   Hypothyroidism  TSH 0.792 on admit  -continue synthroid   GERD -continue PPI, on omeprazole prior to admit   Anxiety / Depression  -continue wellbutrin -hold home ambien   Hx MRSA Foot Wound Infection  Positive on 05/13/2019 -healed, no acute interventions   Best practice:  Diet: NPO, TF ordered Pain/Anxiety/Delirium protocol (if indicated): per protocol VAP protocol (if indicated): per protocol DVT prophylaxis: heparin gtt GI prophylaxis: PPI  Glucose control: SSI Mobility: BR  Code Status: full Family Communication: Radio broadcast assistant (HCPOA) updated 1/28.  Patient has a brother in [place] and [place] communicates with him regarding plan of care. I asked her to consider the concept of no CPR in the event of arrest but otherwise continue full scope of care. She states that Mrs. Ferris has a living will and does not want prolonged artifical support.    Disposition: ICU  Labs   CBC: Recent Labs  Lab 10/04/19 1806 10/04/19 2107  10/05/19 0458 10/05/19 1844 10/06/19 0400  WBC  --  4.5 6.1  --  7.5  NEUTROABS  --  4.1  --   --   --   HGB 12.9 12.4 12.0 12.9 12.5  HCT 38.0 38.6 38.1 38.0 39.1  MCV  --  101.8* 101.3*  --  102.1*  PLT  --  99* 105*  --  115*    Basic Metabolic Panel: Recent Labs  Lab 10/04/19 1806 10/04/19 2107 10/05/19 0458 10/05/19 1844 10/06/19 0400  NA 136 137 143 145 141  K 4.2 4.2 3.9 3.7 4.2  CL  --  102 110  --  110  CO2  --  20* 23  --  20*  GLUCOSE  --  396* 115*  --  130*  BUN  --  67* 72*  --  81*  CREATININE  --  1.88* 1.89*  --  1.74*  CALCIUM  --  7.5* 8.2*  --  8.1*  MG  --  2.0 2.1  --   --  PHOS  --  5.2* 4.1  --   --    GFR: Estimated Creatinine Clearance: 37.5 mL/min (A) (by C-G formula based on SCr of 1.74 mg/dL (H)). Recent Labs  Lab 10/04/19 2107 10/04/19 2320 10/05/19 0458 10/05/19 1419 10/05/19 1518 10/06/19 0400  PROCALCITON 12.78  --   --   --   --   --   WBC 4.5  --  6.1  --   --  7.5  LATICACIDVEN 1.5 3.0*  --  1.4 1.0  --     Liver Function Tests: Recent Labs  Lab 10/04/19 2107 10/06/19 0400  AST 58* 42*  ALT 100* 75*  ALKPHOS 67 62  BILITOT 2.3* 1.2  PROT 5.3* 5.4*  ALBUMIN 2.7* 2.8*   No results for input(s): LIPASE, AMYLASE in the last 168 hours. No results for input(s): AMMONIA in the last 168 hours.  ABG    Component Value Date/Time   PHART 7.275 (L) 10/04/2019 1806   PCO2ART 40.0 10/04/2019 1806   PO2ART 162.0 (H) 10/04/2019 1806   HCO3 24.0 10/05/2019 1844   TCO2 25 10/05/2019 1844   ACIDBASEDEF 2.0 10/05/2019 1844   O2SAT 72.0 10/05/2019 1844     Coagulation Profile: Recent Labs  Lab 10/04/19 2320 10/05/19 1622  INR 1.4* 1.5*    Cardiac Enzymes: No results for input(s): CKTOTAL, CKMB, CKMBINDEX, TROPONINI in the last 168 hours.  HbA1C: No results found for: HGBA1C  CBG: Recent Labs  Lab 10/05/19 1134 10/05/19 1639 10/05/19 2009 10/06/19 0001 10/06/19 0506  GLUCAP 74 127* 171* 159* 135*     Critical care time: 32 minutes    Noe Gens, MSN, NP-C [facility] 10/06/2019, 8:30 AM   Please see [email or web address] for pager details.

## 2019-10-06 NOTE — Progress Notes (Signed)
Patient's neighbor is [employer] [phone]); TCT [name] requesting HCPOA documents need to be on the patient's chart. [name] stated that she will call the nursing staff and coordinate a time to drop the paperwork off today. [place] Supervisor [phone]

## 2019-10-06 NOTE — Progress Notes (Signed)
RN spoke with Debra Mcconnell, HCPOA and updated her on the patient. All questions were answered. RN requested a copy of HCPOA paperwork. She will be sending it shortly.

## 2019-10-06 NOTE — Progress Notes (Signed)
NAME:  Debra Mcconnell, MRN:  [id number], DOB:  02/19/1946, LOS: 2 ADMISSION DATE:  10/04/2019, CONSULTATION DATE:  10/04/19 REFERRING MD:  [facility], CHIEF COMPLAINT:  SOB   Brief History   74 y/o F who presented to [facility] on 1/26 with reports of altered mental status.  Found to be hypoxic and COVID positive with AKI, hyperglycemia.  CTA chest negative for PE. CT head negative. Had worsening respiratory failure requiring intubation.  Patient transferred to [facility] for further care. Course notable for BiVentricular failure with concern for COVID myocarditis.   Past Medical History  DM II  CVA  HTN HLD A-Flutter - CHADS2Vasc score 5 Hypothyroidism  OSA   Significant Hospital Events   1/26 Admit to [facility] from [facility], intubated / COVID + 1/28 On Vaso, SvO2 72%  Consults:     Procedures:  ETT 1/26 >> R IJ TLC 1/27 >>   Significant Diagnostic Tests:  CT Head (OSH) 1/26 >>  CT C-Spine (OSH) 1/26 >>  CTA Chest 1/26 >> negative for PE, +pneumonia ECHO 1/27 >> LVEF 25-30%, global LV hypokinesis, LA severely dilated, RA moderately dilated, evidence of biventricular failure with elevated RA pressure  Micro Data:  COVID 1/26 >> positive Tracheal aspirate 1/26 >>  BCx2 1/26 >>  MRSA PCR 1/27 >> negative UC 1/27 >> negative   Antimicrobials:  Remdesivir 1/26 >> Decadron 1/26 >>   Interim history/subjective:  Tmax 98.9 FiO2 0.40, PEEP 5 Glucose Range 135-222 I/O -470cc total fentanyl 300 mcg/h SvO2  66% 1/28, Vasopressin still running   Objective   Blood pressure 121/69, pulse 64, temperature 98.1 F (36.7 C), temperature source Axillary, resp. rate (!) 24, height 5\' 8"  (1.727 m), weight 104.5 kg, SpO2 97 %.    Vent Mode: PRVC FiO2 (%):  [40 %-50 %] 40 % Set Rate:  [24 bmp] 24 bmp Vt Set:  [440 mL] 440 mL PEEP:  [8 [id number] cmH20] 8 cmH20 Plateau Pressure:  [20 cmH20-23 cmH20] 21 cmH20   Intake/Output Summary (Last 24 hours) at 10/06/2019  1322 Last data filed at 10/06/2019 1100 Gross per 24 hour  Intake 2008.76 ml  Output 1500 ml  Net 508.76 ml   Filed Weights   10/04/19 1809 10/05/19 1730 10/06/19 0600  Weight: 90.7 kg 104.5 kg 104.5 kg    Examination: General: obese woman, intubated HEENT: ett in place Neuro: opened eyes to voice, followed commands on fent 300 CV: distant, no M PULM: bilateral soft crackles GI: very hypoactive BS Extremities: warm/dry, no pretibial edema Skin: no rashes or lesions  Resolved Hospital Problem list   Hx MRSA Foot Wound Infection - positive on 05/13/2019. Healed, no acute interventions during admit  Assessment & Plan:   Acute Hypoxemic Respiratory Failure secondary to COVID PNA ARDS Mechanical ventilation via ARDS protocol, target PRVC 6 cc/kg Wean PEEP and FiO2 as able, currently 0.40 and PEEP 5.  Hopefully begin to transition to lighter sedation and SBT over the next 24 hours Goal plateau pressure less than 30, driving pressure less than 15 No indication paralytics or prone positioning at this time Sedation per PAD protocol, goal RASS -1 Diuresis when blood pressure and renal function can tolerate, goal CVP 5-8.  Appreciate cardiology assistance.  Received single dose Lasix 1/28.  Consider repeat 1/29 VAP prevention order set Dexamethasone, plan 10 days Remdesivir, plan 5 days  Sedation Needs in setting of Mechanical Ventilation  PAD protocol, fentanyl, Versed as needed On low-dose supplemental clonazepam, oxycodone Bupropion  AKI  Lactic Acidosis, cleared Follow BMP, urine output Replace electrolytes as indicated Ensure adequate renal perfusion, avoid nephrotoxins Would like to continue gentle diuresis as renal function will tolerate  Thrombocytopenia  Following CBC, following for any evidence of bleeding  COVID Myocarditis  HFrEF / Biventricular Failure with combined cardiogenic, possibly septic shock Unknown baseline EF. Follows with Dr. Bettina Gavia.  New LBBB on  admit, BNP >2k.  Suspect element of congestion with elevated LFT's Appreciate cardiology management, evaluation. No SVO 2 available 1/29 but given hemodynamics no clear indication for inotropes Goal vasopressin to off on 1/29 Follow-up cardiology after this acute event to ensure recovery EF by echocardiogram  Hx HTN, HLD, CVA Hx AFlutter, 4:1 Conduction New LBBB Heparin infusion Home carvedilol, amlodipine, olmesartan, hydralazine are all on hold Home Lipitor on hold  Splenic Infarcts?  On eliquis at baseline. 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   ? If this was failure on eliquis but we are not certain if she was taking her medications as prescribed Continue heparin per pharmacy for now  Hx OSA  Will require CPAP postextubation  DM II with Peripheral Neuropathy  Hgb A1c 7  Sliding-scale insulin as per protocol Tube feed coverage 5 units every 4 hours Levemir 15 units twice daily Gabapentin as ordered Home Amaryl on hold  Hypothyroidism  TSH 0.792 on admit  Synthroid as ordered  GERD Pepcid  Anxiety / Depression  Home Wellbutrin Ambien held    Best practice:  Diet: NPO, TF ordered Pain/Anxiety/Delirium protocol (if indicated): Ordered VAP protocol (if indicated): Ordered DVT prophylaxis: heparin gtt GI prophylaxis: Pepcid Glucose control: SSI Mobility: BR   Code Status: full Family Communication: Neighbor [name] Markwood (HCPOA) updated by Cherlynn Kaiser on 1/29. Please refer also to her note. Ms Cammie Mcgee agrees that the patient would not benefit from or want CPR, reintubation once ultimately extubated. Code status changed in chart to reflect these wishes.  Patient has a brother in [place] and [place] communicates with him regarding plan of care.    Disposition: ICU  Labs   CBC: Recent Labs  Lab 10/04/19 1806 10/04/19 2107 10/05/19 0458 10/05/19 1844 10/06/19 0400  WBC  --  4.5 6.1  --  7.5  NEUTROABS  --  4.1  --   --   --   HGB  12.9 12.4 12.0 12.9 12.5  HCT 38.0 38.6 38.1 38.0 39.1  MCV  --  101.8* 101.3*  --  102.1*  PLT  --  99* 105*  --  115*    Basic Metabolic Panel: Recent Labs  Lab 10/04/19 1806 10/04/19 2107 10/05/19 0458 10/05/19 1844 10/06/19 0400  NA 136 137 143 145 141  K 4.2 4.2 3.9 3.7 4.2  CL  --  102 110  --  110  CO2  --  20* 23  --  20*  GLUCOSE  --  396* 115*  --  130*  BUN  --  67* 72*  --  81*  CREATININE  --  1.88* 1.89*  --  1.74*  CALCIUM  --  7.5* 8.2*  --  8.1*  MG  --  2.0 2.1  --   --   PHOS  --  5.2* 4.1  --   --    GFR: Estimated Creatinine Clearance: 37.5 mL/min (A) (by C-G formula based on SCr of 1.74 mg/dL (H)). Recent Labs  Lab 10/04/19 2107 10/04/19 2320 10/05/19 0458 10/05/19 1419 10/05/19 1518 10/06/19 0400  PROCALCITON  12.78  --   --   --   --   --   WBC 4.5  --  6.1  --   --  7.5  LATICACIDVEN 1.5 3.0*  --  1.4 1.0  --     Liver Function Tests: Recent Labs  Lab 10/04/19 2107 10/06/19 0400  AST 58* 42*  ALT 100* 75*  ALKPHOS 67 62  BILITOT 2.3* 1.2  PROT 5.3* 5.4*  ALBUMIN 2.7* 2.8*   No results for input(s): LIPASE, AMYLASE in the last 168 hours. No results for input(s): AMMONIA in the last 168 hours.  ABG    Component Value Date/Time   PHART 7.275 (L) 10/04/2019 1806   PCO2ART 40.0 10/04/2019 1806   PO2ART 162.0 (H) 10/04/2019 1806   HCO3 24.0 10/05/2019 1844   TCO2 25 10/05/2019 1844   ACIDBASEDEF 2.0 10/05/2019 1844   O2SAT 72.0 10/05/2019 1844     Coagulation Profile: Recent Labs  Lab 10/04/19 2320 10/05/19 1622  INR 1.4* 1.5*    Cardiac Enzymes: No results for input(s): CKTOTAL, CKMB, CKMBINDEX, TROPONINI in the last 168 hours.  HbA1C: No results found for: HGBA1C  CBG: Recent Labs  Lab 10/05/19 2009 10/06/19 0001 10/06/19 0506 10/06/19 0743 10/06/19 1225  GLUCAP 171* 159* 135* 171* 222*    Critical care time: 33 minutes     Baltazar Apo, MD, PhD 10/07/2019, 10:52 AM [facility] [phone] or if no answer [phone]

## 2019-10-07 ENCOUNTER — Inpatient Hospital Stay (HOSPITAL_COMMUNITY): Payer: Medicare Other

## 2019-10-07 DIAGNOSIS — I400 Infective myocarditis: Secondary | ICD-10-CM

## 2019-10-07 DIAGNOSIS — J9601 Acute respiratory failure with hypoxia: Secondary | ICD-10-CM

## 2019-10-07 LAB — COMPREHENSIVE METABOLIC PANEL
ALT: 56 U/L — ABNORMAL HIGH (ref 0–44)
AST: 27 U/L (ref 15–41)
Albumin: 2.7 g/dL — ABNORMAL LOW (ref 3.5–5.0)
Alkaline Phosphatase: 56 U/L (ref 38–126)
Anion gap: 11 (ref 5–15)
BUN: 87 mg/dL — ABNORMAL HIGH (ref 8–23)
CO2: 22 mmol/L (ref 22–32)
Calcium: 8.1 mg/dL — ABNORMAL LOW (ref 8.9–10.3)
Chloride: 111 mmol/L (ref 98–111)
Creatinine, Ser: 1.54 mg/dL — ABNORMAL HIGH (ref 0.44–1.00)
GFR calc Af Amer: 39 mL/min — ABNORMAL LOW (ref >60)
GFR calc non Af Amer: 34 mL/min — ABNORMAL LOW (ref >60)
Glucose, Bld: 171 mg/dL — ABNORMAL HIGH (ref 70–99)
Potassium: 3.8 mmol/L (ref 3.5–5.1)
Sodium: 144 mmol/L (ref 135–145)
Total Bilirubin: 1.3 mg/dL — ABNORMAL HIGH (ref 0.3–1.2)
Total Protein: 5.1 g/dL — ABNORMAL LOW (ref 6.5–8.1)

## 2019-10-07 LAB — CBC
HCT: 37.4 % (ref 36.0–46.0)
Hemoglobin: 11.6 g/dL — ABNORMAL LOW (ref 12.0–15.0)
MCH: 31.6 pg (ref 26.0–34.0)
MCHC: 31 g/dL (ref 30.0–36.0)
MCV: 101.9 fL — ABNORMAL HIGH (ref 80.0–100.0)
Platelets: 106 10*3/uL — ABNORMAL LOW (ref 150–400)
RBC: 3.67 MIL/uL — ABNORMAL LOW (ref 3.87–5.11)
RDW: 16 % — ABNORMAL HIGH (ref 11.5–15.5)
WBC: 6.1 10*3/uL (ref 4.0–10.5)
nRBC: 0 % (ref 0.0–0.2)

## 2019-10-07 LAB — GLUCOSE, CAPILLARY
Glucose-Capillary: 135 mg/dL — ABNORMAL HIGH (ref 70–99)
Glucose-Capillary: 169 mg/dL — ABNORMAL HIGH (ref 70–99)
Glucose-Capillary: 147 mg/dL — ABNORMAL HIGH (ref 70–99)
Glucose-Capillary: 152 mg/dL — ABNORMAL HIGH (ref 70–99)
Glucose-Capillary: 211 mg/dL — ABNORMAL HIGH (ref 70–99)

## 2019-10-07 LAB — APTT
aPTT: 168 seconds (ref 24–36)
aPTT: 115 seconds — ABNORMAL HIGH (ref 24–36)
aPTT: 74 seconds — ABNORMAL HIGH (ref 24–36)

## 2019-10-07 LAB — HEMOGLOBIN A1C
Hgb A1c MFr Bld: 6.4 % — ABNORMAL HIGH (ref 4.8–5.6)
Mean Plasma Glucose: 137 mg/dL

## 2019-10-07 LAB — HEPARIN LEVEL (UNFRACTIONATED): Heparin Unfractionated: 2.16 IU/mL — ABNORMAL HIGH (ref 0.30–0.70)

## 2019-10-07 MED ORDER — HEPARIN (PORCINE) 25000 UT/250ML-% IV SOLN
750.0000 [IU]/h | INTRAVENOUS | Status: DC
  Administered 2019-10-07: 20:00:00 750 [IU]/h via INTRAVENOUS
  Filled 2019-10-07 (×2): qty 250

## 2019-10-07 MED ORDER — GABAPENTIN 250 MG/5ML PO SOLN
300.0000 mg | Freq: Three times a day (TID) | ORAL | Status: DC
  Administered 2019-10-07 – 2019-10-18 (×32): 300 mg
  Filled 2019-10-07 (×36): qty 6

## 2019-10-07 MED ORDER — FUROSEMIDE 10 MG/ML IJ SOLN
40.0000 mg | Freq: Once | INTRAMUSCULAR | Status: AC
  Administered 2019-10-07: 40 mg via INTRAVENOUS
  Filled 2019-10-07: qty 4

## 2019-10-07 MED ORDER — HEPARIN (PORCINE) 25000 UT/250ML-% IV SOLN
850.0000 [IU]/h | INTRAVENOUS | Status: DC
  Administered 2019-10-07: 05:00:00 850 [IU]/h via INTRAVENOUS

## 2019-10-07 NOTE — Plan of Care (Addendum)
  Interdisciplinary Goals of Care Family Meeting   Date carried out:: 10/07/2019  Location of the meeting: Phone conference  Member's involved: Nurse Practitioner and Family Member or next of kin  [place] or acting medical decision maker:  [name] Markwood (HCPOA), neighbor   Discussion: We discussed goals of care for [employer] .  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  At baseline she has a hx CVA & at times has difficulty finding words. She requires slow speech and time to process to understand concepts.  [name] talked with the patients brother regarding her goals of care and prior expressed wishes.  We reviewed that Mrs. Armendariz is making progress in terms of weaning ventilator requirements and off vasopressors as of 1/29. We reviewed the concept of CPR and possibility of need for re-intubation post tube removal.  [name] feels that Mrs. Rawn would not to be reintubated or have CPR.  She reviewed this with the patients brother who is in agreement.  Will continue to work toward successful extubation with no plans of reintubation.   Code status: Full DNR in the event of arrest, no reintubation if fails during current admission.   Disposition: Continue current acute care  Time spent for the meeting: 20 minutes   Noe Gens, MSN, NP-C [facility] 10/07/2019, 2:50 PM   Please see [email or web address] for pager details.

## 2019-10-07 NOTE — Progress Notes (Signed)
Assisted tele visit to patient with provider.  Debra Mcconnell, Debra Hoard, RN

## 2019-10-07 NOTE — Progress Notes (Signed)
[place] for Heparin IV Indication: atrial fibrillation, r/o LV thrombus, splenic infarct (PTA apixaban)  Allergies  Allergen Reactions  . Gabapentin Other (See Comments)    Dizziness and drowsiness  . Lisinopril Cough    Patient Measurements: Height: 5\' 8"  (172.7 cm) Weight: 230 lb 6.1 oz (104.5 kg) IBW/kg (Calculated) : 63.9 Heparin Dosing Weight: 83 kg  Vital Signs: Temp: 98 F (36.7 C) (01/29 2100) Temp Source: Axillary (01/29 2100) BP: 118/58 (01/29 2100) Pulse Rate: 73 (01/29 2324)  Labs: Recent Labs    10/05/19 0458 10/05/19 1513 10/05/19 1622 10/05/19 1700 10/06/19 0400 10/06/19 0400 10/06/19 1329 10/06/19 1413 10/06/19 1413 10/07/19 0230 10/07/19 1343 10/07/19 2224  HGB 12.0  --   --    < > 12.5   < > 12.2  --   --  11.6*  --   --   HCT 38.1  --   --    < > 39.1  --  36.0  --   --  37.4  --   --   PLT 105*  --   --   --  115*  --   --   --   --  106*  --   --   APTT  --   --   --    < > 83*   < >  --  173*   < > 168* 115* 74*  LABPROT  --   --  18.2*  --   --   --   --   --   --   --   --   --   INR  --   --  1.5*  --   --   --   --   --   --   --   --   --   HEPARINUNFRC  --   --   --    < > >2.20*  --   --  >2.20*  --  2.16*  --   --   CREATININE 1.89*  --   --   --  1.74*  --   --   --   --  1.54*  --   --   TROPONINIHS  --  303*  --   --   --   --   --   --   --   --   --   --    < > = values in this interval not displayed.    Estimated Creatinine Clearance: 42.4 mL/min (A) (by C-G formula based on SCr of 1.54 mg/dL (H)).  Assessment: 78 yoF admitted on 1/26 with COVID-19 pneumonia.  Pharmacy is consulted to transition from apixaban to heparin IV for Afib, possible apixaban failure with splenic infarcts, and r/o LV thrombus (echo w/ contrast pending).   Last apixaban 5 mg BID dose given on 1/27 at 0944  10/08/19 0000 aPTT: 74 seconds-->now in therapeutic range on 750 units/hr  RN  confirms no bleeding or  infusion site issues at this time   Goal of Therapy:  Heparin level 0.3-0.7 units/ml aPTT 66-102 seconds Monitor platelets by anticoagulation protocol: Yes   Plan:  Continue heparin  infusion rate at 750 units/hr    Daily aPTT, heparin level, and CBC Monitor signs and symptoms of bleeding    Despina Pole, Pharm. D. Clinical Pharmacist 10/07/2019 11:58 PM

## 2019-10-07 NOTE — Progress Notes (Signed)
Sputum culture sent to the lab per MD order.

## 2019-10-07 NOTE — Progress Notes (Addendum)
Progress Note  Due to the COVID-19 pandemic, this visit was completed with telemedicine (audio/video) technology to reduce patient and provider exposure as well as to preserve personal protective equipment.   Patient Name: Debra Mcconnell Date of Encounter: 10/07/2019  Primary Cardiologist: Shirlee More, MD   Subjective   Visit obtained using e link.  My examination and evaluation was performed with the help of nursing via a video e link session.  The patient remains on vasopressin.  Oxygenation requirements are minimal FiO2 40% PEEP 5.  Debra Mcconnell will not follow commands.  She will open eyes when aroused but there is no purposeful movement reported.  She was diuresed yesterday.  Has intermittent bradycardia.  Blood pressure 123/58 with map of 83 on 0.03 of vasopressin.  Inpatient Medications    Scheduled Meds: . buPROPion  100 mg Per Tube TID  . chlorhexidine gluconate (MEDLINE KIT)  15 mL Mouth Rinse BID  . Chlorhexidine Gluconate Cloth  6 each Topical Daily  . clonazepam  0.125 mg Oral BID  . dexamethasone  6 mg Per Tube Daily  . famotidine  20 mg Per Tube Daily  . feeding supplement (PRO-STAT SUGAR FREE 64)  30 mL Per Tube BID  . insulin aspart  2-6 Units Subcutaneous Q4H  . insulin aspart  5 Units Subcutaneous Q4H  . insulin detemir  15 Units Subcutaneous Q12H  . levothyroxine  175 mcg Per Tube Daily  . mouth rinse  15 mL Mouth Rinse 10 times per day  . oxyCODONE  5 mg Oral Q8H   Continuous Infusions: . sodium chloride 250 mL (10/05/19 1208)  . dextrose    . feeding supplement (VITAL AF 1.2 CAL) 1,000 mL (10/07/19 0509)  . fentaNYL infusion INTRAVENOUS 300 mcg/hr (10/07/19 0518)  . heparin 850 Units/hr (10/07/19 0509)  . norepinephrine (LEVOPHED) Adult infusion Stopped (10/05/19 1502)  . remdesivir 100 mg in NS 100 mL 100 mg (10/07/19 0953)  . vasopressin (PITRESSIN) infusion - *FOR SHOCK* 0.03 Units/min (10/06/19 1843)   PRN Meds: atropine, dextrose, docusate,  fentaNYL, midazolam   Vital Signs    Vitals:   10/07/19 1045 10/07/19 1100 10/07/19 1109 10/07/19 1115  BP:  130/76 (!) 117/47   Pulse: (!) 38 (!) 123 (!) 47 (!) 43  Resp: (!) 24 (!) 24 (!) 23 (!) 24  Temp:      TempSrc:      SpO2: 92% 92% 92% 92%  Weight:      Height:        Intake/Output Summary (Last 24 hours) at 10/07/2019 1207 Last data filed at 10/07/2019 1000 Gross per 24 hour  Intake 2030.84 ml  Output 3650 ml  Net -1619.16 ml   Last 3 Weights 10/06/2019 10/05/2019 10/04/2019  Weight (lbs) 230 lb 6.1 oz 230 lb 6.1 oz 199 lb 15.3 oz  Weight (kg) 104.5 kg 104.5 kg 90.7 kg      Telemetry    Atrial flutter with intermittent bradycardia, rates in the 30s at times per nursing, rate noted to be 60 during my video examination- Personally Reviewed  EKG - 01/26 Atrial flutter, heart rate 67- Personally Reviewed  Physical Exam   Physical examination obtained by video link visit.  Heart rate 70s, blood pressure of A-line 123/58 MAP 83  Drips currently are fentanyl 300 mcg/hr to 25 with Versed intermittent.  Vasopressin is 0.03 (no change)  GEN:  intubated and sedated on the vent Neck:  No JVD seen Cardiac: HR occ 40s, not  sustained Respiratory:  diminished lung sounds Neuro:   eyes are open, no response to commands, on Fentanyl  Labs    Chemistry Recent Labs  Lab 10/04/19 2107 10/04/19 2107 10/05/19 0458 10/05/19 1844 10/06/19 0400 10/06/19 1329 10/07/19 0230  NA 137   < > 143   < > 141 145 144  K 4.2   < > 3.9   < > 4.2 4.0 3.8  CL 102   < > 110  --  110  --  111  CO2 20*   < > 23  --  20*  --  22  GLUCOSE 396*   < > 115*  --  130*  --  171*  BUN 67*   < > 72*  --  81*  --  87*  CREATININE 1.88*   < > 1.89*  --  1.74*  --  1.54*  CALCIUM 7.5*   < > 8.2*  --  8.1*  --  8.1*  PROT 5.3*  --   --   --  5.4*  --  5.1*  ALBUMIN 2.7*  --   --   --  2.8*  --  2.7*  AST 58*  --   --   --  42*  --  27  ALT 100*  --   --   --  75*  --  56*  ALKPHOS 67  --   --    --  62  --  56  BILITOT 2.3*  --   --   --  1.2  --  1.3*  GFRNONAA 26*   < > 26*  --  29*  --  34*  GFRAA 31*   < > 30*  --  34*  --  39*  ANIONGAP 15   < > 10  --  11  --  11   < > = values in this interval not displayed.     Hematology Recent Labs  Lab 10/05/19 0458 10/05/19 1844 10/06/19 0400 10/06/19 1329 10/07/19 0230  WBC 6.1  --  7.5  --  6.1  RBC 3.76*  --  3.83*  --  3.67*  HGB 12.0   < > 12.5 12.2 11.6*  HCT 38.1   < > 39.1 36.0 37.4  MCV 101.3*  --  102.1*  --  101.9*  MCH 31.9  --  32.6  --  31.6  MCHC 31.5  --  32.0  --  31.0  RDW 15.6*  --  15.9*  --  16.0*  PLT 105*  --  115*  --  106*   < > = values in this interval not displayed.    Cardiac EnzymesNo results for input(s): TROPONINI in the last 168 hours. No results for input(s): TROPIPOC in the last 168 hours.   BNP Recent Labs  Lab 10/04/19 2107  BNP 2,040.2*     DDimer  Recent Labs  Lab 10/04/19 2320  DDIMER 2.37*     Radiology    DG CHEST PORT 1 VIEW  Result Date: 10/07/2019 CLINICAL DATA:  Respiratory failure.  Hypoxia. EXAM: PORTABLE CHEST 1 VIEW COMPARISON:  10/06/2019.  10/05/2019. FINDINGS: Endotracheal tube tip noted 4 cm above the carina. Feeding tube tip below left hemidiaphragm. Right IJ line tip at cavoatrial junction. Stable cardiomegaly. Persistent bilateral pulmonary infiltrates/edema left side greater right. Slight progression noted the left upper lung. Small 2 moderate left pleural effusion. No pneumothorax. IMPRESSION: 1. Endotracheal tube tip noted 4 cm above the  carina. Feeding tube and PICC line in stable position. 2.  Stable cardiomegaly. 3. Persistent bilateral pulmonary infiltrates/edema, left side greater right. Slight progression noted in the left upper lung. Small to moderate left pleural effusion. Electronically Signed   By: Marcello Moores  Register   On: 10/07/2019 06:08   DG CHEST PORT 1 VIEW  Result Date: 10/06/2019 CLINICAL DATA:  Respiratory failure, hypoxia EXAM:  PORTABLE CHEST 1 VIEW COMPARISON:  10/05/2019 FINDINGS: No significant change in rotated AP portable examination, support apparatus including endotracheal tube, partially imaged enteric feeding tube, and right neck vascular catheter. Cardiomegaly. Diffuse interstitial pulmonary opacity and atelectasis or consolidation of the left lung base, with layering bilateral pleural effusions. No new or focal airspace opacity. Unchanged cardiomegaly. IMPRESSION: No significant interval change in the appearance of the chest with diffuse interstitial pulmonary opacity, likely edema, atelectasis or consolidation of the left lung base, and layering pleural effusions. Electronically Signed   By: Eddie Candle M.D.   On: 10/06/2019 08:10   DG Chest Port 1 View  Result Date: 10/05/2019 CLINICAL DATA:  Central line placement EXAM: PORTABLE CHEST 1 VIEW COMPARISON:  Earlier same day FINDINGS: Patient is significantly rotated. Right IJ central line overlies region of cavoatrial junction. Endotracheal and enteric tubes are again present. Stable lung aeration. No pneumothorax. IMPRESSION: Right IJ central line tip overlies cavoatrial junction. No pneumothorax. Stable lung aeration. Electronically Signed   By: Macy Mis M.D.   On: 10/05/2019 16:30   ECHOCARDIOGRAM LIMITED  Result Date: 10/06/2019   ECHOCARDIOGRAM LIMITED REPORT   Patient Name:   JUDITHE KEETCH Date of Exam: 10/06/2019 Medical Rec #:  [id number]     Height:       68.0 in Accession #:    [id number]    Weight:       230.4 lb Date of Birth:  04-09-1946    BSA:          2.17 m Patient Age:    74 years      BP:           99/60 mmHg Patient Gender: F             HR:           64 bpm. Exam Location:  Inpatient  Procedure: Limited Echo, Cardiac Doppler, Color Doppler and Intracardiac            Opacification Agent Indications:    ; I50.40* Unspecified combined systolic (congestive) and                 diastolic (congestive) heart failure. Limited echo to rule out                  LV thrombus.  History:        Patient has prior history of Echocardiogram examinations, most                 recent 10/05/2019. Covid 19 positive. Cardiac shock. Pneumonia.  Sonographer:    Roseanna Rainbow RDCS Referring Phys: [id number] Darreld Mclean  Sonographer Comments: Patient is morbidly obese. IMPRESSIONS  1. Left ventricular ejection fraction, by visual estimation, is 30 to 35%. The left ventricle has moderately decreased function. Left ventricular septal wall thickness was moderately increased. Moderately increased left ventricular posterior wall thickness. There is moderately increased left ventricular wall thickness.  2. Moderate hypokinesis of the left ventricular, entire inferoseptal wall and inferior wall.  3. The left ventricle demonstrates regional wall motion abnormalities.  4.  Global right ventricle has normal systolc function.The right ventricular size is normal. no increase in right ventricular wall thickness.  5. The mitral valve is normal in structure. No evidence of mitral valve regurgitation. No evidence of mitral stenosis.  6. The tricuspid valve was normal in structure. Tricuspid valve regurgitation is trivial.  7. Tricuspid valve regurgitation is trivial.  8. No evidence of aortic valve sclerosis or stenosis.  9. The pulmonic valve was normal in structure. 10. The inferior vena cava is dilated in size with <50% respiratory variability, suggesting right atrial pressure of 15 mmHg. FINDINGS  Left Ventricle: Left ventricular ejection fraction, by visual estimation, is 30 to 35%. The left ventricle has moderately decreased function. Moderate hypokinesis of the left ventricular, entire inferoseptal wall and inferior wall. The left ventricle demonstrates regional wall motion abnormalities. Left ventricular septal wall thickness was moderately increased. Moderately increased left ventricular posterior wall thickness. There is moderately increased left ventricular wall thickness. Normal left  atrial pressure. Right Ventricle: The right ventricular size is normal. No increase in right ventricular wall thickness. Global RV systolic function is has normal systolic function. Left Atrium: Left atrial size was normal in size. Right Atrium: Right atrial size was normal in size. Right atrial pressure is estimated at 15 mmHg. Pericardium: There is no evidence of pericardial effusion is seen. There is no evidence of pericardial effusion. There is pleural effusion in the left lateral region. Mitral Valve: The mitral valve is normal in structure. No evidence of mitral valve stenosis by observation. No evidence of mitral valve regurgitation. Tricuspid Valve: The tricuspid valve is normal in structure. Tricuspid valve regurgitation is trivial. Aortic Valve: The aortic valve is tricuspid. . There is mild thickening and mild calcification of the aortic valve. Aortic valve regurgitation is not visualized. The aortic valve is structurally normal, with no evidence of sclerosis or stenosis. There is  mild thickening of the aortic valve. There is mild calcification of the aortic valve. Pulmonic Valve: The pulmonic valve was normal in structure. Pulmonic valve regurgitation is not visualized by color flow Doppler. Pulmonic regurgitation is not visualized by color flow Doppler. Aorta: The aortic root, ascending aorta and aortic arch are all structurally normal, with no evidence of dilitation or obstruction. Venous: The inferior vena cava is dilated in size with less than 50% respiratory variability, suggesting right atrial pressure of 15 mmHg. Shunts: There is no evidence of a patent foramen ovale. No ventricular septal defect is seen or detected. There is no evidence of an atrial septal defect. No atrial level shunt detected by color flow Doppler.  LEFT VENTRICLE          Normals PLAX 2D LVIDd:         5.04 cm  3.6 cm LVIDs:         3.80 cm  1.7 cm LV PW:         1.53 cm  1.4 cm LV IVS:        1.38 cm  1.3 cm LVOT diam:      2.20 cm  2.0 cm LV SV:         59 ml    79 ml LV SV Index:   25.67    45 ml/m2 LVOT Area:     3.80 cm 3.14 cm2  LV Volumes (MOD)             Normals LV area d, A2C:    32.30 cm LV area d, A4C:    32.50 cm  LV area s, A2C:    28.80 cm LV area s, A4C:    28.40 cm LV major d, A2C:   7.75 cm LV major d, A4C:   6.80 cm LV major s, A2C:   7.74 cm LV major s, A4C:   6.50 cm LV vol d, MOD A2C: 110.0 ml  68 ml LV vol d, MOD A4C: 126.0 ml LV vol s, MOD A2C: 89.5 ml   24 ml LV vol s, MOD A4C: 98.7 ml LV SV MOD A2C:     20.5 ml LV SV MOD A4C:     126.0 ml LV SV MOD BP:      23.4 ml   45 ml LEFT ATRIUM         Index LA diam:    5.40 cm 2.49 cm/m   AORTA                 Normals Ao Root diam: 2.80 cm 31 mm  SHUNTS Systemic Diam: 2.20 cm  Skeet Latch MD Electronically signed by Skeet Latch MD Signature Date/Time: 10/06/2019/4:37:24 PMThe mitral valve is normal in structure.    Final     Cardiac Studies   Echocardiogram Limited: 10/06/2019  1. Left ventricular ejection fraction, by visual estimation, is 30 to 35%. The left ventricle has moderately decreased function. Left ventricular septal wall thickness was moderately increased. Moderately increased left ventricular posterior wall  thickness. There is moderately increased left ventricular wall thickness.  2. Moderate hypokinesis of the left ventricular, entire inferoseptal wall and inferior wall.  3. The left ventricle demonstrates regional wall motion abnormalities.  4. Global right ventricle has normal systolc function.The right ventricular size is normal. no increase in right ventricular wall thickness.  5. The mitral valve is normal in structure. No evidence of mitral valve regurgitation. No evidence of mitral stenosis.  6. The tricuspid valve was normal in structure. Tricuspid valve regurgitation is trivial.  7. Tricuspid valve regurgitation is trivial.  8. No evidence of aortic valve sclerosis or stenosis.  9. The pulmonic valve was normal in  structure. 10. The inferior vena cava is dilated in size with <50% respiratory variability, suggesting right atrial pressure of 15 mmHg.  ECHO complete 10/05/2019: Impressions: 1. Left ventricular ejection fraction, by visual estimation, is 25 to 30%. The left ventricle has severely decreased function. There is no left ventricular hypertrophy. 2. Left ventricular diastolic parameters are indeterminate. 3. Moderately dilated left ventricular internal cavity size. 4. The left ventricle demonstrates global hypokinesis. 5. Global right ventricle has severely reduced systolic function.The right ventricular size is moderately enlarged. Right vetricular wall thickness was not assessed. 6. Left atrial size was severely dilated. 7. Right atrial size was moderately dilated. 8. Moderate pleural effusion in both left and right lateral regions. 9. Trivial pericardial effusion is present. 10. The mitral valve is normal in structure. Mild mitral valve regurgitation. 11. The tricuspid valve is normal in structure. 12. The tricuspid valve is normal in structure. Tricuspid valve regurgitation is trivial. 13. The aortic valve is tricuspid. Aortic valve regurgitation is not visualized. 14. The pulmonic valve was grossly normal. Pulmonic valve regurgitation is trivial. 15. TR signal is inadequate for assessing pulmonary artery systolic pressure. 16. The inferior vena cava is dilated in size with <50% respiratory variability, suggesting right atrial pressure of 15 mmHg. 17. Severely reduced LVEF, with evidence of biventricular failure and elevated right atrial pressure. 18. There is no ECG tracing on this study, but patient appears to be in  significant bradycardia throughout study. Based on imaging, may be flutter. Recommend clinical correlation.  Patient Profile     Debra Mcconnell is a 74 year old female with history of atrial flutter on Eliquis, diabetes, sleep apnea who was admitted to [facility] with acute hypoxic respiratory failure secondary to coronavirus pneumonia.  She has subsequently been found to have acute renal failure, significant thrombocytopenia, new onset biventricular failure, new onset left bundle branch block and concerns for cardiogenic/septic shock.  Assessment & Plan    1. New onset biventricular failure, concerns for coronavirus myocarditis versus stress-induced cardiomyopathy -I personally reviewed her echocardiogram it shows severely reduced left ventricular function, 20 to 25% with severely reduced RV function. I do not know what her ejection fraction was before. She also has a new onset left bundle branch block. BNP over 2000. I have a strong suspicion this represents coronavirus myocarditis. High-sensitivity troponin only mildly elevated around 300, CRP 5.2, ESR 16. Her BNP severely elevated. She does have new onset left bundle branch block. Overall, unclear if this is myocarditis versus a stress-induced cardiomyopathy.  Treatment will be supportive nonetheless. -Maps appear to be in the 70s on 0.03 of vasopressin.  There were issues with norepinephrine including nonsustained VT.  Apparently vasopressin a better agent for her. -Persistently is low diastolic blood pressure more consistent with sepsis.  Oxygen saturation on VBG from central line around 70, also consistent with sepsis.  We are unable to send a formal coox from [facility].  We will have to make do with this. -Would recommend to continue fentanyl Versed.  Would avoid propofol given biventricular failure. -Would begin to diurese per the critical care medicine team.  She remains on minimal vent settings so this does not appear to be a volume issue with oxygenation. -Her neurological status is concerning.  Overall she is critically ill with multiorgan system failure in the setting of COVID-19 pneumonia.  Recommend aggressive goals of care discussion. -When able, per critical care medicine, would begin  Lasix 40 mg IV twice daily -Would not start any beta-blocker or ACE inhibitor as she is critically ill currently.  We can address these once she is not critically ill.  Atrial flutter with 4-1 block -She clearly has advanced conduction disease.  I would clearly expect her heart rate to be much higher given her critically ill state.  For now we will hold any AV nodal agents.  She is auto rate controlled.   -No current indication for pacing as her MAP is in the 70-80 -We will continue to monitor this  Splenic Infarcts? -She was on Eliquis at home but the dosing is unclear per my discussion with the critical care medicine physician, Dr. Ruthann Cancer.  - now on Heparin, as she may have developed a clot on Eliquis versus developing a clot in her left ventricle.  On my review of her echocardiogram there is no definitive thrombus in the left ventricle. -It is a question if she has failed Eliquis. -For now she is on full dose anticoagulation with IV heparin -Limited echocardiogram with contrast does not mention LV thrombus, EF 30-35%  New Onset LBBB -Could represent myocarditis versus just development of cardiomyopathy.   -We will continue to monitor this.  For questions or updates, please contact [place] Please consult [email or web address] for contact info under     [employer]. Audie Box, [place]  [address] [place], [place] [address] [phone]  12:15 PM

## 2019-10-07 NOTE — Progress Notes (Signed)
[place] for Heparin IV Indication: atrial fibrillation, r/o LV thrombus, splenic infarct (PTA apixaban)  Allergies  Allergen Reactions  . Gabapentin Other (See Comments)    Dizziness and drowsiness  . Lisinopril Cough    Patient Measurements: Height: 5\' 8"  (172.7 cm) Weight: 230 lb 6.1 oz (104.5 kg) IBW/kg (Calculated) : 63.9 Heparin Dosing Weight: 83 kg  Vital Signs: Temp: 97.9 F (36.6 C) (01/29 1200) Temp Source: Oral (01/29 1200) BP: 129/80 (01/29 1400) Pulse Rate: 102 (01/29 1400)  Labs: Recent Labs    10/04/19 2107 10/04/19 2320 10/05/19 0458 10/05/19 1513 10/05/19 1622 10/05/19 1700 10/06/19 0400 10/06/19 0400 10/06/19 1329 10/06/19 1413 10/07/19 0230 10/07/19 1343  HGB   < >  --  12.0  --   --    < > 12.5   < > 12.2  --  11.6*  --   HCT   < >  --  38.1  --   --    < > 39.1  --  36.0  --  37.4  --   PLT   < >  --  105*  --   --   --  115*  --   --   --  106*  --   APTT  --  33  --   --   --    < > 83*   < >  --  173* 168* 115*  LABPROT  --  16.8*  --   --  18.2*  --   --   --   --   --   --   --   INR  --  1.4*  --   --  1.5*  --   --   --   --   --   --   --   HEPARINUNFRC  --   --   --   --   --    < > >2.20*  --   --  >2.20* 2.16*  --   CREATININE   < >  --  1.89*  --   --   --  1.74*  --   --   --  1.54*  --   TROPONINIHS  --   --   --  303*  --   --   --   --   --   --   --   --    < > = values in this interval not displayed.    Estimated Creatinine Clearance: 42.4 mL/min (A) (by C-G formula based on SCr of 1.54 mg/dL (H)).    Medications:  Infusions:  . sodium chloride 250 mL (10/05/19 1208)  . dextrose    . feeding supplement (VITAL AF 1.2 CAL) 1,000 mL (10/07/19 0509)  . fentaNYL infusion INTRAVENOUS 250 mcg/hr (10/07/19 1250)  . heparin 850 Units/hr (10/07/19 0509)  . norepinephrine (LEVOPHED) Adult infusion Stopped (10/05/19 1502)  . remdesivir 100 mg in NS 100 mL 100 mg (10/07/19 0953)  .  vasopressin (PITRESSIN) infusion - *FOR SHOCK* 0.03 Units/min (10/06/19 1843)    Assessment: 61 yoF admitted on 1/26 with COVID-19 pneumonia.  Pharmacy is consulted to transition from apixaban to heparin IV for Afib, possible apixaban failure with splenic infarcts, and r/o LV thrombus (echo w/ contrast pending).    Last apixaban 5 mg BID dose given on 1/27 at 0944  APTT 115, supratherapeutic but decreasing on heparin 850 unit/hr. Heparin level 2.16 with AM  labs  SCr decreased to 1.54 CBC: Hgb decreased to 11.6, Plt decreased to 106k (Baseline Plt 99 on admit) No bleeding or complications reported.  Goal of Therapy:  Heparin level 0.3-0.7 units/ml aPTT 66-102 seconds Monitor platelets by anticoagulation protocol: Yes   Plan:  Decrease to heparin IV infusion at 750 units/hr APTT 8 hours after rate change Daily aPTT, heparin level, and CBC   Gretta Arab PharmD, BCPS Clinical pharmacist phone 7am- 5pm: [phone] 10/07/2019 2:42 PM

## 2019-10-07 NOTE — Progress Notes (Signed)
[place] for Heparin IV Indication: atrial fibrillation, r/o LV thrombus, splenic infarct (PTA apixaban)  Allergies  Allergen Reactions  . Gabapentin Other (See Comments)    Dizziness and drowsiness  . Lisinopril Cough    Patient Measurements: Height: 5\' 8"  (172.7 cm) Weight: 230 lb 6.1 oz (104.5 kg) IBW/kg (Calculated) : 63.9 Heparin Dosing Weight: 83 kg  Vital Signs: BP: 134/63 (01/29 0200) Pulse Rate: 43 (01/29 0215)  Labs: Recent Labs    10/04/19 2107 10/04/19 2320 10/04/19 2320 10/05/19 0458 10/05/19 1513 10/05/19 1622 10/05/19 1700 10/05/19 1844 10/06/19 0400 10/06/19 0400 10/06/19 1329 10/06/19 1413 10/07/19 0230  HGB   < >  --   --  12.0  --   --   --    < > 12.5   < > 12.2  --  11.6*  HCT   < >  --   --  38.1  --   --   --    < > 39.1  --  36.0  --  37.4  PLT   < >  --   --  105*  --   --   --   --  115*  --   --   --  106*  APTT  --  33   < >  --   --   --  34   < > 83*  --   --  173* 168*  LABPROT  --  16.8*  --   --   --  18.2*  --   --   --   --   --   --   --   INR  --  1.4*  --   --   --  1.5*  --   --   --   --   --   --   --   HEPARINUNFRC  --   --   --   --   --   --  >2.20*  --  >2.20*  --   --  >2.20*  --   CREATININE   < >  --   --  1.89*  --   --   --   --  1.74*  --   --   --  1.54*  TROPONINIHS  --   --   --   --  303*  --   --   --   --   --   --   --   --    < > = values in this interval not displayed.    Estimated Creatinine Clearance: 42.4 mL/min (A) (by C-G formula based on SCr of 1.54 mg/dL (H)).    Medications:  Infusions:  . sodium chloride 250 mL (10/05/19 1208)  . dextrose    . feeding supplement (VITAL AF 1.2 CAL) 55 mL/hr at 10/06/19 1200  . fentaNYL infusion INTRAVENOUS 300 mcg/hr (10/06/19 2132)  . heparin    . norepinephrine (LEVOPHED) Adult infusion Stopped (10/05/19 1502)  . remdesivir 100 mg in NS 100 mL 100 mg (10/06/19 0954)  . vasopressin (PITRESSIN) infusion - *FOR SHOCK*  0.03 Units/min (10/06/19 1843)    Assessment: 74 yoF admitted on 1/26 with COVID-19 pneumonia.  Pharmacy is consulted to transition from apixaban to heparin IV for Afib, possible apixaban failure with splenic infarcts, and r/o LV thrombus (echo w/ contrast pending).   Last apixaban 5 mg BID dose given on 1/27 at [place]  10/07/19 0400 aPTT: 168 seconds-->remains supra-therapeutic despite rate decrease (verbally reported by lab) Heparin level >1.94 IU/mL (verbally reported by lab-->needs further dilution) RN  confirms no bleeding or infusion site issues at this time H/H: 11.6/37.4     Plates:115> 106 (Baseline  99K)  Goal of Therapy:  Heparin level 0.3-0.7 units/ml aPTT 66-102 seconds Monitor platelets by anticoagulation protocol: Yes   Plan:  HOLD heparin x1 hour (from 0400 to 0500) Re-start heparin  Infusion rate at 850 units/hr  at 0500 Re-check aPTT  and HL ~8 hours after rate change Daily aPTT, heparin level, and CBC    Despina Pole, Pharm. D. Clinical Pharmacist 10/07/2019 4:09 AM

## 2019-10-08 ENCOUNTER — Inpatient Hospital Stay (HOSPITAL_COMMUNITY): Payer: Medicare Other

## 2019-10-08 DIAGNOSIS — J1282 Pneumonia due to coronavirus disease 2019: Secondary | ICD-10-CM

## 2019-10-08 LAB — GLUCOSE, CAPILLARY
Glucose-Capillary: 210 mg/dL — ABNORMAL HIGH (ref 70–99)
Glucose-Capillary: 173 mg/dL — ABNORMAL HIGH (ref 70–99)
Glucose-Capillary: 199 mg/dL — ABNORMAL HIGH (ref 70–99)
Glucose-Capillary: 156 mg/dL — ABNORMAL HIGH (ref 70–99)

## 2019-10-08 LAB — APTT: aPTT: 71 seconds — ABNORMAL HIGH (ref 24–36)

## 2019-10-08 LAB — CBC
HCT: 38.1 % (ref 36.0–46.0)
Hemoglobin: 12.1 g/dL (ref 12.0–15.0)
MCH: 32.5 pg (ref 26.0–34.0)
MCHC: 31.8 g/dL (ref 30.0–36.0)
MCV: 102.4 fL — ABNORMAL HIGH (ref 80.0–100.0)
Platelets: 114 10*3/uL — ABNORMAL LOW (ref 150–400)
RBC: 3.72 MIL/uL — ABNORMAL LOW (ref 3.87–5.11)
RDW: 16.4 % — ABNORMAL HIGH (ref 11.5–15.5)
WBC: 8 10*3/uL (ref 4.0–10.5)
nRBC: 0.3 % — ABNORMAL HIGH (ref 0.0–0.2)

## 2019-10-08 LAB — BASIC METABOLIC PANEL
Anion gap: 10 (ref 5–15)
BUN: 85 mg/dL — ABNORMAL HIGH (ref 8–23)
CO2: 24 mmol/L (ref 22–32)
Calcium: 8.3 mg/dL — ABNORMAL LOW (ref 8.9–10.3)
Chloride: 111 mmol/L (ref 98–111)
Creatinine, Ser: 1.27 mg/dL — ABNORMAL HIGH (ref 0.44–1.00)
GFR calc Af Amer: 49 mL/min — ABNORMAL LOW (ref >60)
GFR calc non Af Amer: 42 mL/min — ABNORMAL LOW (ref >60)
Glucose, Bld: 210 mg/dL — ABNORMAL HIGH (ref 70–99)
Potassium: 4.2 mmol/L (ref 3.5–5.1)
Sodium: 145 mmol/L (ref 135–145)

## 2019-10-08 LAB — HEPARIN LEVEL (UNFRACTIONATED): Heparin Unfractionated: 0.9 IU/mL — ABNORMAL HIGH (ref 0.30–0.70)

## 2019-10-08 MED ORDER — FUROSEMIDE 10 MG/ML IJ SOLN
40.0000 mg | Freq: Once | INTRAMUSCULAR | Status: AC
  Administered 2019-10-08: 09:00:00 40 mg via INTRAVENOUS
  Filled 2019-10-08: qty 4

## 2019-10-08 MED ORDER — SODIUM CHLORIDE 0.9% FLUSH: 10.0000 mL | INTRAVENOUS | Status: DC | PRN

## 2019-10-08 MED ORDER — SODIUM CHLORIDE 0.9% FLUSH
10.0000 mL | Freq: Two times a day (BID) | INTRAVENOUS | Status: DC
  Administered 2019-10-08 – 2019-10-20 (×22): 10 mL

## 2019-10-08 MED ORDER — INSULIN DETEMIR 100 UNIT/ML ~~LOC~~ SOLN
20.0000 [IU] | Freq: Two times a day (BID) | SUBCUTANEOUS | Status: DC
  Administered 2019-10-08 – 2019-10-10 (×4): 20 [IU] via SUBCUTANEOUS
  Filled 2019-10-08 (×5): qty 0.2

## 2019-10-08 NOTE — Progress Notes (Signed)
PS/CPAP of 10/5 attempted.  Pt went apneic.  Pt back on full vent support.  Will attempt again later.

## 2019-10-08 NOTE — Progress Notes (Signed)
[place] for Heparin IV Indication: atrial fibrillation, r/o LV thrombus, splenic infarct (PTA apixaban)  Allergies  Allergen Reactions  . Gabapentin Other (See Comments)    Dizziness and drowsiness  . Lisinopril Cough    Patient Measurements: Height: 5\' 8"  (172.7 cm) Weight: 233 lb 4 oz (105.8 kg) IBW/kg (Calculated) : 63.9 Heparin Dosing Weight: 83 kg  Vital Signs: Temp: 98.3 F (36.8 C) (01/30 0400) Temp Source: Axillary (01/30 0400) BP: 118/58 (01/29 2100) Pulse Rate: 75 (01/30 0600)  Labs: Recent Labs    10/05/19 1513 10/05/19 1622 10/05/19 1700 10/06/19 0400 10/06/19 0400 10/06/19 1329 10/06/19 1329 10/06/19 1413 10/07/19 0230 10/07/19 0230 10/07/19 1343 10/07/19 2224 10/08/19 0513  HGB  --   --    < > 12.5   < > 12.2   < >  --  11.6*  --   --   --  12.1  HCT  --   --    < > 39.1   < > 36.0  --   --  37.4  --   --   --  38.1  PLT  --   --   --  115*  --   --   --   --  106*  --   --   --  114*  APTT  --   --    < > 83*   < >  --    < > 173* 168*   < > 115* 74* 71*  LABPROT  --  18.2*  --   --   --   --   --   --   --   --   --   --   --   INR  --  1.5*  --   --   --   --   --   --   --   --   --   --   --   HEPARINUNFRC  --   --    < > >2.20*  --   --   --  >2.20* 2.16*  --   --   --   --   CREATININE  --   --   --  1.74*  --   --   --   --  1.54*  --   --   --  1.27*  TROPONINIHS 303*  --   --   --   --   --   --   --   --   --   --   --   --    < > = values in this interval not displayed.    Estimated Creatinine Clearance: 51.8 mL/min (A) (by C-G formula based on SCr of 1.27 mg/dL (H)).    Medications:  Infusions:  . sodium chloride 250 mL (10/05/19 1208)  . dextrose    . feeding supplement (VITAL AF 1.2 CAL) 1,000 mL (10/07/19 0509)  . fentaNYL infusion INTRAVENOUS 175 mcg/hr (10/08/19 0600)  . heparin 750 Units/hr (10/08/19 0600)  . norepinephrine (LEVOPHED) Adult infusion Stopped (10/05/19 1502)  .  remdesivir 100 mg in NS 100 mL 100 mg (10/07/19 0953)  . vasopressin (PITRESSIN) infusion - *FOR SHOCK* Stopped (10/08/19 [id number])    Assessment: 75 yoF admitted on 1/26 with COVID-19 pneumonia.  Pharmacy is consulted to transition from apixaban to heparin IV for Afib, possible apixaban failure with splenic infarcts, and r/o LV thrombus (echo w/ contrast  pending).    Last apixaban 5 mg BID dose given on 1/27 at 0944  APTT 71, therapeutic on heparin 750 unit/hr. Heparin level 0.9, decreasing, but remains falsely elevated and not correlating with aPTT.  Continue to use aPTT for heparin titration.  SCr decreased to 1.27 CBC: Hgb stable at 12.1, Plt low/stable at 114 (Baseline Plt 99 on admit) No bleeding or complications reported.   Goal of Therapy:  Heparin level 0.3-0.7 units/ml aPTT 66-102 seconds Monitor platelets by anticoagulation protocol: Yes   Plan:  Continue heparin IV infusion at 750 units/hr Daily aPTT, heparin level, and CBC   Gretta Arab PharmD, BCPS Clinical pharmacist phone 7am- 5pm: [phone] 10/08/2019 7:12 AM

## 2019-10-08 NOTE — Progress Notes (Signed)
Chart reviewed  some progress with weaning pressors and improved renal function.  The patient remains very ill.  Medical therapy for myopathy has been limited by hypotension.  Plan as per Dr Marisue Ivan.  Start lasix 40mg  IV BID when able.  Cardiology to be available as needed this weekend. Please call me with any questions/ concerns.  Thompson Grayer MD, [facility] 10/08/2019 8:06 AM

## 2019-10-08 NOTE — Progress Notes (Signed)
NAME:  Debra Mcconnell, MRN:  [id number], DOB:  [birth year], LOS: 4 ADMISSION DATE:  10/04/2019, CONSULTATION DATE:  10/04/19 REFERRING MD:  [facility], CHIEF COMPLAINT:  SOB   Brief History   74 y/o F who presented to [facility] on 1/26 with reports of altered mental status.  Found to be hypoxic and COVID positive with AKI, hyperglycemia.  CTA chest negative for PE. CT head negative. Had worsening respiratory failure requiring intubation.  Patient transferred to [facility] for further care. Course notable for BiVentricular failure with concern for COVID myocarditis.   Past Medical History  DM II  CVA  HTN HLD A-Flutter - CHADS2Vasc score 5 Hypothyroidism  OSA   Significant Hospital Events   1/26 Admit to [facility] from [facility], intubated / COVID + 1/28 On Vaso, SvO2 72% 1/29 on vasopressin  Consults:     Procedures:  ETT 1/26 >> R IJ TLC 1/27 >>   Significant Diagnostic Tests:  CT Head (OSH) 1/26 >> neg CT C-Spine (OSH) 1/26 >>  CTA Chest 1/26 >> negative for PE, +pneumonia ECHO 1/27 >> LVEF 25-30%, global LV hypokinesis, LA severely dilated, RA moderately dilated, evidence of biventricular failure with elevated RA pressure  Micro Data:  COVID 1/26 >> positive Tracheal aspirate 1/26 >> ng BCx2 1/26 >> ng MRSA PCR 1/27 >> negative UC 1/27 >> negative   Antimicrobials:  Remdesivir 1/26 >>1/30 Decadron 1/26 >>   Interim history/subjective:   Critically ill, intubated Off pressors On 30%/PEEP of 5  Objective   Blood pressure (!) 118/58, pulse 74, temperature 98.3 F (36.8 C), temperature source Axillary, resp. rate (!) 24, height 5\' 8"  (1.727 m), weight 105.8 kg, SpO2 99 %.    Vent Mode: PRVC FiO2 (%):  [30 %-40 %] 30 % Set Rate:  [24 bmp] 24 bmp Vt Set:  [440 mL] 440 mL PEEP:  [5 cmH20] 5 cmH20 Plateau Pressure:  [16 cmH20-18 cmH20] 18 cmH20   Intake/Output Summary (Last 24 hours) at 10/08/2019 1519 Last data filed at 10/08/2019 1500 Gross per 24  hour  Intake 2872.54 ml  Output 3230 ml  Net -357.46 ml   Filed Weights   10/05/19 1730 10/06/19 0600 10/08/19 0500  Weight: 104.5 kg 104.5 kg 105.8 kg    Examination: General: obese woman, intubated HEENT: ett in place, no pallor, no icterus Neuro: Opens eyes, grips on left, weak on right?  Old hemiplegia CV: distant, no M PULM: bilateral dilated breath sounds GI: very hypoactive BS Extremities: warm/dry, no pretibial edema Skin: no rashes or lesions  Chest x-ray 1/30 personally reviewed which shows bilateral stable infiltrates left more than right  Labs show decreased creatinine, normal electrolytes, no leukocytosis and stable thrombocytopenia  Resolved Hospital Problem list   Hx MRSA Foot Wound Infection - positive on 05/13/2019. Healed, no acute interventions during admit  Lactic Acidosis, cleared Assessment & Plan:   ARDS secondary to COVID PNA  Mechanical ventilation via ARDS protocol, target PRVC 6-8  cc/kg Spontaneous breathing trials with goal extubation once well diuresed Goal plateau pressure less than 30, driving pressure less than 15 No indication paralytics or prone positioning at this time VAP prevention order set Dexamethasone, plan 10 days Remdesivir, plan 5 days  Sedation Needs in setting of Mechanical Ventilation  PAD protocol, fentanyl gtt with goal RASS 0 DC Versed drip and use intermittent Dc oral  clonazepam, oxycodone Ct Bupropion  AKI   Follow BMP, urine output Replace electrolytes as indicated Lasix 40 daily  Thrombocytopenia  Following CBC, following for any evidence of bleeding  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 Follows with Dr. Bettina Gavia.  New LBBB on admit, BNP >2k.  Suspect element of congestion with elevated LFT's Appreciate cardiology management, evaluation. Will need follow-up echo eventually  Hx HTN, HLD, CVA Hx AFlutter, 4:1  Conduction New LBBB Heparin infusion Home carvedilol, amlodipine, olmesartan, hydralazine are all on hold Home Lipitor on hold  Splenic Infarcts?  On eliquis at baseline. 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  Doubt failure of Eliquis Continue heparin per pharmacy for now  Hx OSA  Will require CPAP postextubation  DM II with Peripheral Neuropathy  Hgb A1c 7  Sliding-scale insulin as per protocol Tube feed coverage 5 units every 4 hours Increase Levemir 20 units twice daily Gabapentin as ordered Home Amaryl on hold   Anxiety / Depression  Home Wellbutrin Ambien held  Goals of care is clarified on 1/29  Best practice:  Diet: NPO, TF ordered Pain/Anxiety/Delirium protocol (if indicated): Ordered VAP protocol (if indicated): Ordered DVT prophylaxis: heparin gtt GI prophylaxis: Pepcid Glucose control: SSI Mobility: BR   Code Status: DNR in case of arrest 1/29, no reintubation Family Communication: Neighbor [name] [place] ([place])  1/29.    Disposition: ICU   The patient is critically ill with multiple organ systems failure and requires high complexity decision making for assessment and support, frequent evaluation and titration of therapies, application of advanced monitoring technologies and extensive interpretation of multiple databases. Critical Care Time devoted to patient care services described in this note independent of APP/resident  time is 35 minutes.   Kara Mead MD. [place]. [facility]  If no response to pager , please call [phone]   10/08/2019

## 2019-10-08 NOTE — Progress Notes (Signed)
Pt lavaged & sxn'd with 5cc normal saline bullet

## 2019-10-09 ENCOUNTER — Inpatient Hospital Stay (HOSPITAL_COMMUNITY): Payer: Medicare Other

## 2019-10-09 DIAGNOSIS — N179 Acute kidney failure, unspecified: Secondary | ICD-10-CM

## 2019-10-09 LAB — BASIC METABOLIC PANEL
Anion gap: 9 (ref 5–15)
BUN: 94 mg/dL — ABNORMAL HIGH (ref 8–23)
CO2: 28 mmol/L (ref 22–32)
Calcium: 8.2 mg/dL — ABNORMAL LOW (ref 8.9–10.3)
Chloride: 110 mmol/L (ref 98–111)
Creatinine, Ser: 1.37 mg/dL — ABNORMAL HIGH (ref 0.44–1.00)
GFR calc Af Amer: 45 mL/min — ABNORMAL LOW (ref >60)
GFR calc non Af Amer: 39 mL/min — ABNORMAL LOW (ref >60)
Glucose, Bld: 159 mg/dL — ABNORMAL HIGH (ref 70–99)
Potassium: 4.3 mmol/L (ref 3.5–5.1)
Sodium: 147 mmol/L — ABNORMAL HIGH (ref 135–145)

## 2019-10-09 LAB — CBC
HCT: 38.7 % (ref 36.0–46.0)
Hemoglobin: 12.1 g/dL (ref 12.0–15.0)
MCH: 32.1 pg (ref 26.0–34.0)
MCHC: 31.3 g/dL (ref 30.0–36.0)
MCV: 102.7 fL — ABNORMAL HIGH (ref 80.0–100.0)
Platelets: 117 10*3/uL — ABNORMAL LOW (ref 150–400)
RBC: 3.77 MIL/uL — ABNORMAL LOW (ref 3.87–5.11)
RDW: 16.7 % — ABNORMAL HIGH (ref 11.5–15.5)
WBC: 6.8 10*3/uL (ref 4.0–10.5)
nRBC: 0 % (ref 0.0–0.2)

## 2019-10-09 LAB — HEPARIN LEVEL (UNFRACTIONATED)
Heparin Unfractionated: 0.74 IU/mL — ABNORMAL HIGH (ref 0.30–0.70)
Heparin Unfractionated: 0.77 IU/mL — ABNORMAL HIGH (ref 0.30–0.70)

## 2019-10-09 LAB — APTT
aPTT: 55 seconds — ABNORMAL HIGH (ref 24–36)
aPTT: 56 seconds — ABNORMAL HIGH (ref 24–36)

## 2019-10-09 LAB — GLUCOSE, CAPILLARY
Glucose-Capillary: 149 mg/dL — ABNORMAL HIGH (ref 70–99)
Glucose-Capillary: 182 mg/dL — ABNORMAL HIGH (ref 70–99)
Glucose-Capillary: 115 mg/dL — ABNORMAL HIGH (ref 70–99)
Glucose-Capillary: 111 mg/dL — ABNORMAL HIGH (ref 70–99)
Glucose-Capillary: 150 mg/dL — ABNORMAL HIGH (ref 70–99)
Glucose-Capillary: 154 mg/dL — ABNORMAL HIGH (ref 70–99)

## 2019-10-09 LAB — CULTURE, BLOOD (ROUTINE X 2)
Culture: NO GROWTH
Culture: NO GROWTH
Special Requests: ADEQUATE
Special Requests: ADEQUATE

## 2019-10-09 LAB — MAGNESIUM: Magnesium: 2.6 mg/dL — ABNORMAL HIGH (ref 1.7–2.4)

## 2019-10-09 LAB — CULTURE, RESPIRATORY W GRAM STAIN: Culture: NO GROWTH

## 2019-10-09 LAB — PHOSPHORUS: Phosphorus: 4 mg/dL (ref 2.5–4.6)

## 2019-10-09 MED ORDER — HEPARIN BOLUS VIA INFUSION
1000.0000 [IU] | Freq: Once | INTRAVENOUS | Status: AC
  Administered 2019-10-09: 17:00:00 1000 [IU] via INTRAVENOUS
  Filled 2019-10-09: qty 1000

## 2019-10-09 MED ORDER — HEPARIN (PORCINE) 25000 UT/250ML-% IV SOLN
850.0000 [IU]/h | INTRAVENOUS | Status: DC
  Administered 2019-10-09: 850 [IU]/h via INTRAVENOUS

## 2019-10-09 MED ORDER — FUROSEMIDE 10 MG/ML IJ SOLN
40.0000 mg | Freq: Once | INTRAMUSCULAR | Status: AC
  Administered 2019-10-09: 11:00:00 40 mg via INTRAVENOUS
  Filled 2019-10-09: qty 4

## 2019-10-09 MED ORDER — HYDRALAZINE HCL 25 MG PO TABS
50.0000 mg | ORAL_TABLET | Freq: Three times a day (TID) | ORAL | Status: DC
  Administered 2019-10-09 – 2019-10-20 (×29): 50 mg via ORAL
  Filled 2019-10-09 (×33): qty 2

## 2019-10-09 MED ORDER — CARVEDILOL 3.125 MG PO TABS
3.1250 mg | ORAL_TABLET | Freq: Two times a day (BID) | ORAL | Status: DC
  Administered 2019-10-09 – 2019-10-20 (×23): 3.125 mg via ORAL
  Filled 2019-10-09 (×25): qty 1

## 2019-10-09 MED ORDER — HEPARIN (PORCINE) 25000 UT/250ML-% IV SOLN
800.0000 [IU]/h | INTRAVENOUS | Status: DC
  Administered 2019-10-10: 09:00:00 1000 [IU]/h via INTRAVENOUS
  Administered 2019-10-11: 900 [IU]/h via INTRAVENOUS
  Filled 2019-10-09 (×2): qty 250

## 2019-10-09 NOTE — Progress Notes (Signed)
[place] for Heparin IV Indication: atrial fibrillation, r/o LV thrombus, splenic infarct (PTA apixaban)  Allergies  Allergen Reactions  . Gabapentin Other (See Comments)    Dizziness and drowsiness  . Lisinopril Cough    Patient Measurements: Height: 5\' 8"  (172.7 cm) Weight: 233 lb 4 oz (105.8 kg) IBW/kg (Calculated) : 63.9 Heparin Dosing Weight: 83 kg  Vital Signs: Temp: 99.7 F (37.6 C) (01/31 0000) Temp Source: Axillary (01/31 0000) BP: 118/65 (01/31 0500) Pulse Rate: 68 (01/31 0500)  Labs: Recent Labs    10/07/19 0230 10/07/19 0230 10/07/19 1343 10/07/19 2224 10/08/19 0513 10/09/19 0354  HGB 11.6*   < >  --   --  12.1 12.1  HCT 37.4  --   --   --  38.1 38.7  PLT 106*  --   --   --  114* 117*  APTT 168*  --    < > 74* 71* 55*  HEPARINUNFRC 2.16*  --   --   --  0.90* 0.74*  CREATININE 1.54*  --   --   --  1.27* 1.37*   < > = values in this interval not displayed.    Estimated Creatinine Clearance: 48 mL/min (A) (by C-G formula based on SCr of 1.37 mg/dL (H)).    Medications:  Infusions:  . sodium chloride 250 mL (10/05/19 1208)  . dextrose    . feeding supplement (VITAL AF 1.2 CAL) 55 mL/hr at 10/09/19 0500  . fentaNYL infusion INTRAVENOUS 25 mcg/hr (10/09/19 0500)  . heparin 750 Units/hr (10/09/19 0500)  . vasopressin (PITRESSIN) infusion - *FOR SHOCK* Stopped (10/08/19 [id number])    Assessment: 59 yoF admitted on 1/26 with COVID-19 pneumonia.  Pharmacy is consulted to transition from apixaban to heparin IV for Afib, possible apixaban failure with splenic infarcts, and r/o LV thrombus (echo w/ contrast pending).    Last apixaban 5 mg BID dose given on 1/27 at 0944  APTT 55, decreased to slightly sub-therapeutic on heparin 750 unit/hr. Heparin level 0.74, supra-therapeutic but decreasing.  Remains falsely elevated and not yet correlating with aPTT.  Continue to use aPTT for heparin titration.  SCr increased to 1.37   CBC: Hgb stable at 12.1, Plt low/stable at 117 (Baseline Plt 99 on admit) No bleeding or complications reported.   Goal of Therapy:  Heparin level 0.3-0.7 units/ml aPTT 66-102 seconds Monitor platelets by anticoagulation protocol: Yes   Plan:  Increase to heparin IV infusion at 850 units/hr APTT in 8 hours after rate change. Daily aPTT, heparin level, and CBC   Gretta Arab PharmD, BCPS Clinical pharmacist phone 7am- 5pm: [phone] 10/09/2019 7:13 AM

## 2019-10-09 NOTE — Progress Notes (Signed)
NAME:  Debra Mcconnell, MRN:  [id number], DOB:  10/11/1945, LOS: 5 ADMISSION DATE:  10/04/2019, CONSULTATION DATE:  10/04/19 REFERRING MD:  [facility], CHIEF COMPLAINT:  SOB   Brief History   74 y/o F who presented to [facility] on 1/26 with reports of altered mental status.  Found to be hypoxic and COVID positive with AKI, hyperglycemia.  CTA chest negative for PE. CT head negative. Had worsening respiratory failure requiring intubation.  Patient transferred to [facility] for further care. Course notable for BiVentricular failure with concern for COVID myocarditis.   Past Medical History  DM II  CVA  HTN HLD A-Flutter - CHADS2Vasc score 5 Hypothyroidism  OSA   Significant Hospital Events   1/26 Admit to [facility] from [facility], intubated / COVID + 1/28 On Vaso, SvO2 72% 1/29 on vasopressin 1/30 Off pressors  Consults:     Procedures:  ETT 1/26 >> R IJ TLC 1/27 >>   Significant Diagnostic Tests:  CT Head (OSH) 1/26 >> neg CT C-Spine (OSH) 1/26 >>  CTA Chest 1/26 >> negative for PE, +pneumonia ECHO 1/27 >> LVEF 25-30%, global LV hypokinesis, LA severely dilated, RA moderately dilated, evidence of biventricular failure with elevated RA pressure  Micro Data:  COVID 1/26 >> positive Tracheal aspirate 1/26 >> ng BCx2 1/26 >> ng MRSA PCR 1/27 >> negative UC 1/27 >> negative   Antimicrobials:  Remdesivir 1/26 >>1/30 Decadron 1/26 >>   Interim history/subjective:   Diuresed well with Lasix. Critically ill, intubated Off fentanyl drip    Objective   Blood pressure 123/80, pulse 82, temperature 99 F (37.2 C), temperature source Oral, resp. rate 16, height 5\' 8"  (1.727 m), weight 105.8 kg, SpO2 100 %.    Vent Mode: PSV;CPAP FiO2 (%):  [30 %] 30 % Set Rate:  [24 bmp] 24 bmp Vt Set:  [440 mL] 440 mL PEEP:  [5 cmH20] 5 cmH20 Pressure Support:  [5 cmH20-8 cmH20] 5 cmH20 Plateau Pressure:  [16 cmH20-18 cmH20] 17 cmH20   Intake/Output Summary (Last 24 hours)  at 10/09/2019 1015 Last data filed at 10/09/2019 0730 Gross per 24 hour  Intake 1254.09 ml  Output 2275 ml  Net -1020.91 ml   Filed Weights   10/05/19 1730 10/06/19 0600 10/08/19 0500  Weight: 104.5 kg 104.5 kg 105.8 kg    Examination: General: obese elderly woman, intubated HEENT: Oral ett in place, no pallor, no icterus Neuro: Lethargic, opens eyes, grips on left, weak on right?  Old hemiplegia CV: distant, S1-S2 irregular, no murmur PULM: bilateral ventilated breath sounds GI: very hypoactive BS Extremities: warm/dry, 1+ pretibial edema Skin: no rashes or lesions  Chest x-ray 1/31 personally reviewed which shows slight interval improvement in diffuse bilateral infiltrates  Labs show slight increase in BUN and creatinine and sodium, no leukocytosis, stable thrombocytopenia  Resolved Hospital Problem list   Hx MRSA Foot Wound Infection - positive on 05/13/2019. Healed, no acute interventions during admit  Lactic Acidosis, cleared Assessment & Plan:   ARDS secondary to COVID PNA  Mechanical ventilation via ARDS protocol, target PRVC 6-8  cc/kg Spontaneous breathing trials >> getting close to extubation Goal plateau pressure less than 30, driving pressure less than 15 No indication paralytics or prone positioning at this time VAP prevention order set Dexamethasone x 10 days Remdesivir completed  Sedation Needs in setting of Mechanical Ventilation  PAD protocol, use intermittent fentanyl with goal RASS 0 Ct Bupropion  AKI -slight increase in BUN/creatinine/sodium with Lasix 1/30 suggesting that  she is intravascularly dry  Follow BMP, urine output Replace electrolytes as indicated Lasix 40 daily to facilitate extubation   Thrombocytopenia  Following CBC, following for any evidence of bleeding  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 Bettina Gavia.  New LBBB on  admit, BNP >2k.  Suspect element of congestion with elevated LFT's Appreciate cardiology management, evaluation. Doubt she will tolerate ARB, will start hydralazine and then Coreg if blood pressure tolerates .Home  amlodipine, olmesartan are  on hold Will need follow-up echo eventually   Hx AFlutter, 4:1 Conduction Splenic Infarcts?  On eliquis PTA. 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  Doubt failure of Eliquis Continue heparin per pharmacy for now  Hx OSA  Will require CPAP postextubation  DM II with Peripheral Neuropathy  Hgb A1c 7  Sliding-scale insulin as per protocol Tube feed coverage 5 units every 4 hours Increase Levemir 20 units twice daily Ct home Gabapentin  Home Amaryl on hold   Anxiety / Depression  Home Wellbutrin Ambien held  Goals of care -no reintubation as per discussion on 1/29, reclarified with [facility] POA on 1/31  Best practice:  Diet: NPO, TF ordered Pain/Anxiety/Delirium protocol (if indicated): Ordered VAP protocol (if indicated): Ordered DVT prophylaxis: heparin gtt GI prophylaxis: Pepcid Glucose control: SSI Mobility: BR   Code Status: DNR in case of arrest 1/29, no reintubation Family Communication: Neighbor [name] [place] ([facility])  1/31   Disposition: ICU    Kara Mead MD. FCCP. [place] [facility]  If no response to pager , please call [phone]   10/09/2019

## 2019-10-09 NOTE — Progress Notes (Signed)
[place] for Heparin IV Indication: atrial fibrillation, r/o LV thrombus, splenic infarct (PTA apixaban)  Allergies  Allergen Reactions  . Gabapentin Other (See Comments)    Dizziness and drowsiness  . Lisinopril Cough    Patient Measurements: Height: 5\' 8"  (172.7 cm) Weight: 233 lb 4 oz (105.8 kg) IBW/kg (Calculated) : 63.9 Heparin Dosing Weight: 83 kg  Vital Signs: Temp: 99.7 F (37.6 C) (01/31 0000) Temp Source: Axillary (01/31 0000) BP: 118/65 (01/31 0500) Pulse Rate: 68 (01/31 0500)  Labs: Recent Labs    10/07/19 0230 10/07/19 0230 10/07/19 1343 10/07/19 2224 10/08/19 0513 10/09/19 0354  HGB 11.6*   < >  --   --  12.1 12.1  HCT 37.4  --   --   --  38.1 38.7  PLT 106*  --   --   --  114* 117*  APTT 168*  --    < > 74* 71* 55*  HEPARINUNFRC 2.16*  --   --   --  0.90* 0.74*  CREATININE 1.54*  --   --   --  1.27* 1.37*   < > = values in this interval not displayed.    Estimated Creatinine Clearance: 48 mL/min (A) (by C-G formula based on SCr of 1.37 mg/dL (H)).    Medications:  Infusions:  . sodium chloride 250 mL (10/05/19 1208)  . dextrose    . feeding supplement (VITAL AF 1.2 CAL) 55 mL/hr at 10/09/19 0500  . fentaNYL infusion INTRAVENOUS 25 mcg/hr (10/09/19 0500)  . heparin 750 Units/hr (10/09/19 0500)  . vasopressin (PITRESSIN) infusion - *FOR SHOCK* Stopped (10/08/19 [id number])    Assessment: 81 yoF admitted on 1/26 with COVID-19 pneumonia.  Pharmacy is consulted to transition from apixaban to heparin IV for Afib, possible apixaban failure with splenic infarcts, and r/o LV thrombus (echo w/ contrast pending).    Last apixaban 5 mg BID dose given on 1/27 at 0944  10/09/19 0545:  aPTT 55 seconds-->slightly sub- therapeutic on heparin 750 unit/hr. Heparin level: 0.74-->finally correlating with aPTT-->will use HL for titration from now on  SCr increased to 1.37 CBC: Hgb still 12.1, Plt low/stable at 117 (Baseline Plt  99 on admit) No bleeding or complications reported.   Goal of Therapy:  Heparin level 0.3-0.7 units/ml aPTT 66-102 seconds Monitor platelets by anticoagulation protocol: Yes   Plan:  Continue heparin IV infusion at 750 units/hr Daily  heparin level, and CBC Monitor for signs and symptoms of bleeding  Despina Pole, Pharm. D. Clinical Pharmacist 10/09/2019 5:52 AM

## 2019-10-09 NOTE — Progress Notes (Signed)
[place] for Heparin IV Indication: atrial fibrillation, r/o LV thrombus, splenic infarct (PTA apixaban)  Allergies  Allergen Reactions  . Gabapentin Other (See Comments)    Dizziness and drowsiness  . Lisinopril Cough    Patient Measurements: Height: 5\' 8"  (172.7 cm) Weight: 233 lb 4 oz (105.8 kg) IBW/kg (Calculated) : 63.9 Heparin Dosing Weight: 83 kg  Vital Signs: Temp: 98 F (36.7 C) (01/31 1600) Temp Source: Oral (01/31 1200) BP: 111/69 (01/31 1400) Pulse Rate: 69 (01/31 1400)  Labs: Recent Labs    10/07/19 0230 10/07/19 1343 10/08/19 0513 10/09/19 0354 10/09/19 1530  HGB 11.6*  --  12.1 12.1  --   HCT 37.4  --  38.1 38.7  --   PLT 106*  --  114* 117*  --   APTT 168*   < > 71* 55* 56*  HEPARINUNFRC 2.16*  --  0.90* 0.74* 0.77*  CREATININE 1.54*  --  1.27* 1.37*  --    < > = values in this interval not displayed.    Estimated Creatinine Clearance: 48 mL/min (A) (by C-G formula based on SCr of 1.37 mg/dL (H)).    Medications:  Infusions:  . sodium chloride 250 mL (10/05/19 1208)  . dextrose    . feeding supplement (VITAL AF 1.2 CAL) 1,000 mL (10/09/19 1624)  . heparin 850 Units/hr (10/09/19 1608)    Assessment: 70 yoF admitted on 1/26 with COVID-19 pneumonia.  Pharmacy is consulted to transition from apixaban to heparin IV for Afib, possible apixaban failure with splenic infarcts, and r/o LV thrombus (echo w/ contrast pending).    Last apixaban 5 mg BID dose given on 1/27 at 0944  APTT 55, decreased to slightly sub-therapeutic on heparin 750 unit/hr. Heparin level 0.74, supra-therapeutic but decreasing.  Remains falsely elevated and not yet correlating with aPTT.  Continue to use aPTT for heparin titration.  SCr increased to 1.37  CBC: Hgb stable at 12.1, Plt low/stable at 117 (Baseline Plt 99 on admit) No bleeding or complications reported.   Goal of Therapy:  Heparin level 0.3-0.7 units/ml aPTT 66-102  seconds Monitor platelets by anticoagulation protocol: Yes   Plan:  Increase to heparin IV infusion at 850 units/hr APTT in 8 hours after rate change. Daily aPTT, heparin level, and CBC   Gretta Arab PharmD, BCPS Clinical pharmacist phone 7am- 5pm: [phone] 10/09/2019 4:34 PM   Addendum: APTT 56, remains subtherapeutic.  HL 0.77, remains supratherapeutic. Heparin infusing at 850 units/hr No bleeding or complications reported. Plan:  Give heparin 1000 units bolus IV x 1 Increase to heparin IV infusion at 1000 units/hr APTT in 8 hours Daily heparin level and CBC  Gretta Arab PharmD, BCPS Clinical pharmacist phone 7am- 5pm: [phone] 10/09/2019 4:37 PM

## 2019-10-09 NOTE — Progress Notes (Signed)
Patient's POA updated on situation and plan.  Explained that patient is still not awake enough for safe extubation.  All questions answered at this time.

## 2019-10-10 LAB — BASIC METABOLIC PANEL
Anion gap: 10 (ref 5–15)
BUN: 99 mg/dL — ABNORMAL HIGH (ref 8–23)
CO2: 26 mmol/L (ref 22–32)
Calcium: 8.3 mg/dL — ABNORMAL LOW (ref 8.9–10.3)
Chloride: 111 mmol/L (ref 98–111)
Creatinine, Ser: 1.34 mg/dL — ABNORMAL HIGH (ref 0.44–1.00)
GFR calc Af Amer: 46 mL/min — ABNORMAL LOW (ref >60)
GFR calc non Af Amer: 40 mL/min — ABNORMAL LOW (ref >60)
Glucose, Bld: 102 mg/dL — ABNORMAL HIGH (ref 70–99)
Potassium: 4.6 mmol/L (ref 3.5–5.1)
Sodium: 147 mmol/L — ABNORMAL HIGH (ref 135–145)

## 2019-10-10 LAB — GLUCOSE, CAPILLARY
Glucose-Capillary: 132 mg/dL — ABNORMAL HIGH (ref 70–99)
Glucose-Capillary: 106 mg/dL — ABNORMAL HIGH (ref 70–99)
Glucose-Capillary: 97 mg/dL (ref 70–99)
Glucose-Capillary: 136 mg/dL — ABNORMAL HIGH (ref 70–99)
Glucose-Capillary: 190 mg/dL — ABNORMAL HIGH (ref 70–99)
Glucose-Capillary: 123 mg/dL — ABNORMAL HIGH (ref 70–99)
Glucose-Capillary: 119 mg/dL — ABNORMAL HIGH (ref 70–99)

## 2019-10-10 LAB — CBC
HCT: 39.8 % (ref 36.0–46.0)
Hemoglobin: 12.5 g/dL (ref 12.0–15.0)
MCH: 32.4 pg (ref 26.0–34.0)
MCHC: 31.4 g/dL (ref 30.0–36.0)
MCV: 103.1 fL — ABNORMAL HIGH (ref 80.0–100.0)
Platelets: 132 10*3/uL — ABNORMAL LOW (ref 150–400)
RBC: 3.86 MIL/uL — ABNORMAL LOW (ref 3.87–5.11)
RDW: 17 % — ABNORMAL HIGH (ref 11.5–15.5)
WBC: 6.8 10*3/uL (ref 4.0–10.5)
nRBC: 0.3 % — ABNORMAL HIGH (ref 0.0–0.2)

## 2019-10-10 LAB — APTT: aPTT: 83 seconds — ABNORMAL HIGH (ref 24–36)

## 2019-10-10 MED ORDER — FENTANYL CITRATE (PF) 100 MCG/2ML IJ SOLN: 12.5000 ug | INTRAMUSCULAR | Status: DC | PRN

## 2019-10-10 MED ORDER — INFLUENZA VAC A&B SA ADJ QUAD 0.5 ML IM PRSY
0.5000 mL | PREFILLED_SYRINGE | INTRAMUSCULAR | Status: DC
  Filled 2019-10-10: qty 0.5

## 2019-10-10 MED ORDER — FREE WATER
200.0000 mL | Freq: Three times a day (TID) | Status: DC
  Administered 2019-10-10 – 2019-10-11 (×3): 200 mL

## 2019-10-10 MED ORDER — PNEUMOCOCCAL VAC POLYVALENT 25 MCG/0.5ML IJ INJ
0.5000 mL | INJECTION | INTRAMUSCULAR | Status: DC
  Filled 2019-10-10: qty 0.5

## 2019-10-10 MED ORDER — INSULIN ASPART 100 UNIT/ML ~~LOC~~ SOLN
0.0000 [IU] | SUBCUTANEOUS | Status: DC
  Administered 2019-10-10: 17:00:00 2 [IU] via SUBCUTANEOUS
  Administered 2019-10-11: 23:00:00 8 [IU] via SUBCUTANEOUS
  Administered 2019-10-11: 3 [IU] via SUBCUTANEOUS
  Administered 2019-10-11 (×5): 5 [IU] via SUBCUTANEOUS
  Administered 2019-10-12 (×3): 3 [IU] via SUBCUTANEOUS
  Administered 2019-10-12: 2 [IU] via SUBCUTANEOUS
  Administered 2019-10-13 (×2): 3 [IU] via SUBCUTANEOUS
  Administered 2019-10-13: 2 [IU] via SUBCUTANEOUS
  Administered 2019-10-13 (×2): 3 [IU] via SUBCUTANEOUS
  Administered 2019-10-14: 5 [IU] via SUBCUTANEOUS
  Administered 2019-10-14 (×3): 2 [IU] via SUBCUTANEOUS
  Administered 2019-10-16: 3 [IU] via SUBCUTANEOUS
  Administered 2019-10-16: 5 [IU] via SUBCUTANEOUS
  Administered 2019-10-16: 3 [IU] via SUBCUTANEOUS
  Administered 2019-10-16: 12:00:00 2 [IU] via SUBCUTANEOUS
  Administered 2019-10-17 (×2): 3 [IU] via SUBCUTANEOUS
  Administered 2019-10-17 (×2): 2 [IU] via SUBCUTANEOUS
  Administered 2019-10-18 (×3): 3 [IU] via SUBCUTANEOUS
  Administered 2019-10-18: 17:00:00 2 [IU] via SUBCUTANEOUS
  Administered 2019-10-19: 13:00:00 3 [IU] via SUBCUTANEOUS
  Administered 2019-10-19: 09:00:00 2 [IU] via SUBCUTANEOUS
  Administered 2019-10-19: 3 [IU] via SUBCUTANEOUS

## 2019-10-10 MED ORDER — FUROSEMIDE 10 MG/ML IJ SOLN
40.0000 mg | Freq: Once | INTRAMUSCULAR | Status: AC
  Administered 2019-10-10: 40 mg via INTRAVENOUS
  Filled 2019-10-10: qty 4

## 2019-10-10 MED ORDER — CHLORHEXIDINE GLUCONATE 0.12 % MT SOLN
15.0000 mL | Freq: Two times a day (BID) | OROMUCOSAL | Status: DC
  Administered 2019-10-10 – 2019-10-20 (×20): 15 mL via OROMUCOSAL
  Filled 2019-10-10 (×15): qty 15

## 2019-10-10 MED ORDER — INSULIN DETEMIR 100 UNIT/ML ~~LOC~~ SOLN
20.0000 [IU] | Freq: Two times a day (BID) | SUBCUTANEOUS | Status: DC
  Administered 2019-10-10 – 2019-10-15 (×10): 20 [IU] via SUBCUTANEOUS
  Filled 2019-10-10 (×11): qty 0.2

## 2019-10-10 NOTE — Progress Notes (Signed)
NAME:  Debra Mcconnell, MRN:  [id number], DOB:  10/21/1945, LOS: 6 ADMISSION DATE:  10/04/2019, CONSULTATION DATE:  10/04/19 REFERRING MD:  [facility], CHIEF COMPLAINT:  SOB   Brief History   74 y/o F who presented to [facility] on 1/26 with reports of altered mental status.  Found to be hypoxic and COVID positive with AKI, hyperglycemia.  CTA chest negative for PE. CT head negative. Had worsening respiratory failure requiring intubation.  Patient transferred to [facility] for further care. Course notable for BiVentricular failure with concern for COVID myocarditis.   Past Medical History  DM II  CVA  HTN HLD A-Flutter - CHADS2Vasc score 5 Hypothyroidism  OSA   Significant Hospital Events   1/26 Admit to [facility] from [facility], intubated / COVID + 1/28 On Vaso, SvO2 72% 1/29 on vasopressin 1/30 Off pressors 2/01 Remains sedate, weaning on PSV  Consults:     Procedures:  ETT 1/26 >> R IJ TLC 1/27 >>   Significant Diagnostic Tests:  CT Head (OSH) 1/26 >> neg CT C-Spine (OSH) 1/26 >>  CTA Chest 1/26 >> negative for PE, +pneumonia ECHO 1/27 >> LVEF 25-30%, global LV hypokinesis, LA severely dilated, RA moderately dilated, evidence of biventricular failure with elevated RA pressure  Micro Data:  COVID 1/26 >> positive Tracheal aspirate 1/26 >> ng BCx2 1/26 >> ng MRSA PCR 1/27 >> negative UC 1/27 >> negative   Antimicrobials:  Remdesivir 1/26 >>1/30 Decadron 1/26 >>   Interim history/subjective:  Tmax 99 I/O - UOP 2L, net 571 negative in 24h PEEP 5 / FiO2 30% Glucose range - 100-210 RN reports pt remains sedate despite no sedation   Objective   Blood pressure 125/70, pulse 85, temperature 99 F (37.2 C), temperature source Oral, resp. rate (!) 23, height 5\' 8"  (1.727 m), weight 104.6 kg, SpO2 99 %.    Vent Mode: CPAP;PSV FiO2 (%):  [30 %] 30 % Set Rate:  [24 bmp] 24 bmp Vt Set:  [440 mL] 440 mL PEEP:  [5 cmH20] 5 cmH20 Pressure Support:  [5 cmH20] 5  cmH20 Plateau Pressure:  [16 cmH20-18 cmH20] 16 cmH20   Intake/Output Summary (Last 24 hours) at 10/10/2019 1442 Last data filed at 10/10/2019 1300 Gross per 24 hour  Intake 1438.24 ml  Output 2875 ml  Net -1436.76 ml   Filed Weights   10/06/19 0600 10/08/19 0500 10/10/19 0500  Weight: 104.5 kg 105.8 kg 104.6 kg    Examination: General: obese elderly female lying in bed on vent in NAD HEENT: MM pink/moist, ETT, pupils reactive/symmetrical  Neuro: sedate, opens eyes to voice but drifts back to sleep  CV: s1s2 RRR, no m/r/g PULM:  Non-labored, lungs bilaterally diminished  GI: soft, bsx4 active  Extremities: warm/dry, 1+ BLE edema  Skin: no rashes or lesions  LABS:  Recent Labs  Lab 10/08/19 0513 10/09/19 0354 10/10/19 0450  HGB 12.1 12.1 12.5  HCT 38.1 38.7 39.8  WBC 8.0 6.8 6.8  PLT 114* 117* 132*   Recent Labs  Lab 10/04/19 2107 10/04/19 2107 10/05/19 0458 10/05/19 1844 10/06/19 0400 10/06/19 0400 10/06/19 1329 10/06/19 1329 10/07/19 0230 10/07/19 0230 10/08/19 0513 10/08/19 0513 10/09/19 0354 10/10/19 0450  NA 137   < > 143   < > 141   < > 145  --  144  --  145  --  147* 147*  K 4.2   < > 3.9   < > 4.2   < > 4.0   < >  3.8   < > 4.2   < > 4.3 4.6  CL 102   < > 110  --  110  --   --   --  111  --  111  --  110 111  CO2 20*   < > 23  --  20*  --   --   --  22  --  24  --  28 26  GLUCOSE 396*   < > 115*  --  130*  --   --   --  171*  --  210*  --  159* 102*  BUN 67*   < > 72*  --  81*  --   --   --  87*  --  85*  --  94* 99*  CREATININE 1.88*   < > 1.89*  --  1.74*  --   --   --  1.54*  --  1.27*  --  1.37* 1.34*  CALCIUM 7.5*   < > 8.2*  --  8.1*  --   --   --  8.1*  --  8.3*  --  8.2* 8.3*  MG 2.0  --  2.1  --   --   --   --   --   --   --   --   --  2.6*  --   PHOS 5.2*  --  4.1  --   --   --   --   --   --   --   --   --  4.0  --    < > = values in this interval not displayed.     Resolved Hospital Problem list   Hx MRSA Foot Wound Infection -  positive on 05/13/2019. Healed, no acute interventions during admit Lactic Acidosis, cleared  Assessment & Plan:   ARDS secondary to COVID PNA Completed remdesivir.  -low Vt ventilation 4-8cc/kg -goal plateau pressure <30, driving pressure [id number] cm H2O -target PaO2 55-65, titrate PEEP/FiO2 per ARDS protocol  -goal CVP <4, diuresis as necessary -VAP prevention measures  -follow intermittent CXR  -continue decadron  Sedation Needs in setting of Mechanical Ventilation  Hx CVA, HOH, Frequent Falls prior to Admit -minimize sedation  -intermittent PRN fentanyl only  -continue bupropion   AKI  Slight increase in BUN/creatinine/sodium with Lasix 1/30 suggesting that she is intravascularly dry -Trend BMP / urinary output -Replace electrolytes as indicated -Avoid nephrotoxic agents, ensure adequate renal perfusion -lasix 40 mg IV x1  Thrombocytopenia  -trend CBC, no evidence of bleeding   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 Bettina Gavia.  New LBBB on admit, BNP >2k.  Suspect element of congestion with elevated LFT's -Appreciate Cardiology  -continue Coreg -doubt she will tolerated ARB (mild high K) -will need repeat ECHO, Cardiology f/u  -hold home norvasc, olmesartan  Hx AFlutter, 4:1 Conduction Splenic Infarcts?  On eliquis PTA. 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  Doubt failure of Eliquis -continue heparin gtt per pharmacy   Hx OSA  -will need CPAP post extubation  DM II with Peripheral Neuropathy  Hgb A1c 7  -SSI  -continue TF coverage  -levemir 20 units BID  -continue home gabapentin  -hold home amaryl   Anxiety / Depression  -continue home wellbutrin  -hold home ambien   Goals of care -no reintubation as per discussion on 1/29,  reclarified with [facility] POA on 1/31  Best practice:  Diet: NPO, TF ordered Pain/Anxiety/Delirium  protocol (if indicated): Ordered VAP protocol (if indicated): Ordered DVT prophylaxis: heparin gtt GI prophylaxis: Pepcid Glucose control: SSI Mobility: BR   Code Status: DNR in case of arrest 1/29, no reintubation Family Communication: Neighbor [place] ([place]) updated 2/1   Disposition: ICU    CC Time: 36 minutes   Noe Gens, MSN, NP-C [facility] 10/10/2019, 2:42 PM   Please see [email or web address] for pager details.

## 2019-10-10 NOTE — Progress Notes (Signed)
[place] for Heparin IV Indication: atrial fibrillation, r/o LV thrombus, splenic infarct (PTA apixaban)  Allergies  Allergen Reactions  . Gabapentin Other (See Comments)    Dizziness and drowsiness  . Lisinopril Cough    Patient Measurements: Height: 5\' 8"  (172.7 cm) Weight: 233 lb 4 oz (105.8 kg) IBW/kg (Calculated) : 63.9 Heparin Dosing Weight: 83 kg  Vital Signs: Temp: 98.2 F (36.8 C) (02/01 0000) Temp Source: Oral (02/01 0000) BP: 130/60 (02/01 0100) Pulse Rate: 72 (02/01 0100)  Labs: Recent Labs    10/08/19 0513 10/08/19 0513 10/09/19 0354 10/09/19 1530 10/10/19 0145  HGB 12.1  --  12.1  --   --   HCT 38.1  --  38.7  --   --   PLT 114*  --  117*  --   --   APTT 71*   < > 55* 56* 83*  HEPARINUNFRC 0.90*  --  0.74* 0.77*  --   CREATININE 1.27*  --  1.37*  --   --    < > = values in this interval not displayed.    Estimated Creatinine Clearance: 48 mL/min (A) (by C-G formula based on SCr of 1.37 mg/dL (H)).    Medications:  Infusions:  . sodium chloride 250 mL (10/05/19 1208)  . dextrose    . feeding supplement (VITAL AF 1.2 CAL) 1,000 mL (10/09/19 1624)  . heparin 1,000 Units/hr (10/10/19 0000)    Assessment: 43 yoF admitted on 1/26 with COVID-19 pneumonia.  Pharmacy is consulted to transition from apixaban to heparin IV for Afib, possible apixaban failure with splenic infarcts, and r/o LV thrombus (echo w/ contrast pending).    Last apixaban 5 mg BID dose given on 1/27 at 0944  10/10/19 0300  aPTT:  83 seconds-->now therapeutic on heparin at 1000 unist/hr. CBC in am RN reports no bleeding or complications with infusion.  Goal of Therapy:  Heparin level 0.3-0.7 units/ml aPTT 66-102 seconds Monitor platelets by anticoagulation protocol: Yes   Plan:  Continue heparin infusion rate at 1000 units/hr Continue to use aPTT to titrate heparin infusion until HL correlates Daily aPTT, heparin level, and  CBC   Despina Pole, Pharm. D. Clinical Pharmacist 10/10/2019 3:41 AM

## 2019-10-10 NOTE — Progress Notes (Signed)
Patient's POA updated. Explained patient is still not waking up enough for safe extubation. Answered all questions.

## 2019-10-10 NOTE — Plan of Care (Signed)
Patient remains too lethargic for safe extubation. She will wake to pain and sometimes to loud voice, does not stay awake long enough for following commands.   Problem: Education: Goal: Knowledge of risk factors and measures for prevention of condition will improve Outcome: Progressing   Problem: Coping: Goal: Psychosocial and spiritual needs will be supported Outcome: Progressing   Problem: Respiratory: Goal: Will maintain a patent airway Outcome: Progressing Goal: Complications related to the disease process, condition or treatment will be avoided or minimized Outcome: Progressing   Problem: Education: Goal: Knowledge of General Education information will improve Description: Including pain rating scale, medication(s)/side effects and non-pharmacologic comfort measures Outcome: Progressing   Problem: Health Behavior/Discharge Planning: Goal: Ability to manage health-related needs will improve Outcome: Progressing   Problem: Clinical Measurements: Goal: Ability to maintain clinical measurements within normal limits will improve Outcome: Progressing Goal: Will remain free from infection Outcome: Progressing Goal: Diagnostic test results will improve Outcome: Progressing Goal: Respiratory complications will improve Outcome: Progressing Goal: Cardiovascular complication will be avoided Outcome: Progressing   Problem: Activity: Goal: Risk for activity intolerance will decrease Outcome: Progressing   Problem: Nutrition: Goal: Adequate nutrition will be maintained Outcome: Progressing   Problem: Coping: Goal: Level of anxiety will decrease Outcome: Progressing   Problem: Elimination: Goal: Will not experience complications related to bowel motility Outcome: Progressing Goal: Will not experience complications related to urinary retention Outcome: Progressing   Problem: Pain Managment: Goal: General experience of comfort will improve Outcome: Progressing   Problem:  Safety: Goal: Ability to remain free from injury will improve Outcome: Progressing   Problem: Skin Integrity: Goal: Risk for impaired skin integrity will decrease Outcome: Progressing

## 2019-10-11 ENCOUNTER — Inpatient Hospital Stay (HOSPITAL_COMMUNITY): Payer: Medicare Other

## 2019-10-11 LAB — GLUCOSE, CAPILLARY
Glucose-Capillary: 196 mg/dL — ABNORMAL HIGH (ref 70–99)
Glucose-Capillary: 207 mg/dL — ABNORMAL HIGH (ref 70–99)
Glucose-Capillary: 208 mg/dL — ABNORMAL HIGH (ref 70–99)
Glucose-Capillary: 212 mg/dL — ABNORMAL HIGH (ref 70–99)
Glucose-Capillary: 230 mg/dL — ABNORMAL HIGH (ref 70–99)
Glucose-Capillary: 250 mg/dL — ABNORMAL HIGH (ref 70–99)
Glucose-Capillary: 251 mg/dL — ABNORMAL HIGH (ref 70–99)

## 2019-10-11 LAB — CBC
HCT: 39.2 % (ref 36.0–46.0)
Hemoglobin: 12.2 g/dL (ref 12.0–15.0)
MCH: 32.3 pg (ref 26.0–34.0)
MCHC: 31.1 g/dL (ref 30.0–36.0)
MCV: 103.7 fL — ABNORMAL HIGH (ref 80.0–100.0)
Platelets: 135 10*3/uL — ABNORMAL LOW (ref 150–400)
RBC: 3.78 MIL/uL — ABNORMAL LOW (ref 3.87–5.11)
RDW: 17.1 % — ABNORMAL HIGH (ref 11.5–15.5)
WBC: 6.3 10*3/uL (ref 4.0–10.5)
nRBC: 0 % (ref 0.0–0.2)

## 2019-10-11 LAB — BASIC METABOLIC PANEL
Anion gap: 10 (ref 5–15)
BUN: 96 mg/dL — ABNORMAL HIGH (ref 8–23)
CO2: 27 mmol/L (ref 22–32)
Calcium: 8.2 mg/dL — ABNORMAL LOW (ref 8.9–10.3)
Chloride: 109 mmol/L (ref 98–111)
Creatinine, Ser: 1.15 mg/dL — ABNORMAL HIGH (ref 0.44–1.00)
GFR calc Af Amer: 55 mL/min — ABNORMAL LOW (ref >60)
GFR calc non Af Amer: 48 mL/min — ABNORMAL LOW (ref >60)
Glucose, Bld: 205 mg/dL — ABNORMAL HIGH (ref 70–99)
Potassium: 4.5 mmol/L (ref 3.5–5.1)
Sodium: 146 mmol/L — ABNORMAL HIGH (ref 135–145)

## 2019-10-11 LAB — APTT: aPTT: 105 seconds — ABNORMAL HIGH (ref 24–36)

## 2019-10-11 LAB — HEPARIN LEVEL (UNFRACTIONATED)
Heparin Unfractionated: 0.93 IU/mL — ABNORMAL HIGH (ref 0.30–0.70)
Heparin Unfractionated: 0.96 IU/mL — ABNORMAL HIGH (ref 0.30–0.70)
Heparin Unfractionated: 0.66 IU/mL (ref 0.30–0.70)

## 2019-10-11 MED ORDER — INSULIN ASPART 100 UNIT/ML ~~LOC~~ SOLN
5.0000 [IU] | SUBCUTANEOUS | Status: DC
  Administered 2019-10-11 – 2019-10-19 (×37): 5 [IU] via SUBCUTANEOUS

## 2019-10-11 MED ORDER — FREE WATER
200.0000 mL | Freq: Four times a day (QID) | Status: DC
  Administered 2019-10-11 – 2019-10-19 (×33): 200 mL

## 2019-10-11 MED ORDER — FUROSEMIDE 10 MG/ML IJ SOLN
40.0000 mg | Freq: Every day | INTRAMUSCULAR | Status: DC
  Administered 2019-10-11 – 2019-10-14 (×4): 40 mg via INTRAVENOUS
  Filled 2019-10-11 (×4): qty 4

## 2019-10-11 NOTE — Progress Notes (Signed)
Inpatient Diabetes Program Recommendations  AACE/ADA: New Consensus Statement on Inpatient Glycemic Control   Target Ranges:  Prepandial:   less than 140 mg/dL      Peak postprandial:   less than 180 mg/dL (1-2 hours)      Critically ill patients:  140 - 180 mg/dL   Results for [name], [name] (MRN [id number]) as of 10/11/2019 11:28  Ref. Range 10/10/2019 07:58 10/10/2019 11:56 10/10/2019 16:11 10/10/2019 19:31 10/11/2019 00:38 10/11/2019 03:44 10/11/2019 07:33 10/11/2019 11:07  Glucose-Capillary Latest Ref Range: 70 - 99 mg/dL 97 136 (H) 123 (H) 119 (H) 212 (H) 196 (H) 208 (H) 207 (H)   Review of Glycemic Control  Current orders for Inpatient glycemic control: Levemir 20 units BID, Novolog 0-15 units Q4H; Decadron 6 mg daily, Vital @ 55 ml/hr  Inpatient Diabetes Program Recommendations:   Insulin-Tube Feeding Coverage: Noted Novolog 5 units Q4H for tube feeding was discontinued on 10/10/19. Please reorder Novolog 5 units Q4H for tube feeding coverage. If tube feeding is stopped or held then Novolog tube feeding coverage should also be stopped or held.  Thanks, Barnie Alderman, RN, MSN, CDE Diabetes Coordinator Inpatient Diabetes Program [phone] (Team Pager from 8am to 5pm)

## 2019-10-11 NOTE — Progress Notes (Signed)
[place] for Heparin IV Indication: atrial fibrillation, r/o LV thrombus, splenic infarct (PTA apixaban)  Allergies  Allergen Reactions  . Gabapentin Other (See Comments)    Dizziness and drowsiness  . Lisinopril Cough    Patient Measurements: Height: 5\' 8"  (172.7 cm) Weight: 222 lb 10.6 oz (101 kg) IBW/kg (Calculated) : 63.9 Heparin Dosing Weight: 83 kg  Vital Signs: Temp: 98.2 F (36.8 C) (02/02 1130) Temp Source: Axillary (02/02 1130) BP: 139/71 (02/02 1504) Pulse Rate: 74 (02/02 1138)  Labs: Recent Labs    10/09/19 0354 10/09/19 0354 10/09/19 1530 10/10/19 0145 10/10/19 0450 10/11/19 0450 10/11/19 1455  HGB 12.1   < >  --   --  12.5 12.2  --   HCT 38.7  --   --   --  39.8 39.2  --   PLT 117*  --   --   --  132* 135*  --   APTT 55*   < > 56* 83*  --  105*  --   HEPARINUNFRC 0.74*   < > 0.77*  --   --  0.93* 0.96*  CREATININE 1.37*  --   --   --  1.34* 1.15*  --    < > = values in this interval not displayed.    Estimated Creatinine Clearance: 55.7 mL/min (A) (by C-G formula based on SCr of 1.15 mg/dL (H)).   Assessment: 53 yoF admitted on 1/26 with COVID-19 pneumonia.  Pharmacy is consulted to transition from apixaban to heparin IV for Afib, possible apixaban failure with splenic infarcts, and r/o LV thrombus (echo w/ contrast pending).     Currently on IV heparin at 900 units/hr. HL remains supratherapeutic at 0.96. H/H remains stable, Plt low but stable.   Goal of Therapy:  Heparin level 0.3-0.7 units/ml Monitor platelets by anticoagulation protocol: Yes   Plan:  Decrease  heparin infusion rate to 800 units/hr Daily  heparin level, and CBC   Albertina Parr, PharmD., BCPS Clinical Pharmacist Clinical phone for 10/11/19 until 5pm: [phone]

## 2019-10-11 NOTE — Progress Notes (Signed)
NAME:  ARIN CLOWER, MRN:  [id number], DOB:  [birth year], LOS: 7 ADMISSION DATE:  10/04/2019, CONSULTATION DATE:  10/04/19 REFERRING MD:  [facility], CHIEF COMPLAINT:  SOB   Brief History   74 y/o F who presented to [facility] on 1/26 with reports of altered mental status.  Found to be hypoxic and COVID positive with AKI, hyperglycemia.  CTA chest negative for PE. CT head negative. Had worsening respiratory failure requiring intubation.  Patient transferred to [facility] for further care. Course notable for BiVentricular failure with concern for COVID myocarditis.   Past Medical History  DM II  CVA  HTN HLD A-Flutter - CHADS2Vasc score 5 Hypothyroidism  OSA   Significant Hospital Events   1/26 Admit to [facility] from [facility], intubated / COVID + 1/28 On Vaso, SvO2 72% 1/29 on vasopressin 1/30 Off pressors 2/01 Remains sedate, weaning on PSV  Consults:     Procedures:  ETT 1/26 >> R IJ TLC 1/27 >>   Significant Diagnostic Tests:  CT Head (OSH) 1/26 >> neg CT C-Spine (OSH) 1/26 >>  CTA Chest 1/26 >> negative for PE, +pneumonia ECHO 1/27 >> LVEF 25-30%, global LV hypokinesis, LA severely dilated, RA moderately dilated, evidence of biventricular failure with elevated RA pressure  Micro Data:  COVID 1/26 >> positive Tracheal aspirate 1/26 >> ng BCx2 1/26 >> ng MRSA PCR 1/27 >> negative UC 1/27 >> negative   Antimicrobials:  Remdesivir 1/26 >>1/30 Decadron 1/26 >>   Interim history/subjective:  Tmax 98.1 I/O - UOP 2.8L, net neg 955 ml in 24h Glucose range -  150-210 PEEP 5, FiO2 30% No acute events overnight.  Objective   Blood pressure 133/75, pulse 75, temperature 98.1 F (36.7 C), temperature source Axillary, resp. rate (!) 24, height 5\' 8"  (1.727 m), weight 101 kg, SpO2 99 %. CVP:  [3 mmHg-5 mmHg] 5 mmHg  Vent Mode: PRVC FiO2 (%):  [30 %] 30 % Set Rate:  [24 bmp] 24 bmp Vt Set:  [440 mL] 440 mL PEEP:  [5 cmH20] 5 cmH20 Pressure Support:  [5  cmH20] 5 cmH20 Plateau Pressure:  [16 cmH20] 16 cmH20   Intake/Output Summary (Last 24 hours) at 10/11/2019 [id number] Last data filed at 10/11/2019 0600 Gross per 24 hour  Intake 1774.37 ml  Output 2505 ml  Net -730.63 ml   Filed Weights   10/08/19 0500 10/10/19 0500 10/11/19 0412  Weight: 105.8 kg 104.6 kg 101 kg    Examination: General: elderly female lying in bed on vent in NAD HEENT: MM pink/moist, ETT, no jvd Neuro: Awakens to voice, nods / more interactive today compared to yesterday CV: s1s2 irr irr, no m/r/g PULM:  Non-labored on vent, lungs bilaterally clear but diminished  GI: soft, bsx4 active  Extremities: warm/dry, improved BLE trace to 1+ pitting edema  Skin: no rashes or lesions  PCXR 2/2 >> images personally reviewed, ETT in good position, no significant airspace disease, effusions improved  LABS:  Recent Labs  Lab 10/09/19 0354 10/10/19 0450 10/11/19 0450  HGB 12.1 12.5 12.2  HCT 38.7 39.8 39.2  WBC 6.8 6.8 6.3  PLT 117* 132* 135*   Recent Labs  Lab 10/04/19 2107 10/04/19 2107 10/05/19 0458 10/05/19 1844 10/07/19 0230 10/07/19 0230 10/08/19 0513 10/08/19 0513 10/09/19 0354 10/09/19 0354 10/10/19 0450 10/11/19 0450  NA 137   < > 143   < > 144  --  145  --  147*  --  147* 146*  K 4.2   < >  3.9   < > 3.8   < > 4.2   < > 4.3   < > 4.6 4.5  CL 102   < > 110   < > 111  --  111  --  110  --  111 109  CO2 20*   < > 23   < > 22  --  24  --  28  --  26 27  GLUCOSE 396*   < > 115*   < > 171*  --  210*  --  159*  --  102* 205*  BUN 67*   < > 72*   < > 87*  --  85*  --  94*  --  99* 96*  CREATININE 1.88*   < > 1.89*   < > 1.54*  --  1.27*  --  1.37*  --  1.34* 1.15*  CALCIUM 7.5*   < > 8.2*   < > 8.1*  --  8.3*  --  8.2*  --  8.3* 8.2*  MG 2.0  --  2.1  --   --   --   --   --  2.6*  --   --   --   PHOS 5.2*  --  4.1  --   --   --   --   --  4.0  --   --   --    < > = values in this interval not displayed.     Resolved Hospital Problem list   Hx MRSA Foot  Wound Infection - positive on 05/13/2019. Healed, no acute interventions during admit Lactic Acidosis, cleared  Assessment & Plan:   ARDS secondary to COVID PNA Completed remdesivir.  -daily PSV wean, mental status remains barrier to extubation but significantly improved off sedation -low Vt ventilation 4-8cc/kg -goal plateau pressure <30, driving pressure [id number] cm H2O -target PaO2 55-65, titrate PEEP/FiO2 per ARDS protocol  -goal CVP <4, diuresis as necessary -VAP prevention measures  -follow intermittent CXR  -continue decadron  -if plan is to extubate, HCPOA would like to be notified beforehand if possible  Sedation Needs in setting of Mechanical Ventilation  Hx CVA, HOH, Frequent Falls prior to Admit -PRN low dose fentanyl only  -continue bupropion   AKI  Slight increase in BUN/creatinine/sodium with Lasix 1/30 suggesting that she is intravascularly dry -Trend BMP / urinary output -free water 274ml Q6 -Replace electrolytes as indicated -Avoid nephrotoxic agents, ensure adequate renal perfusion -schedule lasix 40 mg IV QD  Thrombocytopenia   Improving, no evidence of bleeding -trend CBC  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 Bettina Gavia.  New LBBB on admit, BNP >2k.  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  On eliquis PTA. 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  Doubt failure of Eliquis -continue heparin gtt   Hx OSA  -will need CPAP post extubation   DM II with Peripheral Neuropathy  Hgb A1c 7  -SSI -levemir 20 units BID -TF coverage -hold home amaryl  -continue home gabapentin   Anxiety / Depression    -continue home wellbutrin   Best practice:  Diet: NPO, TF ordered Pain/Anxiety/Delirium protocol (if indicated): Ordered VAP protocol (  if indicated): Ordered DVT prophylaxis: heparin gtt GI prophylaxis: Pepcid Glucose control: SSI Mobility: BR   Code Status: DNR in case of arrest 1/29, no reintubation if fails extubation. Family Communication: Neighbor [name] ([place]) updated 2/2 on plan of care.  Disposition: ICU    CC Time: 5 minutes   Noe Gens, MSN, NP-C [place] [facility] 10/11/2019, 8:38 AM   Please see [email or web address] for pager details.

## 2019-10-11 NOTE — Progress Notes (Signed)
[place] for Heparin IV Indication: atrial fibrillation, r/o LV thrombus, splenic infarct (PTA apixaban)  Allergies  Allergen Reactions  . Gabapentin Other (See Comments)    Dizziness and drowsiness  . Lisinopril Cough    Patient Measurements: Height: 5\' 8"  (172.7 cm) Weight: 222 lb 10.6 oz (101 kg) IBW/kg (Calculated) : 63.9 Heparin Dosing Weight: 83 kg  Vital Signs: Temp: 98.1 F (36.7 C) (02/02 0400) Temp Source: Axillary (02/02 0400) BP: 122/66 (02/02 0600) Pulse Rate: 75 (02/02 0600)  Labs: Recent Labs    10/09/19 0354 10/09/19 0354 10/09/19 1530 10/10/19 0145 10/10/19 0450 10/11/19 0450  HGB 12.1   < >  --   --  12.5 12.2  HCT 38.7  --   --   --  39.8 39.2  PLT 117*  --   --   --  132* 135*  APTT 55*   < > 56* 83*  --  105*  HEPARINUNFRC 0.74*  --  0.77*  --   --  0.93*  CREATININE 1.37*  --   --   --  1.34* 1.15*   < > = values in this interval not displayed.    Estimated Creatinine Clearance: 55.7 mL/min (A) (by C-G formula based on SCr of 1.15 mg/dL (H)).   Assessment: 14 yoF admitted on 1/26 with COVID-19 pneumonia.  Pharmacy is consulted to transition from apixaban to heparin IV for Afib, possible apixaban failure with splenic infarcts, and r/o LV thrombus (echo w/ contrast pending).    Last apixaban 5 mg BID dose given on 1/27 at 0944  10/11/19 0700  aPTT:  105  seconds-->slightly supra-therapeutic on heparin at 1000 unist/hr. HL: 0.93 IU/mL-->above therapeutic goal range  RN reports no bleeding or complications with infusion. Infusion changed to peripheral fluid. CBC remains stable; plates 135  Goal of Therapy:  Heparin level 0.3-0.7 units/ml aPTT 66-102 seconds Monitor platelets by anticoagulation protocol: Yes   Plan:  Decrease  heparin infusion rate to  900 units/hr Check HL at 1500 today Discontinue aPTTs Daily  heparin level, and CBC   Despina Pole, Pharm. D. Clinical Pharmacist 10/11/2019 7:00  AM

## 2019-10-12 ENCOUNTER — Inpatient Hospital Stay (HOSPITAL_COMMUNITY): Payer: Medicare Other

## 2019-10-12 LAB — CBC
HCT: 39.8 % (ref 36.0–46.0)
Hemoglobin: 12.2 g/dL (ref 12.0–15.0)
MCH: 31.9 pg (ref 26.0–34.0)
MCHC: 30.7 g/dL (ref 30.0–36.0)
MCV: 104.2 fL — ABNORMAL HIGH (ref 80.0–100.0)
Platelets: 165 10*3/uL (ref 150–400)
RBC: 3.82 MIL/uL — ABNORMAL LOW (ref 3.87–5.11)
RDW: 17 % — ABNORMAL HIGH (ref 11.5–15.5)
WBC: 6 10*3/uL (ref 4.0–10.5)
nRBC: 0 % (ref 0.0–0.2)

## 2019-10-12 LAB — BASIC METABOLIC PANEL
Anion gap: 10 (ref 5–15)
BUN: 95 mg/dL — ABNORMAL HIGH (ref 8–23)
CO2: 28 mmol/L (ref 22–32)
Calcium: 8.4 mg/dL — ABNORMAL LOW (ref 8.9–10.3)
Chloride: 109 mmol/L (ref 98–111)
Creatinine, Ser: 1.1 mg/dL — ABNORMAL HIGH (ref 0.44–1.00)
GFR calc Af Amer: 58 mL/min — ABNORMAL LOW (ref >60)
GFR calc non Af Amer: 50 mL/min — ABNORMAL LOW (ref >60)
Glucose, Bld: 144 mg/dL — ABNORMAL HIGH (ref 70–99)
Potassium: 4.9 mmol/L (ref 3.5–5.1)
Sodium: 147 mmol/L — ABNORMAL HIGH (ref 135–145)

## 2019-10-12 LAB — GLUCOSE, CAPILLARY
Glucose-Capillary: 123 mg/dL — ABNORMAL HIGH (ref 70–99)
Glucose-Capillary: 143 mg/dL — ABNORMAL HIGH (ref 70–99)
Glucose-Capillary: 160 mg/dL — ABNORMAL HIGH (ref 70–99)
Glucose-Capillary: 184 mg/dL — ABNORMAL HIGH (ref 70–99)
Glucose-Capillary: 185 mg/dL — ABNORMAL HIGH (ref 70–99)
Glucose-Capillary: 64 mg/dL — ABNORMAL LOW (ref 70–99)

## 2019-10-12 LAB — POCT I-STAT 7, (LYTES, BLD GAS, ICA,H+H)
Acid-Base Excess: 2 mmol/L (ref 0.0–2.0)
Bicarbonate: 32.2 mmol/L — ABNORMAL HIGH (ref 20.0–28.0)
Calcium, Ion: 1.33 mmol/L (ref 1.15–1.40)
HCT: 30 % — ABNORMAL LOW (ref 36.0–46.0)
Hemoglobin: 10.2 g/dL — ABNORMAL LOW (ref 12.0–15.0)
O2 Saturation: 70 %
Patient temperature: 97.9
Potassium: 4.4 mmol/L (ref 3.5–5.1)
Sodium: 140 mmol/L (ref 135–145)
TCO2: 35 mmol/L — ABNORMAL HIGH (ref 22–32)
pCO2 arterial: 82.6 mmHg (ref 32.0–48.0)
pH, Arterial: 7.196 — CL (ref 7.350–7.450)
pO2, Arterial: 46 mmHg — ABNORMAL LOW (ref 83.0–108.0)

## 2019-10-12 LAB — HEPARIN LEVEL (UNFRACTIONATED): Heparin Unfractionated: 0.64 IU/mL (ref 0.30–0.70)

## 2019-10-12 MED ORDER — APIXABAN 5 MG PO TABS
5.0000 mg | ORAL_TABLET | Freq: Two times a day (BID) | ORAL | Status: DC
  Administered 2019-10-12 – 2019-10-19 (×15): 5 mg
  Filled 2019-10-12 (×15): qty 1

## 2019-10-12 MED ORDER — VITAL AF 1.2 CAL PO LIQD
1000.0000 mL | ORAL | Status: DC
  Administered 2019-10-12: 1000 mL
  Filled 2019-10-12: qty 1000

## 2019-10-12 MED ORDER — DEXTROSE 50 % IV SOLN
INTRAVENOUS | Status: AC
  Administered 2019-10-12: 50 mL
  Filled 2019-10-12: qty 50

## 2019-10-12 MED ORDER — APIXABAN 5 MG PO TABS: 5.0000 mg | ORAL_TABLET | Freq: Two times a day (BID) | ORAL | Status: DC

## 2019-10-12 NOTE — Progress Notes (Addendum)
[place] for Heparin IV Indication: atrial fibrillation, r/o LV thrombus, splenic infarct (PTA apixaban)  Allergies  Allergen Reactions  . Gabapentin Other (See Comments)    Dizziness and drowsiness  . Lisinopril Cough    Patient Measurements: Height: 5\' 8"  ([id number] cm) Weight: 222 lb 10.6 oz (101 kg) IBW/kg (Calculated) : 63.9 Heparin Dosing Weight: 83 kg  Vital Signs: Temp: 98.4 F (36.9 C) (02/02 2000) Temp Source: Oral (02/02 2000) BP: 120/70 (02/02 2300) Pulse Rate: 73 (02/02 2300)  Labs: Recent Labs    10/09/19 0354 10/09/19 0354 10/09/19 1530 10/09/19 1530 10/10/19 0145 10/10/19 0450 10/11/19 0450 10/11/19 1455 10/11/19 2220  HGB 12.1   < >  --   --   --  12.5 12.2  --   --   HCT 38.7  --   --   --   --  39.8 39.2  --   --   PLT 117*  --   --   --   --  132* 135*  --   --   APTT 55*   < > 56*  --  83*  --  105*  --   --   HEPARINUNFRC 0.74*   < > 0.77*   < >  --   --  0.93* 0.96* 0.66  CREATININE 1.37*  --   --   --   --  1.34* 1.15*  --   --    < > = values in this interval not displayed.    Estimated Creatinine Clearance: 55.7 mL/min (A) (by C-G formula based on SCr of 1.15 mg/dL (H)).   Assessment: 55 yoF admitted on 1/26 with COVID-19 pneumonia.  Pharmacy is consulted to transition from apixaban to heparin IV for Afib, possible apixaban failure with splenic infarcts, and r/o LV thrombus (echo w/ contrast pending).     10/12/19 0000 HL: 0.66 IU/mL, within therapeutic goal range on heparin at 800 units/hr RN reports no bleeding complications and no issues with infusion site  Goal of Therapy:  Heparin level 0.3-0.7 units/ml Monitor platelets by anticoagulation protocol: Yes   Plan:  Continue heparin infusion rate at 800 units/hr Next HL with am labs  Daily  heparin level  and CBC  Despina Pole, Pharm. D. Clinical Pharmacist 10/12/2019 12:15 AM

## 2019-10-12 NOTE — Progress Notes (Signed)
Nutrition Follow-up RD working remotely.  DOCUMENTATION CODES:   Obesity unspecified  INTERVENTION:   Continue TF via postpyloric Cortrak, would prefer continuous feedings since Cortrak tip is postpyloric:   Increase Vital AF 1.2 to 80 ml/h  D/C Pro-stat.  Provides 2304 kcal, 144 gm protein, 1557 ml free water daily.  NUTRITION DIAGNOSIS:   Increased nutrient needs related to acute illness, catabolic SPQZRAQ([id number]) as evidenced by estimated needs.  Ongoing   GOAL:   Patient will meet greater than or equal to 90% of their needs  Met with TF  MONITOR:   TF tolerance, Skin, Labs  ASSESSMENT:   74 year old female who presented on 1/26 as a transfer from [facility] where she presented with AMS. Pt found to be hypoxic and COVID-19 positive and required intubation. PMH of T2DM, CVA, HTN, HLD, OSA.  Patient was extubated this morning.  Patient not ready for swallow evaluation yet. Plans to continue TF via Cortrak. Currently receiving Vital AF 1.2 at 55 ml/h with Pro-stat 30 ml BID, providing 1784 kcal, 129 gm protein, 1071 ml free water daily.  Admission weight 90.7 kg, currently 100.5 kg I/O - 5.2 L since admission  Labs reviewed. CBG's: 160-123  Diet Order:   Diet Order            Diet NPO time specified  Diet effective now              EDUCATION NEEDS:   No education needs have been identified at this time  Skin:  Skin Assessment: Skin Integrity Issues: Skin Integrity Issues:: Stage I Stage I: sacrum  Last BM:  1/29 type 4  Height:   Ht Readings from Last 1 Encounters:  10/06/19 5' 8" (1.727 m)    Weight:   Wt Readings from Last 1 Encounters:  10/12/19 100.5 kg    Ideal Body Weight:  63.6 kg  BMI:  Body mass index is 33.69 kg/m.  Estimated Nutritional Needs:   Kcal:  2200-2400  Protein:  125-150 gm  Fluid:  >/= 1.8 L    Molli Barrows, RD, LDN, [place] Pager [phone] After Hours Pager [phone]

## 2019-10-12 NOTE — Progress Notes (Signed)
Spoke to MD Vanita Ingles about plan to remove central line.  I had been told by by prior RN that they had been told it could be removed, but I did not see any orders related to this task.  MD Vanita Ingles also did not see any notes stating plan to remove central line.  We will leave line in overnight and discuss with team in the morning.

## 2019-10-12 NOTE — Progress Notes (Signed)
RT placed patient on BIPAP HS. Patient on 10/5 and 30%. Patient tolerating well at this time.

## 2019-10-12 NOTE — Progress Notes (Signed)
Called patient's point of contact [name] to give her an update on the patient's status.  Informed her that the patient is currently on room air.  She remains lethargic, but is responsive once woken up.  [name] had no further questions at this time.

## 2019-10-12 NOTE — Progress Notes (Signed)
SLP Cancellation Note  Patient Details Name: Debra Mcconnell MRN: [id number] DOB: 1946-03-18   Cancelled treatment:       Reason Eval/Treat Not Completed: Patient not medically ready. Talked with RN, pt extubated around 9, has a Cortrak, best to wait until tomorrow for assessment.    Treyvonne Tata, Katherene Ponto 10/12/2019, 12:50 PM

## 2019-10-12 NOTE — Progress Notes (Signed)
NAME:  Debra Mcconnell, MRN:  [id number], DOB:  [birth year], LOS: 8 ADMISSION DATE:  10/04/2019, CONSULTATION DATE:  10/04/19 REFERRING MD:  [facility], CHIEF COMPLAINT:  SOB   Brief History   74 y/o F who presented to [facility] on 1/26 with reports of altered mental status.  Found to be hypoxic and COVID positive with AKI, hyperglycemia.  CTA chest negative for PE. CT head negative. Had worsening respiratory failure requiring intubation.  Patient transferred to [facility] for further care. Course notable for BiVentricular failure with concern for COVID myocarditis.   Past Medical History  DM II  CVA  HTN HLD A-Flutter - CHADS2Vasc score 5 Hypothyroidism  OSA   Significant Hospital Events   1/26 Admit to [facility] from [facility], intubated / COVID + 1/28 On Vaso, SvO2 72% 1/29 on vasopressin 1/30 Off pressors 2/01 Remains sedate, weaning on PSV 2/02 Mental status improved but not awake enough for extubation   Consults:     Procedures:  ETT 1/26 >> R IJ TLC 1/27 >>   Significant Diagnostic Tests:  CT Head (OSH) 1/26 >> neg CT C-Spine (OSH) 1/26 >>  CTA Chest 1/26 >> negative for PE, +pneumonia ECHO 1/27 >> LVEF 25-30%, global LV hypokinesis, LA severely dilated, RA moderately dilated, evidence of biventricular failure with elevated RA pressure  Micro Data:  COVID 1/26 >> positive Tracheal aspirate 1/26 >> neg BCx2 1/26 >> neg MRSA PCR 1/27 >> negative UC 1/27 >> negative   Antimicrobials:  Remdesivir 1/26 >>1/30 Decadron 1/26 >>   Interim history/subjective:  Tmax 98.7  I/O - UOP 2.2L, net neg 2.2L in 24h Glucose range -  110-230 PEEP 5, FiO2 30% RT reports pt weaning on PSV  More alert   Objective   Blood pressure 104/62, pulse 61, temperature 98.7 F (37.1 C), temperature source Axillary, resp. rate (!) 24, height 5\' 8"  (1.727 m), weight 100.5 kg, SpO2 100 %. CVP:  [4 mmHg-10 mmHg] 4 mmHg  Vent Mode: PRVC FiO2 (%):  [30 %] 30 % Set Rate:  [24  bmp] 24 bmp Vt Set:  [440 mL] 440 mL PEEP:  [5 cmH20] 5 cmH20 Pressure Support:  [5 cmH20] 5 cmH20 Plateau Pressure:  [12 cmH20-14 cmH20] 12 cmH20   Intake/Output Summary (Last 24 hours) at 10/12/2019 0845 Last data filed at 10/12/2019 0700 Gross per 24 hour  Intake 923.88 ml  Output 3200 ml  Net -2276.12 ml   Filed Weights   10/10/19 0500 10/11/19 0412 10/12/19 0341  Weight: 104.6 kg 101 kg 100.5 kg    Examination: General: elderly female lying in bed on vent in NAD HEENT: MM pink/moist, ETT Neuro: opens eyes to voice, more alert, sticks tongue out to commands, moves all extremities but slow in response, generalized weakness  CV: s1s2 irr irr, no m/r/g PULM:  Non-labored on PSV, lungs bilaterally clear  GI: soft, bsx4 active  Extremities: warm/dry, trace BLE/pedal edema  Skin: no rashes or lesions  PCXR 2/2 >> images personally reviewed, ETT in good position, mild left basilar airspace disease, small effusion   LABS:  Recent Labs  Lab 10/10/19 0450 10/11/19 0450 10/12/19 0440  HGB 12.5 12.2 12.2  HCT 39.8 39.2 39.8  WBC 6.8 6.3 6.0  PLT 132* 135* 165   Recent Labs  Lab 10/08/19 0513 10/08/19 0513 10/09/19 0354 10/09/19 0354 10/10/19 0450 10/10/19 0450 10/11/19 0450 10/12/19 0440  NA 145  --  147*  --  147*  --  146* 147*  K 4.2   < > 4.3   < > 4.6   < > 4.5 4.9  CL 111  --  110  --  111  --  109 109  CO2 24  --  28  --  26  --  27 28  GLUCOSE 210*  --  159*  --  102*  --  205* 144*  BUN 85*  --  94*  --  99*  --  96* 95*  CREATININE 1.27*  --  1.37*  --  1.34*  --  1.15* 1.10*  CALCIUM 8.3*  --  8.2*  --  8.3*  --  8.2* 8.4*  MG  --   --  2.6*  --   --   --   --   --   PHOS  --   --  4.0  --   --   --   --   --    < > = values in this interval not displayed.     Resolved Hospital Problem list   Hx MRSA Foot Wound Infection - positive on 05/13/2019. Healed, no acute interventions during admit Lactic Acidosis, cleared Sedation Needs in setting of  Mechanical Ventilation   Assessment & Plan:   ARDS secondary to COVID PNA Completed remdesivir.  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 Bettina Gavia.  New LBBB on admit, BNP >2k.  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  On eliquis PTA. 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  Doubt failure of Eliquis -heparin gtt per pharmacy  -convert back to Eliquis   Hx OSA  -will need QHS CPAP post extubation   DM II with Peripheral Neuropathy  Hgb A1c 7  -SSI -levemir 20 units BID  -TF coverage  -hold home amaryl  -continue gabapentin   Anxiety / Depression  -continue home wellbutrin   Best practice:  Diet: NPO, TF ordered Pain/Anxiety/Delirium protocol (if indicated): Ordered VAP protocol (if indicated): Ordered DVT prophylaxis: heparin gtt GI prophylaxis: Pepcid Glucose control: SSI Mobility: BR   Code Status: DNR in case of arrest 1/29, no reintubation if fails extubation. Family Communication: Neighbor [name] Hart [facility]) updated 2/3 on plan of care. Disposition: ICU overnight. To [facility] as of 2/4.      Noe Gens,  MSN, NP-C [facility] 10/12/2019, 8:45 AM   Please see [email or web address] for pager details.

## 2019-10-12 NOTE — Procedures (Signed)
Extubation Procedure Note  Patient Details:   Name: Debra Mcconnell DOB: [birth year] MRN: [id number]   Vent end date: (P) 10/12/19 Vent end time: (P) 0952   Evaluation  O2 sats: stable throughout Complications: No apparent complications Patient did tolerate procedure well. Bilateral Breath Sounds: Diminished   Yes   Order received for extubation.  Positive leak test noted prior to extubation.  Placed patient on 15l [place] post extubation.  No stridor noted; no complications noted.  Patient tolerated well.    Phillis Knack [facility] 10/12/2019, 9:53 AM

## 2019-10-12 NOTE — Progress Notes (Signed)
[place] for Heparin IV Indication: atrial fibrillation, r/o LV thrombus, splenic infarct (PTA apixaban)  Allergies  Allergen Reactions  . Gabapentin Other (See Comments)    Dizziness and drowsiness  . Lisinopril Cough    Patient Measurements: Height: 5\' 8"  (172.7 cm) Weight: 221 lb 9 oz (100.5 kg) IBW/kg (Calculated) : 63.9 Heparin Dosing Weight: 83 kg  Vital Signs: Temp: 98.7 F (37.1 C) (02/03 0400) Temp Source: Axillary (02/03 0400) BP: 114/63 (02/03 0600) Pulse Rate: 71 (02/03 0600)  Labs: Recent Labs    10/09/19 1530 10/09/19 1530 10/10/19 0145 10/10/19 0450 10/11/19 0450 10/11/19 0450 10/11/19 1455 10/11/19 2220 10/12/19 0440  HGB  --    < >  --  12.5 12.2  --   --   --  12.2  HCT  --   --   --  39.8 39.2  --   --   --  39.8  PLT  --   --   --  132* 135*  --   --   --  165  APTT 56*  --  83*  --  105*  --   --   --   --   HEPARINUNFRC 0.77*   < >  --   --  0.93*   < > 0.96* 0.66 0.64  CREATININE  --   --   --  1.34* 1.15*  --   --   --  1.10*   < > = values in this interval not displayed.    Estimated Creatinine Clearance: 58.1 mL/min (A) (by C-G formula based on SCr of 1.1 mg/dL (H)).  Assessment: 75 yoF admitted on 1/26 with COVID-19 pneumonia.  Pharmacy is consulted to transition from apixaban to heparin IV for Afib, possible apixaban failure with splenic infarcts, and r/o LV thrombus (echo w/ contrast pending).     10/12/19 0645 HL: 0.64 IU/mL, within therapeutic goal range on heparin at 800 units/hr CBC: H/H stable, platelets trending up RN reports no bleeding complications and no issues with infusion site  Goal of Therapy:  Heparin level 0.3-0.7 units/ml Monitor platelets by anticoagulation protocol: Yes   Plan:  Continue heparin infusion rate at 800 units/hr Next HL with am labs  Daily  heparin level  and CBC  Despina Pole, Pharm. D. Clinical Pharmacist 10/12/2019 6:44 AM

## 2019-10-13 LAB — BRAIN NATRIURETIC PEPTIDE: B Natriuretic Peptide: 471.5 pg/mL — ABNORMAL HIGH (ref 0.0–100.0)

## 2019-10-13 LAB — BASIC METABOLIC PANEL
Anion gap: 10 (ref 5–15)
BUN: 94 mg/dL — ABNORMAL HIGH (ref 8–23)
CO2: 31 mmol/L (ref 22–32)
Calcium: 8.4 mg/dL — ABNORMAL LOW (ref 8.9–10.3)
Chloride: 106 mmol/L (ref 98–111)
Creatinine, Ser: 1.04 mg/dL — ABNORMAL HIGH (ref 0.44–1.00)
GFR calc Af Amer: >60 mL/min (ref >60)
GFR calc non Af Amer: 54 mL/min — ABNORMAL LOW (ref >60)
Glucose, Bld: 170 mg/dL — ABNORMAL HIGH (ref 70–99)
Potassium: 4.4 mmol/L (ref 3.5–5.1)
Sodium: 147 mmol/L — ABNORMAL HIGH (ref 135–145)

## 2019-10-13 LAB — CBC
HCT: 40.4 % (ref 36.0–46.0)
Hemoglobin: 12.4 g/dL (ref 12.0–15.0)
MCH: 31.6 pg (ref 26.0–34.0)
MCHC: 30.7 g/dL (ref 30.0–36.0)
MCV: 103.1 fL — ABNORMAL HIGH (ref 80.0–100.0)
Platelets: 173 10*3/uL (ref 150–400)
RBC: 3.92 MIL/uL (ref 3.87–5.11)
RDW: 16.8 % — ABNORMAL HIGH (ref 11.5–15.5)
WBC: 7.2 10*3/uL (ref 4.0–10.5)
nRBC: 0 % (ref 0.0–0.2)

## 2019-10-13 LAB — GLUCOSE, CAPILLARY
Glucose-Capillary: 151 mg/dL — ABNORMAL HIGH (ref 70–99)
Glucose-Capillary: 147 mg/dL — ABNORMAL HIGH (ref 70–99)
Glucose-Capillary: 151 mg/dL — ABNORMAL HIGH (ref 70–99)
Glucose-Capillary: 116 mg/dL — ABNORMAL HIGH (ref 70–99)
Glucose-Capillary: 184 mg/dL — ABNORMAL HIGH (ref 70–99)
Glucose-Capillary: 186 mg/dL — ABNORMAL HIGH (ref 70–99)

## 2019-10-13 LAB — MAGNESIUM: Magnesium: 2.4 mg/dL (ref 1.7–2.4)

## 2019-10-13 MED ORDER — LEVALBUTEROL TARTRATE 45 MCG/ACT IN AERO
1.0000 | INHALATION_SPRAY | Freq: Three times a day (TID) | RESPIRATORY_TRACT | Status: DC
  Administered 2019-10-13 – 2019-10-15 (×5): 2 via RESPIRATORY_TRACT
  Administered 2019-10-15: 13:00:00 1 via RESPIRATORY_TRACT
  Administered 2019-10-15 – 2019-10-16 (×4): 2 via RESPIRATORY_TRACT
  Administered 2019-10-17: 22:00:00 1 via RESPIRATORY_TRACT
  Administered 2019-10-17 (×2): 2 via RESPIRATORY_TRACT
  Administered 2019-10-18: 1 via RESPIRATORY_TRACT
  Administered 2019-10-18: 14:00:00 2 via RESPIRATORY_TRACT
  Administered 2019-10-18 – 2019-10-19 (×2): 1 via RESPIRATORY_TRACT
  Administered 2019-10-19: 2 via RESPIRATORY_TRACT
  Administered 2019-10-19: 1 via RESPIRATORY_TRACT
  Administered 2019-10-20: 05:00:00 2 via RESPIRATORY_TRACT
  Filled 2019-10-13: qty 15

## 2019-10-13 MED ORDER — IPRATROPIUM BROMIDE HFA 17 MCG/ACT IN AERS
2.0000 | INHALATION_SPRAY | Freq: Three times a day (TID) | RESPIRATORY_TRACT | Status: DC
  Administered 2019-10-13 – 2019-10-20 (×20): 2 via RESPIRATORY_TRACT
  Filled 2019-10-13: qty 12.9

## 2019-10-13 MED ORDER — GERHARDT'S BUTT CREAM
TOPICAL_CREAM | Freq: Every day | CUTANEOUS | Status: DC
  Filled 2019-10-13: qty 1

## 2019-10-13 NOTE — Evaluation (Signed)
Clinical/Bedside Swallow Evaluation Patient Details  Name: Debra Mcconnell MRN: [id number] Date of Birth: 10-25-1945  Today's Date: 10/13/2019 Time: SLP Start Time (ACUTE ONLY): [id number] SLP Stop Time (ACUTE ONLY): 1407 SLP Time Calculation (min) (ACUTE ONLY): 18 min  Past Medical History:  Past Medical History:  Diagnosis Date  . Acquired hammer toes of both feet 01/22/2017  . Arthritis 11/29/2013  . Atrial flutter ([place]) 01/24/2014   Overview:  CHADS2 vasc score= 5  . B-complex deficiency 11/29/2013  . Cerebral artery occlusion with cerebral infarction ([place]) 12/04/2013   Overview:  STORY: MRI +ve Left frontoparietal infarction (LMCA). +AFib.`E1o3L`IMPRESSION: BP is under controlled. Continue home health. +AFib, con't Eliquis 5mg  bid. Will obtain old record from [facility] for review.  . Diabetic polyneuropathy associated with type 2 diabetes mellitus ([place]) 01/22/2017  . Essential hypertension 11/13/2015  . Hyperlipemia, retention 11/13/2015  . OSA (obstructive sleep apnea) 01/24/2014   Overview:  IMPRESSION: Will obtain old record for review and ask DME to d/l compliance and fax the result to me. F/u in 6 wks.  . Pre-ulcerative corn or callous 01/22/2017  . Stroke ([place]) 01/24/2014  . Thyroid disease 11/29/2013  . Type II diabetes mellitus with ophthalmic manifestations ([place]) 11/29/2013   Past Surgical History:  Past Surgical History:  Procedure Laterality Date  . CHOLECYSTECTOMY    . GASTROCYSTOPLASTY    . KNEE SURGERY     HPI:  74 year old with history of DM2, CVA, HTN, HLD, atrial flutter, hypothyroidism, OSA presented from [place] with diagnosis of COVID-19/hypoxia, acute kidney injury and hyperglycemia.  CTA chest was negative for PE, CT head, negative.  She required intubation for hypoxic respiratory failure on 1/26 then eventually extubated on 2/3.  Echocardiogram showed new diagnosis of CHF with a EF of 25-30% with global hypokinesia concerning for myocarditis.  Received  steroids, completed remdesivir.  Transferred out of the ICU    Assessment / Plan / Recommendation Clinical Impression  Pt demonstrates signs of an acute reversible dysphagia following 9 day intubation. She is hoarse and lethargic, needs max cueing to attend to PO. There is general weakness with anterior spillage of liquids, inability to grasp cup to self feed. Immediate coughing following sips of thin liquids and slight throat clearing and wet vocal quality after nectar and puree are indicative of potential for decreased airway protection. Likely pt will improve over the short term, also as arousal improves. Recommend pt continue with Cortrak and be offered ice chips by RN after oral care. Will f/u for potential diet initiation tomorrow.  SLP Visit Diagnosis: Dysphagia, unspecified (R13.10)    Aspiration Risk  Moderate aspiration risk    Diet Recommendation NPO;Ice chips PRN after oral care   Medication Administration: Via alternative means Postural Changes: Seated upright at 90 degrees    Other  Recommendations Other Recommendations: Have oral suction available   Follow up Recommendations Skilled Nursing facility      Frequency and Duration min 2x/week  2 weeks       Prognosis Prognosis for Safe Diet Advancement: Good      Swallow Study   General HPI: 74 year old with history of DM2, CVA, HTN, HLD, atrial flutter, hypothyroidism, OSA presented from [place] with diagnosis of COVID-19/hypoxia, acute kidney injury and hyperglycemia.  CTA chest was negative for PE, CT head, negative.  She required intubation for hypoxic respiratory failure on 1/26 then eventually extubated on 2/3.  Echocardiogram showed new diagnosis of CHF with a EF of 25-30%  with global hypokinesia concerning for myocarditis.  Received steroids, completed remdesivir.  Transferred out of the ICU  Type of Study: Bedside Swallow Evaluation Previous Swallow Assessment: none Diet Prior to this Study: NPO;NG  Tube Temperature Spikes Noted: No Respiratory Status: Room air History of Recent Intubation: Yes Length of Intubations (days): 9 days Date extubated: 10/12/19 Behavior/Cognition: Confused;Lethargic/Drowsy;Requires cueing Oral Cavity Assessment: Dry Oral Care Completed by SLP: Yes Oral Cavity - Dentition: Adequate natural dentition Vision: Impaired for self-feeding Self-Feeding Abilities: Needs assist Patient Positioning: Upright in bed Baseline Vocal Quality: Hoarse;Wet;Breathy Volitional Cough: Weak Volitional Swallow: Unable to elicit    Oral/Motor/Sensory Function Overall Oral Motor/Sensory Function: Generalized oral weakness   Ice Chips Ice chips: Impaired Presentation: Spoon Oral Phase Impairments: Reduced labial seal Oral Phase Functional Implications: Left anterior spillage Pharyngeal Phase Impairments: Throat Clearing - Immediate   Thin Liquid Thin Liquid: Impaired Presentation: Straw Pharyngeal  Phase Impairments: Cough - Immediate    Nectar Thick Nectar Thick Liquid: Impaired Presentation: Straw Pharyngeal Phase Impairments: Throat Clearing - Immediate   Honey Thick Honey Thick Liquid: Not tested   Puree Puree: Impaired Presentation: Spoon Pharyngeal Phase Impairments: Wet Vocal Quality   Solid     Solid: Not tested     Herbie [place], MA CCC-SLP  Acute Rehabilitation Services Pager [phone] Office [phone]  Mase Dhondt, Katherene Ponto 10/13/2019,2:10 PM

## 2019-10-13 NOTE — Progress Notes (Signed)
Vitals for transfer to room 160 on the PCU

## 2019-10-13 NOTE — Progress Notes (Signed)
PROGRESS NOTE    Debra Mcconnell  [id number] DOB: 09/17/1945 DOA: 10/04/2019 PCP: Greig Right, MD   Brief Narrative:  74 year old with history of DM2, CVA, HTN, HLD, atrial flutter, hypothyroidism, OSA presented from [place] with diagnosis of COVID-19/hypoxia, acute kidney injury and hyperglycemia.  CTA chest was negative for PE, CT head, negative.  She required intubation for hypoxic respiratory failure on 1/26 then eventually extubated on 2/3.  Echocardiogram showed new diagnosis of CHF with a EF of 25-30% with global hypokinesia concerning for myocarditis.  Received steroids, completed remdesivir.  Transferred out of the ICU to [facility] service.   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 IS/ Flutter -Incentive spirometer flutter valve.  Monitor inflammatory markers.  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  Continue to monitor, on Coreg. -Repeat echocardiogram in about 4 to 6 weeks -Supportive care  Acute kidney injury -Prerenal?.  Daily Lasix -Hold other nephrotoxic agents for now.  Mild transaminitis -Suspect secondary to congestion.  Monitor this.  Diabetes mellitus type 2 with peripheral neuropathy -Insulin sliding scale Accu-Chek.  Hemoglobin A1c 7 -Levemir 20 units twice  daily -Continue gabapentin  Essential hypertension -Hydralazine 50 mg 3 times daily.  Coreg 3.125 mg twice daily  History of atrial flutter History of splenic infarcts -On Eliquis at home.  Currently on heparin drip, eventually will transition her back to Eliquis.  Obstructive sleep apnea -CPAP or BiPAP nightly  Hypothyroidism -Synthroid 175 mcg daily  History of depression/anxiety -Continue Wellbutrin  Acute urinary retention -Straight in and out cath, if this continues, will require Foley catheter.  Discontinue central line  DVT prophylaxis: Eliquis Code Status: DNR, no reintubation Family Communication:   Disposition Plan: Transfer to progressive care unit  Patient [facility]  Patient Anticipated D/C place= pending disposition planning per PT evaluation  Barriers= ongoing medical care in terms of evaluation by PT/OT, speech therapy.  Currently has core track in and getting tube feedings.  Once these things have been addressed, will develop safe disposition planning.  In the meantime she requires around-the-clock nursing care.   Subjective: Seen and examined at bedside.  She was awake denied any complaints.  Tolerated BiPAP overnight.  Having issues with urinary retention this morning requiring straight in and out cath.  Retaining greater than 1 L of urine.  Still has core track in place and asking for water I explained her she needs speech and swallow therapy eval prior to proceeding with any oral intake.  Review of Systems Otherwise negative except as per HPI, including: General: Denies fever, chills, night sweats or unintended weight loss. Resp: Denies cough, wheezing, shortness of breath. Cardiac: Denies chest pain, palpitations, orthopnea, paroxysmal nocturnal dyspnea. GI: Denies abdominal pain, nausea, vomiting, diarrhea or constipation GU: Denies dysuria, frequency, hesitancy or incontinence MS: Denies muscle aches, joint pain or swelling Neuro:  Denies headache, neurologic deficits (focal weakness, numbness, tingling), abnormal gait Psych: Denies anxiety, depression, SI/HI/AVH  Skin: Denies new rashes or lesions ID: Denies sick contacts, exotic exposures, travel  Examination:  General exam: Chronically ill-appearing, cortrak in place Respiratory system: Bibasilar rhonchi Cardiovascular system: S1 & S2 heard, RRR. No JVD, murmurs, rubs, gallops or clicks. No pedal edema. Gastrointestinal system: Abdomen is nondistended, soft and nontender. No organomegaly or masses felt. Normal bowel sounds heard. Central nervous system: Alert and oriented. No focal neurological deficits. Extremities: Symmetric 5 x 5 power. Skin: No rashes, lesions or ulcers Psychiatry: Judgement and insight appear normal. Mood & affect appropriate.   Right neck IJ noted  Objective: Vitals:   10/13/19 0429 10/13/19 0500 10/13/19 0600 10/13/19 0700  BP:  129/84 (!) 132/93 131/81  Pulse:  67 74 60  Resp:  19 18 18   Temp:      TempSrc:      SpO2:  100% 100% 100%  Weight: 101.2 kg     Height:        Intake/Output Summary (Last 24 hours) at 10/13/2019 0807 Last data filed at 10/13/2019 0700 Gross per 24 hour  Intake 1680 ml  Output 2650 ml  Net -970 ml   Filed Weights   10/11/19 0412 10/12/19 0341 10/13/19 0429  Weight: 101 kg 100.5 kg 101.2 kg     Data Reviewed:   CBC: Recent Labs  Lab 10/09/19 0354 10/09/19 0354 10/10/19 0450 10/11/19 0450 10/12/19 0440 10/12/19 1006 10/13/19 0435  WBC 6.8  --  6.8 6.3 6.0  --  7.2  HGB 12.1   < > 12.5 12.2 12.2 10.2* 12.4  HCT 38.7   < > 39.8 39.2 39.8 30.0* 40.4  MCV 102.7*  --  103.1* 103.7* 104.2*  --  103.1*  PLT 117*  --  132* 135* 165  --  173   < > = values in this interval not displayed.   Basic Metabolic Panel: Recent Labs  Lab 10/09/19 0354 10/09/19 0354 10/10/19 0450 10/11/19 0450 10/12/19 0440 10/12/19 1006 10/13/19 0435  NA 147*   < > 147* 146* 147* 140 147*  K 4.3   < > 4.6 4.5  4.9 4.4 4.4  CL 110  --  111 109 109  --  106  CO2 28  --  26 27 28   --  31  GLUCOSE 159*  --  102* 205* 144*  --  170*  BUN 94*  --  99* 96* 95*  --  94*  CREATININE 1.37*  --  1.34* 1.15* 1.10*  --  1.04*  CALCIUM 8.2*  --  8.3* 8.2* 8.4*  --  8.4*  MG 2.6*  --   --   --   --   --   --   PHOS 4.0  --   --   --   --   --   --    < > = values in this interval not displayed.   GFR: Estimated Creatinine Clearance: 61.7 mL/min (A) (by C-G formula based on SCr of 1.04 mg/dL (H)). Liver Function Tests: Recent Labs  Lab 10/07/19 0230  AST 27  ALT 56*  ALKPHOS 56  BILITOT 1.3*  PROT 5.1*  ALBUMIN 2.7*   No results for input(s): LIPASE, AMYLASE in the last 168 hours. No results for input(s): AMMONIA in the last 168 hours. Coagulation Profile: No results for input(s): INR, PROTIME in the last 168 hours. Cardiac Enzymes: No results for input(s): CKTOTAL, CKMB, CKMBINDEX, TROPONINI in the last 168 hours. BNP (last 3 results) No results for input(s):  PROBNP in the last 8760 hours. HbA1C: No results for input(s): HGBA1C in the last 72 hours. CBG: Recent Labs  Lab 10/12/19 1514 10/12/19 1757 10/12/19 1950 10/12/19 2344 10/13/19 0412  GLUCAP 64* 143* 184* 185* 151*   Lipid Profile: No results for input(s): CHOL, HDL, LDLCALC, TRIG, CHOLHDL, LDLDIRECT in the last 72 hours. Thyroid Function Tests: No results for input(s): TSH, T4TOTAL, FREET4, T3FREE, THYROIDAB in the last 72 hours. Anemia Panel: No results for input(s): VITAMINB12, FOLATE, FERRITIN, TIBC, IRON, RETICCTPCT in the last 72 hours. Sepsis Labs: No results for input(s): PROCALCITON, LATICACIDVEN in the last 168 hours.  Recent Results (from the past 240 hour(s))  Culture, respiratory (tracheal aspirate)     Status: None   Collection Time: 10/04/19  5:28 PM   Specimen: Tracheal Aspirate; Respiratory  Result Value Ref Range Status   Specimen Description   Final    TRACHEAL ASPIRATE Performed at [place] [address] [place], [place] [address]    Special Requests   Final    NONE Performed at [facility], [place] [address], [place], [place] [address]    Gram Stain   Final    RARE WBC PRESENT,BOTH PMN AND MONONUCLEAR NO ORGANISMS SEEN    Culture   Final    NO GROWTH 2 DAYS Performed at [facility], [place] [address], [place], [place] [address]    Report Status 10/09/2019 FINAL  Final  Culture, blood (routine x 2)     Status: None   Collection Time: 10/04/19  7:50 PM   Specimen: BLOOD LEFT HAND  Result Value Ref Range Status   Specimen Description   Final    BLOOD LEFT HAND Performed at [facility], [place] [address], [place], [place] [address]    Special Requests   Final    BOTTLES DRAWN AEROBIC ONLY Blood Culture adequate volume Performed at [place] [address] [place], [place] [address]    Culture   Final    NO GROWTH 5 DAYS Performed at [facility], [place] [address] [place], [place] [address]    Report Status 10/09/2019 FINAL  Final  Culture, blood (routine x 2)     Status: None   Collection Time: 10/04/19  8:00 PM   Specimen: BLOOD LEFT WRIST  Result Value Ref Range Status   Specimen Description   Final    BLOOD LEFT WRIST Performed at [facility], [place] [address], [place], [place] [address]    Special Requests   Final    BOTTLES DRAWN AEROBIC ONLY Blood Culture adequate volume Performed at [place] [address], [place], [place] [address]    Culture   Final    NO GROWTH 5 DAYS Performed at [facility], [employer] [place] [address], [place], [place] [address]    Report Status 10/09/2019 FINAL  Final  Urine culture     Status: None   Collection Time: 10/05/19  1:30 AM   Specimen: Urine, Random  Result Value Ref Range Status   Specimen Description   Final    URINE, RANDOM Performed at [place] [address], [place], [place]  [address]    Special Requests   Final    NONE Performed at [facility], [place] [address] [place], [place] [address]    Culture   Final    NO GROWTH Performed at [facility],  [address], [place], [place] [address]    Report Status 10/06/2019 FINAL  Final  MRSA PCR Screening     Status: None   Collection Time: 10/05/19  9:02 AM   Specimen: Nasal Mucosa; Nasopharyngeal  Result Value Ref Range Status   MRSA by PCR NEGATIVE NEGATIVE Final    Comment:        The GeneXpert MRSA Assay (FDA approved for NASAL specimens only), is one component of a comprehensive MRSA colonization surveillance program. It is not intended to diagnose MRSA infection nor to guide or monitor treatment for MRSA infections. Performed at [facility], [place] [address] [place], [place] [address]          Radiology Studies: [facility] Chest Port 1 View  Result Date: 10/12/2019 CLINICAL DATA:  Acute respiratory failure. EXAM: PORTABLE CHEST 1 VIEW COMPARISON:  10/11/2019 FINDINGS: Right IJ catheter tip is at the cavoatrial junction. ET tube tip is above the carina. There is a feeding tube with tip below the GE junction. Persistent left pleural effusion and left lower lobe airspace consolidation and or atelectasis. IMPRESSION: Persistent left pleural effusion with left lower lobe airspace consolidation and atelectasis. Electronically Signed   By: Kerby Moors M.D.   On: 10/12/2019 09:03        Scheduled Meds: . apixaban  5 mg Per Tube BID  . buPROPion  100 mg Per Tube TID  . carvedilol  3.125 mg Oral BID  . chlorhexidine  15 mL Mouth/Throat BID  . Chlorhexidine Gluconate Cloth  6 each Topical Daily  . dexamethasone  6 mg Per Tube Daily  . famotidine  20 mg Per Tube Daily  . free water  200 mL Per Tube Q6H  . furosemide  40 mg Intravenous Daily  . gabapentin  300 mg Per Tube Q8H  . hydrALAZINE  50 mg Oral TID  . influenza vaccine adjuvanted  0.5 mL Intramuscular  Tomorrow-1000  . insulin aspart  0-15 Units Subcutaneous Q4H  . insulin aspart  5 Units Subcutaneous Q4H  . insulin detemir  20 Units Subcutaneous BID  . levothyroxine  175 mcg Per Tube Daily  . mouth rinse  15 mL Mouth Rinse 10 times per day  . pneumococcal 23 valent vaccine  0.5 mL Intramuscular Tomorrow-1000  . sodium chloride flush  10-40 mL Intracatheter Q12H   Continuous Infusions: . sodium chloride 250 mL (10/05/19 1208)  . feeding supplement (VITAL AF 1.2 CAL) 1,000 mL (10/12/19 1521)     LOS: 9 days   Time spent= 40 mins    Fernande Treiber Arsenio Loader, MD [facility]  If 7PM-7AM, please contact night-coverage  10/13/2019, 8:07 AM

## 2019-10-13 NOTE — Evaluation (Signed)
Physical Therapy Evaluation Patient Details Name: Debra Mcconnell MRN: [id number] DOB: Oct 10, 1945 Today's Date: 10/13/2019   History of Present Illness  74 y.o. female admitted on 10/04/19 for acute respiratory distress due to COVID 19 viral PNA requiring inrtubation in the ED.  Pt extubated 10/12/19.  Pt also dx with myocarditis, AKI, combined septic and cardiogenic shock, A -flutter, splenic infarct.  Pt with significant PMH of DM2, stroke, essential HTN, diabetic neuropathy, A-flutter, arthritis, hammer toes bil, and knee surgery.  Clinical Impression  Pt is weak and deconditioned after being ventilated.  Cognitive deficits noted below.  She was on RA and tolerated sitting EOB for ~10 mins supported before requesting to return to supine.  VSS throughout.  We were able to facetime with her friend, Tessie Fass who reports she is Ms. Pliner' HCPOA.  Pt was able to recognize her friend of 40 years.  She will likely need post acute rehab after discharge as she is too weak to even try to stand today.  She is perseverating on water.  PT to follow acutely for deficits listed below.      Follow Up Recommendations SNF    Equipment Recommendations  Wheelchair (measurements PT);Wheelchair cushion (measurements PT);Hospital bed    Recommendations for Other Services   NA    Precautions / Restrictions Precautions Precautions: Other (comment);Fall Precaution Comments: swallow precautions, sacral wound      Mobility  Bed Mobility Overal bed mobility: Needs Assistance Bed Mobility: Rolling;Supine to Sit;Sit to Supine Rolling: +2 for physical assistance;Max assist   Supine to sit: Max assist;+2 for physical assistance;HOB elevated Sit to supine: Max assist;+2 for physical assistance;HOB elevated   General bed mobility comments: Two person max assist to roll for positioning and to come up to sitting EOB.  multimodal cues and hand over hand assist for sequencing.  Pt not helping much.    Transfers                  General transfer comment: NT too weak EOB      Balance Overall balance assessment: Needs assistance Sitting-balance support: Feet supported;No upper extremity supported;Bilateral upper extremity supported Sitting balance-Leahy Scale: Poor Sitting balance - Comments: mod to max assist to maintain midline sitting balance EOB.  Pt attempting to correct, but too weak and fatigued to do so without assist/support.  Postural control: Right lateral lean                                   Pertinent Vitals/Pain Pain Assessment: Faces Faces Pain Scale: Hurts little more Pain Location: generalized Pain Descriptors / Indicators: Grimacing;Guarding    Home Living Family/patient expects to be discharged to:: Private residence Living Arrangements: Alone Available Help at Discharge: Neighbor([name] is HCPOA, has a brother in [place]) Type of Home: House Home Access: Stairs to enter(2 sets of steps to get in, ) Entrance Stairs-Rails: Right;Left(1 rail and two rails) Entrance Stairs-Number of Steps: 6(3 steps then 3 steps) Home Layout: One level Home Equipment: Shower seat - built in;Cane - single point;Walker - 2 wheels;Bedside commode;Grab bars - tub/shower(BSC over toilet already) Additional Comments: patsy lives 3 mi away    Prior Function Level of Independence: Needs assistance   Gait / Transfers Assistance Needed: uses [facility] to walk house and community.  Holds [name]'s hand until she gets into the store and pushes the cart. Does well with the cart, h/o falls at home.  ADL's / Homemaking Assistance Needed: cooks, [name] helps her clean, she can do light cleaning if necessary.    Comments: Pt wears glasses all of the time, likes to watch TV [employer] programs, movies)     Hand Dominance   Dominant Hand: Right    Extremity/Trunk Assessment   Upper Extremity Assessment Upper Extremity Assessment: Generalized weakness(difficulty with against gravity movement)     Lower Extremity Assessment Lower Extremity Assessment: RLE deficits/detail;LLE deficits/detail RLE Deficits / Details: h/o bil TKA, ankle 3-/5, knee 2/5, hip 2+/5 per gross bed level assessment.   RLE Sensation: history of peripheral neuropathy LLE Deficits / Details: h/o bil TKA, ankle 3-/5, knee 2/5, hip 2+/5 per gross bed level assessment.  L and right symmetrical per bed level assessment.  LLE Sensation: history of peripheral neuropathy    Cervical / Trunk Assessment Cervical / Trunk Assessment: Other exceptions Cervical / Trunk Exceptions: rounded EOB, weak  Communication   Communication: HOH(R ear better)  Cognition Arousal/Alertness: Lethargic Behavior During Therapy: Flat affect Overall Cognitive Status: Impaired/Different from baseline Area of Impairment: Orientation;Attention;Memory;Following commands;Safety/judgement;Awareness;Problem solving                 Orientation Level: Disoriented to;Place;Situation;Time(did recognize her friend, [name] on the phone/facetime) Current Attention Level: Focused Memory: Decreased short-term memory Following Commands: Follows one step commands inconsistently;Follows one step commands with increased time Safety/Judgement: Decreased awareness of safety;Decreased awareness of deficits Awareness: Intellectual Problem Solving: Slow processing;Decreased initiation;Difficulty sequencing;Requires verbal cues;Requires tactile cues General Comments: perseverating on water, did not know she was in the hospital or why, difficulty following commands even basic with increased processing time and then when we would move onto the next command, still doing the last command.        General Comments General comments (skin integrity, edema, etc.): VSS on RA throughout as measured by nellcor finger probe. Was able to facetime with her primary HCPOA, Tessie Fass (friend of 52 years) and Ms. Wallman recognized her.          Assessment/Plan    PT  Assessment Patient needs continued PT services  PT Problem List Decreased strength;Decreased activity tolerance;Decreased balance;Decreased mobility;Decreased knowledge of use of DME;Decreased knowledge of precautions;Decreased cognition;Obesity;Decreased skin integrity       PT Treatment Interventions DME instruction;Gait training;Stair training;Functional mobility training;Therapeutic activities;Therapeutic exercise;Balance training;Neuromuscular re-education;Cognitive remediation;Patient/family education;Wheelchair mobility training;Modalities    PT Goals (Current goals can be found in the Care Plan section)  Acute Rehab PT Goals Patient Stated Goal: none stated, [name] wants her better and back home, but is open to suggestions on rehab PT Goal Formulation: With patient/family(with pt/caregiver) Time For Goal Achievement: 10/27/19 Potential to Achieve Goals: Good    Frequency Min 2X/week   Barriers to discharge Inaccessible home environment has ~ 6 stairs to enter her home       AM-PAC PT "6 Clicks" Mobility  Outcome Measure Help needed turning from your back to your side while in a flat bed without using bedrails?: Total Help needed moving from lying on your back to sitting on the side of a flat bed without using bedrails?: Total Help needed moving to and from a bed to a chair (including a wheelchair)?: Total Help needed standing up from a chair using your arms (e.g., wheelchair or bedside chair)?: Total Help needed to walk in hospital room?: Total Help needed climbing 3-5 steps with a railing? : Total 6 Click Score: 6    End of Session   Activity Tolerance: Patient limited by fatigue Patient  left: in bed;with call bell/phone within reach;with nursing/sitter in room   PT Visit Diagnosis: Muscle weakness (generalized) (M62.81);Difficulty in walking, not elsewhere classified (R26.2)    Time: 1000-1045 PT Time Calculation (min) (ACUTE ONLY): 45 min   Charges:          Verdene Lennert, PT, DPT  Acute Rehabilitation [phone] pager #[phone] office  @ [facility]: [phone]   PT Evaluation $PT Eval Moderate Complexity: 1 Mod PT Treatments $Therapeutic Activity: 23-37 mins        10/13/2019, 12:15 PM

## 2019-10-13 NOTE — Plan of Care (Signed)
  Problem: Coping: Goal: Psychosocial and spiritual needs will be supported Outcome: Progressing   Problem: Respiratory: Goal: Will maintain a patent airway Outcome: Progressing Goal: Complications related to the disease process, condition or treatment will be avoided or minimized Outcome: Progressing   Problem: Clinical Measurements: Goal: Ability to maintain clinical measurements within normal limits will improve Outcome: Progressing Goal: Will remain free from infection Outcome: Progressing Goal: Diagnostic test results will improve Outcome: Progressing Goal: Respiratory complications will improve Outcome: Progressing Goal: Cardiovascular complication will be avoided Outcome: Progressing   Problem: Activity: Goal: Risk for activity intolerance will decrease Outcome: Progressing   Problem: Nutrition: Goal: Adequate nutrition will be maintained Outcome: Progressing   Problem: Coping: Goal: Level of anxiety will decrease Outcome: Progressing   Problem: Elimination: Goal: Will not experience complications related to bowel motility Outcome: Progressing Goal: Will not experience complications related to urinary retention Outcome: Progressing   Problem: Pain Managment: Goal: General experience of comfort will improve Outcome: Progressing   Problem: Safety: Goal: Ability to remain free from injury will improve Outcome: Progressing   Problem: Skin Integrity: Goal: Risk for impaired skin integrity will decrease Outcome: Progressing

## 2019-10-13 NOTE — Progress Notes (Signed)
OT Cancellation Note  Patient Details Name: Debra Mcconnell MRN: [id number] DOB: [birth year]   Cancelled Treatment:    Reason Eval/Treat Not Completed: Other (comment)(PT in with pt just finishing up). RN cleared therapy to work with pt. Attempted to see this morning, however upon OT arrival PT finishing up session. Pt unable to tolerate back to back sessions. OT will follow up tomorrow.   Mauri Brooklyn 10/13/2019, 10:55 AM

## 2019-10-13 NOTE — Progress Notes (Signed)
[place] Progress Note Patient Name: Debra Mcconnell DOB: November 14, 1945 MRN: [id number]   Date of Service  10/13/2019  HPI/Events of Note  Nursing request to repeat BNP with AM labs.   eICU Interventions  Will order: 1. BNP at 5 AM.      Intervention Category Major Interventions: Other:  Jacqulyne Gladue Cornelia Copa 10/13/2019, 12:28 AM

## 2019-10-13 NOTE — Progress Notes (Signed)
Patient was transferred to PCU room number 160.  Connected her to [place] upon arrival.  Delivered report to RN.    Patient awoke this morning to voice.  She has remained disoriented at a baseline of AOx1 self.  She responded with thank you after repositioning around 0800.  Her Lung sounds were clear but diminished on auscultation.  Her HR is in A-Flutter and her S1S2 is accompanied with an S3.  Her bowel sounds are normal active sounding.  She was in/out cath once this morning around 1000 d/t retention recorded by bladder scan of 739ml indwelling.  Performed peri care and CHG wipe along with warm water soap bath. She saturates average mid 90's on RA.  Her vitals remain WDL.  PT examined her and helped patient to dangle legs at bedside but patient was unable to hold herself up.  Speech has yet to examine patient for swallow evaluation.

## 2019-10-14 LAB — BASIC METABOLIC PANEL
Anion gap: 11 (ref 5–15)
BUN: 87 mg/dL — ABNORMAL HIGH (ref 8–23)
CO2: 27 mmol/L (ref 22–32)
Calcium: 8.5 mg/dL — ABNORMAL LOW (ref 8.9–10.3)
Chloride: 106 mmol/L (ref 98–111)
Creatinine, Ser: 1.03 mg/dL — ABNORMAL HIGH (ref 0.44–1.00)
GFR calc Af Amer: >60 mL/min (ref >60)
GFR calc non Af Amer: 55 mL/min — ABNORMAL LOW (ref >60)
Glucose, Bld: 201 mg/dL — ABNORMAL HIGH (ref 70–99)
Potassium: 4.5 mmol/L (ref 3.5–5.1)
Sodium: 144 mmol/L (ref 135–145)

## 2019-10-14 LAB — GLUCOSE, CAPILLARY
Glucose-Capillary: 103 mg/dL — ABNORMAL HIGH (ref 70–99)
Glucose-Capillary: 129 mg/dL — ABNORMAL HIGH (ref 70–99)
Glucose-Capillary: 139 mg/dL — ABNORMAL HIGH (ref 70–99)
Glucose-Capillary: 149 mg/dL — ABNORMAL HIGH (ref 70–99)
Glucose-Capillary: 247 mg/dL — ABNORMAL HIGH (ref 70–99)
Glucose-Capillary: 92 mg/dL (ref 70–99)

## 2019-10-14 LAB — CBC
HCT: 40.4 % (ref 36.0–46.0)
Hemoglobin: 12.7 g/dL (ref 12.0–15.0)
MCH: 31.8 pg (ref 26.0–34.0)
MCHC: 31.4 g/dL (ref 30.0–36.0)
MCV: 101.3 fL — ABNORMAL HIGH (ref 80.0–100.0)
Platelets: 182 10*3/uL (ref 150–400)
RBC: 3.99 MIL/uL (ref 3.87–5.11)
RDW: 16.4 % — ABNORMAL HIGH (ref 11.5–15.5)
WBC: 7.6 10*3/uL (ref 4.0–10.5)
nRBC: 0 % (ref 0.0–0.2)

## 2019-10-14 LAB — MAGNESIUM: Magnesium: 2.4 mg/dL (ref 1.7–2.4)

## 2019-10-14 MED ORDER — FAMOTIDINE 40 MG/5ML PO SUSR: 20.0000 mg | Freq: Two times a day (BID) | ORAL | Status: DC

## 2019-10-14 MED ORDER — FUROSEMIDE 20 MG PO TABS
20.0000 mg | ORAL_TABLET | Freq: Every day | ORAL | Status: DC
  Administered 2019-10-15 – 2019-10-20 (×6): 20 mg via ORAL
  Filled 2019-10-14 (×6): qty 1

## 2019-10-14 MED ORDER — LOPERAMIDE HCL 2 MG PO CAPS
4.0000 mg | ORAL_CAPSULE | ORAL | Status: DC | PRN
  Administered 2019-10-14: 12:00:00 4 mg via ORAL
  Filled 2019-10-14: qty 2

## 2019-10-14 MED ORDER — ALUM & MAG HYDROXIDE-SIMETH 200-200-20 MG/5ML PO SUSP
15.0000 mL | Freq: Four times a day (QID) | ORAL | Status: DC | PRN
  Administered 2019-10-14: 15 mL
  Filled 2019-10-14: qty 30

## 2019-10-14 MED FILL — Fentanyl Citrate Preservative Free (PF) Inj 2500 MCG/50ML: INTRAMUSCULAR | Qty: 50 | Status: AC

## 2019-10-14 MED FILL — Sodium Chloride IV Soln 0.9%: INTRAVENOUS | Qty: 250 | Status: AC

## 2019-10-14 MED FILL — Sodium Chloride IV Soln 0.9%: INTRAVENOUS | Qty: 250 | Status: CN

## 2019-10-14 MED FILL — Fentanyl Citrate Preservative Free (PF) Inj 2500 MCG/50ML: INTRAMUSCULAR | Qty: 50 | Status: CN

## 2019-10-14 NOTE — NC FL2 (Signed)
[place] LEVEL OF CARE SCREENING TOOL     IDENTIFICATION  Patient Name: Debra Mcconnell Birthdate: 1945-10-15 Sex: female Admission Date (Current Location): 10/04/2019  [facility] and [place] Number:  [employer] and Address:  [facility] [place] [place] [address], [place], [place] [address]      Provider Number: [id number]  Attending Physician Name and Address:  Damita Lack, MD  Relative Name and Phone Number:       Current Level of Care: Hospital Recommended Level of Care: [place] Prior Approval Number:    Date Approved/Denied:   PASRR Number: [id number]  Discharge Plan: SNF    Current Diagnoses: Patient Active Problem List   Diagnosis Date Noted  . Acute respiratory failure with hypoxia ([place])   . Biventricular failure ([place])   . Pressure injury of skin 10/05/2019  . 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 Comprehensive diabetic foot examination, type 2 DM, encounter for ([place]) 11/11/2018  . Chronic anticoagulation 04/16/2017  . Acquired hammer toes of both feet 01/22/2017  . Diabetic polyneuropathy associated with type 2 diabetes mellitus ([place]) 01/22/2017  . Pre-ulcerative corn or callous 01/22/2017  . Essential hypertension 11/13/2015  . Hyperlipemia, retention 11/13/2015  . Atrial flutter ([place]) 01/24/2014  . OSA (obstructive sleep apnea) 01/24/2014  . Stroke ([place]) 01/24/2014  . Cerebral artery occlusion with cerebral infarction ([place]) 12/04/2013  . Arthritis 11/29/2013  . B-complex deficiency 11/29/2013  . Sleep apnea 11/29/2013  . Thyroid disease 11/29/2013  . Type II diabetes mellitus with ophthalmic manifestations ([place]) 11/29/2013    Orientation RESPIRATION BLADDER Height & Weight     Self, Place  Normal Incontinent Weight: 223 lb 1.7 oz (101.2 kg) Height:  5\' 8"  (172.7 cm)  BEHAVIORAL SYMPTOMS/MOOD NEUROLOGICAL BOWEL NUTRITION STATUS      Incontinent Diet(see DC Summary)  AMBULATORY  STATUS COMMUNICATION OF NEEDS Skin   Extensive Assist Verbally PU Stage and Appropriate Care PU Stage 1 Dressing: BID(mid sacrum, foam dressing)                     Personal Care Assistance Level of Assistance  Bathing, Feeding, Dressing Bathing Assistance: Maximum assistance Feeding assistance: Maximum assistance Dressing Assistance: Maximum assistance     Functional Limitations Info  Hearing   Hearing Info: Impaired      SPECIAL CARE FACTORS FREQUENCY  OT (By licensed OT), PT (By licensed PT), Speech therapy     PT Frequency: 5x/wk OT Frequency: 5x/wk     Speech Therapy Frequency: 5x/wk      Contractures Contractures Info: Not present    Additional Factors Info  Code Status, Allergies, Psychotropic, Insulin Sliding Scale, Isolation Precautions Code Status Info: DNR Allergies Info: Gabapentin, Lisinopril Psychotropic Info: Wellbutrin 100mg  3x/day Insulin Sliding Scale Info: 0-15 units every 4 hours; 5 units every 4 hours; Levemir 20 units 2x/day Isolation Precautions Info: Airborne/Contact: COVID-19     Current Medications (10/14/2019):  This is the current hospital active medication list Current Facility-Administered Medications  Medication Dose Route Frequency Provider Last Rate Last Admin  . 0.9 %  sodium chloride infusion  250 mL Intravenous Continuous Ollis, Brandi L, NP 10 mL/hr at 10/05/19 1208 250 mL at 10/05/19 1208  . apixaban (ELIQUIS) tablet 5 mg  5 mg Per Tube BID Mannam, Praveen, MD   5 mg at 10/14/19 0924  . atropine 1 MG/10ML injection 0.4 mg  0.4 mg Intravenous PRN Laurin Coder, MD  1 mg at 10/05/19 2357  . buPROPion [facility]) tablet 100 mg  100 mg Per Tube TID Noe Gens L, NP   100 mg at 10/14/19 0923  . carvedilol (COREG) tablet 3.125 mg  3.125 mg Oral BID Rigoberto Noel, MD   3.125 mg at 10/14/19 [id number]  . chlorhexidine (PERIDEX) 0.12 % solution 15 mL  15 mL Mouth/Throat BID Collene Gobble, MD   15 mL at 10/14/19 0752  .  Chlorhexidine Gluconate Cloth 2 % PADS 6 each  6 each Topical Daily Audria Nine, DO   6 each at 10/14/19 0926  . famotidine (PEPCID) 40 MG/5ML suspension 20 mg  20 mg Per Tube Daily Audria Nine, DO   20 mg at 10/14/19 [id number]  . feeding supplement (VITAL AF 1.2 CAL) liquid 1,000 mL  1,000 mL Per Tube Continuous Mannam, Praveen, MD 80 mL/hr at 10/12/19 1521 1,000 mL at 10/12/19 1521  . free water 200 mL  200 mL Per Tube Q6H Ollis, Brandi L, NP   200 mL at 10/14/19 1109  . [START ON 10/15/2019] furosemide (LASIX) tablet 20 mg  20 mg Oral Daily Amin, Ankit Chirag, MD      . gabapentin (NEURONTIN) 250 MG/5ML solution 300 mg  300 mg Per Tube Q8H Collene Gobble, MD   300 mg at 10/14/19 1328  . Gerhardt's butt cream   Topical Daily Damita Lack, MD   Given at 10/14/19 [id number]  . hydrALAZINE (APRESOLINE) tablet 50 mg  50 mg Oral TID Rigoberto Noel, MD   50 mg at 10/14/19 0924  . influenza vaccine adjuvanted (FLUAD) injection 0.5 mL  0.5 mL Intramuscular Tomorrow-1000 Byrum, Rose Fillers, MD      . insulin aspart (novoLOG) injection 0-15 Units  0-15 Units Subcutaneous Q4H Ollis, Brandi L, NP   2 Units at 10/14/19 0753  . insulin aspart (novoLOG) injection 5 Units  5 Units Subcutaneous Q4H Mannam, Praveen, MD   5 Units at 10/14/19 1110  . insulin detemir (LEVEMIR) injection 20 Units  20 Units Subcutaneous BID Noe Gens L, NP   20 Units at 10/14/19 0918  . ipratropium (ATROVENT HFA) inhaler 2 puff  2 puff Inhalation Q8H Amin, Ankit Chirag, MD   2 puff at 10/14/19 1327  . levalbuterol (XOPENEX HFA) inhaler 1-2 puff  1-2 puff Inhalation Q8H Amin, Ankit Chirag, MD   2 puff at 10/14/19 1327  . levothyroxine (SYNTHROID) tablet 175 mcg  175 mcg Per Tube Daily Audria Nine, DO   175 mcg at 10/14/19 [id number]  . loperamide (IMODIUM) capsule 4 mg  4 mg Oral PRN Damita Lack, MD   4 mg at 10/14/19 1203  . MEDLINE mouth rinse  15 mL Mouth Rinse 10 times per day Audria Nine, DO   15 mL at 10/14/19  1329  . pneumococcal 23 valent vaccine (PNEUMOVAX-23) injection 0.5 mL  0.5 mL Intramuscular Tomorrow-1000 Byrum, Rose Fillers, MD      . sodium chloride flush (NS) 0.9 % injection 10-40 mL  10-40 mL Intracatheter Q12H Collene Gobble, MD   10 mL at 10/13/19 2100  . sodium chloride flush (NS) 0.9 % injection 10-40 mL  10-40 mL Intracatheter PRN Collene Gobble, MD         Discharge Medications: Please see discharge summary for a list of discharge medications.  Relevant Imaging Results:  Relevant Lab Results:   Additional Information SS#: [id number]  Geralynn Ochs, LCSW

## 2019-10-14 NOTE — Progress Notes (Signed)
PROGRESS NOTE    Debra Mcconnell  [id number] DOB: 06-27-1946 DOA: 10/04/2019 PCP: Greig Right, MD   Brief Narrative:  74 year old with history of DM2, CVA, HTN, HLD, atrial flutter, hypothyroidism, OSA presented from [place] with diagnosis of COVID-19/hypoxia, acute kidney injury and hyperglycemia.  CTA chest was negative for PE, CT head, negative.  She required intubation for hypoxic respiratory failure on 1/26 then eventually extubated on 2/3.  Echocardiogram showed new diagnosis of CHF with a EF of 25-30% with global hypokinesia concerning for myocarditis.  Received steroids, completed remdesivir.  Transferred out of the ICU to [facility] service.  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 status.  Will request speech to work with her daily to see her progress. -Core track in place with tube feeding. -PT/OT-SNF -Remdesivir-completed 1/30 -Decadron -Bronchodilators- Xopenex. IS/ Flutter -Incentive spirometer flutter valve.  Monitor inflammatory markers.  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  Appreciate input.  Continue Coreg, and hydralazine Lasix transition to 20 mg daily.  Isosorbide dinitrate added. -Repeat echo in about 3 weeks and follow-up in 1 month.  Acute kidney  injury -Greatly improved.  Mild transaminitis -Suspect secondary to congestion.  Monitor this.  Diabetes mellitus type 2 with peripheral neuropathy -Insulin sliding scale Accu-Chek.  Hemoglobin A1c 7 -Levemir 20 units twice daily -Continue gabapentin  Essential hypertension -Hydralazine 50 mg 3 times daily.  Coreg 3.125 mg twice daily.  Daily oral Lasix.  Isosorbide mononitrate.  History of atrial flutter History of splenic infarcts -Eliquis.  Obstructive sleep apnea -CPAP or BiPAP nightly  Hypothyroidism -Synthroid 175 mcg daily  History of depression/anxiety -Continue Wellbutrin  Acute urinary retention -Improved  Central line removed 2/4  DVT prophylaxis: Eliquis Code Status: DNR, no reintubation Family Communication:   Disposition Plan: Transfer to progressive care unit  Patient [facility]  Patient Anticipated D/C place= eventually will go back to SNF  Barriers= currently on tube feeds due to high risk for aspiration.  Will need to continue to work with them daily.  Once she is able to tolerate oral, will transition her to a SNF.   Subjective: Tolerated BiPAP overnight.  Breathing is better.  Failed speech therapy yesterday, not tolerating orals at this point.  Review of Systems Otherwise negative except as per HPI, including: General = no fevers, chills, dizziness,  fatigue HEENT/EYES = negative for loss of vision, double vision, blurred vision,  sore throa Cardiovascular= negative for chest pain, palpitation Respiratory/lungs= negative for shortness of breath, cough, wheezing; hemoptysis,  Gastrointestinal= negative for nausea, vomiting, abdominal pain Genitourinary= negative for Dysuria MSK = Negative for arthralgia, myalgias Neurology= Negative for headache, numbness, tingling  Psychiatry= Negative for suicidal and homocidal ideation Skin= Negative for Rash  Examination: Constitutional: Not in acute distress, chronically ill and frail.   Feeding tube in place Respiratory: Bibasilar rhonchi Cardiovascular: Normal sinus rhythm, no rubs Abdomen: Nontender nondistended good  bowel sounds Musculoskeletal: No edema noted.  Strength in all extremities 4/5 Skin: No rashes seen Neurologic: CN 2-12 grossly intact.  And nonfocal Psychiatric: Normal judgment and insight. Alert and oriented x 3. Normal mood.   Objective: Vitals:   10/13/19 2231 10/14/19 0713 10/14/19 0917 10/14/19 1103  BP:  124/73 136/72 100/83  Pulse: 74 71  70  Resp:  18  19  Temp:  (!) 97.5 F (36.4 C)  (!) 96.7 F (35.9 C)  TempSrc:  Axillary  Axillary  SpO2: 94% 95%  94%  Weight:      Height:        Intake/Output Summary (Last 24 hours) at 10/14/2019 1141 Last data filed at 10/14/2019 0300 Gross per 24 hour  Intake 160 ml  Output 1351 ml  Net -1191 ml   Filed Weights   10/11/19 0412 10/12/19 0341 10/13/19 0429  Weight: 101 kg 100.5 kg 101.2 kg     Data Reviewed:   CBC: Recent Labs  Lab 10/10/19 0450 10/10/19 0450 10/11/19 0450 10/12/19 0440 10/12/19 1006 10/13/19 0435 10/14/19 0313  WBC 6.8  --  6.3 6.0  --  7.2 7.6  HGB 12.5   < > 12.2 12.2 10.2* 12.4 12.7  HCT 39.8   < > 39.2 39.8 30.0* 40.4 40.4  MCV 103.1*  --  103.7* 104.2*  --  103.1* 101.3*  PLT 132*  --  135* 165  --  173 182   < > = values in this interval not displayed.   Basic Metabolic Panel: Recent Labs  Lab 10/09/19 0354 10/09/19 0354 10/10/19 0450 10/10/19 0450 10/11/19 0450 10/12/19 0440 10/12/19 1006 10/13/19 0435 10/14/19 0313  NA 147*   < > 147*   < > 146* 147* 140 147* 144  K 4.3   < > 4.6   < > 4.5 4.9 4.4 4.4 4.5  CL 110   < > 111  --  109 109  --  106 106  CO2 28   < > 26  --  27 28  --  31 27  GLUCOSE 159*   < > 102*  --  205* 144*  --  170* 201*  BUN 94*   < > 99*  --  96* 95*  --  94* 87*  CREATININE 1.37*   < > 1.34*  --  1.15* 1.10*  --  1.04* 1.03*  CALCIUM 8.2*   < > 8.3*  --  8.2* 8.4*  --  8.4* 8.5*  MG 2.6*  --   --   --   --   --   --   2.4 2.4  PHOS 4.0  --   --   --   --   --   --   --   --    < > = values in this interval not displayed.   GFR: Estimated Creatinine Clearance: 62.3 mL/min (A) (by C-G formula based on SCr of 1.03 mg/dL (H)). Liver Function Tests: No results for input(s): AST, ALT, ALKPHOS, BILITOT, PROT, ALBUMIN in the last 168 hours. No results for input(s): LIPASE, AMYLASE in the last 168 hours. No results for input(s): AMMONIA in the last 168 hours. Coagulation Profile: No results for input(s): INR, PROTIME in the last 168 hours. Cardiac Enzymes: No results for input(s): CKTOTAL, CKMB, CKMBINDEX, TROPONINI in the last 168 hours. BNP (last 3 results) No results for input(s): PROBNP in the last 8760 hours. HbA1C: No results for input(s): HGBA1C in the last  72 hours. CBG: Recent Labs  Lab 10/13/19 2051 10/14/19 0021 10/14/19 0426 10/14/19 0711 10/14/19 1105  GLUCAP 186* 247* 149* 129* 103*   Lipid Profile: No results for input(s): CHOL, HDL, LDLCALC, TRIG, CHOLHDL, LDLDIRECT in the last 72 hours. Thyroid Function Tests: No results for input(s): TSH, T4TOTAL, FREET4, T3FREE, THYROIDAB in the last 72 hours. Anemia Panel: No results for input(s): VITAMINB12, FOLATE, FERRITIN, TIBC, IRON, RETICCTPCT in the last 72 hours. Sepsis Labs: No results for input(s): PROCALCITON, LATICACIDVEN in the last 168 hours.  Recent Results (from the past 240 hour(s))  Culture, respiratory (tracheal aspirate)     Status: None   Collection Time: 10/04/19  5:28 PM   Specimen: Tracheal Aspirate; Respiratory  Result Value Ref Range Status   Specimen Description   Final    TRACHEAL ASPIRATE Performed at [facility] [address], [place], [place] [address]    Special Requests   Final    NONE Performed at [facility], [place] [address], [place], [place] [address]    Gram Stain   Final    RARE WBC PRESENT,BOTH PMN AND MONONUCLEAR NO ORGANISMS SEEN    Culture   Final     NO GROWTH 2 DAYS Performed at [facility], [place] [address], [place], [place] [address]    Report Status 10/09/2019 FINAL  Final  Culture, blood (routine x 2)     Status: None   Collection Time: 10/04/19  7:50 PM   Specimen: BLOOD LEFT HAND  Result Value Ref Range Status   Specimen Description   Final    BLOOD LEFT HAND Performed at [facility], [place] [address] [place], [place] [address]    Special Requests   Final    BOTTLES DRAWN AEROBIC ONLY Blood Culture adequate volume Performed at [place] [address], [place], [place] [address]    Culture   Final    NO GROWTH 5 DAYS Performed at [facility], [place] [address], [place], [place] [address]    Report Status 10/09/2019 FINAL  Final  Culture, blood (routine x 2)     Status: None   Collection Time: 10/04/19  8:00 PM   Specimen: BLOOD LEFT WRIST  Result Value Ref Range Status   Specimen Description   Final    BLOOD LEFT WRIST Performed at [facility], [place] [address], [place], [place] [address]    Special Requests   Final    BOTTLES DRAWN AEROBIC ONLY Blood Culture adequate volume Performed at [facility] [address], [place], [place] [address]    Culture   Final    NO GROWTH 5 DAYS Performed at [facility], [place] [address] [place], [place] [address]    Report Status 10/09/2019 FINAL  Final  Urine culture     Status: None   Collection Time: 10/05/19  1:30 AM   Specimen: Urine, Random  Result Value Ref Range Status   Specimen Description   Final    URINE, RANDOM Performed at [facility] [address] [place], [place] [address]    Special Requests   Final    NONE Performed at [facility], [place] [address], [place], [place] [address]    Culture   Final    NO GROWTH Performed at [facility], [place] [address] [place],  [address]    Report Status 10/06/2019 FINAL  Final  MRSA PCR  Screening     Status: None   Collection Time: 10/05/19  9:02 AM   Specimen: Nasal Mucosa; Nasopharyngeal  Result Value Ref Range Status   MRSA by PCR NEGATIVE NEGATIVE Final    Comment:        The GeneXpert MRSA Assay (FDA approved for NASAL specimens only), is one component of a comprehensive MRSA colonization surveillance program. It is not intended to diagnose MRSA infection nor to guide or monitor treatment for MRSA infections. Performed at [facility], [place] [address] [place], [place] [address]          Radiology Studies: No results found.      Scheduled Meds: . apixaban  5 mg Per Tube BID  . buPROPion  100 mg Per Tube TID  . carvedilol  3.125 mg Oral BID  . chlorhexidine  15 mL Mouth/Throat BID  . Chlorhexidine Gluconate Cloth  6 each Topical Daily  . famotidine  20 mg Per Tube Daily  . free water  200 mL Per Tube Q6H  . [START ON 10/15/2019] furosemide  20 mg Oral Daily  . gabapentin  300 mg Per Tube Q8H  . Gerhardt's butt cream   Topical Daily  . hydrALAZINE  50 mg Oral TID  . influenza vaccine adjuvanted  0.5 mL Intramuscular Tomorrow-1000  . insulin aspart  0-15 Units Subcutaneous Q4H  . insulin aspart  5 Units Subcutaneous Q4H  . insulin detemir  20 Units Subcutaneous BID  . ipratropium  2 puff Inhalation Q8H  . levalbuterol  1-2 puff Inhalation Q8H  . levothyroxine  175 mcg Per Tube Daily  . mouth rinse  15 mL Mouth Rinse 10 times per day  . pneumococcal 23 valent vaccine  0.5 mL Intramuscular Tomorrow-1000  . sodium chloride flush  10-40 mL Intracatheter Q12H   Continuous Infusions: . sodium chloride 250 mL (10/05/19 1208)  . feeding supplement (VITAL AF 1.2 CAL) 1,000 mL (10/12/19 1521)     LOS: 10 days   Time spent= 25 mins    Francesa Eugenio Arsenio Loader, MD [facility]  If 7PM-7AM, please contact night-coverage  10/14/2019, 11:41 AM

## 2019-10-14 NOTE — Progress Notes (Signed)
Occupational Therapy Evaluation Patient Details Name: Debra Mcconnell MRN: [id number] DOB: 19-Jun-1946 Today's Date: 10/14/2019    History of Present Illness 74 y.o. female admitted on 10/04/19 for acute respiratory distress due to COVID 19 viral PNA requiring inrtubation in the ED.  Pt extubated 10/12/19.  Pt also dx with myocarditis, AKI, combined septic and cardiogenic shock, A -flutter, splenic infarct.  Pt with significant PMH of DM2, stroke, essential HTN, diabetic neuropathy, A-flutter, arthritis, hammer toes bil, and knee surgery.   Clinical Impression   PTA, pt lived at home and was assisted by her lifelong friend [name] as needed. Pt currently requires Max A + 2 with bed mobility and was able to sit EOB x 10 minutes with min A. BP supine 115/67; sitting 85/64. Pt will benefit from rehab at SNF. Will follow acutely.     Follow Up Recommendations  SNF;Supervision/Assistance - 24 hour    Equipment Recommendations  Other (comment)(TBA)    Recommendations for Other Services       Precautions / Restrictions Precautions Precautions: Other (comment);Fall(Airborne) Precaution Comments: swallow precautions, sacral wound Restrictions Weight Bearing Restrictions: No      Mobility Bed Mobility Overal bed mobility: Needs Assistance Bed Mobility: Rolling;Supine to Sit;Sit to Supine Rolling: +2 for physical assistance;Max assist   Supine to sit: Max assist;+2 for physical assistance;HOB elevated Sit to supine: Max assist;+2 for physical assistance;HOB elevated      Transfers Overall transfer level: Needs assistance                    Balance Overall balance assessment: Needs assistance Sitting-balance support: Feet supported;No upper extremity supported;Bilateral upper extremity supported Sitting balance-Leahy Scale: Poor Sitting balance - Comments: min A at times; increased assistance after sitting, most likely related to decrease in BP                                   ADL either performed or assessed with clinical judgement   ADL Overall ADL's : Needs assistance/impaired Eating/Feeding: NPO Eating/Feeding Details (indicate cue type and reason): NPO - progressing diet with speech; will require self feeding assistance when appropriate Grooming: Wash/dry face;Brushing hair;Moderate assistance   Upper Body Bathing: Moderate assistance   Lower Body Bathing: Total assistance   Upper Body Dressing : Moderate assistance   Lower Body Dressing: Total assistance   Toilet Transfer: Total assistance   Toileting- Clothing Manipulation and Hygiene: Total assistance       Functional mobility during ADLs: Total assistance;+2 for physical assistance       Vision         Perception     Praxis      Pertinent Vitals/Pain Pain Assessment: 0-10 Faces Pain Scale: Hurts a little bit Pain Location: generalized Pain Descriptors / Indicators: Burning;Grimacing Pain Intervention(s): Limited activity within patient's tolerance;Monitored during session     Hand Dominance Right   Extremity/Trunk Assessment Upper Extremity Assessment Upper Extremity Assessment: Generalized weakness;LUE deficits/detail;RUE deficits/detail RUE Deficits / Details: Overall decreased weakness, grossly 3-/5 LUE Deficits / Details: Decreased strength, grossly 3-/5; limited shoulder ROM to about100* actively   Lower Extremity Assessment Lower Extremity Assessment: Defer to PT evaluation       Communication Communication Communication: HOH   Cognition Arousal/Alertness: Lethargic Behavior During Therapy: Flat affect Overall Cognitive Status: Impaired/Different from baseline Area of Impairment: Orientation;Attention;Memory;Following commands;Safety/judgement;Awareness;Problem solving  Orientation Level: Place;Situation Current Attention Level: Sustained Memory: Decreased short-term memory Following Commands: Follows one step commands  inconsistently;Follows one step commands with increased time Safety/Judgement: Decreased awareness of safety;Decreased awareness of deficits Awareness: Emergent Problem Solving: Slow processing;Decreased initiation;Difficulty sequencing;Requires verbal cues;Requires tactile cues     General Comments  Sacral wound    Exercises     Shoulder Instructions      Home Living Family/patient expects to be discharged to:: Private residence Living Arrangements: Alone Available Help at Discharge: Neighbor([name], neighbor, is HCPOA) Type of Home: House Home Access: Stairs to enter [employer] of Steps: 6 Entrance Stairs-Rails: Right;Left Home Layout: One level     Bathroom Shower/Tub: Occupational psychologist: Standard Bathroom Accessibility: Yes   Home Equipment: Shower seat - built in;Cane - single point;Walker - 2 wheels;Bedside commode;Grab bars - tub/shower   Additional Comments: patsy lives 3 mi away      Prior Functioning/Environment Level of Independence: Needs assistance  Gait / Transfers Assistance Needed: uses [facility] to walk house and community.  Holds [name]'s hand until she gets into the store and pushes the cart. Does well with the cart, h/o falls at home.   ADL's / Homemaking Assistance Needed: cooks, [name] helps her clean, she can do light cleaning if necessary.   Communication / Swallowing Assistance Needed: HOH Comments: Pt wears glasses all of the time, likes to watch TV [employer] programs, movies)        OT Problem List: Decreased strength;Decreased range of motion;Decreased activity tolerance;Decreased coordination;Decreased safety awareness;Decreased knowledge of use of DME or AE;Cardiopulmonary status limiting activity      OT Treatment/Interventions: Self-care/ADL training;Therapeutic exercise;Energy conservation;DME and/or AE instruction;Therapeutic activities;Patient/family education    OT Goals(Current goals can be found in the care plan  section) Acute Rehab OT Goals Patient Stated Goal: to get better OT Goal Formulation: Patient unable to participate in goal setting Time For Goal Achievement: 10/28/19 Potential to Achieve Goals: Good  OT Frequency: Min 2X/week   Barriers to D/C: Decreased caregiver support          Co-evaluation              AM-PAC OT "6 Clicks" Daily Activity     Outcome Measure Help from another person eating meals?: Total Help from another person taking care of personal grooming?: A Lot Help from another person toileting, which includes using toliet, bedpan, or urinal?: Total Help from another person bathing (including washing, rinsing, drying)?: A Lot Help from another person to put on and taking off regular upper body clothing?: Total Help from another person to put on and taking off regular lower body clothing?: Total 6 Click Score: 8   End of Session Nurse Communication: Mobility status  Activity Tolerance: Patient limited by fatigue;Patient limited by lethargy Patient left: in bed;with call bell/phone within reach;Other (comment);with bed alarm set(with RN in room)  OT Visit Diagnosis: Muscle weakness (generalized) (M62.81)                Time: [id number] OT Time Calculation (min): 43 min Charges:  OT General Charges $OT Visit: 1 Visit OT Evaluation $OT Eval Moderate Complexity: 1 Mod OT Treatments $Self Care/Home Management : 23-37 mins  Maurie Boettcher, OT/L   Acute OT Clinical Specialist [place] Pager [phone] Office [phone]   [facility] 10/14/2019, 4:43 PM

## 2019-10-14 NOTE — Progress Notes (Signed)
  Speech Language Pathology Treatment: Dysphagia  Patient Details Name: Debra Mcconnell MRN: [id number] DOB: 1945-09-28 Today's Date: 10/14/2019 Time: [id number] SLP Time Calculation (min) (ACUTE ONLY): 22 min  Assessment / Plan / Recommendation Clinical Impression  Pt is more alert today, very attentive to PO and able to alternate between feeding mthods (spoon, cup straw) without cueing. Pt is still dysphonic and upon arrival needed to clear standing secretions audible after waking up. Pt continues to demonstrate signs of aspiration with large volume intake (continuous cup/stra sips with both nectar and honey). Though honey teaspoon was tolerated without coughing, there is a potential for silent aspiration with smaller quantity trials. Pt did very well with puree texture and even after 4 oz of intake had no wet vocal quality or coughing. These subjective observations suggest that there is ongoing dysphagia and though access to PO will be therapeutic, risk is present. Recommend pt continue with Cortrak feeding, though RN should offer bites of pudding and puree often throughout shift. Will f/u for further diet advancement.   HPI HPI: 74 year old with history of DM2, CVA, HTN, HLD, atrial flutter, hypothyroidism, OSA presented from [place] with diagnosis of COVID-19/hypoxia, acute kidney injury and hyperglycemia.  CTA chest was negative for PE, CT head, negative.  She required intubation for hypoxic respiratory failure on 1/26 then eventually extubated on 2/3.  Echocardiogram showed new diagnosis of CHF with a EF of 25-30% with global hypokinesia concerning for myocarditis.  Received steroids, completed remdesivir.  Transferred out of the ICU       SLP Plan  Continue with current plan of care       Recommendations  Diet recommendations: Dysphagia 1 (puree) Medication Administration: Via alternative means                Follow up Recommendations: Skilled Nursing facility SLP  Visit Diagnosis: Dysphagia, unspecified (R13.10) Plan: Continue with current plan of care       GO                Kem Hensen, Katherene Ponto 10/14/2019, 2:32 PM

## 2019-10-14 NOTE — TOC Initial Note (Addendum)
Transition of Care [facility]) - Initial/Assessment Note    Patient Details  Name: Debra Mcconnell MRN: [id number] Date of Birth: [birth year]  Transition of Care [facility]) CM/SW Contact:    Geralynn Ochs, LCSW Phone Number: 10/14/2019, 3:16 PM  Clinical Narrative:     CSW spoke with patient's HCPOA, [name], over phone to discuss recommendation for SNF. CSW provided update to [name] that patient is now able to eat some, hopeful that she will continue to improve and be ready for SNF in the next few days. CSW discussed SNF options with patient's COVID+ status and [name] is to research options available. [name] appreciative of update from [place]. CSW completed referral and faxed out, will follow. Patient will need to be eating prior to discharge to SNF.      Expected Discharge Plan: Skilled Nursing Facility Barriers to Discharge: Continued Medical Work up   Patient Goals and CMS Choice Patient states their goals for this hospitalization and ongoing recovery are:: patient unable to participate in goal setting due to confusion at this time CMS Medicare.gov Compare Post Acute Care list provided to:: Patient Represenative (must comment) Choice offered to / list presented to : [place] / Guardian  Expected Discharge Plan and Services Expected Discharge Plan: [place] Acute Care Choice: [place] Living arrangements for the past 2 months: Single Family Home                                      Prior Living Arrangements/Services Living arrangements for the past 2 months: Single Family Home Lives with:: Self Patient language and need for interpreter reviewed:: No Do you feel safe going back to the place where you live?: Yes      Need for Family Participation in Patient Care: Yes (Comment) Care giver support system in place?: No (comment)   Criminal Activity/Legal Involvement Pertinent to Current Situation/Hospitalization: No - Comment as  needed  Activities of Daily Living Home Assistive Devices/Equipment: None ADL Screening (condition at time of admission) Patient's cognitive ability adequate to safely complete daily activities?: No Is the patient deaf or have difficulty hearing?: Yes Does the patient have difficulty seeing, even when wearing glasses/contacts?: No Does the patient have difficulty concentrating, remembering, or making decisions?: No Patient able to express need for assistance with ADLs?: No Does the patient have difficulty dressing or bathing?: Yes Independently performs ADLs?: No Communication: Dependent Is this a change from baseline?: Change from baseline, expected to last >3 days Dressing (OT): Dependent Is this a change from baseline?: Change from baseline, expected to last >3 days Grooming: Dependent Is this a change from baseline?: Change from baseline, expected to last >3 days Feeding: Dependent Is this a change from baseline?: Change from baseline, expected to last >3 days Bathing: Dependent Is this a change from baseline?: Change from baseline, expected to last >3 days Toileting: Dependent Is this a change from baseline?: Change from baseline, expected to last >3days In/Out Bed: Dependent Is this a change from baseline?: Change from baseline, expected to last >3 days Walks in Home: Dependent Is this a change from baseline?: Change from baseline, expected to last >3 days Does the patient have difficulty walking or climbing stairs?: Yes Weakness of Legs: Both Weakness of Arms/Hands: Both  Permission Sought/Granted Permission sought to share information with : Facility Sport and exercise psychologist, Family Supports Permission granted to share information with :  Yes, Verbal Permission Granted  Share Information with NAME: [name]  Permission granted to share info w AGENCY: SNF  Permission granted to share info w Relationship: HCPOA     Emotional Assessment   Attitude/Demeanor/Rapport: Unable to  Assess Affect (typically observed): Unable to Assess Orientation: : Oriented to Self, Oriented to Place Alcohol / Substance Use: Not Applicable Psych Involvement: No (comment)  Admission diagnosis:  Acute respiratory distress syndrome (ARDS) due to COVID-19 virus ([place]) [U07.1, J80] Patient Active Problem List   Diagnosis Date Noted  . Acute respiratory failure with hypoxia ([place])   . Biventricular failure ([place])   . Pressure injury of skin 10/05/2019  . 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 Comprehensive diabetic foot examination, type 2 DM, encounter for ([place]) 11/11/2018  . Chronic anticoagulation 04/16/2017  . Acquired hammer toes of both feet 01/22/2017  . Diabetic polyneuropathy associated with type 2 diabetes mellitus ([place]) 01/22/2017  . Pre-ulcerative corn or callous 01/22/2017  . Essential hypertension 11/13/2015  . Hyperlipemia, retention 11/13/2015  . Atrial flutter ([place]) 01/24/2014  . OSA (obstructive sleep apnea) 01/24/2014  . Stroke ([place]) 01/24/2014  . Cerebral artery occlusion with cerebral infarction ([place]) 12/04/2013  . Arthritis 11/29/2013  . B-complex deficiency 11/29/2013  . Sleep apnea 11/29/2013  . Thyroid disease 11/29/2013  . Type II diabetes mellitus with ophthalmic manifestations ([place]) 11/29/2013   PCP:  Greig Right, MD Pharmacy:   [facility] [place], [place] AT [place] [id number] [place] [address] Phone: [phone] Fax: [phone]  [facility], [place] AT Portal to Registered [employer] [place] [address] Phone: [phone] Fax: [phone]     Social Determinants of Health (SDOH) Interventions    Readmission Risk Interventions No flowsheet data found.

## 2019-10-14 NOTE — Progress Notes (Signed)
Pt not placed on CPAP due to confusion and has a core track in place. Rn aware.

## 2019-10-14 NOTE — Progress Notes (Signed)
Asked by hospitalist attending to review recommendations for decreased LV systolic function/possible COVID myocardial dysfunction. Extubated, no longer on pressors, renal function has improved, but BUN still very high. Still has Cortrak in place. Will require inpatient rehab. Recommend stopping IV furosemide and start furosemide PO 20 mg daily. Continue hydralazine and add isosorbide dinitrate 10 mg TID. Hold off restarting ARB for now, can resume as BUN drops and BP rises. Will arrange repeat echo in 3 weeks and office visit to follow that.  Sanda Klein, MD, [facility] [phone] office [phone] pager

## 2019-10-15 LAB — BASIC METABOLIC PANEL
Anion gap: 9 (ref 5–15)
Anion gap: 15 (ref 5–15)
BUN: 78 mg/dL — ABNORMAL HIGH (ref 8–23)
BUN: 78 mg/dL — ABNORMAL HIGH (ref 8–23)
CO2: 29 mmol/L (ref 22–32)
CO2: 27 mmol/L (ref 22–32)
Calcium: 8.3 mg/dL — ABNORMAL LOW (ref 8.9–10.3)
Calcium: 8 mg/dL — ABNORMAL LOW (ref 8.9–10.3)
Chloride: 103 mmol/L (ref 98–111)
Chloride: 101 mmol/L (ref 98–111)
Creatinine, Ser: 0.96 mg/dL (ref 0.44–1.00)
Creatinine, Ser: 0.98 mg/dL (ref 0.44–1.00)
GFR calc Af Amer: >60 mL/min (ref >60)
GFR calc Af Amer: >60 mL/min (ref >60)
GFR calc non Af Amer: 60 mL/min — ABNORMAL LOW (ref >60)
GFR calc non Af Amer: 58 mL/min — ABNORMAL LOW (ref >60)
Glucose, Bld: 81 mg/dL (ref 70–99)
Glucose, Bld: 88 mg/dL (ref 70–99)
Potassium: 4.4 mmol/L (ref 3.5–5.1)
Potassium: 4 mmol/L (ref 3.5–5.1)
Sodium: 141 mmol/L (ref 135–145)
Sodium: 143 mmol/L (ref 135–145)

## 2019-10-15 LAB — GLUCOSE, CAPILLARY
Glucose-Capillary: 121 mg/dL — ABNORMAL HIGH (ref 70–99)
Glucose-Capillary: 145 mg/dL — ABNORMAL HIGH (ref 70–99)
Glucose-Capillary: 77 mg/dL (ref 70–99)
Glucose-Capillary: 82 mg/dL (ref 70–99)
Glucose-Capillary: 82 mg/dL (ref 70–99)
Glucose-Capillary: 98 mg/dL (ref 70–99)
Glucose-Capillary: 98 mg/dL (ref 70–99)

## 2019-10-15 LAB — CBC
HCT: 41.4 % (ref 36.0–46.0)
Hemoglobin: 13.2 g/dL (ref 12.0–15.0)
MCH: 32.3 pg (ref 26.0–34.0)
MCHC: 31.9 g/dL (ref 30.0–36.0)
MCV: 101.2 fL — ABNORMAL HIGH (ref 80.0–100.0)
Platelets: 173 10*3/uL (ref 150–400)
RBC: 4.09 MIL/uL (ref 3.87–5.11)
RDW: 16.6 % — ABNORMAL HIGH (ref 11.5–15.5)
WBC: 9.6 10*3/uL (ref 4.0–10.5)
nRBC: 0 % (ref 0.0–0.2)

## 2019-10-15 LAB — MAGNESIUM
Magnesium: 2.1 mg/dL (ref 1.7–2.4)
Magnesium: 2.2 mg/dL (ref 1.7–2.4)

## 2019-10-15 MED ORDER — INSULIN DETEMIR 100 UNIT/ML ~~LOC~~ SOLN
10.0000 [IU] | Freq: Every day | SUBCUTANEOUS | Status: DC
  Administered 2019-10-16 – 2019-10-20 (×5): 10 [IU] via SUBCUTANEOUS
  Filled 2019-10-15 (×5): qty 0.1

## 2019-10-15 MED ORDER — INSULIN DETEMIR 100 UNIT/ML ~~LOC~~ SOLN: 10.0000 [IU] | Freq: Two times a day (BID) | SUBCUTANEOUS | Status: DC

## 2019-10-15 NOTE — Progress Notes (Signed)
PROGRESS NOTE    Debra Mcconnell  [id number] DOB: 06-02-1946 DOA: 10/04/2019 PCP: Greig Right, MD   Brief Narrative:  74 year old with history of DM2, CVA, HTN, HLD, atrial flutter, hypothyroidism, OSA presented from [place] with diagnosis of COVID-19/hypoxia, acute kidney injury and hyperglycemia.  CTA chest was negative for PE, CT head, negative.  She required intubation for hypoxic respiratory failure on 1/26 then eventually extubated on 2/3.  Echocardiogram showed new diagnosis of CHF with a EF of 25-30% with global hypokinesia concerning for myocarditis.  Received steroids, completed remdesivir.  Transferred out of the ICU to [facility] service.  Failed speech and swallow therefore core track in place.  I am hopeful this acute dysphagia will improve and will slowly be able to advance her diet.   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 -Extubated 2/3.  Moderate aspiration risk, progressing well with speech and swallow therapy but still not adequate p.o. intake.  Tube feed to be continued for now with small supplementation orally by the nursing staff as recommended by speech. -Core track in place with tube feeding. -PT/OT-SNF -Remdesivir-completed 1/30 -Decadron -Bronchodilators- Xopenex. IS/ Flutter -Incentive spirometer flutter valve.  Monitor inflammatory markers.  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  Continue Coreg, and hydralazine Lasix transition to 20 mg daily.  Isosorbide dinitrate added. -Plan to repeat echo in 3 weeks and follow-up in 1 month  Acute kidney injury -Greatly improved.  Mild transaminitis -Suspect secondary to congestion.  Monitor this.  Diabetes mellitus type 2 with peripheral neuropathy, well controlled. -Insulin sliding scale Accu-Chek.  Hemoglobin A1c 7 -Levemir 20 units twice daily.  If starts dropping in 60s will reduce the dose. -Continue gabapentin  Essential hypertension -Hydralazine 50 mg 3 times daily.  Coreg 3.125 mg twice daily.  Daily oral Lasix.  Isosorbide mononitrate.  History of atrial flutter History of splenic infarcts -Eliquis.  Obstructive sleep apnea -CPAP or BiPAP nightly  Hypothyroidism -Synthroid 175 mcg daily  History of depression/anxiety -Continue Wellbutrin  Acute urinary retention -Improved  Central line removed 2/4  DVT prophylaxis: Eliquis Code Status: DNR, no intubation Family Communication:   Disposition Plan: Transfer to progressive care unit  Patient [facility]  Patient Anticipated D/C place= eventually will go back to SNF  Barriers= currently getting tube feeds secondary to high aspiration risk.  Very hopeful that she will recover from this over next few days.  Tube feeding has been discontinued, will transition her to skilled nursing facility.   Subjective: Appears to be more awake this morning and interactive without any complaints.  Review of Systems Otherwise negative except as per HPI, including: General = no fevers, chills, dizziness,  fatigue HEENT/EYES = negative for loss of vision, double vision, blurred vision,  sore throa Cardiovascular= negative for chest pain, palpitation Respiratory/lungs= negative for shortness of breath, cough, wheezing; hemoptysis,  Gastrointestinal= negative for nausea, vomiting, abdominal pain Genitourinary=  negative for Dysuria MSK = Negative for arthralgia, myalgias Neurology= Negative for headache,  numbness, tingling  Psychiatry= Negative for suicidal and homocidal ideation Skin= Negative for Rash   Examination: Constitutional: Not in acute distress, feeding tube in place Respiratory: Minimal bibasilar rhonchi Cardiovascular: Normal sinus rhythm, no rubs Abdomen: Nontender nondistended good bowel sounds Musculoskeletal: No edema noted Skin: No rashes seen Neurologic: CN 2-12 grossly intact.  And nonfocal Psychiatric: Normal judgment and insight. Alert and oriented x 3. Normal mood.   Objective: Vitals:   10/15/19 0400 10/15/19 0439 10/15/19 0719 10/15/19 0955  BP: 110/62  115/88 106/89  Pulse: 69  70   Resp: 20  16   Temp: 97.8 F (36.6 C)  (!) 97.1 F (36.2 C)   TempSrc: Oral  Axillary   SpO2: 92%  95%   Weight:  100.6 kg    Height:        Intake/Output Summary (Last 24 hours) at 10/15/2019 1011 Last data filed at 10/15/2019 0800 Gross per 24 hour  Intake --  Output 6275 ml  Net -6275 ml   Filed Weights   10/12/19 0341 10/13/19 0429 10/15/19 0439  Weight: 100.5 kg 101.2 kg 100.6 kg     Data Reviewed:   CBC: Recent Labs  Lab 10/11/19 0450 10/11/19 0450 10/12/19 0440 10/12/19 1006 10/13/19 0435 10/14/19 0313 10/15/19 0253  WBC 6.3  --  6.0  --  7.2 7.6 9.6  HGB 12.2   < > 12.2 10.2* 12.4 12.7 13.2  HCT 39.2   < > 39.8 30.0* 40.4 40.4 41.4  MCV 103.7*  --  104.2*  --  103.1* 101.3* 101.2*  PLT 135*  --  165  --  173 182 173   < > = values in this interval not displayed.   Basic Metabolic Panel: Recent Labs  Lab 10/09/19 0354 10/10/19 0450 10/12/19 0440 10/12/19 0440 10/12/19 1006 10/13/19 0435 10/14/19 0313 10/15/19 0020 10/15/19 0253  NA 147*   < > 147*   < > 140 147* 144 141 143  K 4.3   < > 4.9   < > 4.4 4.4 4.5 4.4 4.0  CL 110   < > 109  --   --  106 106 103 101  CO2 28   < > 28  --   --  31 27 29 27   GLUCOSE 159*   < > 144*  --   --  170*  201* 81 88  BUN 94*   < > 95*  --   --  94* 87* 78* 78*  CREATININE 1.37*   < > 1.10*  --   --  1.04* 1.03* 0.96 0.98  CALCIUM 8.2*   < > 8.4*  --   --  8.4* 8.5* 8.3* 8.0*  MG 2.6*  --   --   --   --  2.4 2.4 2.1 2.2  PHOS 4.0  --   --   --   --   --   --   --   --    < > = values in this interval not displayed.   GFR: Estimated Creatinine Clearance: 65.3 mL/min (by C-G formula based on SCr of 0.98 mg/dL). Liver Function Tests: No results for input(s): AST, ALT, ALKPHOS, BILITOT, PROT, ALBUMIN in the last 168 hours. No results for input(s): LIPASE, AMYLASE in the last 168 hours. No results for input(s): AMMONIA in the last 168 hours. Coagulation Profile: No results for input(s): INR, PROTIME in the last 168 hours. Cardiac Enzymes: No results for input(s): CKTOTAL, CKMB, CKMBINDEX, TROPONINI in  the last 168 hours. BNP (last 3 results) No results for input(s): PROBNP in the last 8760 hours. HbA1C: No results for input(s): HGBA1C in the last 72 hours. CBG: Recent Labs  Lab 10/14/19 1535 10/14/19 2000 10/14/19 2343 10/15/19 0359 10/15/19 0724  GLUCAP 139* 92 77 82 98   Lipid Profile: No results for input(s): CHOL, HDL, LDLCALC, TRIG, CHOLHDL, LDLDIRECT in the last 72 hours. Thyroid Function Tests: No results for input(s): TSH, T4TOTAL, FREET4, T3FREE, THYROIDAB in the last 72 hours. Anemia Panel: No results for input(s): VITAMINB12, FOLATE, FERRITIN, TIBC, IRON, RETICCTPCT in the last 72 hours. Sepsis Labs: No results for input(s): PROCALCITON, LATICACIDVEN in the last 168 hours.  No results found for this or any previous visit (from the past 240 hour(s)).       Radiology Studies: No results found.      Scheduled Meds: . apixaban  5 mg Per Tube BID  . buPROPion  100 mg Per Tube TID  . carvedilol  3.125 mg Oral BID  . chlorhexidine  15 mL Mouth/Throat BID  . Chlorhexidine Gluconate Cloth  6 each Topical Daily  . famotidine  20 mg Per Tube Daily  . free water   200 mL Per Tube Q6H  . furosemide  20 mg Oral Daily  . gabapentin  300 mg Per Tube Q8H  . Gerhardt's butt cream   Topical Daily  . hydrALAZINE  50 mg Oral TID  . influenza vaccine adjuvanted  0.5 mL Intramuscular Tomorrow-1000  . insulin aspart  0-15 Units Subcutaneous Q4H  . insulin aspart  5 Units Subcutaneous Q4H  . insulin detemir  20 Units Subcutaneous BID  . ipratropium  2 puff Inhalation Q8H  . levalbuterol  1-2 puff Inhalation Q8H  . levothyroxine  175 mcg Per Tube Daily  . mouth rinse  15 mL Mouth Rinse 10 times per day  . pneumococcal 23 valent vaccine  0.5 mL Intramuscular Tomorrow-1000  . sodium chloride flush  10-40 mL Intracatheter Q12H   Continuous Infusions: . sodium chloride 250 mL (10/05/19 1208)  . feeding supplement (VITAL AF 1.2 CAL) 80 mL/hr at 10/15/19 0700     LOS: 11 days   Time spent= 25 mins    Banyan Goodchild Arsenio Loader, MD [facility]  If 7PM-7AM, please contact night-coverage  10/15/2019, 10:11 AM

## 2019-10-15 NOTE — Progress Notes (Signed)
Pt is resting comfortably. She has in a core track in place. CPAP not placed on at this time due to prior nights confusion and core track in place.  RA Sp02 95% HR 70 RR 22

## 2019-10-16 LAB — GLUCOSE, CAPILLARY
Glucose-Capillary: 93 mg/dL (ref 70–99)
Glucose-Capillary: 178 mg/dL — ABNORMAL HIGH (ref 70–99)
Glucose-Capillary: 142 mg/dL — ABNORMAL HIGH (ref 70–99)
Glucose-Capillary: 184 mg/dL — ABNORMAL HIGH (ref 70–99)
Glucose-Capillary: 206 mg/dL — ABNORMAL HIGH (ref 70–99)

## 2019-10-16 LAB — BASIC METABOLIC PANEL
Anion gap: 10 (ref 5–15)
BUN: 72 mg/dL — ABNORMAL HIGH (ref 8–23)
CO2: 28 mmol/L (ref 22–32)
Calcium: 8.1 mg/dL — ABNORMAL LOW (ref 8.9–10.3)
Chloride: 102 mmol/L (ref 98–111)
Creatinine, Ser: 0.9 mg/dL (ref 0.44–1.00)
GFR calc Af Amer: >60 mL/min (ref >60)
GFR calc non Af Amer: >60 mL/min (ref >60)
Glucose, Bld: 91 mg/dL (ref 70–99)
Potassium: 4.6 mmol/L (ref 3.5–5.1)
Sodium: 140 mmol/L (ref 135–145)

## 2019-10-16 LAB — CBC
HCT: 39.4 % (ref 36.0–46.0)
Hemoglobin: 12.4 g/dL (ref 12.0–15.0)
MCH: 32 pg (ref 26.0–34.0)
MCHC: 31.5 g/dL (ref 30.0–36.0)
MCV: 101.5 fL — ABNORMAL HIGH (ref 80.0–100.0)
Platelets: 160 10*3/uL (ref 150–400)
RBC: 3.88 MIL/uL (ref 3.87–5.11)
RDW: 16.4 % — ABNORMAL HIGH (ref 11.5–15.5)
WBC: 9.6 10*3/uL (ref 4.0–10.5)
nRBC: 0 % (ref 0.0–0.2)

## 2019-10-16 LAB — VITAMIN B12: Vitamin B-12: 627 pg/mL (ref 180–914)

## 2019-10-16 LAB — MAGNESIUM: Magnesium: 2.3 mg/dL (ref 1.7–2.4)

## 2019-10-16 LAB — TSH: TSH: 6.312 u[IU]/mL — ABNORMAL HIGH (ref 0.350–4.500)

## 2019-10-16 NOTE — Plan of Care (Signed)
Pt A&Ox1, oriented to self only. VSS, SpO2 >88% on RA. No c/o pain throughout shift. BG stable w/ sliding scale coverage. T&R Q2. Utilizing purewick w. Adequate output.   Neighbor [name] called for an update. Inquired about facility pt would eventually be transported to. Writer informed her that CM/SW would be arranging that & would be back on Monday. All other questions were answered at this time;  All safety measures in place. Will give report to oncoming shift  Tomie China    Problem: Education: Goal: Knowledge of risk factors and measures for prevention of condition will improve Outcome: Progressing   Problem: Coping: Goal: Psychosocial and spiritual needs will be supported Outcome: Progressing   Problem: Respiratory: Goal: Will maintain a patent airway Outcome: Progressing Goal: Complications related to the disease process, condition or treatment will be avoided or minimized Outcome: Progressing   Problem: Education: Goal: Knowledge of General Education information will improve Description: Including pain rating scale, medication(s)/side effects and non-pharmacologic comfort measures Outcome: Progressing   Problem: Health Behavior/Discharge Planning: Goal: Ability to manage health-related needs will improve Outcome: Progressing   Problem: Clinical Measurements: Goal: Ability to maintain clinical measurements within normal limits will improve Outcome: Progressing Goal: Will remain free from infection Outcome: Progressing Goal: Diagnostic test results will improve Outcome: Progressing Goal: Respiratory complications will improve Outcome: Progressing Goal: Cardiovascular complication will be avoided Outcome: Progressing   Problem: Activity: Goal: Risk for activity intolerance will decrease Outcome: Progressing   Problem: Nutrition: Goal: Adequate nutrition will be maintained Outcome: Progressing   Problem: Coping: Goal: Level of anxiety will  decrease Outcome: Progressing   Problem: Elimination: Goal: Will not experience complications related to bowel motility Outcome: Progressing Goal: Will not experience complications related to urinary retention Outcome: Progressing   Problem: Pain Managment: Goal: General experience of comfort will improve Outcome: Progressing   Problem: Safety: Goal: Ability to remain free from injury will improve Outcome: Progressing   Problem: Skin Integrity: Goal: Risk for impaired skin integrity will decrease Outcome: Progressing

## 2019-10-16 NOTE — Progress Notes (Signed)
PROGRESS NOTE    Debra Mcconnell  [id number] DOB: 1946/03/22 DOA: 10/04/2019 PCP: Greig Right, MD   Brief Narrative:  74 year old with history of DM2, CVA, HTN, HLD, atrial flutter, hypothyroidism, OSA presented from [place] with diagnosis of COVID-19/hypoxia, acute kidney injury and hyperglycemia.  CTA chest was negative for PE, CT head, negative.  She required intubation for hypoxic respiratory failure on 1/26 then eventually extubated on 2/3.  Echocardiogram showed new diagnosis of CHF with a EF of 25-30% with global hypokinesia concerning for myocarditis.  Received steroids, completed remdesivir.  Transferred out of the ICU to [facility] service.  Failed speech and swallow therefore core track in place.  I am hopeful this acute dysphagia will improve and will slowly be able to advance her diet.  Lantus dose reduced due to low normal blood sugars.   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 -Extubated 2/3.  Moderate aspiration risk.  Slowly progressing with speech therapy, very hopeful about her oral intake recovery.  Speech team following. -Core track in place with tube feeding. -PT/OT-SNF -Remdesivir-completed 1/30 -Decadron -Bronchodilators- Xopenex. IS/ Flutter -Incentive spirometer flutter valve.  Monitor inflammatory markers.  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  Continue Coreg, and hydralazine Lasix transition to 20 mg daily.  Isosorbide dinitrate added. -Repeat echo in 3 weeks and follow-up in 1 month  Acute kidney injury -Resolved  Macrocytic anemia -Check TSH, B12 and folate  Mild transaminitis -Suspect secondary to congestion.  Stable  Diabetes mellitus type 2 with peripheral neuropathy, well controlled. -Insulin sliding scale Accu-Chek.  Hemoglobin A1c 7 -Levemir reduced to 10 units daily -Continue gabapentin  Essential hypertension -Hydralazine 50 mg 3 times daily.  Coreg 3.125 mg twice daily.  Daily oral Lasix.  Isosorbide mononitrate.  History of atrial flutter History of splenic infarcts -Eliquis.  Obstructive sleep apnea -CPAP or BiPAP nightly  Hypothyroidism -Synthroid 175 mcg daily  History of depression/anxiety -Continue Wellbutrin  Acute urinary retention -Improved  Intermittent confusion and delirium -Stable at this time.  Continue to monitor her.  No focal neuro deficits.  Central line removed 2/4  DVT prophylaxis: Eliquis Code Status: DNR, no intubation Family Communication:   Disposition Plan: Maintain hospital stay until placement  Patient [facility]  Patient Anticipated D/C place= eventually will go back to SNF  Barriers= currently ongoing tube feeds.  Poor oral intake, hopeful in her recovery over the next few days then transition her to skilled nursing facility for further rehabilitation   Subjective: Slightly confused and disoriented this morning but denied any complaints.  She kept telling me the room was hot.  Review of Systems Otherwise negative except as per HPI, including: General = no fevers, chills, dizziness,  fatigue HEENT/EYES = negative for loss of vision, double vision, blurred vision,  sore throa Cardiovascular= negative for chest pain, palpitation Respiratory/lungs= negative for shortness of breath, cough, wheezing; hemoptysis,  Gastrointestinal= negative  for nausea, vomiting, abdominal pain Genitourinary= negative for Dysuria MSK =  Negative for arthralgia, myalgias Neurology= Negative for headache, numbness, tingling  Psychiatry= Negative for suicidal and homocidal ideation Skin= Negative for Rash   Examination: Constitutional: Elderly, chronically frail.  Feeding tube in place Respiratory: Clear to auscultation bilaterally Cardiovascular: Normal sinus rhythm, no rubs Abdomen: Nontender nondistended good bowel sounds Musculoskeletal: No edema noted Skin: No rashes seen Neurologic: CN 2-12 grossly intact.  And nonfocal Psychiatric: Poor judgment and insight.  Alert and oriented to name only.  Baseline AAO X3   Objective: Vitals:   10/15/19 1917 10/15/19 2311 10/16/19 0415 10/16/19 0719  BP: 115/67 126/76 138/78 125/73  Pulse: 67 69 70 76  Resp: (!) 24 20 (!) 23 20  Temp: 98.5 F (36.9 C) 99.4 F (37.4 C) 98.6 F (37 C) 97.9 F (36.6 C)  TempSrc: Oral Oral Oral Axillary  SpO2: 92% 95% 91% 90%  Weight:      Height:        Intake/Output Summary (Last 24 hours) at 10/16/2019 1016 Last data filed at 10/16/2019 0600 Gross per 24 hour  Intake --  Output 1475 ml  Net -1475 ml   Filed Weights   10/12/19 0341 10/13/19 0429 10/15/19 0439  Weight: 100.5 kg 101.2 kg 100.6 kg     Data Reviewed:   CBC: Recent Labs  Lab 10/12/19 0440 10/12/19 0440 10/12/19 1006 10/13/19 0435 10/14/19 0313 10/15/19 0253 10/16/19 0332  WBC 6.0  --   --  7.2 7.6 9.6 9.6  HGB 12.2   < > 10.2* 12.4 12.7 13.2 12.4  HCT 39.8   < > 30.0* 40.4 40.4 41.4 39.4  MCV 104.2*  --   --  103.1* 101.3* 101.2* 101.5*  PLT 165  --   --  173 182 173 160   < > = values in this interval not displayed.   Basic Metabolic Panel: Recent Labs  Lab 10/13/19 0435 10/14/19 0313 10/15/19 0020 10/15/19 0253 10/16/19 0332  NA 147* 144 141 143 140  K 4.4 4.5 4.4 4.0 4.6  CL 106 106 103 101 102  CO2 31 27 29 27 28   GLUCOSE 170* 201* 81 88 91  BUN 94* 87* 78*  78* 72*  CREATININE 1.04* 1.03* 0.96 0.98 0.90  CALCIUM 8.4* 8.5* 8.3* 8.0* 8.1*  MG 2.4 2.4 2.1 2.2 2.3   GFR: Estimated Creatinine Clearance: 71.1 mL/min (by C-G formula based on SCr of 0.9 mg/dL). Liver Function Tests: No results for input(s): AST, ALT, ALKPHOS, BILITOT, PROT, ALBUMIN in the last 168 hours. No results for input(s): LIPASE, AMYLASE in the last 168 hours. No results for input(s): AMMONIA in the last 168 hours. Coagulation Profile: No results for input(s): INR, PROTIME in the last 168 hours. Cardiac Enzymes: No results for input(s): CKTOTAL, CKMB, CKMBINDEX, TROPONINI in the last 168 hours. BNP (last 3 results) No results for input(s): PROBNP in the last 8760 hours. HbA1C: No results for input(s): HGBA1C in the last 72 hours. CBG: Recent Labs  Lab 10/15/19 1546 10/15/19 1916 10/15/19 2308 10/16/19 0004 10/16/19 0718  GLUCAP 121* 145* 98 93 178*   Lipid Profile: No results for input(s): CHOL, HDL, LDLCALC, TRIG, CHOLHDL, LDLDIRECT in the last 72 hours. Thyroid Function Tests: No results for input(s): TSH, T4TOTAL, FREET4, T3FREE, THYROIDAB in the last 72 hours. Anemia Panel: No results for input(s): VITAMINB12, FOLATE, FERRITIN, TIBC, IRON, RETICCTPCT in the last 72 hours. Sepsis Labs: No results for input(s): PROCALCITON, LATICACIDVEN in the last 168 hours.  No results found for this or any  previous visit (from the past 240 hour(s)).       Radiology Studies: No results found.      Scheduled Meds: . apixaban  5 mg Per Tube BID  . buPROPion  100 mg Per Tube TID  . carvedilol  3.125 mg Oral BID  . chlorhexidine  15 mL Mouth/Throat BID  . Chlorhexidine Gluconate Cloth  6 each Topical Daily  . famotidine  20 mg Per Tube Daily  . free water  200 mL Per Tube Q6H  . furosemide  20 mg Oral Daily  . gabapentin  300 mg Per Tube Q8H  . Gerhardt's butt cream   Topical Daily  . hydrALAZINE  50 mg Oral TID  . influenza vaccine adjuvanted  0.5 mL  Intramuscular Tomorrow-1000  . insulin aspart  0-15 Units Subcutaneous Q4H  . insulin aspart  5 Units Subcutaneous Q4H  . insulin detemir  10 Units Subcutaneous Daily  . ipratropium  2 puff Inhalation Q8H  . levalbuterol  1-2 puff Inhalation Q8H  . levothyroxine  175 mcg Per Tube Daily  . mouth rinse  15 mL Mouth Rinse 10 times per day  . pneumococcal 23 valent vaccine  0.5 mL Intramuscular Tomorrow-1000  . sodium chloride flush  10-40 mL Intracatheter Q12H   Continuous Infusions: . sodium chloride 250 mL (10/05/19 1208)  . feeding supplement (VITAL AF 1.2 CAL) 80 mL/hr at 10/16/19 0600     LOS: 12 days   Time spent= 25 mins    Marlia Schewe Arsenio Loader, MD [facility]  If 7PM-7AM, please contact night-coverage  10/16/2019, 10:16 AM

## 2019-10-17 ENCOUNTER — Inpatient Hospital Stay (HOSPITAL_COMMUNITY): Payer: Medicare Other

## 2019-10-17 LAB — BASIC METABOLIC PANEL
Anion gap: 9 (ref 5–15)
BUN: 61 mg/dL — ABNORMAL HIGH (ref 8–23)
CO2: 27 mmol/L (ref 22–32)
Calcium: 8 mg/dL — ABNORMAL LOW (ref 8.9–10.3)
Chloride: 103 mmol/L (ref 98–111)
Creatinine, Ser: 0.89 mg/dL (ref 0.44–1.00)
GFR calc Af Amer: >60 mL/min (ref >60)
GFR calc non Af Amer: >60 mL/min (ref >60)
Glucose, Bld: 72 mg/dL (ref 70–99)
Potassium: 4.5 mmol/L (ref 3.5–5.1)
Sodium: 139 mmol/L (ref 135–145)

## 2019-10-17 LAB — CBC
HCT: 39.3 % (ref 36.0–46.0)
Hemoglobin: 12.6 g/dL (ref 12.0–15.0)
MCH: 32.4 pg (ref 26.0–34.0)
MCHC: 32.1 g/dL (ref 30.0–36.0)
MCV: 101 fL — ABNORMAL HIGH (ref 80.0–100.0)
Platelets: 154 10*3/uL (ref 150–400)
RBC: 3.89 MIL/uL (ref 3.87–5.11)
RDW: 16.6 % — ABNORMAL HIGH (ref 11.5–15.5)
WBC: 9.3 10*3/uL (ref 4.0–10.5)
nRBC: 0 % (ref 0.0–0.2)

## 2019-10-17 LAB — GLUCOSE, CAPILLARY
Glucose-Capillary: 113 mg/dL — ABNORMAL HIGH (ref 70–99)
Glucose-Capillary: 142 mg/dL — ABNORMAL HIGH (ref 70–99)
Glucose-Capillary: 145 mg/dL — ABNORMAL HIGH (ref 70–99)
Glucose-Capillary: 147 mg/dL — ABNORMAL HIGH (ref 70–99)
Glucose-Capillary: 177 mg/dL — ABNORMAL HIGH (ref 70–99)
Glucose-Capillary: 179 mg/dL — ABNORMAL HIGH (ref 70–99)
Glucose-Capillary: 79 mg/dL (ref 70–99)

## 2019-10-17 LAB — FOLATE RBC
Folate, Hemolysate: 391 ng/mL
Folate, RBC: 1013 ng/mL (ref >498)
Hematocrit: 38.6 % (ref 34.0–46.6)

## 2019-10-17 LAB — T4, FREE: Free T4: 1.08 ng/dL (ref 0.61–1.12)

## 2019-10-17 LAB — MAGNESIUM: Magnesium: 2.2 mg/dL (ref 1.7–2.4)

## 2019-10-17 MED ORDER — FUROSEMIDE 10 MG/ML IJ SOLN
40.0000 mg | Freq: Once | INTRAMUSCULAR | Status: AC
  Administered 2019-10-17: 40 mg via INTRAVENOUS
  Filled 2019-10-17: qty 4

## 2019-10-17 NOTE — Progress Notes (Signed)
Occupational Therapy Treatment Patient Details Name: Debra Mcconnell MRN: [id number] DOB: [birth year] Today's Date: 10/17/2019    History of present illness Pt is a 74 y.o. female admitted 10/04/19 for acute respiratory distress secondary to COVID-19 viral PNA; ETT 1/26-2/3. Also worked up for myocarditis, AKI, combined septic and cardiogenic shock, aflutter, splenic infarct. PMH includes stroke, HTN, DM2, neuropathy, aflutter, arthritis, bilateral TKA, bilateral hammer toe.   OT comments  Able to progress OOB to recliner with use of Maximove. VSS during session. Pt more alert and inconsistently following commands. Pt perseverates and demonstrates difficulty with sustained attention and word finding in addition to deficits listed below. Able to complete grooming at seated level with mod A and feed self applesauce with max A. Nsg made aware of diet difference and ST to see pt today. Attempted to call [name] at end of session - no answer. Will continiue to follow acutely. Recommend SNF for rehab at this time.   Follow Up Recommendations  SNF;Supervision/Assistance - 24 hour    Equipment Recommendations  Other (comment)    Recommendations for Other Services      Precautions / Restrictions Precautions Precautions: Fall;Other (comment) Precaution Comments: cortrak, sacral wound Restrictions Weight Bearing Restrictions: No       Mobility Bed Mobility Overal bed mobility: Needs Assistance Bed Mobility: Rolling Rolling: Max assist;+2 for physical assistance         General bed mobility comments: Performed multiple bouts of rolling R/L for washup, linen change and pad placement; pt with improved initiation rolling towards L-side, reaching for rail; minimal (if any) initiation rolling towards R-side, maxA+2  Transfers Overall transfer level: Needs assistance               General transfer comment: Transferred from bed to recliner with use of maxinive lift; pt tolerated great.  Post-transfer BP 113/69    Balance Overall balance assessment: Needs assistance   Sitting balance-Leahy Scale: Poor                                     ADL either performed or assessed with clinical judgement   ADL Overall ADL's : Needs assistance/impaired Eating/Feeding: Maximal assistance Eating/Feeding Details (indicate cue type and reason): hand over hand with eating applesauce; VC to maintain attneiton to task; Difficulty mantining grasp on spoon Grooming: Moderate assistance;Wash/dry face;Oral care;Maximal assistance(Max for oral care)                               Functional mobility during ADLs: +2 for physical assistance;Total assistance(used lift)       Vision       Perception     Praxis      Cognition Arousal/Alertness: Lethargic Behavior During Therapy: Flat affect Overall Cognitive Status: Impaired/Different from baseline Area of Impairment: Orientation;Attention;Memory;Following commands;Safety/judgement;Awareness;Problem solving                 Orientation Level: Disoriented to;Place;Situation Current Attention Level: Focused(moments of sustained attention when feedingwashing face) Memory: Decreased short-term memory Following Commands: Follows one step commands inconsistently;Follows one step commands with increased time Safety/Judgement: Decreased awareness of safety;Decreased awareness of deficits Awareness: Intellectual Problem Solving: Slow processing;Decreased initiation;Difficulty sequencing;Requires verbal cues;Requires tactile cues General Comments: Limited by very poor attention and decreased initiation; difficulty following commands. Perseverating on wanting coffee or water. Repetitive with short responses. "I need to see my dog"  Exercises     Shoulder Instructions       General Comments SpO2 92-96% on RA. Expressed concerns to RN regarding SLP order for Dys 3 diet, but current orders with Dys 1.  Cognitive impairment may warrant head imaging (RN to message MD)    Pertinent Vitals/ Pain       Pain Assessment: Faces Faces Pain Scale: Hurts a little bit Pain Location: BLEs with rolling and washup Pain Descriptors / Indicators: Grimacing;Moaning Pain Intervention(s): Limited activity within patient's tolerance  Home Living                                          Prior Functioning/Environment              Frequency  Min 2X/week        Progress Toward Goals  OT Goals(current goals can now be found in the care plan section)  Progress towards OT goals: Progressing toward goals  Acute Rehab OT Goals Patient Stated Goal: to get better OT Goal Formulation: Patient unable to participate in goal setting Time For Goal Achievement: 10/28/19 Potential to Achieve Goals: Good ADL Goals Pt Will Perform Eating: with min assist;sitting Pt Will Perform Grooming: with min assist;sitting Pt Will Perform Upper Body Bathing: with min assist;sitting Pt Will Perform Lower Body Bathing: with mod assist;bed level Pt Will Transfer to Toilet: with +2 assist;with max assist;bedside commode Pt Will Perform Toileting - Clothing Manipulation and hygiene: with max assist;sitting/lateral leans Additional ADL Goal #1: Pt will demonstrate selective attention during ADL task with min redirectional cuing  Plan Discharge plan remains appropriate    Co-evaluation    PT/OT/SLP Co-Evaluation/Treatment: Yes Reason for Co-Treatment: Complexity of the patient's impairments (multi-system involvement);For patient/therapist safety;To address functional/ADL transfers   OT goals addressed during session: ADL's and self-care;Strengthening/ROM      AM-PAC OT "6 Clicks" Daily Activity     Outcome Measure   Help from another person eating meals?: A Lot Help from another person taking care of personal grooming?: A Lot Help from another person toileting, which includes using toliet,  bedpan, or urinal?: Total Help from another person bathing (including washing, rinsing, drying)?: A Lot Help from another person to put on and taking off regular upper body clothing?: Total Help from another person to put on and taking off regular lower body clothing?: Total 6 Click Score: 9    End of Session    OT Visit Diagnosis: Muscle weakness (generalized) (M62.81)   Activity Tolerance Patient tolerated treatment well   Patient Left in chair;with call bell/phone within reach;with chair alarm set   Nurse Communication Mobility status;Other (comment);Need for lift equipment(cognitive concerns)        Time: [id number] OT Time Calculation (min): 53 min  Charges: OT General Charges $OT Visit: 1 Visit OT Treatments $Self Care/Home Management : 23-37 mins  Maurie Boettcher, OT/L   Acute OT Clinical Specialist [place] Pager [phone] Office [phone]    [facility] 10/17/2019, 1:49 PM

## 2019-10-17 NOTE — Plan of Care (Signed)
Pt A&Ox1 to self only. Very forgetful & unable to reorient. VSS, SpO2 >88% on RA. Dobhoff remains bridled & intact. Vital AF 1.2 running at 80cc/hr w/ 200cc FWF Q6, tolerating feeds well. Encouraging PO intake. Appetite improved from yesterday. No c/o pain throughout shift. Bgs stable w/ sliding scale coverage. T&R q2  OOBTC w/ maxilift for assistance, pt tolerated transition well. Complete linen change x2  Utilizing purewick w/ adequate urine output   CT completed, see results for details  All safety measures in place, will report to oncoming shift.   Tomie China    Problem: Education: Goal: Knowledge of risk factors and measures for prevention of condition will improve Outcome: Progressing   Problem: Coping: Goal: Psychosocial and spiritual needs will be supported Outcome: Progressing   Problem: Respiratory: Goal: Will maintain a patent airway Outcome: Progressing Goal: Complications related to the disease process, condition or treatment will be avoided or minimized Outcome: Progressing   Problem: Education: Goal: Knowledge of General Education information will improve Description: Including pain rating scale, medication(s)/side effects and non-pharmacologic comfort measures Outcome: Progressing   Problem: Health Behavior/Discharge Planning: Goal: Ability to manage health-related needs will improve Outcome: Progressing   Problem: Clinical Measurements: Goal: Ability to maintain clinical measurements within normal limits will improve Outcome: Progressing Goal: Will remain free from infection Outcome: Progressing Goal: Diagnostic test results will improve Outcome: Progressing Goal: Respiratory complications will improve Outcome: Progressing Goal: Cardiovascular complication will be avoided Outcome: Progressing   Problem: Activity: Goal: Risk for activity intolerance will decrease Outcome: Progressing   Problem: Nutrition: Goal: Adequate nutrition will be  maintained Outcome: Progressing   Problem: Coping: Goal: Level of anxiety will decrease Outcome: Progressing   Problem: Elimination: Goal: Will not experience complications related to bowel motility Outcome: Progressing Goal: Will not experience complications related to urinary retention Outcome: Progressing   Problem: Pain Managment: Goal: General experience of comfort will improve Outcome: Progressing   Problem: Safety: Goal: Ability to remain free from injury will improve Outcome: Progressing   Problem: Skin Integrity: Goal: Risk for impaired skin integrity will decrease Outcome: Progressing

## 2019-10-17 NOTE — TOC Progression Note (Signed)
Transition of Care [facility]) - Progression Note    Patient Details  Name: Debra Mcconnell MRN: [id number] Date of Birth: 1945-12-23  [facility] CM/SW Contact  Joaquin Courts, RN Phone Number: 10/17/2019, 12:21 PM  Clinical Narrative:    CM received message from [place] that patient's POA has selected [facility] and had requested CM speak with patient about this arrangement.  CM spoke with patient over the telephone, however patient has difficulty hearing and remained disoriented.  Patient not oriented to location time or situation.  CM spoke with POA Debra Mcconnell and relayed this information. CM also confirmed SNF selection and that Debra Mcconnell is POA and will be able to sign admission paperwork if needed.  Debra Mcconnell confirms all of this.  CM spoke with [facility] rep and informed of facility selection. CM will continue to follow and coordinate discharge when medically stable.   Expected Discharge Plan: [place] Barriers to Discharge: Continued Medical Work up  Expected Discharge Plan and Services Expected Discharge Plan: [place] Choice: [place] arrangements for the past 2 months: Single Family Home                                       Social Determinants of Health (SDOH) Interventions    Readmission Risk Interventions No flowsheet data found.

## 2019-10-17 NOTE — Progress Notes (Addendum)
Physical Therapy Treatment Patient Details Name: Debra Mcconnell MRN: [id number] DOB: February 18, 1946 Today's Date: 10/17/2019    History of Present Illness Pt is a 74 y.o. female admitted 10/04/19 for acute respiratory distress secondary to COVID-19 viral PNA; ETT 1/26-2/3. Also worked up for myocarditis, AKI, combined septic and cardiogenic shock, aflutter, splenic infarct. PMH includes stroke, HTN, DM2, neuropathy, aflutter, arthritis, bilateral TKA, bilateral hammer toe.   PT Comments    Pt more alert this session; noted to have some movement initiation with max verbal cues, inconsistently following commands, limited by poor attention and impaired problem solving. MaxA+2 for bed mobility; tolerated transfer to recliner with maximove lift; geomat placed due to sacral wound. Pt pleasant and agreeable. Continue to recommend SNF-level therapies to maximize functional mobility and independence.  Post-transfer BP 113/69 SpO2 92-96% on RA   Follow Up Recommendations  SNF;Supervision/Assistance - 24 hour     Equipment Recommendations  Wheelchair (measurements PT);Wheelchair cushion (measurements PT);Hospital bed    Recommendations for Other Services       Precautions / Restrictions Precautions Precautions: Fall;Other (comment) Precaution Comments: cortrak, sacral wound Restrictions Weight Bearing Restrictions: No    Mobility  Bed Mobility Overal bed mobility: Needs Assistance Bed Mobility: Rolling Rolling: Max assist;+2 for physical assistance         General bed mobility comments: Performed multiple bouts of rolling R/L for washup, linen change and pad placement; pt with improved initiation rolling towards L-side, reaching for rail; minimal (if any) initiation rolling towards R-side, maxA+2  Transfers Overall transfer level: Needs assistance               General transfer comment: Transferred from bed to recliner with use of maxisky lift; pt tolerated great. Post-transfer BP  113/69  Ambulation/Gait                 Stairs             Wheelchair Mobility    Modified Rankin (Stroke Patients Only)       Balance Overall balance assessment: Needs assistance   Sitting balance-Leahy Scale: Poor                                      Cognition Arousal/Alertness: Lethargic Behavior During Therapy: Flat affect Overall Cognitive Status: No family/caregiver present to determine baseline cognitive functioning Area of Impairment: Orientation;Attention;Memory;Following commands;Safety/judgement;Awareness;Problem solving                 Orientation Level: Disoriented to;Place;Time;Situation Current Attention Level: Focused Memory: Decreased short-term memory Following Commands: Follows one step commands inconsistently;Follows one step commands with increased time Safety/Judgement: Decreased awareness of safety;Decreased awareness of deficits Awareness: Intellectual Problem Solving: Slow processing;Decreased initiation;Difficulty sequencing;Requires verbal cues;Requires tactile cues General Comments: Limited by very poor attention and decreased initiation; difficulty following commands. Perseverating on wanting coffee or water. Repetitive with short responses. "I need to see my dog"      Exercises      General Comments General comments (skin integrity, edema, etc.): SpO2 92-96% on RA. Expressed concerns to RN regarding SLP order for Dys 3 diet, but current orders with Dys 1. Cognitive impairment may warrant head imaging (RN to message MD)      Pertinent Vitals/Pain Pain Assessment: Faces Faces Pain Scale: Hurts a little bit Pain Location: BLEs with rolling and washup Pain Descriptors / Indicators: Grimacing;Moaning Pain Intervention(s): Monitored during session;Repositioned    Home  Living                      Prior Function            PT Goals (current goals can now be found in the care plan section)  Progress towards PT goals: Progressing toward goals    Frequency    Min 2X/week      PT Plan Current plan remains appropriate    Co-evaluation              AM-PAC PT "6 Clicks" Mobility   Outcome Measure  Help needed turning from your back to your side while in a flat bed without using bedrails?: Total Help needed moving from lying on your back to sitting on the side of a flat bed without using bedrails?: Total Help needed moving to and from a bed to a chair (including a wheelchair)?: Total Help needed standing up from a chair using your arms (e.g., wheelchair or bedside chair)?: Total Help needed to walk in hospital room?: Total Help needed climbing 3-5 steps with a railing? : Total 6 Click Score: 6    End of Session   Activity Tolerance: Patient tolerated treatment well Patient left: in chair;with call bell/phone within reach;with chair alarm set Nurse Communication: Mobility status;Need for lift equipment;Other (comment) PT Visit Diagnosis: Muscle weakness (generalized) (M62.81);Difficulty in walking, not elsewhere classified (R26.2)     Time: [id number] PT Time Calculation (min) (ACUTE ONLY): 45 min  Charges:  $Therapeutic Activity: 23-37 mins                     Mabeline Caras, PT, DPT Acute Rehabilitation Services  Pager [phone] Office [place] 10/17/2019, 1:17 PM

## 2019-10-17 NOTE — Progress Notes (Addendum)
PROGRESS NOTE    Debra Mcconnell  [id number] DOB: Mar 24, 1946 DOA: 10/04/2019 PCP: Greig Right, MD   Brief Narrative:  74 year old with history of DM2, CVA, HTN, HLD, atrial flutter, hypothyroidism, OSA presented from [place] with diagnosis of COVID-19/hypoxia, acute kidney injury and hyperglycemia.  CTA chest was negative for PE, CT head, negative.  She required intubation for hypoxic respiratory failure on 1/26 then eventually extubated on 2/3.  Echocardiogram showed new diagnosis of CHF with a EF of 25-30% with global hypokinesia concerning for myocarditis.  Received steroids, completed remdesivir.  Transferred out of the ICU to [facility] service.  Failed speech and swallow therefore core track in place.  I am hopeful this acute dysphagia will improve and will slowly be able to advance her diet.  Lantus dose reduced due to low normal blood sugars.   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 -Extubated 2/3.  Moderate aspiration risk slowly progressing.  Speech therapy working with her. -Core track in place with tube feeding. -PT/OT-SNF -Remdesivir-completed 1/30 -Decadron -Bronchodilators- Xopenex. IS/ Flutter -Incentive spirometer flutter valve.  Monitor inflammatory markers. -Chest x-ray shows hazy opacity with some congestion.  Will give additional 40 mg 1 dose IV Lasix  Intermittent dysarthria -Obtain CT of the head.  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  Continue Coreg, and hydralazine Lasix transition to 20 mg daily.  Isosorbide dinitrate added. -Repeat echo in 3 weeks and follow-up in 1 month  Acute kidney injury -Resolved  Macrocytic anemia -Check TSH, B12 and folate  Mild transaminitis -Suspect secondary to congestion.  Stable  Diabetes mellitus type 2 with peripheral neuropathy, well controlled. -Insulin sliding scale Accu-Chek.  Hemoglobin A1c 7 -Lantus 10 units daily -Continue gabapentin  Essential hypertension -Hydralazine 50 mg 3 times daily.  Coreg 3.125 mg twice daily.  Daily oral Lasix.  Isosorbide mononitrate.  History of atrial flutter History of splenic infarcts -Eliquis.  Obstructive sleep apnea -CPAP or BiPAP nightly  Hypothyroidism -Synthroid 175 mcg daily  History of depression/anxiety -Continue Wellbutrin  Acute urinary retention -Improved  Intermittent confusion and delirium -Stable at this time.  Continue to monitor her.  No focal neuro deficits.  Central line removed 2/4  DVT prophylaxis: Eliquis Code Status: DNR, no intubation Family Communication:   Disposition Plan: Maintain hospital stay until placement  [facility]  Patient Anticipated D/C place= eventually will go back to SNF  Barriers= ongoing tube feeds.  Hopeful and improvement of her acute dysphagia, if passes speech and swallow will remove her feeding tube.  Also concern for dysarthria.  CT head ordered  Subjective: Slightly disoriented this morning but answering all the basic questions.  She was doing okay with me but later on while working with occupational therapy they noted she was having some dysarthria.  Review of Systems Otherwise negative except as per HPI, including: General = no fevers, chills, dizziness,  fatigue HEENT/EYES = negative for loss of vision, double vision, blurred vision,  sore  throa Cardiovascular= negative for chest pain, palpitation Respiratory/lungs= negative for shortness of breath, cough, wheezing; hemoptysis,  Gastrointestinal= negative for nausea, vomiting, abdominal pain Genitourinary= negative for Dysuria MSK = Negative for arthralgia, myalgias Neurology= Negative for headache, numbness, tingling  Psychiatry= Negative for suicidal and homocidal ideation Skin= Negative for Rash  Examination: Constitutional: Elderly frail, chronically ill.  Feeding tube in place Respiratory: Clear to auscultation bilaterally Cardiovascular: Normal sinus rhythm, no rubs Abdomen: Nontender nondistended good bowel sounds Musculoskeletal: No edema noted Skin: No rashes seen Neurologic: CN 2-12 grossly intact.  And nonfocal.  Some word finding difficulty with Occupational Therapy as she worked with them  Psychiatric: Alert to name and place.  Poor judgment and insight  Objective: Vitals:   10/17/19 0008 10/17/19 0405 10/17/19 0700 10/17/19 1136  BP: (!) 104/48 131/67 (!) 111/58 119/75  Pulse: 67 61 62 65  Resp: (!) 24 (!) 22 (!) 22 (!) 22  Temp: 99.4 F (37.4 C) 98.9 F (37.2 C) 98.6 F (37 C) 98 F (36.7 C)  TempSrc: Oral  Oral Oral  SpO2: 92% 93% 92% 96%  Weight:      Height:        Intake/Output Summary (Last 24 hours) at 10/17/2019 1319 Last data filed at 10/17/2019 0557 Gross per 24 hour  Intake --  Output 1050 ml  Net -1050 ml   Filed Weights   10/12/19 0341 10/13/19 0429 10/15/19 0439  Weight: 100.5 kg 101.2 kg 100.6 kg     Data Reviewed:   CBC: Recent Labs  Lab 10/13/19 0435 10/14/19 0313 10/15/19 0253 10/16/19 0332 10/17/19 0413  WBC 7.2 7.6 9.6 9.6 9.3  HGB 12.4 12.7 13.2 12.4 12.6  HCT 40.4 40.4 41.4 39.4 39.3  MCV 103.1* 101.3* 101.2* 101.5* 101.0*  PLT 173 182 173 160 [id number]   Basic Metabolic Panel: Recent Labs  Lab 10/14/19 0313 10/15/19 0020 10/15/19 0253 10/16/19 0332 10/17/19 0413  NA 144 141 143 140 139  K 4.5 4.4 4.0  4.6 4.5  CL 106 103 101 102 103  CO2 27 29 27 28 27   GLUCOSE 201* 81 88 91 72  BUN 87* 78* 78* 72* 61*  CREATININE 1.03* 0.96 0.98 0.90 0.89  CALCIUM 8.5* 8.3* 8.0* 8.1* 8.0*  MG 2.4 2.1 2.2 2.3 2.2   GFR: Estimated Creatinine Clearance: 71.9 mL/min (by C-G formula based on SCr of 0.89 mg/dL). Liver Function Tests: No results for input(s): AST, ALT, ALKPHOS, BILITOT, PROT, ALBUMIN in the last 168 hours. No results for input(s): LIPASE, AMYLASE in the last 168 hours. No results for input(s): AMMONIA in the last 168 hours. Coagulation Profile: No results for input(s): INR, PROTIME in the last 168 hours. Cardiac Enzymes: No results for input(s): CKTOTAL, CKMB, CKMBINDEX, TROPONINI in the last 168 hours. BNP (last 3 results) No results for input(s): PROBNP in the last 8760 hours. HbA1C: No results for input(s): HGBA1C in the last 72 hours. CBG: Recent Labs  Lab 10/16/19 2053 10/17/19 0014 10/17/19 0418 10/17/19 0740 10/17/19 1135  GLUCAP 206* 113* 79 142* 177*   Lipid Profile: No results for input(s): CHOL, HDL, LDLCALC, TRIG, CHOLHDL, LDLDIRECT in the last 72 hours. Thyroid Function Tests: Recent Labs    10/16/19 0840 10/17/19 0413  TSH 6.312*  --   FREET4  --  1.08   Anemia Panel: Recent Labs    10/16/19 0840  VITAMINB12 627   Sepsis Labs: No results for input(s): PROCALCITON, LATICACIDVEN in the last 168 hours.  No results  found for this or any previous visit (from the past 240 hour(s)).       Radiology Studies: DG Chest Port 1 View  Result Date: 10/17/2019 CLINICAL DATA:  Dyspnea. EXAM: PORTABLE CHEST 1 VIEW COMPARISON:  10/12/2019 FINDINGS: Patient is rotated towards the left. Persistent densities or consolidation at the left lung base. Heart size is grossly stable but difficult to evaluate the size based on the patient rotation. Feeding tube extends into the abdomen. Again noted are prominent interstitial lung densities. Atherosclerotic calcifications at  the aortic arch. IMPRESSION: 1. Persistent opacification at the left lung base. Findings are suggestive for consolidation or pleural fluid at the left lung base. 2. Prominent interstitial lung densities have minimally changed. Findings are suggestive for mild interstitial edema. 3. Feeding tube extends in the abdomen but the tip is beyond the image. Electronically Signed   By: Markus Daft M.D.   On: 10/17/2019 08:54        Scheduled Meds:  apixaban  5 mg Per Tube BID   buPROPion  100 mg Per Tube TID   carvedilol  3.125 mg Oral BID   chlorhexidine  15 mL Mouth/Throat BID   Chlorhexidine Gluconate Cloth  6 each Topical Daily   famotidine  20 mg Per Tube Daily   free water  200 mL Per Tube Q6H   furosemide  20 mg Oral Daily   gabapentin  300 mg Per Tube Q8H   Gerhardt's butt cream   Topical Daily   hydrALAZINE  50 mg Oral TID   influenza vaccine adjuvanted  0.5 mL Intramuscular Tomorrow-1000   insulin aspart  0-15 Units Subcutaneous Q4H   insulin aspart  5 Units Subcutaneous Q4H   insulin detemir  10 Units Subcutaneous Daily   ipratropium  2 puff Inhalation Q8H   levalbuterol  1-2 puff Inhalation Q8H   levothyroxine  175 mcg Per Tube Daily   mouth rinse  15 mL Mouth Rinse 10 times per day   pneumococcal 23 valent vaccine  0.5 mL Intramuscular Tomorrow-1000   sodium chloride flush  10-40 mL Intracatheter Q12H   Continuous Infusions:  sodium chloride 250 mL (10/05/19 1208)   feeding supplement (VITAL AF 1.2 CAL) 80 mL/hr at 10/16/19 0600     LOS: 13 days   Time spent= 35 mins    Adrianna Dudas Arsenio Loader, MD [facility]  If 7PM-7AM, please contact night-coverage  10/17/2019, 1:19 PM

## 2019-10-17 NOTE — Progress Notes (Signed)
  Speech Language Pathology Treatment: Dysphagia  Patient Details Name: Debra Mcconnell MRN: [id number] DOB: [birth year] Today's Date: 10/17/2019 Time: 1346-1400 SLP Time Calculation (min) (ACUTE ONLY): 14 min  Assessment / Plan / Recommendation Clinical Impression  She required moderate verbal and tactile stimuli to remain awake from sleeping state. Pt's prosody during speech appeared effortful and deliberate. Left hand/arms appears stronger but she did not initiate movement. Even with assist she could not remove spoon from oral cavity. Bites pudding were tolerated however sips water via cup resulted in immediate cough due to high suspicion of aspiration. Continue Dys 1 (puree) texture (had recommended floor stock purees initially) and modified to pudding thick liquids (nutrition should not send liquids on tray). Recommend MBS tomorrow to (likely afternoon) to evaluate oropharyngeal swallow integrity and ability to begin liquids. If sleepy, do not attempt to feed (pt has Cortrak).    HPI HPI: 74 year old with history of DM2, CVA, HTN, HLD, atrial flutter, hypothyroidism, OSA presented from [place] with diagnosis of COVID-19/hypoxia, acute kidney injury and hyperglycemia.  CTA chest was negative for PE, CT head, negative.  She required intubation for hypoxic respiratory failure on 1/26 then eventually extubated on 2/3.  Echocardiogram showed new diagnosis of CHF with a EF of 25-30% with global hypokinesia concerning for myocarditis.  Received steroids, completed remdesivir.  Transferred out of the ICU       SLP Plan  MBS;Continue with current plan of care       Recommendations  Diet recommendations: Dysphagia 1 (puree);Pudding-thick liquid Medication Administration: Via alternative means                Oral Care Recommendations: Oral care BID Follow up Recommendations: Skilled Nursing facility SLP Visit Diagnosis: Dysphagia, unspecified (R13.10) Plan: MBS;Continue with  current plan of care                       Houston Siren 10/17/2019, 2:11 PM  Orbie Pyo Colvin Caroli.Ed Risk analyst [phone] Office [phone]

## 2019-10-17 NOTE — Progress Notes (Signed)
Pt resting on RA at 93% and core track in place.  Cpap not being placed d/t confusion.  RT available if needed.

## 2019-10-18 ENCOUNTER — Inpatient Hospital Stay (HOSPITAL_COMMUNITY): Payer: Medicare Other

## 2019-10-18 LAB — GLUCOSE, CAPILLARY
Glucose-Capillary: 111 mg/dL — ABNORMAL HIGH (ref 70–99)
Glucose-Capillary: 138 mg/dL — ABNORMAL HIGH (ref 70–99)
Glucose-Capillary: 148 mg/dL — ABNORMAL HIGH (ref 70–99)
Glucose-Capillary: 165 mg/dL — ABNORMAL HIGH (ref 70–99)
Glucose-Capillary: 173 mg/dL — ABNORMAL HIGH (ref 70–99)
Glucose-Capillary: 183 mg/dL — ABNORMAL HIGH (ref 70–99)
Glucose-Capillary: 79 mg/dL (ref 70–99)

## 2019-10-18 LAB — BASIC METABOLIC PANEL
Anion gap: 8 (ref 5–15)
BUN: 57 mg/dL — ABNORMAL HIGH (ref 8–23)
CO2: 28 mmol/L (ref 22–32)
Calcium: 8.1 mg/dL — ABNORMAL LOW (ref 8.9–10.3)
Chloride: 103 mmol/L (ref 98–111)
Creatinine, Ser: 0.96 mg/dL (ref 0.44–1.00)
GFR calc Af Amer: >60 mL/min (ref >60)
GFR calc non Af Amer: 60 mL/min — ABNORMAL LOW (ref >60)
Glucose, Bld: 120 mg/dL — ABNORMAL HIGH (ref 70–99)
Potassium: 4.3 mmol/L (ref 3.5–5.1)
Sodium: 139 mmol/L (ref 135–145)

## 2019-10-18 LAB — CBC
HCT: 38.9 % (ref 36.0–46.0)
Hemoglobin: 12.2 g/dL (ref 12.0–15.0)
MCH: 31.6 pg (ref 26.0–34.0)
MCHC: 31.4 g/dL (ref 30.0–36.0)
MCV: 100.8 fL — ABNORMAL HIGH (ref 80.0–100.0)
Platelets: 142 10*3/uL — ABNORMAL LOW (ref 150–400)
RBC: 3.86 MIL/uL — ABNORMAL LOW (ref 3.87–5.11)
RDW: 16.5 % — ABNORMAL HIGH (ref 11.5–15.5)
WBC: 8.9 10*3/uL (ref 4.0–10.5)
nRBC: 0 % (ref 0.0–0.2)

## 2019-10-18 LAB — MAGNESIUM: Magnesium: 2.1 mg/dL (ref 1.7–2.4)

## 2019-10-18 LAB — BRAIN NATRIURETIC PEPTIDE: B Natriuretic Peptide: 333.2 pg/mL — ABNORMAL HIGH (ref 0.0–100.0)

## 2019-10-18 MED ORDER — GABAPENTIN 250 MG/5ML PO SOLN
200.0000 mg | Freq: Two times a day (BID) | ORAL | Status: DC
  Administered 2019-10-18 – 2019-10-19 (×2): 200 mg
  Filled 2019-10-18 (×3): qty 4

## 2019-10-18 NOTE — Progress Notes (Signed)
PROGRESS NOTE    Debra Mcconnell  [id number] DOB: 02-08-1946 DOA: 10/04/2019 PCP: Greig Right, MD   Brief Narrative:  74 year old with history of DM2, CVA, HTN, HLD, atrial flutter, hypothyroidism, OSA presented from [place] with diagnosis of COVID-19/hypoxia, acute kidney injury and hyperglycemia.  CTA chest was negative for PE, CT head, negative.  She required intubation for hypoxic respiratory failure on 1/26 then eventually extubated on 2/3.  Echocardiogram showed new diagnosis of CHF with a EF of 25-30% with global hypokinesia concerning for myocarditis.  Received steroids, completed remdesivir.  Transferred out of the ICU to [facility] service.  Failed speech and swallow therefore core track in place.  I am hopeful this acute dysphagia will improve and will slowly be able to advance her diet.  Lantus dose reduced due to low normal blood sugars.  Currently working with speech therapy, hopeful for acute dysphagia recovering post extubation so her feeding tube can be removed.   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 -Extubated 2/3.  Moderate aspiration risk, slowly progressing.  Speech therapy following.  Plans for MBS later today. -Maintain feeding tube in place for now, once cleared by speech this can be removed.  It will help with disposition planning as well. -PT/OT-SNF -Remdesivir-completed 1/30 -Decadron -Bronchodilators- Xopenex. IS/ Flutter -Incentive spirometer flutter valve.   Monitor inflammatory markers.  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  Continue Coreg, and hydralazine Lasix transition to 20 mg daily.  Isosorbide dinitrate added. -Repeat echo in 3 weeks and follow-up in 1 month  Acute kidney injury -Resolved  Macrocytic anemia -TSH elevated but T4 normal.  B12 and folate within normal limits.  Mild transaminitis -Suspect secondary to congestion.  Stable  Diabetes mellitus type 2 with peripheral neuropathy, better controlled now. -Insulin sliding scale Accu-Chek.  Hemoglobin A1c 7 -Lantus 10 units daily -Continue gabapentin-dose reduced to 200 mg twice daily  Essential hypertension -Hydralazine 50 mg 3 times daily.  Coreg 3.125 mg twice daily.  Lasix 20 mg daily isosorbide mononitrate.  History of atrial flutter History of splenic infarcts -Eliquis.  Obstructive sleep apnea -CPAP or BiPAP nightly  Hypothyroidism -Synthroid 175 mcg daily  History of depression/anxiety -Continue Wellbutrin  Acute urinary retention -Improved  Intermittent confusion and delirium -Stable at this time.  Continue to monitor her.  No focal neuro deficits.  Central line removed 2/4  DVT prophylaxis: Eliquis Code Status: DNR, no intubation Family Communication:   Disposition Plan: Maintain hospital stay until placement  Patient [facility]  Patient Anticipated D/C place= eventually plans for skilled nursing facility  Barriers= maintain tube feeds for now.  For acute dysphagia she has feeding tube in place, once this has been addressed she will be transition to skilled nursing facility.  Subjective: Awake this morning feels slightly drowsy but overall better.  Denies any complaints.  Review of  Systems Otherwise negative  except as per HPI, including: General = no fevers, chills, dizziness,  fatigue HEENT/EYES = negative for loss of vision, double vision, blurred vision,  sore throa Cardiovascular= negative for chest pain, palpitation Respiratory/lungs= negative for shortness of breath, cough, wheezing; hemoptysis,  Gastrointestinal= negative for nausea, vomiting, abdominal pain Genitourinary= negative for Dysuria MSK = Negative for arthralgia, myalgias Neurology= Negative for headache, numbness, tingling  Psychiatry= Negative for suicidal and homocidal ideation Skin= Negative for Rash   Examination: Constitutional: Slightly drowsy but not in any acute distress.  Feeding tube in place.  Appears chronically ill Respiratory: Minimal bibasilar rhonchi Cardiovascular: Normal sinus rhythm, no rubs Abdomen: Nontender nondistended good bowel sounds Musculoskeletal: No edema noted.  Strength in all 4 extremities 4+/5 Skin: No rashes seen Neurologic: CN 2-12 grossly intact.  And nonfocal Psychiatric: Alert awake oriented X2.  Slightly poor judgment and insight    Objective: Vitals:   10/18/19 0000 10/18/19 0424 10/18/19 0500 10/18/19 0708  BP: 115/64 126/66  137/73  Pulse: 65 67  66  Resp: (!) 25 (!) 24  (!) 23  Temp: 97.9 F (36.6 C) 98 F (36.7 C)  97.8 F (36.6 C)  TempSrc: Axillary Oral  Oral  SpO2: 94% 94%  91%  Weight:   100.3 kg   Height:        Intake/Output Summary (Last 24 hours) at 10/18/2019 1033 Last data filed at 10/18/2019 0400 Gross per 24 hour  Intake 1040 ml  Output 600 ml  Net 440 ml   Filed Weights   10/13/19 0429 10/15/19 0439 10/18/19 0500  Weight: 101.2 kg 100.6 kg 100.3 kg     Data Reviewed:   CBC: Recent Labs  Lab 10/14/19 0313 10/14/19 0313 10/15/19 0253 10/16/19 0332 10/16/19 0840 10/17/19 0413 10/18/19 0347  WBC 7.6  --  9.6 9.6  --  9.3 8.9  HGB 12.7  --  13.2 12.4  --  12.6 12.2  HCT 40.4   < > 41.4 39.4 38.6 39.3 38.9    MCV 101.3*  --  101.2* 101.5*  --  101.0* 100.8*  PLT 182  --  173 160  --  154 142*   < > = values in this interval not displayed.   Basic Metabolic Panel: Recent Labs  Lab 10/15/19 0020 10/15/19 0253 10/16/19 0332 10/17/19 0413 10/18/19 0347  NA 141 143 140 139 139  K 4.4 4.0 4.6 4.5 4.3  CL 103 101 102 103 103  CO2 29 27 28 27 28   GLUCOSE 81 88 91 72 120*  BUN 78* 78* 72* 61* 57*  CREATININE 0.96 0.98 0.90 0.89 0.96  CALCIUM 8.3* 8.0* 8.1* 8.0* 8.1*  MG 2.1 2.2 2.3 2.2 2.1   GFR: Estimated Creatinine Clearance: 66.6 mL/min (by C-G formula based on SCr of 0.96 mg/dL). Liver Function Tests: No results for input(s): AST, ALT, ALKPHOS, BILITOT, PROT, ALBUMIN in the last 168 hours. No results for input(s): LIPASE, AMYLASE in the last 168 hours. No results for input(s): AMMONIA in the last 168 hours. Coagulation Profile: No results for input(s): INR, PROTIME in the last 168 hours. Cardiac Enzymes: No results for input(s): CKTOTAL, CKMB, CKMBINDEX, TROPONINI in the last 168 hours. BNP (last 3 results) No results for input(s): PROBNP in the last 8760 hours. HbA1C: No results for input(s): HGBA1C in the last 72 hours. CBG: Recent Labs  Lab 10/17/19 1948 10/18/19 0034 10/18/19 0419 10/18/19 0710 10/18/19 0723  GLUCAP 147* 79 111* 148* 165*   Lipid Profile: No  results for input(s): CHOL, HDL, LDLCALC, TRIG, CHOLHDL, LDLDIRECT in the last 72 hours. Thyroid Function Tests: Recent Labs    10/16/19 0840 10/17/19 0413  TSH 6.312*  --   FREET4  --  1.08   Anemia Panel: Recent Labs    10/16/19 0840  VITAMINB12 627   Sepsis Labs: No results for input(s): PROCALCITON, LATICACIDVEN in the last 168 hours.  No results found for this or any previous visit (from the past 240 hour(s)).       Radiology Studies: CT HEAD WO CONTRAST  Result Date: 10/17/2019 CLINICAL DATA:  Transient ischemic attack (TIA), dysarthria. EXAM: CT HEAD WITHOUT CONTRAST TECHNIQUE:  Contiguous axial images were obtained from the base of the skull through the vertex without intravenous contrast. COMPARISON:  CT head/cervical spine 10/04/2019, head CT 02/06/2016, brain MRI 09/14/2013 FINDINGS: Brain: Again demonstrated are chronic bilateral posterior MCA territory cortically based infarcts. On the right, chronic infarction changes are present within portions of the right parietal, temporal and occipital lobes. On the left, chronic infarction changes are predominantly present within the left temporoparietal lobes. Known chronic lacunar infarcts within the thalami, pons and left basal cerebellum were better appreciated on brain MRI 09/14/2013. No acute demarcated cortical infarct is identified. No evidence of acute intracranial hemorrhage. No evidence of intracranial mass. No midline shift or extra-axial fluid collection. Stable background generalized parenchymal atrophy and chronic small vessel ischemic disease. Vascular: No hyperdense vessel. Atherosclerotic calcifications. Skull: Normal. Negative for fracture or focal lesion. Sinuses/Orbits: Visualized orbits demonstrate no acute abnormality. Mild mucosal thickening and small air-fluid level within the left maxillary sinus. Partial opacification of right mastoid air cells. IMPRESSION: No evidence of acute intracranial abnormality. Generalized parenchymal atrophy and chronic small vessel ischemic disease with multiple chronic infarcts as described. Mild mucosal thickening and small air-fluid level within the left maxillary sinus. Small right mastoid effusion. Electronically Signed   By: Kellie Simmering DO   On: 10/17/2019 13:06   DG Chest Port 1 View  Result Date: 10/17/2019 CLINICAL DATA:  Dyspnea. EXAM: PORTABLE CHEST 1 VIEW COMPARISON:  10/12/2019 FINDINGS: Patient is rotated towards the left. Persistent densities or consolidation at the left lung base. Heart size is grossly stable but difficult to evaluate the size based on the patient  rotation. Feeding tube extends into the abdomen. Again noted are prominent interstitial lung densities. Atherosclerotic calcifications at the aortic arch. IMPRESSION: 1. Persistent opacification at the left lung base. Findings are suggestive for consolidation or pleural fluid at the left lung base. 2. Prominent interstitial lung densities have minimally changed. Findings are suggestive for mild interstitial edema. 3. Feeding tube extends in the abdomen but the tip is beyond the image. Electronically Signed   By: Markus Daft M.D.   On: 10/17/2019 08:54        Scheduled Meds: . apixaban  5 mg Per Tube BID  . buPROPion  100 mg Per Tube TID  . carvedilol  3.125 mg Oral BID  . chlorhexidine  15 mL Mouth/Throat BID  . Chlorhexidine Gluconate Cloth  6 each Topical Daily  . famotidine  20 mg Per Tube Daily  . free water  200 mL Per Tube Q6H  . furosemide  20 mg Oral Daily  . gabapentin  300 mg Per Tube Q8H  . Gerhardt's butt cream   Topical Daily  . hydrALAZINE  50 mg Oral TID  . influenza vaccine adjuvanted  0.5 mL Intramuscular Tomorrow-1000  . insulin aspart  0-15 Units Subcutaneous Q4H  .  insulin aspart  5 Units Subcutaneous Q4H  . insulin detemir  10 Units Subcutaneous Daily  . ipratropium  2 puff Inhalation Q8H  . levalbuterol  1-2 puff Inhalation Q8H  . levothyroxine  175 mcg Per Tube Daily  . mouth rinse  15 mL Mouth Rinse 10 times per day  . pneumococcal 23 valent vaccine  0.5 mL Intramuscular Tomorrow-1000  . sodium chloride flush  10-40 mL Intracatheter Q12H   Continuous Infusions: . sodium chloride 250 mL (10/05/19 1208)  . feeding supplement (VITAL AF 1.2 CAL) 80 mL/hr at 10/18/19 0200     LOS: 14 days   Time spent= 35 mins    Leobardo Granlund Arsenio Loader, MD [facility]  If 7PM-7AM, please contact night-coverage  10/18/2019, 10:33 AM

## 2019-10-18 NOTE — Progress Notes (Signed)
Modified Barium Swallow Progress Note  Patient Details  Name: Debra Mcconnell MRN: [id number] Date of Birth: 03/13/1946  Today's Date: 10/18/2019  Modified Barium Swallow completed.  Full report located under Chart Review in the Imaging Section.  Brief recommendations include the following:  Clinical Impression  Oropharyngeal dysphagia markedy by dysnchrony of protective mechanisms leading to aspiration. Pt sleepy but arousable with minimal stimulation needed throughout. Oral cohesion and bolus formation mildly effortful, prolonged with decreased cohesion but managed to form bolus for posterior transfer. Her initiation of laryngeal elevation was late and nectar barium reached valleculae and entered laryngeal vestibule before full epiglottic inversion and barium was aspirated during the swallow followed by strong reflexive cough. Honey thick remained cohesive and was not penetrated nor aspirated. Pharyngeal residue was not significant. It is recommended she continue puree (Dys 1 texture), and can initiate honey thick liquids, crush pills, sit upright, full assist with feeding and cues for swallowing. ST will follow; adequate intake may be a challenge given lethargy and altered cognition.         Swallow Evaluation Recommendations       SLP Diet Recommendations: Dysphagia 1 (Puree) solids;Honey thick liquids   Liquid Administration via: Cup   Medication Administration: Crushed with puree   Supervision: Full assist for feeding;Staff to assist with self feeding   Compensations: Slow rate;Small sips/bites;Lingual sweep for clearance of pocketing   Postural Changes: Seated upright at 90 degrees   Oral Care Recommendations: Oral care BID   Other Recommendations: Order thickener from pharmacy    Houston Siren 10/18/2019,2:09 PM   Orbie Pyo Colvin Caroli.Ed Risk analyst [phone] Office [phone]

## 2019-10-18 NOTE — Plan of Care (Signed)

## 2019-10-18 NOTE — Progress Notes (Signed)
Occupational Therapy Treatment Patient Details Name: Debra Mcconnell MRN: [id number] DOB: 1945/12/28 Today's Date: 10/18/2019    History of present illness Pt is a 74 y.o. female admitted 10/04/19 for acute respiratory distress secondary to COVID-19 viral PNA; ETT 1/26-2/3. Also worked up for myocarditis, AKI, combined septic and cardiogenic shock, aflutter, splenic infarct. PMH includes stroke, HTN, DM2, neuropathy, aflutter, arthritis, bilateral TKA, bilateral hammer toe.   OT comments  Pt lethargic however following commands more consistently this session. Pt assisting more with rolling to place lift pad. Once in chair, pt assisting better with simple grooming tasks.Performance limited by level of arousal at this time - discussed with Dr Ranell Patrick. Will try to obtain hearing aids and glasses to assist with rehab. Continue to recommend post acute care.   Follow Up Recommendations  SNF;Supervision/Assistance - 24 hour vs CIR pending improvement   Equipment Recommendations  Other (comment)(TBI)    Recommendations for Other Services      Precautions / Restrictions Precautions Precautions: Fall;Other (comment) Precaution Comments: cortrak, sacral wound       Mobility Bed Mobility Overal bed mobility: Needs Assistance Bed Mobility: Rolling Rolling: Max assist;+2 for physical assistance         General bed mobility comments: increased ability to assist with with today. Easier for pt to roll toward L side. Improved initation with rolling. Once knee flexed, pt able to assist with pushing through LE; complains of pain with leg movement  Transfers Overall transfer level: Needs assistance                    Balance Overall balance assessment: Needs assistance   Sitting balance-Leahy Scale: Poor Sitting balance - Comments: L bias                                   ADL either performed or assessed with clinical judgement   ADL Overall ADL's : Needs  assistance/impaired     Grooming: Moderate assistance Grooming Details (indicate cue type and reason): able to wash face with increased control today; ablet o sustain attention to brushing teeth for a few seconds                             Functional mobility during ADLs: +2 for physical assistance;Total assistance       Vision   Vision Assessment?: Vision impaired- to be further tested in functional context Additional Comments: possible L field deficit   Perception     Praxis      Cognition Arousal/Alertness: Lethargic Behavior During Therapy: Flat affect Overall Cognitive Status: Impaired/Different from baseline Area of Impairment: Orientation;Attention;Memory;Following commands;Safety/judgement;Awareness;Problem solving                 Orientation Level: Disoriented to;Place;Time;Situation Current Attention Level: Focused Memory: Decreased recall of precautions;Decreased short-term memory Following Commands: Follows one step commands inconsistently Safety/Judgement: Decreased awareness of safety;Decreased awareness of deficits Awareness: Intellectual Problem Solving: Slow processing;Decreased initiation;Difficulty sequencing;Requires verbal cues;Requires tactile cues          Exercises     Shoulder Instructions       General Comments Lethargic however following commands with improved consistency today    Pertinent Vitals/ Pain       Pain Assessment: Faces Pain Score: 0-No pain Faces Pain Scale: Hurts little more Pain Location: BLEs with rolling and washup Pain Descriptors / Indicators: Grimacing;Moaning  Pain Intervention(s): Limited activity within patient's tolerance;Repositioned  Home Living                                          Prior Functioning/Environment              Frequency  Min 3X/week        Progress Toward Goals  OT Goals(current goals can now be found in the care plan section)  Progress  towards OT goals: Progressing toward goals  Acute Rehab OT Goals Patient Stated Goal: to get better OT Goal Formulation: Patient unable to participate in goal setting Time For Goal Achievement: 10/28/19 Potential to Achieve Goals: Good ADL Goals Pt Will Perform Eating: with min assist;sitting Pt Will Perform Grooming: with min assist;sitting Pt Will Perform Upper Body Bathing: with min assist;sitting Pt Will Perform Lower Body Bathing: with mod assist;bed level Pt Will Transfer to Toilet: with +2 assist;with max assist;bedside commode Pt Will Perform Toileting - Clothing Manipulation and hygiene: with max assist;sitting/lateral leans Additional ADL Goal #1: Pt will demonstrate selective attention during ADL task with min redirectional cuing  Plan Discharge plan remains appropriate;Frequency needs to be updated    Co-evaluation                 AM-PAC OT "6 Clicks" Daily Activity     Outcome Measure   Help from another person eating meals?: A Lot Help from another person taking care of personal grooming?: A Lot Help from another person toileting, which includes using toliet, bedpan, or urinal?: Total Help from another person bathing (including washing, rinsing, drying)?: A Lot Help from another person to put on and taking off regular upper body clothing?: Total Help from another person to put on and taking off regular lower body clothing?: Total 6 Click Score: 9    End of Session    OT Visit Diagnosis: Muscle weakness (generalized) (M62.81)   Activity Tolerance Patient tolerated treatment well   Patient Left in chair;with call bell/phone within reach;with chair alarm set   Nurse Communication Mobility status;Other (comment)(pain management; BP)        Time: 0940-1030 OT Time Calculation (min): 50 min  Charges: OT General Charges $OT Visit: 1 Visit OT Treatments $Self Care/Home Management : 8-22 mins $Therapeutic Activity: 23-37 mins  Maurie Boettcher, OT/L    Acute OT Clinical Specialist Acute Rehabilitation Services Pager [phone] Office [phone]    [facility] 10/18/2019, 1:53 PM

## 2019-10-18 NOTE — Progress Notes (Signed)
Neighbor dropped off a pair of glasses and a pair of hearing aid for patient this afternoon. One of the hearing aid appeared to be broken. Everything is at patient bedside in their proper case inside a plastic bag.

## 2019-10-18 NOTE — Consult Note (Signed)
Physical Medicine and Rehabilitation Consult Reason for Consult: Confusion, impaired mobility and ADLs Referring Physician: Dr. Damita Lack, MD Referring Therapist: Maurie Boettcher, OT   HPI: Debra Mcconnell is a 74 y.o. female with a history of DM2, CVA, HTN, HLD, atrial flutter, hypothyroidism, OSA, who presented to [facility] from [facility] with a diagnosis of COVID-19/hypoxia, AKI, and hyperglycemia. CTA chest was negative for PE and CT head was negative. She was intubated for hypoxic respiratory failure on 1/26 and extubated on 2/3. Echocardiogram showed a new diagnosis of CHF with an EF of 25-30% with global hypokinesia concerning for myocarditis. She received steroids and completed remdesivir and is now transferred out of the ICU.   Plan for MBS today to evaluate oropharyngeal swallow and ability to begin liquids. Has Cortrak and dysphagia 1 diet with pudding thick liquid. Plan for DC to SNF.   ROS Asks for coffee and to take a nap. ROS otherwise limited due to cognitive deficit.   Past Medical History:  Diagnosis Date  . Acquired hammer toes of both feet 01/22/2017  . Arthritis 11/29/2013  . Atrial flutter ([place]) 01/24/2014   Overview:  CHADS2 vasc score= 5  . B-complex deficiency 11/29/2013  . Cerebral artery occlusion with cerebral infarction ([place]) 12/04/2013   Overview:  STORY: MRI +ve Left frontoparietal infarction (LMCA). +AFib.`E1o3L`IMPRESSION: BP is under controlled. Continue home health. +AFib, con't Eliquis 5mg  bid. Will obtain old record from [facility] for review.  . Diabetic polyneuropathy associated with type 2 diabetes mellitus ([place]) 01/22/2017  . Essential hypertension 11/13/2015  . Hyperlipemia, retention 11/13/2015  . OSA (obstructive sleep apnea) 01/24/2014   Overview:  IMPRESSION: Will obtain old record for review and ask DME to d/l compliance and fax the result to me. F/u in 6 wks.  . Pre-ulcerative corn or callous 01/22/2017  . Stroke ([place]) 01/24/2014  .  Thyroid disease 11/29/2013  . 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 CHOLECYSTECTOMY    . GASTROCYSTOPLASTY    . KNEE SURGERY     Family History  Problem Relation Age of Onset  . Heart disease Mother   . Heart disease Father   . Cancer Father    Social History:  reports that she has never smoked. She has never used smokeless tobacco. She reports that she does not drink alcohol or use drugs. Allergies:  Allergies  Allergen Reactions  . Gabapentin Other (See Comments)    Dizziness and drowsiness  . Lisinopril Cough   Medications Prior to Admission  Medication Sig Dispense Refill  . amLODipine (NORVASC) 10 MG tablet Take 10 mg by mouth daily.    Marland Kitchen apixaban (ELIQUIS) 5 MG TABS tablet Take 1 tablet (5 mg total) by mouth 2 (two) times daily. 180 tablet 3  . atorvastatin (LIPITOR) 40 MG tablet Take 40 mg by mouth daily.    Marland Kitchen buPROPion (WELLBUTRIN XL) 300 MG 24 hr tablet Take 300 mg by mouth daily.    . carvedilol (COREG) 25 MG tablet Take 25 mg by mouth 2 (two) times daily.    . Cholecalciferol (VITAMIN D) 2000 units CAPS Take 4,000 Units by mouth 2 (two) times daily.    . ciprofloxacin (CIPRO) 500 MG tablet Take 1 tablet (500 mg total) by mouth 2 (two) times daily. 20 tablet 0  . clindamycin (CLEOCIN) 300 MG capsule Take 1 capsule (300 mg total) by mouth 3 (three) times  daily. 30 capsule 0  . Cyanocobalamin (VITAMIN B 12 PO) Take 1,000 mcg by mouth daily.    . Exenatide ER 2 MG SRER Inject 2 mg into the skin once a week.     . ferrous sulfate 325 (65 FE) MG EC tablet Take 325 mg by mouth 2 (two) times daily with a meal.    . furosemide (LASIX) 20 MG tablet Take 20 mg by mouth daily.    Marland Kitchen gabapentin (NEURONTIN) 300 MG capsule Take 300 mg by mouth 3 (three) times daily.  0  . glimepiride (AMARYL) 1 MG tablet Take 1-2 mg by mouth See admin instructions. Take 2 mg by mouth in the morning and 1 mg in  the evening    . hydrALAZINE (APRESOLINE) 50 MG tablet Take 50 mg by mouth 3 (three) times daily.    Marland Kitchen levothyroxine (SYNTHROID, LEVOTHROID) 175 MCG tablet Take 175 mcg by mouth daily.    Marland Kitchen losartan (COZAAR) 100 MG tablet Take 100 mg by mouth daily.  0  . olmesartan (BENICAR) 40 MG tablet Take 40 mg by mouth daily.     Marland Kitchen omeprazole (PRILOSEC) 20 MG capsule Take 20 mg by mouth daily.     . potassium chloride (KLOR-CON) 10 MEQ tablet Take 15 mEq by mouth daily.    Marland Kitchen zolpidem (AMBIEN) 5 MG tablet Take 5 mg by mouth at bedtime as needed for sleep.      Home: Home Living Family/patient expects to be discharged to:: Private residence Living Arrangements: Alone Available Help at Discharge: Neighbor([name], neighbor, is HCPOA) Type of Home: House Home Access: Stairs to enter [employer] of Steps: 6 Entrance Stairs-Rails: Right, Left Home Layout: One level Bathroom Shower/Tub: Multimedia programmer: Programmer, systems: Yes Home Equipment: [place] in, [place] - single point, Environmental consultant - 2 wheels, Bedside commode, Grab bars - tub/shower Additional Comments: patsy lives 3 mi away  Functional History: Prior Function Level of Independence: Needs assistance Gait / Transfers Assistance Needed: uses [employer] to walk house and community.  Holds [name]'s hand until she gets into the store and pushes the cart. Does well with the cart, h/o falls at home.   ADL's / Homemaking Assistance Needed: cooks, [name] helps her clean, she can do light cleaning if necessary.   Communication / Swallowing Assistance Needed: HOH Comments: Pt wears glasses all of the time, likes to watch TV [employer] programs, movies) Functional Status:  Mobility: Bed Mobility Overal bed mobility: Needs Assistance Bed Mobility: Rolling Rolling: Max assist, +2 for physical assistance Supine to sit: Max assist, +2 for physical assistance, HOB elevated Sit to supine: Max assist, +2 for physical  assistance, HOB elevated General bed mobility comments: Performed multiple bouts of rolling R/L for washup, linen change and pad placement; pt with improved initiation rolling towards L-side, reaching for rail; minimal (if any) initiation rolling towards R-side, maxA+2 Transfers Overall transfer level: Needs assistance Transfer via Lift Equipment: [place]ove General transfer comment: Transferred from bed to recliner with use of maxinive lift; pt tolerated great. Post-transfer BP 113/69      ADL: ADL Overall ADL's : Needs assistance/impaired Eating/Feeding: Maximal assistance Eating/Feeding Details (indicate cue type and reason): hand over hand with eating applesauce; VC to maintain attneiton to task; Difficulty mantining grasp on spoon Grooming: Moderate assistance, Wash/dry face, Oral care, Maximal assistance(Max for oral care) Upper Body Bathing: Moderate assistance Lower Body Bathing: Total assistance Upper Body Dressing : Moderate assistance Lower Body Dressing: Total assistance Toilet Transfer: Total  assistance Toileting- Clothing Manipulation and Hygiene: Total assistance Functional mobility during ADLs: +2 for physical assistance, Total assistance(used lift)  Cognition: Cognition Overall Cognitive Status: Impaired/Different from baseline Orientation Level: Oriented to person Cognition Arousal/Alertness: Lethargic Behavior During Therapy: Flat affect Overall Cognitive Status: Impaired/Different from baseline Area of Impairment: Orientation, Attention, Memory, Following commands, Safety/judgement, Awareness, Problem solving Orientation Level: Disoriented to, Place, Situation Current Attention Level: Focused(moments of sustained attention when feedingwashing face) Memory: Decreased short-term memory Following Commands: Follows one step commands inconsistently, Follows one step commands with increased time Safety/Judgement: Decreased awareness of safety, Decreased awareness of  deficits Awareness: Intellectual Problem Solving: Slow processing, Decreased initiation, Difficulty sequencing, Requires verbal cues, Requires tactile cues General Comments: Limited by very poor attention and decreased initiation; difficulty following commands. Perseverating on wanting coffee or water. Repetitive with short responses. "I need to see my dog"  Blood pressure 137/73, pulse 66, temperature 97.8 F (36.6 C), temperature source Oral, resp. rate (!) 23, height 5\' 8"  (1.727 m), weight 100.3 kg, SpO2 91 %. Physical Exam   General: No apparent distress, working with OT on bed mobility, moved to chair via [employer].  HEENT: Head is normocephalic, atraumatic, PERRLA, EOMI, sclera anicteric, oral mucosa pink and moist, dentition intact, ext ear canals clear, hard of hearing.  Neck: Supple without JVD or lymphadenopathy Heart: Reg rate and rhythm. No murmurs rubs or gallops Chest: CTA bilaterally without wheezes, rales, or rhonchi; no distress Abdomen: Soft, non-tender, non-distended, bowel sounds positive. Extremities: No clubbing, cyanosis, or edema. Pulses are 2+ Skin: Clean and intact without signs of breakdown Neuro: Alert and oriented x 1, Asks for coffee, water, and a nap. Says she has known [name] for 40 years and is her neighbor. Does not attempt to answer additional orientation questions. Follows simple commands such as squeezing hands. Says "ow" and that it hurts when she moves around, but can not identify source of pain. Left field neglect.  Psych: Pt's affect lethargic but arousable.   Results for orders placed or performed during the hospital encounter of 10/04/19 (from the past 24 hour(s))  Glucose, capillary     Status: Abnormal   Collection Time: 10/17/19 11:35 AM  Result Value Ref Range   Glucose-Capillary 177 (H) 70 - 99 mg/dL  Glucose, capillary     Status: Abnormal   Collection Time: 10/17/19  3:39 PM  Result Value Ref Range   Glucose-Capillary 179 (H) 70 - 99  mg/dL  Glucose, capillary     Status: Abnormal   Collection Time: 10/17/19  7:48 PM  Result Value Ref Range   Glucose-Capillary 147 (H) 70 - 99 mg/dL  Glucose, capillary     Status: None   Collection Time: 10/18/19 12:34 AM  Result Value Ref Range   Glucose-Capillary 79 70 - 99 mg/dL  CBC     Status: Abnormal   Collection Time: 10/18/19  3:47 AM  Result Value Ref Range   WBC 8.9 4.0 - 10.5 K/uL   RBC 3.86 (L) 3.87 - 5.11 MIL/uL   Hemoglobin 12.2 12.0 - 15.0 g/dL   HCT 38.9 36.0 - 46.0 %   MCV 100.8 (H) 80.0 - 100.0 fL   MCH 31.6 26.0 - 34.0 pg   MCHC 31.4 30.0 - 36.0 g/dL   RDW 16.5 (H) 11.5 - 15.5 %   Platelets 142 (L) 150 - 400 K/uL   nRBC 0.0 0.0 - 0.2 %  Basic metabolic panel     Status: Abnormal   Collection Time: 10/18/19  3:47 AM  Result Value Ref Range   Sodium 139 135 - 145 mmol/L   Potassium 4.3 3.5 - 5.1 mmol/L   Chloride 103 98 - 111 mmol/L   CO2 28 22 - 32 mmol/L   Glucose, Bld 120 (H) 70 - 99 mg/dL   BUN 57 (H) 8 - 23 mg/dL   Creatinine, Ser 0.96 0.44 - 1.00 mg/dL   Calcium 8.1 (L) 8.9 - 10.3 mg/dL   GFR calc non Af Amer 60 (L) >60 mL/min   GFR calc Af Amer >60 >60 mL/min   Anion gap 8 5 - 15  Magnesium     Status: None   Collection Time: 10/18/19  3:47 AM  Result Value Ref Range   Magnesium 2.1 1.7 - 2.4 mg/dL  Brain natriuretic peptide     Status: Abnormal   Collection Time: 10/18/19  3:47 AM  Result Value Ref Range   B Natriuretic Peptide 333.2 (H) 0.0 - 100.0 pg/mL  Glucose, capillary     Status: Abnormal   Collection Time: 10/18/19  7:10 AM  Result Value Ref Range   Glucose-Capillary 148 (H) 70 - 99 mg/dL  Glucose, capillary     Status: Abnormal   Collection Time: 10/18/19  7:23 AM  Result Value Ref Range   Glucose-Capillary 165 (H) 70 - 99 mg/dL   CT HEAD WO CONTRAST  Result Date: 10/17/2019 CLINICAL DATA:  Transient ischemic attack (TIA), dysarthria. EXAM: CT HEAD WITHOUT CONTRAST TECHNIQUE: Contiguous axial images were obtained from the base  of the skull through the vertex without intravenous contrast. COMPARISON:  CT head/cervical spine 10/04/2019, head CT 02/06/2016, brain MRI 09/14/2013 FINDINGS: Brain: Again demonstrated are chronic bilateral posterior MCA territory cortically based infarcts. On the right, chronic infarction changes are present within portions of the right parietal, temporal and occipital lobes. On the left, chronic infarction changes are predominantly present within the left temporoparietal lobes. Known chronic lacunar infarcts within the thalami, pons and left basal cerebellum were better appreciated on brain MRI 09/14/2013. No acute demarcated cortical infarct is identified. No evidence of acute intracranial hemorrhage. No evidence of intracranial mass. No midline shift or extra-axial fluid collection. Stable background generalized parenchymal atrophy and chronic small vessel ischemic disease. Vascular: No hyperdense vessel. Atherosclerotic calcifications. Skull: Normal. Negative for fracture or focal lesion. Sinuses/Orbits: Visualized orbits demonstrate no acute abnormality. Mild mucosal thickening and small air-fluid level within the left maxillary sinus. Partial opacification of right mastoid air cells. IMPRESSION: No evidence of acute intracranial abnormality. Generalized parenchymal atrophy and chronic small vessel ischemic disease with multiple chronic infarcts as described. Mild mucosal thickening and small air-fluid level within the left maxillary sinus. Small right mastoid effusion. Electronically Signed   By: Kellie Simmering DO   On: 10/17/2019 13:06   DG Chest Port 1 View  Result Date: 10/17/2019 CLINICAL DATA:  Dyspnea. EXAM: PORTABLE CHEST 1 VIEW COMPARISON:  10/12/2019 FINDINGS: Patient is rotated towards the left. Persistent densities or consolidation at the left lung base. Heart size is grossly stable but difficult to evaluate the size based on the patient rotation. Feeding tube extends into the abdomen. Again  noted are prominent interstitial lung densities. Atherosclerotic calcifications at the aortic arch. IMPRESSION: 1. Persistent opacification at the left lung base. Findings are suggestive for consolidation or pleural fluid at the left lung base. 2. Prominent interstitial lung densities have minimally changed. Findings are suggestive for mild interstitial edema. 3. Feeding tube extends in the abdomen but the tip is beyond the  image. Electronically Signed   By: Markus Daft M.D.   On: 10/17/2019 08:54   Assessment and Plan:   I have personally reviewed patient's labs which are stable, with the exception of elevated BNP (receiving IV Lasix). No leukocytosis or electrolyte abnormalities that would contribute to delirium. I have personally reviewed her CT Head which shows multiple chronic infarcts which could explain chronic cognitive deficits.  I spoke at length this morning with patient's neighbor, [name], to establish patient's baseline cognition and home support. She was independent in ADLs and caring for herself at home, though she did not drive. [name] has known her for 50 years and is her dearest friend and caregiver. She has no family nearby. [name] can help to obtain caregiver support upon her return to home.  [name] said she is very hard of hearing and requires glasses. Would benefit from having her hearing aids and glasses brought in. I will ask [name] to send these for her.   I would recommend a UA/UC to r/o UTI.   CXR personally reviewed and shows LLL consolidation, present on prior as well. Does have cough, which is worsening as per therapist. Can consider sputum culture.   Consider weaning off Gabapentin to 300mg  BID for 2 days, then 300mg  HS for 2 days, 100mg  HS 2 days, and then stop, as this could be sedating her. Can use Tylenol for pain instead.She cannot describe where she feels the pain.   Pressure ulcer on buttock: Daily dressings, application of Gerhardt's butt cream.  Thank you for this  consult. I will continue to follow in Ms. Septer's care.   Izora Ribas, MD 10/18/2019

## 2019-10-18 NOTE — Plan of Care (Addendum)
Patient is AO x 1 (Self). Neuro Assessment: Pupils are 49mm bilaterally, brisk and reactive to light. EOMI intact. Cough present and strong. Sensation intact. No signs of focal weakness but generalized weakness present. Speech is clear. 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  Cardiac Assessment: Heart sounds normal. Pulses are 2+ in UE and 2+ in LE. MAP/BP goal maintained. Heart rhythm is Afib with Occasional PVC . No complain of chest pain or dizziness during shift. Respiratory Assessment: Lung sounds are diminished but clear. Patient is currently on RA. Patient maintain O2 saturation during shift between 90 and 97%. No complaint of SOB during shift except with repositioning. Gasto/Urinary Assessment: Patient is voiding in [place]. Active bowel sounds. 0 BM overnight. Skin Assessment: Nothing new to note. Skin protocols maintained overnight. Nursing Recommendation: Awaiting transfer to COVID-19 SNF. Will continue to monitor and will update note with changes.     Problem: Education: Goal: Knowledge of risk factors and measures for prevention of condition will improve Outcome: Progressing   Problem: Coping: Goal: Psychosocial and spiritual needs will be supported Outcome: Progressing   Problem: Respiratory: Goal: Will maintain a patent airway Outcome: Progressing Goal: Complications related to the disease process, condition or treatment will be avoided or minimized Outcome: Progressing   Problem: Education: Goal: Knowledge of General Education information will improve Description: Including pain rating scale, medication(s)/side effects and non-pharmacologic comfort measures Outcome: Progressing   Problem: Health Behavior/Discharge Planning: Goal: Ability to manage health-related needs will improve Outcome: Progressing   Problem: Clinical Measurements: Goal: Ability to maintain clinical measurements within normal limits will improve Outcome: Progressing Goal: Will remain free from  infection Outcome: Progressing Goal: Diagnostic test results will improve Outcome: Progressing Goal: Respiratory complications will improve Outcome: Progressing Goal: Cardiovascular complication will be avoided Outcome: Progressing   Problem: Activity: Goal: Risk for activity intolerance will decrease Outcome: Progressing   Problem: Nutrition: Goal: Adequate nutrition will be maintained Outcome: Progressing   Problem: Coping: Goal: Level of anxiety will decrease Outcome: Progressing   Problem: Elimination: Goal: Will not experience complications related to bowel motility Outcome: Progressing Goal: Will not experience complications related to urinary retention Outcome: Progressing   Problem: Pain Managment: Goal: General experience of comfort will improve Outcome: Progressing   Problem: Safety: Goal: Ability to remain free from injury will improve Outcome: Progressing   Problem: Skin Integrity: Goal: Risk for impaired skin integrity will decrease Outcome: Progressing

## 2019-10-18 NOTE — Progress Notes (Signed)
Physical Therapy Treatment Patient Details Name: Debra Mcconnell MRN: [id number] DOB: 25-Mar-1946 Today's Date: 10/18/2019    History of Present Illness Pt is a 74 y.o. female admitted 10/04/19 for acute respiratory distress secondary to COVID-19 viral PNA; ETT 1/26-2/3. Also worked up for myocarditis, AKI, combined septic and cardiogenic shock, aflutter, splenic infarct. PMH includes stroke, HTN, DM2, neuropathy, aflutter, arthritis, bilateral TKA, bilateral hammer toe.    PT Comments    Pt was able to sit EOB and talk with her friend, [name] on FaceTime.  The FaceTime call is the most alert and sustained I have seen her attention yet.  She was able to sit with close supervision for ~8 mins EOB.  We attempted standing with two person assist using the chuck pad and she was able to WB through her legs and get to 3/4th standing.  We should try the steady standing frame next session and try to incorporate [name] into the session.  Of note, her hearing aids and glasses are now in the room.  PT will continue to follow acutely for safe mobility progression   Follow Up Recommendations  SNF;Supervision/Assistance - 24 hour     Equipment Recommendations  Wheelchair (measurements PT);Wheelchair cushion (measurements PT);Hospital bed;3in1 (PT)    Recommendations for Other Services   NA     Precautions / Restrictions Precautions Precautions: Fall;Other (comment) Precaution Comments: cortrak, sacral wound    Mobility  Bed Mobility Overal bed mobility: Needs Assistance Bed Mobility: Rolling;Supine to Sit;Sit to Supine Rolling: +2 for physical assistance;Max assist   Supine to sit: +2 for physical assistance;Max assist;HOB elevated Sit to supine: +2 for physical assistance;Max assist;HOB elevated   General bed mobility comments: Two person max assist to roll to get on bed pan, two person max assist to come to sitting EOB with HOB maximally raised.  Attempted hand over hand assist to reach for bed  rail, however, pt cannot maintain both due to poor strength and poor sustained attention. Support at trunk and legs, with bed pad to assist in scooting to EOB for supine <-> sit transition.    Transfers Overall transfer level: Needs assistance Equipment used: 2 person hand held assist Transfers: Sit to/from Stand Sit to Stand: +2 physical assistance;Max assist;From elevated surface         General transfer comment: Two person max assist using bed pad for pt to stand EOB, bil knees blocked and hyperextended against the foot board of the bed.  Pt may do well to try the steady standing frame next session.    Ambulation/Gait             General Gait Details: unable at this time.           Balance Overall balance assessment: Needs assistance Sitting-balance support: Feet supported;No upper extremity supported;Bilateral upper extremity supported Sitting balance-Leahy Scale: Fair Sitting balance - Comments: close supervision EOB once feet supported on the floor and hips squared up EOB.    Standing balance support: Bilateral upper extremity supported Standing balance-Leahy Scale: Zero Standing balance comment: two person max to get to nearly full stand.                             Cognition Arousal/Alertness: Lethargic Behavior During Therapy: Flat affect Overall Cognitive Status: Impaired/Different from baseline Area of Impairment: Orientation;Attention;Memory;Following commands;Safety/judgement;Awareness;Problem solving                 Orientation Level: Disoriented to;Place;Time;Situation  Current Attention Level: Focused(did sustain longer when talking to her friend, patsy on FT) Memory: Decreased recall of precautions;Decreased short-term memory Following Commands: Follows one step commands inconsistently;Follows one step commands with increased time Safety/Judgement: Decreased awareness of safety;Decreased awareness of deficits Awareness:  Intellectual Problem Solving: Slow processing;Decreased initiation;Difficulty sequencing;Requires verbal cues;Requires tactile cues General Comments: Pt keeping her eyes closed most of the session, did keep them open for her friend [name] who we FaceTimed while sitting EOB.  Pt reponds better to "Juliann Pulse" than "Ms. Novelo".  She now has her hearing aids and glasses, so hopefully these will help as she is very HOH.          General Comments General comments (skin integrity, edema, etc.): VSS on RA throughout session as measured by pedi probe on earlobe.       Pertinent Vitals/Pain Pain Assessment: Faces Pain Score: 0-No pain Faces Pain Scale: Hurts little more Pain Location: generalized UE/LE with mobility Pain Descriptors / Indicators: Grimacing;Moaning Pain Intervention(s): Monitored during session;Limited activity within patient's tolerance;Repositioned           PT Goals (current goals can now be found in the care plan section) Acute Rehab PT Goals Patient Stated Goal: to get better Progress towards PT goals: Progressing toward goals    Frequency    Min 2X/week      PT Plan Current plan remains appropriate       AM-PAC PT "6 Clicks" Mobility   Outcome Measure  Help needed turning from your back to your side while in a flat bed without using bedrails?: Total Help needed moving from lying on your back to sitting on the side of a flat bed without using bedrails?: Total Help needed moving to and from a bed to a chair (including a wheelchair)?: Total Help needed standing up from a chair using your arms (e.g., wheelchair or bedside chair)?: Total Help needed to walk in hospital room?: Total Help needed climbing 3-5 steps with a railing? : Total 6 Click Score: 6    End of Session   Activity Tolerance: Patient tolerated treatment well Patient left: in bed;with call bell/phone within reach;with bed alarm set Nurse Communication: Mobility status;Other (comment)(pt on bedpan,  please check on her) PT Visit Diagnosis: Muscle weakness (generalized) (M62.81);Difficulty in walking, not elsewhere classified (R26.2)     Time: [id number] PT Time Calculation (min) (ACUTE ONLY): 28 min  Charges:  $Therapeutic Activity: 23-37 mins     Verdene Lennert, PT, DPT  Acute Rehabilitation [phone] pager #[phone] office  @ [facility]: [phone]         10/18/2019, 5:11 PM

## 2019-10-19 DIAGNOSIS — I6350 Cerebral infarction due to unspecified occlusion or stenosis of unspecified cerebral artery: Secondary | ICD-10-CM

## 2019-10-19 DIAGNOSIS — E1139 Type 2 diabetes mellitus with other diabetic ophthalmic complication: Secondary | ICD-10-CM

## 2019-10-19 DIAGNOSIS — Z794 Long term (current) use of insulin: Secondary | ICD-10-CM

## 2019-10-19 DIAGNOSIS — I10 Essential (primary) hypertension: Secondary | ICD-10-CM

## 2019-10-19 DIAGNOSIS — U071 COVID-19: Secondary | ICD-10-CM

## 2019-10-19 DIAGNOSIS — L89301 Pressure ulcer of unspecified buttock, stage 1: Secondary | ICD-10-CM

## 2019-10-19 DIAGNOSIS — J1282 Pneumonia due to coronavirus disease 2019: Secondary | ICD-10-CM

## 2019-10-19 DIAGNOSIS — I4892 Unspecified atrial flutter: Secondary | ICD-10-CM

## 2019-10-19 HISTORY — DX: COVID-19: U07.1

## 2019-10-19 HISTORY — DX: Pneumonia due to coronavirus disease 2019: J12.82

## 2019-10-19 LAB — BASIC METABOLIC PANEL
Anion gap: 11 (ref 5–15)
BUN: 58 mg/dL — ABNORMAL HIGH (ref 8–23)
CO2: 26 mmol/L (ref 22–32)
Calcium: 8.2 mg/dL — ABNORMAL LOW (ref 8.9–10.3)
Chloride: 102 mmol/L (ref 98–111)
Creatinine, Ser: 0.97 mg/dL (ref 0.44–1.00)
GFR calc Af Amer: >60 mL/min (ref >60)
GFR calc non Af Amer: 59 mL/min — ABNORMAL LOW (ref >60)
Glucose, Bld: 118 mg/dL — ABNORMAL HIGH (ref 70–99)
Potassium: 3.9 mmol/L (ref 3.5–5.1)
Sodium: 139 mmol/L (ref 135–145)

## 2019-10-19 LAB — GLUCOSE, CAPILLARY
Glucose-Capillary: 128 mg/dL — ABNORMAL HIGH (ref 70–99)
Glucose-Capillary: 145 mg/dL — ABNORMAL HIGH (ref 70–99)
Glucose-Capillary: 184 mg/dL — ABNORMAL HIGH (ref 70–99)
Glucose-Capillary: 187 mg/dL — ABNORMAL HIGH (ref 70–99)
Glucose-Capillary: 46 mg/dL — ABNORMAL LOW (ref 70–99)
Glucose-Capillary: 92 mg/dL (ref 70–99)
Glucose-Capillary: 93 mg/dL (ref 70–99)

## 2019-10-19 LAB — CBC
HCT: 37.9 % (ref 36.0–46.0)
Hemoglobin: 11.8 g/dL — ABNORMAL LOW (ref 12.0–15.0)
MCH: 31.8 pg (ref 26.0–34.0)
MCHC: 31.1 g/dL (ref 30.0–36.0)
MCV: 102.2 fL — ABNORMAL HIGH (ref 80.0–100.0)
Platelets: 142 10*3/uL — ABNORMAL LOW (ref 150–400)
RBC: 3.71 MIL/uL — ABNORMAL LOW (ref 3.87–5.11)
RDW: 16.5 % — ABNORMAL HIGH (ref 11.5–15.5)
WBC: 6.6 10*3/uL (ref 4.0–10.5)
nRBC: 0 % (ref 0.0–0.2)

## 2019-10-19 MED ORDER — DEXTROSE 50 % IV SOLN
1.0000 | Freq: Once | INTRAVENOUS | Status: AC
  Administered 2019-10-19: 20:00:00 50 mL via INTRAVENOUS

## 2019-10-19 MED ORDER — LOPERAMIDE HCL 1 MG/7.5ML PO SUSP: 1.0000 mg | Freq: Four times a day (QID) | ORAL | Status: DC | PRN

## 2019-10-19 MED ORDER — GABAPENTIN 250 MG/5ML PO SOLN
200.0000 mg | Freq: Two times a day (BID) | ORAL | Status: DC
  Administered 2019-10-19 – 2019-10-20 (×2): 200 mg via ORAL
  Filled 2019-10-19 (×3): qty 4

## 2019-10-19 MED ORDER — APIXABAN 5 MG PO TABS
5.0000 mg | ORAL_TABLET | Freq: Two times a day (BID) | ORAL | Status: DC
  Administered 2019-10-19 – 2019-10-20 (×2): 5 mg via ORAL
  Filled 2019-10-19 (×2): qty 1

## 2019-10-19 MED ORDER — LEVOTHYROXINE SODIUM 75 MCG PO TABS
175.0000 ug | ORAL_TABLET | Freq: Every day | ORAL | Status: DC
  Administered 2019-10-20: 175 ug via ORAL
  Filled 2019-10-19: qty 1

## 2019-10-19 MED ORDER — INSULIN ASPART 100 UNIT/ML ~~LOC~~ SOLN
5.0000 [IU] | Freq: Three times a day (TID) | SUBCUTANEOUS | Status: DC
  Administered 2019-10-20: 5 [IU] via SUBCUTANEOUS

## 2019-10-19 MED ORDER — BUPROPION HCL 100 MG PO TABS
100.0000 mg | ORAL_TABLET | Freq: Three times a day (TID) | ORAL | Status: DC
  Administered 2019-10-19 – 2019-10-20 (×3): 100 mg via ORAL
  Filled 2019-10-19 (×6): qty 1

## 2019-10-19 MED ORDER — INSULIN ASPART 100 UNIT/ML ~~LOC~~ SOLN
0.0000 [IU] | Freq: Three times a day (TID) | SUBCUTANEOUS | Status: DC
  Administered 2019-10-20: 2 [IU] via SUBCUTANEOUS

## 2019-10-19 MED ORDER — ENSURE ENLIVE PO LIQD
237.0000 mL | Freq: Three times a day (TID) | ORAL | Status: DC
  Administered 2019-10-19 – 2019-10-20 (×3): 237 mL via ORAL

## 2019-10-19 MED ORDER — FAMOTIDINE 40 MG/5ML PO SUSR
20.0000 mg | Freq: Every day | ORAL | Status: DC
  Administered 2019-10-20: 20 mg via ORAL
  Filled 2019-10-19: qty 2.5

## 2019-10-19 MED ORDER — LOPERAMIDE HCL 1 MG/7.5ML PO SUSP
4.0000 mg | Freq: Four times a day (QID) | ORAL | Status: DC | PRN
  Administered 2019-10-19 – 2019-10-20 (×2): 4 mg via ORAL
  Filled 2019-10-19 (×3): qty 30

## 2019-10-19 MED ORDER — ALUM & MAG HYDROXIDE-SIMETH 200-200-20 MG/5ML PO SUSP
15.0000 mL | Freq: Four times a day (QID) | ORAL | Status: DC | PRN
  Administered 2019-10-19: 15 mL via ORAL
  Filled 2019-10-19: qty 30

## 2019-10-19 MED ORDER — DEXTROSE 50 % IV SOLN
INTRAVENOUS | Status: AC
  Filled 2019-10-19: qty 50

## 2019-10-19 NOTE — Progress Notes (Signed)
Cpap not being placed d/t confusion.  RT available if needed.

## 2019-10-19 NOTE — Progress Notes (Signed)
Nutrition Follow-up RD working remotely.  DOCUMENTATION CODES:   Obesity unspecified  INTERVENTION:   Ensure Enlive po TID, each supplement provides 350 kcal and 20 grams of protein, thicken to appropriate consistency.  Magic cup TID with meals, each supplement provides 290 kcal and 9 grams of protein  NUTRITION DIAGNOSIS:   Increased nutrient needs related to acute illness, catabolic [id number]) as evidenced by estimated needs.  Ongoing   GOAL:   Patient will meet greater than or equal to 90% of their needs  Unmet  MONITOR:   TF tolerance, Skin, Labs  ASSESSMENT:   74 year old female who presented on 1/26 as a transfer from [facility] where she presented with AMS. Pt found to be hypoxic and COVID-19 positive and required intubation. PMH of T2DM, CVA, HTN, HLD, OSA.  S/P MBS with SLP 2/9. Diet advanced to dysphagia 1 with honey thick liquids. Per discussion with RN, patient has been consuming approximately 25% of items on her meal trays. RN assisting patient with meals.   Cortrak tube has been removed. No longer receiving tube feeding.   Admission weight 90.7 kg, currently 100.2 kg  Labs reviewed. CBG's: [phone]  Medications reviewed and include novolog, lasix, levemir  Diet Order:   Diet Order            DIET - DYS 1 Room service appropriate? Yes; Fluid consistency: Honey Thick  Diet effective now              EDUCATION NEEDS:   No education needs have been identified at this time  Skin:  Skin Assessment: Skin Integrity Issues: Skin Integrity Issues:: Stage I Stage I: sacrum  MASD to groin  Last BM:  2/7 type 6, medium, brown  Height:   Ht Readings from Last 1 Encounters:  10/06/19 5\' 8"  (1.727 m)    Weight:   Wt Readings from Last 1 Encounters:  10/19/19 100.2 kg    Estimated Nutritional Needs:   Kcal:  2200-2400  Protein:  125-150 gm  Fluid:  >/= 1.8 L    Molli Barrows, RD, LDN, CNSC Pager [phone] After  Hours Pager [phone]

## 2019-10-19 NOTE — Progress Notes (Signed)
Physical Therapy Treatment Patient Details Name: Debra Mcconnell MRN: [id number] DOB: 01/06/1946 Today's Date: 10/19/2019    History of Present Illness Pt is a 74 y.o. female admitted 10/04/19 for acute respiratory distress secondary to COVID-19 viral PNA; ETT 1/26-2/3. Also worked up for myocarditis, AKI, combined septic and cardiogenic shock, aflutter, splenic infarct. MBS 10/18/19 with recommendation for puree with honey thick liquids. Cortrak tube pulled 10/19/19. PMH includes stroke, HTN, DM2, neuropathy, aflutter, arthritis, bilateral TKA, bilateral hammer toe.    PT Comments    Patient progressed to transfer bed>chair with two person maxA and use of Stedy. Patient more alert this session and demonstrated more of her personality during session asking therapists if they would like some iced tea. Vitals stable on tele monitor on RA during session. Continued recommendation for skilled PT services and discharge to SNF for STR.    Follow Up Recommendations  Supervision/Assistance - 24 hour;SNF     Equipment Recommendations  Wheelchair (measurements PT);Wheelchair cushion (measurements PT);Hospital bed;3in1 (PT) hoyer lift   Recommendations for Other Services       Precautions / Restrictions Precautions Precautions: Fall(cognition) Restrictions Other Position/Activity Restrictions: history of stroke with noted LUE impairments    Mobility  Bed Mobility Overal bed mobility: Needs Assistance Bed Mobility: Supine to Sit     Supine to sit: Max assist;+2 for physical assistance     General bed mobility comments: HOB elevated, hand over hand assistance for use of UEs on bedrail, draw sheet used to assist with bed mobility.  Transfers Overall transfer level: Needs assistance Equipment used: Denna Haggard) Transfers: Sit to/from Stand Sit to Stand: Max assist;+2 physical assistance;From elevated surface         General transfer comment: Attempted sit>stand from EOB with two person  assist but unable to reach full stand so bed height increased and patient able to complete full stand on trial 2, forward flexed at hips, needs cues and assist for upright standing posture.      Balance Overall balance assessment: Needs assistance Sitting-balance support: Single extremity supported;Feet supported(pt does not use LUE to assist with sitting balance) Sitting balance-Leahy Scale: Fair     Standing balance support: Bilateral upper extremity supported Standing balance-Leahy Scale: Zero Standing balance comment: two person assist with use of Stedy     Cognition Arousal/Alertness: (more awake and alert than yesterday)   Overall Cognitive Status: Impaired/Different from baseline Area of Impairment: Orientation;Attention;Following commands                 Orientation Level: Disoriented to;Time;Situation     Following Commands: Follows one step commands inconsistently              Exercises General Exercises - Lower Extremity Ankle Circles/Pumps: AAROM;5 reps;Supine Long Arc Quad: AROM;5 reps;Seated Heel Slides: AAROM;5 reps;Left    General Comments        Pertinent Vitals/Pain Pain Assessment: Faces Faces Pain Scale: Hurts a little bit Pain Location: with mobility but patient unable to quantify or locate pain Pain Intervention(s): Monitored during session;Repositioned           PT Goals (current goals can now be found in the care plan section) Acute Rehab PT Goals Patient Stated Goal: to go home to see her dog Potential to Achieve Goals: Good Progress towards PT goals: Progressing toward goals    Frequency    Min 2X/week      PT Plan Current plan remains appropriate       AM-PAC PT "6 Clicks"  Mobility   Outcome Measure  Help needed turning from your back to your side while in a flat bed without using bedrails?: Total Help needed moving from lying on your back to sitting on the side of a flat bed without using bedrails?: Total Help  needed moving to and from a bed to a chair (including a wheelchair)?: Total Help needed standing up from a chair using your arms (e.g., wheelchair or bedside chair)?: Total Help needed to walk in hospital room?: Total Help needed climbing 3-5 steps with a railing? : Total 6 Click Score: 6    End of Session   Activity Tolerance: Patient limited by fatigue Patient left: in chair;with chair alarm set;with call bell/phone within reach Nurse Communication: Mobility status;Need for lift equipment PT Visit Diagnosis: Muscle weakness (generalized) (M62.81);Difficulty in walking, not elsewhere classified (R26.2)     Time: [id number] PT Time Calculation (min) (ACUTE ONLY): 34 min  Charges:  $Therapeutic Activity: 23-37 mins                     Birdie Hopes, DPT, PT Acute Rehab [phone]    Birdie Hopes 10/19/2019, 12:46 PM

## 2019-10-19 NOTE — Progress Notes (Addendum)
PROGRESS NOTE    Debra Mcconnell  [id number] DOB: [birth year] DOA: 10/04/2019 PCP: Greig Right, MD    Brief Narrative:  Patient was admitted to the hospital with a working diagnosis of acute hypoxic respiratory failure due to SARS COVID-19 viral pneumonia.  Her hospitalization has been complicated by cardiogenic shock related to myocarditis.  74 year old female who presented with dyspnea.  Patient presented to [facility] on January 26 with acute distress, encephalopathy and refractive hypoxemia.  She was diagnosed with SARS COVID-19 viral pneumonia and placed on invasive mechanical ventilation.  She required high PEEP and FiO2.  Same day she was transferred to [facility] for further management. He does have a significant past medical history for type 2 diabetes mellitus, CVA, hypertension, dyslipidemia, atrial flutter, hypothyroidism and ulcerative sleep apnea.  Patient was treated with systemic steroids and remdesivir.  Mechanical ventilation ARDS net protocol.  She developed shock and required vasopressin.  Further work-up with echocardiography showed a reduced LV systolic function 25 to [id number] with global hypokinesis concerning for myocarditis.  Patient regained hemodynamic stability, vasopressors were discontinued and patient was successfully liberated from mechanical ventilation on February 3.  Transferred to [facility] 02/04.  Patient continued to be very weak and deconditioned, positive dysphagia and required tube feeds.  February 9 her diet was advanced to a dysphagia 1.  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 Acute hypoxic respiratory failure due to SARS COVID 19 viral pneumonia. Sp #5/5 Remdesivir.   RR: 17  Pulse oxymetry: 95%  Fi02: 21% room air.   Patient with improved dyspnea, no chest pain. Follow up chest film 02/08 with signs of pulmonary edema, personally reviewed. Will discontinue steroids for now.   Continue bronchodilators, antitussive agents, airway clearing techniques with flutter valve and incentive spirometer. Out of bed to chair as tolerated and physical/ occupational therapy.   Continue diuresis to keep negative fluid balance.   2. Acute viral myocarditis due to COVID 19. Acute systolic heart failure sp cardiogenic shock. EF 20% LV. Continue afterload reduction with hydralazine and isosorbide, b  blockade, and diuresis with furosemide.   3. HTN. Blood pressure has remain stable 131/79. Continue coreg, hydralazine and isosorbide.   4. Paroxysmal atrial flutter. Continue rate control with carvedilol and anticoagulation with apixaban.   5. Hypothyroid. Continue with levothyroxine   6. Uncontrolled T2Dm. Continue glucose cover and monitoring with insulin sliding scale. Basal insulin with 10 units detemir and meal coverage with 5 units tid of aspart.  7. OSA and obesity. Calculated BMI is 33,6.   8. AKI with urinary retention. Renal function stable.   9. Metabolic encephalopathy. Patient more awake and alert, out of bed to chair, physical and occupation therapy. Patient allowed to have po intake per speech therapy recommendations, will dc NG tube.   10. Sacrum stage one pressure ulcer. Present on admission. Continue with local wound care.   DVT prophylaxis: apixaban   Code Status:  dnr  Family Communication: no family at the bedside  Disposition Plan/ discharge barriers: patient continue critically ill/ transfer to telemetry.       Skin Documentation:  Pressure Injury 10/04/19 Sacrum Medial Stage 1 -  Intact skin with non-blanchable redness of a localized  area usually over a bony prominence. darkened area with blanching (Active)  10/04/19 1830  Location: Sacrum  Location Orientation: Medial  Staging: Stage 1 -  Intact skin with non-blanchable redness of a localized area usually over a bony prominence.  Wound Description (Comments): darkened area with blanching  Present on Admission: Yes     Subjective: Patient has difficulty hearing, but denies any chest pain or dyspnea. lookd very weak and deconditioned. Tolerating po well.   Objective: Vitals:   10/18/19 2300 10/19/19 0400 10/19/19 0500 10/19/19 0835  BP:    133/66  Pulse:      Resp:    17  Temp: 98.7 F (37.1 C) 98.9 F (37.2 C)    TempSrc: Axillary Axillary    SpO2:    95%  Weight:   100.2 kg   Height:        Intake/Output Summary (Last 24 hours) at 10/19/2019 0917 Last data filed at 10/19/2019 0517 Gross per 24 hour  Intake --  Output 700 ml  Net -700 ml   Filed Weights   10/15/19 0439 10/18/19 0500 10/19/19 0500  Weight: 100.6 kg 100.3 kg 100.2 kg    Examination:   General: Not in pain or dyspnea. Deconditioned  Neurology: Awake and alert, non focal  E ENT: mild pallor, no icterus, oral mucosa moist Cardiovascular: No JVD. S1-S2 present, rhythmic, no gallops, rubs, or murmurs. Non pitting positive lower extremity edema. Pulmonary: positive breath sounds bilaterally, decreased air movement,. Gastrointestinal. Abdomen protuberant with no organomegaly, non tender, no rebound or guarding Skin. No rashes Musculoskeletal: no joint deformities     Data Reviewed: I have personally reviewed following labs and imaging studies  CBC: Recent Labs  Lab 10/15/19 0253 10/15/19 0253 10/16/19 0332 10/16/19 0840 10/17/19 0413 10/18/19 0347 10/19/19 0345  WBC 9.6  --  9.6  --  9.3 8.9 6.6  HGB 13.2  --  12.4  --  12.6 12.2 11.8*  HCT 41.4   < > 39.4 38.6 39.3 38.9 37.9  MCV 101.2*  --  101.5*  --  101.0* 100.8* 102.2*  PLT 173  --  160  --  154 142* 142*   < > =  values in this interval not displayed.   Basic Metabolic Panel: Recent Labs  Lab 10/15/19 0020 10/15/19 0020 10/15/19 0253 10/16/19 0332 10/17/19 0413 10/18/19 0347 10/19/19 0345  NA 141   < > 143 140 139 139 139  K 4.4   < > 4.0 4.6 4.5 4.3 3.9  CL 103   < > 101 102 103 103 102  CO2 29   < > 27 28 27 28 26   GLUCOSE 81   < > 88 91 72 120* 118*  BUN 78*   < > 78* 72* 61* 57* 58*  CREATININE 0.96   < > 0.98 0.90 0.89 0.96 0.97  CALCIUM 8.3*   < > 8.0* 8.1* 8.0* 8.1* 8.2*  MG 2.1  --  2.2 2.3 2.2 2.1  --    < > = values in this interval not displayed.   GFR: Estimated Creatinine Clearance: 65.8 mL/min (by C-G formula based on SCr of 0.97 mg/dL). Liver Function Tests: No results for input(s): AST, ALT, ALKPHOS, BILITOT, PROT, ALBUMIN in the last 168 hours. No results for input(s): LIPASE, AMYLASE in the last 168 hours. No results for input(s): AMMONIA in the last 168  hours. Coagulation Profile: No results for input(s): INR, PROTIME in the last 168 hours. Cardiac Enzymes: No results for input(s): CKTOTAL, CKMB, CKMBINDEX, TROPONINI in the last 168 hours. BNP (last 3 results) No results for input(s): PROBNP in the last 8760 hours. HbA1C: No results for input(s): HGBA1C in the last 72 hours. CBG: Recent Labs  Lab 10/18/19 1532 10/18/19 2105 10/18/19 2353 10/19/19 0457 10/19/19 0710  GLUCAP 138* 183* 128* 93 145*   Lipid Profile: No results for input(s): CHOL, HDL, LDLCALC, TRIG, CHOLHDL, LDLDIRECT in the last 72 hours. Thyroid Function Tests: Recent Labs    10/17/19 0413  FREET4 1.08   Anemia Panel: No results for input(s): VITAMINB12, FOLATE, FERRITIN, TIBC, IRON, RETICCTPCT in the last 72 hours.    Radiology Studies: I have reviewed all of the imaging during this hospital visit personally     Scheduled Meds: . apixaban  5 mg Per Tube BID  . buPROPion  100 mg Per Tube TID  . carvedilol  3.125 mg Oral BID  . chlorhexidine  15 mL Mouth/Throat BID  .  Chlorhexidine Gluconate Cloth  6 each Topical Daily  . famotidine  20 mg Per Tube Daily  . free water  200 mL Per Tube Q6H  . furosemide  20 mg Oral Daily  . gabapentin  200 mg Per Tube Q12H  . Gerhardt's butt cream   Topical Daily  . hydrALAZINE  50 mg Oral TID  . influenza vaccine adjuvanted  0.5 mL Intramuscular Tomorrow-1000  . insulin aspart  0-15 Units Subcutaneous Q4H  . insulin aspart  5 Units Subcutaneous Q4H  . insulin detemir  10 Units Subcutaneous Daily  . ipratropium  2 puff Inhalation Q8H  . levalbuterol  1-2 puff Inhalation Q8H  . levothyroxine  175 mcg Per Tube Daily  . mouth rinse  15 mL Mouth Rinse 10 times per day  . pneumococcal 23 valent vaccine  0.5 mL Intramuscular Tomorrow-1000  . sodium chloride flush  10-40 mL Intracatheter Q12H   Continuous Infusions: . sodium chloride 250 mL (10/05/19 1208)  . feeding supplement (VITAL AF 1.2 CAL) 80 mL/hr at 10/18/19 1527     LOS: 15 days        Amberrose Friebel Gerome Apley, MD

## 2019-10-19 NOTE — Progress Notes (Signed)
Per report given at change of shift. Patient was not tolerating oral intake. Pt was coughing even with oral care. Will monitor throughout the shift.

## 2019-10-19 NOTE — Progress Notes (Signed)
  Speech Language Pathology Treatment: Dysphagia  Patient Details Name: Debra Mcconnell MRN: [id number] DOB: [birth year] Today's Date: 10/19/2019 Time: [id number] SLP Time Calculation (min) (ACUTE ONLY): 20 min  Assessment / Plan / Recommendation Clinical Impression  Significant improvements from yesterday. Encountered sleeping in chair with food tray in front her untouched. She awakened easily, bright eyed and remained awake and conversive throughout session. Confusion persists however oriented to place and situation (could not say virus but eluded to it). Word finding difficulty evident. Appropriately asking how long will it take to "get better".   When cup (puree pineapples) and spoon placed in hand she was able to feed herself whole container and consumed 4 oz honey thick juice. Frequent verbalizations after swallowing, immediate throat clears, cough x 1 and wet vocal quality (x 1). Subjectively she may be clearing potential penetrates indicated by clear vocal quality after reflexive throat clearing. Continue puree and honey thick liquids- pt needs FULL SUPERVISION and assist to initiate feeding and manipulate utensils.     HPI HPI: 74 year old with history of DM2, CVA, HTN, HLD, atrial flutter, hypothyroidism, OSA presented from [place] with diagnosis of COVID-19/hypoxia, acute kidney injury and hyperglycemia.  CTA chest was negative for PE, CT head, negative.  She required intubation for hypoxic respiratory failure on 1/26 then eventually extubated on 2/3.  Echocardiogram showed new diagnosis of CHF with a EF of 25-30% with global hypokinesia concerning for myocarditis.  Received steroids, completed remdesivir. Continued with + s/s aspiration with thin and MBS recommended.        SLP Plan  Continue with current plan of care       Recommendations  Diet recommendations: Dysphagia 1 (puree);Honey-thick liquid Liquids provided via: Cup Medication Administration: Crushed with  puree Supervision: Staff to assist with self feeding;Full supervision/cueing for compensatory strategies Compensations: Minimize environmental distractions;Slow rate;Small sips/bites Postural Changes and/or Swallow Maneuvers: Seated upright 90 degrees                Oral Care Recommendations: Oral care BID Follow up Recommendations: Skilled Nursing facility SLP Visit Diagnosis: Dysphagia, oropharyngeal phase (R13.12) Plan: Continue with current plan of care                       Houston Siren 10/19/2019, 3:49 PM  Orbie Pyo Colvin Caroli.Ed Risk analyst [phone] Office [phone]

## 2019-10-20 DIAGNOSIS — D6489 Other specified anemias: Secondary | ICD-10-CM | POA: Diagnosis not present

## 2019-10-20 DIAGNOSIS — E0841 Diabetes mellitus due to underlying condition with diabetic mononeuropathy: Secondary | ICD-10-CM | POA: Diagnosis not present

## 2019-10-20 DIAGNOSIS — I509 Heart failure, unspecified: Secondary | ICD-10-CM | POA: Diagnosis not present

## 2019-10-20 DIAGNOSIS — E1169 Type 2 diabetes mellitus with other specified complication: Secondary | ICD-10-CM | POA: Diagnosis not present

## 2019-10-20 DIAGNOSIS — R2681 Unsteadiness on feet: Secondary | ICD-10-CM | POA: Diagnosis not present

## 2019-10-20 DIAGNOSIS — J9600 Acute respiratory failure, unspecified whether with hypoxia or hypercapnia: Secondary | ICD-10-CM | POA: Diagnosis not present

## 2019-10-20 DIAGNOSIS — Z79899 Other long term (current) drug therapy: Secondary | ICD-10-CM | POA: Diagnosis not present

## 2019-10-20 DIAGNOSIS — M6281 Muscle weakness (generalized): Secondary | ICD-10-CM | POA: Diagnosis not present

## 2019-10-20 DIAGNOSIS — L89300 Pressure ulcer of unspecified buttock, unstageable: Secondary | ICD-10-CM | POA: Diagnosis not present

## 2019-10-20 DIAGNOSIS — S50822A Blister (nonthermal) of left forearm, initial encounter: Secondary | ICD-10-CM | POA: Diagnosis not present

## 2019-10-20 DIAGNOSIS — I480 Paroxysmal atrial fibrillation: Secondary | ICD-10-CM | POA: Diagnosis not present

## 2019-10-20 DIAGNOSIS — G4733 Obstructive sleep apnea (adult) (pediatric): Secondary | ICD-10-CM | POA: Diagnosis not present

## 2019-10-20 DIAGNOSIS — K5901 Slow transit constipation: Secondary | ICD-10-CM | POA: Diagnosis not present

## 2019-10-20 DIAGNOSIS — E118 Type 2 diabetes mellitus with unspecified complications: Secondary | ICD-10-CM | POA: Diagnosis not present

## 2019-10-20 DIAGNOSIS — E119 Type 2 diabetes mellitus without complications: Secondary | ICD-10-CM | POA: Diagnosis not present

## 2019-10-20 DIAGNOSIS — I5189 Other ill-defined heart diseases: Secondary | ICD-10-CM | POA: Diagnosis not present

## 2019-10-20 DIAGNOSIS — N390 Urinary tract infection, site not specified: Secondary | ICD-10-CM | POA: Diagnosis not present

## 2019-10-20 DIAGNOSIS — R262 Difficulty in walking, not elsewhere classified: Secondary | ICD-10-CM | POA: Diagnosis not present

## 2019-10-20 DIAGNOSIS — I5082 Biventricular heart failure: Secondary | ICD-10-CM | POA: Diagnosis not present

## 2019-10-20 DIAGNOSIS — I400 Infective myocarditis: Secondary | ICD-10-CM | POA: Diagnosis not present

## 2019-10-20 DIAGNOSIS — E668 Other obesity: Secondary | ICD-10-CM | POA: Diagnosis not present

## 2019-10-20 DIAGNOSIS — G4730 Sleep apnea, unspecified: Secondary | ICD-10-CM | POA: Diagnosis not present

## 2019-10-20 DIAGNOSIS — E785 Hyperlipidemia, unspecified: Secondary | ICD-10-CM | POA: Diagnosis not present

## 2019-10-20 DIAGNOSIS — R1312 Dysphagia, oropharyngeal phase: Secondary | ICD-10-CM | POA: Diagnosis not present

## 2019-10-20 DIAGNOSIS — E1139 Type 2 diabetes mellitus with other diabetic ophthalmic complication: Secondary | ICD-10-CM | POA: Diagnosis not present

## 2019-10-20 DIAGNOSIS — F39 Unspecified mood [affective] disorder: Secondary | ICD-10-CM | POA: Diagnosis not present

## 2019-10-20 DIAGNOSIS — I5021 Acute systolic (congestive) heart failure: Secondary | ICD-10-CM | POA: Diagnosis not present

## 2019-10-20 DIAGNOSIS — Z23 Encounter for immunization: Secondary | ICD-10-CM | POA: Diagnosis not present

## 2019-10-20 DIAGNOSIS — R278 Other lack of coordination: Secondary | ICD-10-CM | POA: Diagnosis not present

## 2019-10-20 DIAGNOSIS — L89301 Pressure ulcer of unspecified buttock, stage 1: Secondary | ICD-10-CM | POA: Diagnosis not present

## 2019-10-20 DIAGNOSIS — M533 Sacrococcygeal disorders, not elsewhere classified: Secondary | ICD-10-CM | POA: Diagnosis not present

## 2019-10-20 DIAGNOSIS — R41841 Cognitive communication deficit: Secondary | ICD-10-CM | POA: Diagnosis not present

## 2019-10-20 DIAGNOSIS — U071 COVID-19: Secondary | ICD-10-CM | POA: Diagnosis not present

## 2019-10-20 DIAGNOSIS — Z8616 Personal history of COVID-19: Secondary | ICD-10-CM | POA: Diagnosis not present

## 2019-10-20 DIAGNOSIS — R600 Localized edema: Secondary | ICD-10-CM | POA: Diagnosis not present

## 2019-10-20 DIAGNOSIS — N289 Disorder of kidney and ureter, unspecified: Secondary | ICD-10-CM | POA: Diagnosis not present

## 2019-10-20 DIAGNOSIS — R195 Other fecal abnormalities: Secondary | ICD-10-CM | POA: Diagnosis not present

## 2019-10-20 DIAGNOSIS — L89154 Pressure ulcer of sacral region, stage 4: Secondary | ICD-10-CM | POA: Diagnosis not present

## 2019-10-20 DIAGNOSIS — Z7401 Bed confinement status: Secondary | ICD-10-CM | POA: Diagnosis not present

## 2019-10-20 DIAGNOSIS — I10 Essential (primary) hypertension: Secondary | ICD-10-CM | POA: Diagnosis not present

## 2019-10-20 DIAGNOSIS — Z8673 Personal history of transient ischemic attack (TIA), and cerebral infarction without residual deficits: Secondary | ICD-10-CM | POA: Diagnosis not present

## 2019-10-20 DIAGNOSIS — L0390 Cellulitis, unspecified: Secondary | ICD-10-CM | POA: Diagnosis not present

## 2019-10-20 DIAGNOSIS — Z9181 History of falling: Secondary | ICD-10-CM | POA: Diagnosis not present

## 2019-10-20 DIAGNOSIS — I4892 Unspecified atrial flutter: Secondary | ICD-10-CM | POA: Diagnosis not present

## 2019-10-20 DIAGNOSIS — E8809 Other disorders of plasma-protein metabolism, not elsewhere classified: Secondary | ICD-10-CM | POA: Diagnosis not present

## 2019-10-20 DIAGNOSIS — J1282 Pneumonia due to coronavirus disease 2019: Secondary | ICD-10-CM | POA: Diagnosis not present

## 2019-10-20 DIAGNOSIS — E7849 Other hyperlipidemia: Secondary | ICD-10-CM | POA: Diagnosis not present

## 2019-10-20 DIAGNOSIS — R2689 Other abnormalities of gait and mobility: Secondary | ICD-10-CM | POA: Diagnosis not present

## 2019-10-20 DIAGNOSIS — G9341 Metabolic encephalopathy: Secondary | ICD-10-CM | POA: Diagnosis not present

## 2019-10-20 DIAGNOSIS — R0902 Hypoxemia: Secondary | ICD-10-CM | POA: Diagnosis not present

## 2019-10-20 DIAGNOSIS — L259 Unspecified contact dermatitis, unspecified cause: Secondary | ICD-10-CM | POA: Diagnosis not present

## 2019-10-20 DIAGNOSIS — R300 Dysuria: Secondary | ICD-10-CM | POA: Diagnosis not present

## 2019-10-20 DIAGNOSIS — J9601 Acute respiratory failure with hypoxia: Secondary | ICD-10-CM | POA: Diagnosis not present

## 2019-10-20 DIAGNOSIS — Z794 Long term (current) use of insulin: Secondary | ICD-10-CM | POA: Diagnosis not present

## 2019-10-20 DIAGNOSIS — L89150 Pressure ulcer of sacral region, unstageable: Secondary | ICD-10-CM | POA: Diagnosis not present

## 2019-10-20 DIAGNOSIS — I514 Myocarditis, unspecified: Secondary | ICD-10-CM | POA: Diagnosis not present

## 2019-10-20 DIAGNOSIS — R5381 Other malaise: Secondary | ICD-10-CM | POA: Diagnosis not present

## 2019-10-20 DIAGNOSIS — I748 Embolism and thrombosis of other arteries: Secondary | ICD-10-CM | POA: Diagnosis not present

## 2019-10-20 DIAGNOSIS — M2550 Pain in unspecified joint: Secondary | ICD-10-CM | POA: Diagnosis not present

## 2019-10-20 DIAGNOSIS — S41101A Unspecified open wound of right upper arm, initial encounter: Secondary | ICD-10-CM | POA: Diagnosis not present

## 2019-10-20 DIAGNOSIS — E038 Other specified hypothyroidism: Secondary | ICD-10-CM | POA: Diagnosis not present

## 2019-10-20 LAB — GLUCOSE, CAPILLARY
Glucose-Capillary: 145 mg/dL — ABNORMAL HIGH (ref 70–99)
Glucose-Capillary: 182 mg/dL — ABNORMAL HIGH (ref 70–99)
Glucose-Capillary: 96 mg/dL (ref 70–99)
Glucose-Capillary: 99 mg/dL (ref 70–99)

## 2019-10-20 LAB — BASIC METABOLIC PANEL
Anion gap: 9 (ref 5–15)
BUN: 59 mg/dL — ABNORMAL HIGH (ref 8–23)
CO2: 27 mmol/L (ref 22–32)
Calcium: 8.5 mg/dL — ABNORMAL LOW (ref 8.9–10.3)
Chloride: 105 mmol/L (ref 98–111)
Creatinine, Ser: 0.91 mg/dL (ref 0.44–1.00)
GFR calc Af Amer: >60 mL/min (ref >60)
GFR calc non Af Amer: >60 mL/min (ref >60)
Glucose, Bld: 111 mg/dL — ABNORMAL HIGH (ref 70–99)
Potassium: 3.6 mmol/L (ref 3.5–5.1)
Sodium: 141 mmol/L (ref 135–145)

## 2019-10-20 MED ORDER — BUPROPION HCL 100 MG PO TABS: 100.0000 mg | ORAL_TABLET | Freq: Three times a day (TID) | ORAL | 0 refills | Status: DC

## 2019-10-20 MED ORDER — GABAPENTIN 250 MG/5ML PO SOLN: 200.0000 mg | Freq: Two times a day (BID) | ORAL | 0 refills | Status: DC

## 2019-10-20 MED ORDER — CARVEDILOL 3.125 MG PO TABS: 3.1250 mg | ORAL_TABLET | Freq: Two times a day (BID) | ORAL | 0 refills | Status: DC

## 2019-10-20 MED ORDER — ENSURE ENLIVE PO LIQD: 237.0000 mL | Freq: Three times a day (TID) | ORAL | 0 refills | Status: AC

## 2019-10-20 NOTE — Discharge Summary (Signed)
Physician Discharge Summary  Debra Mcconnell [id number] DOB: [birth year] DOA: 10/04/2019  PCP: Greig Right, MD  Admit date: 10/04/2019 Discharge date: 10/20/2019  Admitted From: Home  Disposition:  SNF  Recommendations for Outpatient Follow-up and new medication changes:  1. Follow up with Dr. Burnett Sheng in 2 weeks.  2. Carvedilol dose has been reduced. 3. Holding antihypertensive medications for now due to risk of hypotension. 4. Continue diuresis with furosemide.   Home Health: na   Equipment/Devices: na    Discharge Condition: stable  CODE STATUS: full  Diet recommendation: heart healthy and diabetic prudent.   Recommendations  Diet recommendations: Dysphagia 1 (puree);Honey-thick liquid Liquids provided via: Cup Medication Administration: Crushed with puree Supervision: Staff to assist with self feeding;Full supervision/cueing for compensatory strategies Compensations: Minimize environmental distractions;Slow rate;Small sips/bites Postural Changes and/or Swallow Maneuvers: Seated upright 90 degrees     Brief/Interim Summary: Patient was admitted to the hospital with a working diagnosis of acute hypoxic respiratory failure due to SARS COVID-19 viral pneumonia.  Her hospitalization has been complicated by cardiogenic shock related to myocarditis.  74 year old female who presented with dyspnea.  Patient presented to [facility] on January 26 with acute respiratory distress, encephalopathy and refractive hypoxemia.  She was diagnosed with SARS COVID-19 viral pneumonia and placed on invasive mechanical ventilation.  She required high PEEP and FiO2.  Same day she was transferred to [facility] for further management. She does have a significant past medical history for type 2 diabetes mellitus, CVA, hypertension, dyslipidemia, atrial flutter, hypothyroidism and obstructvie sleep apnea. Her chest radiograph had bilateral multilobar interstitial infiltrates.  Patient  was treated with systemic steroids and remdesivir.  Mechanical ventilation per ARDS net protocol.    Unfortunately she developed shock and required vasopressin.  Further work-up with echocardiography showed a reduced LV systolic function 25 to [id number] with global hypokinesis concerning for myocarditis.  Patient regained hemodynamic stability, vasopressors were discontinued and patient was successfully liberated from mechanical ventilation on February 3.  Transferred to [facility] hospitalist team on 02/04.  Patient continued to be very weak and deconditioned, positive dysphagia and required tube feeds.  February 9 her diet was advanced to a dysphagia 1. Patient's mentation continue to improve and remained hemodynamically stable.   Plan transfer to SNF for further physical therapy.   1.  Acute hypoxic respiratory failure due to SARS COVID-19 viral pneumonia.  Patient had a prolonged hospitalization, patient required invasive mechanical ventilation per ARDS net protocol.  She received medical therapy with remdesivir and systemic corticosteroids.  She was successfully liberated from mechanical ventilation on February 3.  Her oximetry is 94% on room air.  She has developed significant deconditioning, physical therapy has recommended skilled nursing facility to continue physical rehabilitation.  2.  Acute viral myocarditis due to [id number], complicated with acute on chronic systolic heart failure and cardiogenic shock/present on admission.  While on mechanical ventilation patient developed shock, she received vasopressors with good response.  Further work-up with echocardiography showed LV systolic function 25 to [id number] with global hypokinesis the right ventricle had severely reduced systolic function.  With supportive medical therapy patient regained hemodynamic stability.  Follow-up echocardiography showed ejection fraction left ventricle 30 to 35%, moderate hypokinesis of the entire inferior septal wall and inferior  wall.  The right ventricle had normal systolic function.   Patient was placed on carvedilol and received diuresis with furosemide.  Afterload reduction with hydralazine.  Follow-up with cardiology as an outpatient.  3.  Hypertension.  Patient has chronic  hypertension, she will be discharged with carvedilol and hydralazine.  4.  Paroxysmal atrial flutter.  Continue rate control with carvedilol, anticoagulation with apixaban.  5.  Hypothyroid.  Continue levothyroxine.  6.  Type 2 diabetes mellitus.  Hemoglobin A1c is 6.4.  Steroid-induced hyperglycemia. Dyslipidemia.  Patient was placed on insulin regimen, basal, meal coverage and sliding scale with good toleration.  At discharge patient will resume her usual diabetic regimen with glimepiride and exenatide.  Continue statin therapy.   7.  Obesity with obstructive sleep apnea.  Her calculated BMI is 33.6, continue to encourage mobility.  Will need further weight reduction as an outpatient.  8.  Acute kidney injury urinary retention.  Patient's kidney function remained stable, no further urine retention.  9.  Metabolic encephalopathy.  Patient received supportive medical therapy, slowly her mentation has been improving, initially high risk of aspiration and required NG tube along with tube feeds.  On February 9 speech therapy recommended to advance diet.  Patient has been on dysphagia 1 diet with good toleration.  10.  Sacrum stage I pressure ulcer, present on admission.  Continue local wound care.   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   Increase activity slowly   Complete by: As directed    MyChart COVID-19 home monitoring program   Complete by: Oct 20, 2019    Is the patient willing to use the [facility] for home monitoring?: Yes   Temperature monitoring   Complete by: Oct 20, 2019    After how many days would you like to receive a notification of this patient's flowsheet entries?: 1     Allergies as of 10/20/2019      Reactions   Gabapentin Other (See Comments)   Dizziness and drowsiness   Lisinopril Cough      Medication List    STOP taking these medications   amLODipine 10 MG tablet Commonly known as: NORVASC   buPROPion 300 MG 24 hr tablet Commonly known as: WELLBUTRIN XL Replaced by: buPROPion 100 MG tablet   ciprofloxacin 500 MG tablet Commonly known as: Cipro   clindamycin 300 MG capsule Commonly known as: Cleocin   gabapentin 300 MG capsule Commonly known as: NEURONTIN Replaced by: gabapentin 250 MG/5ML solution   losartan 100 MG tablet Commonly known as: COZAAR   olmesartan 40 MG tablet Commonly known as: BENICAR   zolpidem 5 MG tablet Commonly known as: AMBIEN     TAKE these medications   apixaban 5 MG Tabs tablet Commonly known as: Eliquis Take 1 tablet (5 mg total) by mouth 2 (two) times daily.   atorvastatin 40 MG tablet Commonly known as: LIPITOR Take 40 mg by mouth daily.   buPROPion 100 MG tablet Commonly known as: WELLBUTRIN Take 1 tablet (100 mg total) by mouth 3 (three) times daily. Replaces: buPROPion 300 MG 24 hr tablet   carvedilol  3.125 MG tablet Commonly known as: COREG Take 1 tablet (3.125 mg total) by mouth 2 (two) times daily. What changed:   medication strength  how much to take   Exenatide ER 2 MG Srer Inject 2 mg into the skin once a week.   feeding supplement  (ENSURE ENLIVE) Liqd Take 237 mLs by mouth 3 (three) times daily between meals.   ferrous sulfate 325 (65 FE) MG EC tablet Take 325 mg by mouth 2 (two) times daily with a meal.   furosemide 20 MG tablet Commonly known as: LASIX Take 20 mg by mouth daily.   gabapentin 250 MG/5ML solution Commonly known as: NEURONTIN Take 4 mLs (200 mg total) by mouth every 12 (twelve) hours. Replaces: gabapentin 300 MG capsule   glimepiride 1 MG tablet Commonly known as: AMARYL Take 1-2 mg by mouth See admin instructions. Take 2 mg by mouth in the morning and 1 mg in the evening   hydrALAZINE 50 MG tablet Commonly known as: APRESOLINE Take 50 mg by mouth 3 (three) times daily.   levothyroxine 175 MCG tablet Commonly known as: SYNTHROID Take 175 mcg by mouth daily.   omeprazole 20 MG capsule Commonly known as: PRILOSEC Take 20 mg by mouth daily.   potassium chloride 10 MEQ tablet Commonly known as: KLOR-CON Take 15 mEq by mouth daily.   VITAMIN B 12 PO Take 1,000 mcg by mouth daily.   Vitamin D 50 MCG (2000 UT) Caps Take 4,000 Units by mouth 2 (two) times daily.       Contact information for follow-up providers    Greig Right, MD Follow up in 2 week(s).   Specialty: Family Medicine Contact information: [place] [address] [phone]            Contact information for after-discharge care    Destination    [facility]erred [facility] .   Service: Skilled Nursing Contact information: [place] [address] [phone]                 Allergies  Allergen Reactions  . Gabapentin Other (See Comments)    Dizziness and drowsiness  . Lisinopril Cough    Consultations:  Cardiology   Critical Care   Procedures/Studies: CT HEAD WO CONTRAST  Result Date: 10/17/2019 CLINICAL DATA:  Transient ischemic attack (TIA), dysarthria. EXAM: CT HEAD WITHOUT CONTRAST TECHNIQUE: Contiguous axial images were  obtained from the base of the skull through the vertex without intravenous contrast. COMPARISON:  CT head/cervical spine 10/04/2019, head CT 02/06/2016, brain MRI 09/14/2013 FINDINGS: Brain: Again demonstrated are chronic bilateral posterior MCA territory cortically based infarcts. On the right, chronic infarction changes are present within portions of the right parietal, temporal and occipital lobes. On the left, chronic infarction changes are predominantly present within the left temporoparietal lobes. Known chronic lacunar infarcts within the thalami, pons and left basal cerebellum were better appreciated on brain MRI 09/14/2013. No acute demarcated cortical infarct is identified. No evidence of acute intracranial hemorrhage. No evidence of intracranial mass. No midline shift or extra-axial fluid collection. Stable background generalized parenchymal atrophy and chronic small vessel ischemic disease. Vascular: No hyperdense vessel. Atherosclerotic calcifications. Skull: Normal. Negative for fracture or focal lesion. Sinuses/Orbits: Visualized orbits demonstrate no acute abnormality. Mild mucosal thickening and small air-fluid level within the left maxillary sinus. Partial opacification of right mastoid air cells. IMPRESSION: No evidence of acute intracranial abnormality. Generalized parenchymal atrophy and chronic small vessel ischemic disease with  multiple chronic infarcts as described. Mild mucosal thickening and small air-fluid level within the left maxillary sinus. Small right mastoid effusion. Electronically Signed   By: Kellie Simmering DO   On: 10/17/2019 13:06   DG Chest Port 1 View  Result Date: 10/17/2019 CLINICAL DATA:  Dyspnea. EXAM: PORTABLE CHEST 1 VIEW COMPARISON:  10/12/2019 FINDINGS: Patient is rotated towards the left. Persistent densities or consolidation at the left lung base. Heart size is grossly stable but difficult to evaluate the size based on the patient rotation. Feeding tube extends into  the abdomen. Again noted are prominent interstitial lung densities. Atherosclerotic calcifications at the aortic arch. IMPRESSION: 1. Persistent opacification at the left lung base. Findings are suggestive for consolidation or pleural fluid at the left lung base. 2. Prominent interstitial lung densities have minimally changed. Findings are suggestive for mild interstitial edema. 3. Feeding tube extends in the abdomen but the tip is beyond the image. Electronically Signed   By: Markus Daft M.D.   On: 10/17/2019 08:54   DG Chest Port 1 View  Result Date: 10/12/2019 CLINICAL DATA:  Acute respiratory failure. EXAM: PORTABLE CHEST 1 VIEW COMPARISON:  10/11/2019 FINDINGS: Right IJ catheter tip is at the cavoatrial junction. ET tube tip is above the carina. There is a feeding tube with tip below the GE junction. Persistent left pleural effusion and left lower lobe airspace consolidation and or atelectasis. IMPRESSION: Persistent left pleural effusion with left lower lobe airspace consolidation and atelectasis. Electronically Signed   By: Kerby Moors M.D.   On: 10/12/2019 09:03   DG Chest Port 1 View  Result Date: 10/11/2019 CLINICAL DATA:  Hypoxia and acute respiratory failure in a COVID-19 positive patient. EXAM: PORTABLE CHEST 1 VIEW COMPARISON:  Single-view of the chest 10/09/2019 and 10/08/2019. FINDINGS: Endotracheal tube, right IJ catheter and feeding tube remain in place. Bilateral airspace disease has markedly improved over the past 2 days. The right lung is now clear. Left basilar airspace opacity persists. Heart size is upper normal. No pneumothorax. There may be a small left pleural effusion. IMPRESSION: Support tubes and lines are unchanged. Improved bilateral airspace disease with residual opacity in left lung base noted. Electronically Signed   By: Inge Rise M.D.   On: 10/11/2019 08:57   DG Chest Port 1 View  Result Date: 10/09/2019 CLINICAL DATA:  Respiratory failure EXAM: PORTABLE CHEST 1  VIEW COMPARISON:  Chest radiograph 10/08/2019 FINDINGS: Right IJ central venous catheter tip projects over the superior vena cava. Enteric tube courses inferior to the diaphragm. ET tube terminates mid trachea. Monitoring leads overlie the patient. Stable cardiomegaly. Slight interval improvement in bilateral airspace opacities. Persistent small left pleural effusion and retrocardiac consolidation. IMPRESSION: Slight interval improvement diffuse bilateral airspace opacities. Electronically Signed   By: Lovey Newcomer M.D.   On: 10/09/2019 07:01   DG Chest Port 1 View  Result Date: 10/08/2019 CLINICAL DATA:  Respiratory failure. EXAM: PORTABLE CHEST 1 VIEW COMPARISON:  Chest radiograph 10/07/2019. FINDINGS: Enteric tube courses inferior to the diaphragm. ET tube terminates in the mid trachea. Right IJ central venous catheter tip projects over the right atrium. Monitoring leads overlie the patient. Stable cardiomegaly. Similar-appearing bilateral airspace opacities, left greater than right. Probable small left pleural effusion. No pneumothorax. Thoracic spine degenerative changes. IMPRESSION: Similar-appearing left greater than right airspace opacities. Probable small left pleural effusion. Electronically Signed   By: Lovey Newcomer M.D.   On: 10/08/2019 06:42   DG CHEST PORT 1 VIEW  Result Date: 10/07/2019  CLINICAL DATA:  Respiratory failure.  Hypoxia. EXAM: PORTABLE CHEST 1 VIEW COMPARISON:  10/06/2019.  10/05/2019. FINDINGS: Endotracheal tube tip noted 4 cm above the carina. Feeding tube tip below left hemidiaphragm. Right IJ line tip at cavoatrial junction. Stable cardiomegaly. Persistent bilateral pulmonary infiltrates/edema left side greater right. Slight progression noted the left upper lung. Small 2 moderate left pleural effusion. No pneumothorax. IMPRESSION: 1. Endotracheal tube tip noted 4 cm above the carina. Feeding tube and PICC line in stable position. 2.  Stable cardiomegaly. 3. Persistent bilateral  pulmonary infiltrates/edema, left side greater right. Slight progression noted in the left upper lung. Small to moderate left pleural effusion. Electronically Signed   By: Marcello Moores  Register   On: 10/07/2019 06:08   DG CHEST PORT 1 VIEW  Result Date: 10/06/2019 CLINICAL DATA:  Respiratory failure, hypoxia EXAM: PORTABLE CHEST 1 VIEW COMPARISON:  10/05/2019 FINDINGS: No significant change in rotated AP portable examination, support apparatus including endotracheal tube, partially imaged enteric feeding tube, and right neck vascular catheter. Cardiomegaly. Diffuse interstitial pulmonary opacity and atelectasis or consolidation of the left lung base, with layering bilateral pleural effusions. No new or focal airspace opacity. Unchanged cardiomegaly. IMPRESSION: No significant interval change in the appearance of the chest with diffuse interstitial pulmonary opacity, likely edema, atelectasis or consolidation of the left lung base, and layering pleural effusions. Electronically Signed   By: Eddie Candle M.D.   On: 10/06/2019 08:10   DG Chest Port 1 View  Result Date: 10/05/2019 CLINICAL DATA:  Central line placement EXAM: PORTABLE CHEST 1 VIEW COMPARISON:  Earlier same day FINDINGS: Patient is significantly rotated. Right IJ central line overlies region of cavoatrial junction. Endotracheal and enteric tubes are again present. Stable lung aeration. No pneumothorax. IMPRESSION: Right IJ central line tip overlies cavoatrial junction. No pneumothorax. Stable lung aeration. Electronically Signed   By: Macy Mis M.D.   On: 10/05/2019 16:30   DG Chest Port 1 View  Result Date: 10/05/2019 CLINICAL DATA:  ETT, central line placement EXAM: PORTABLE CHEST 1 VIEW COMPARISON:  Radiograph 10/04/2019 FINDINGS: *Endotracheal tube terminates 4 cm from the carina in the mid trachea. *Transesophageal tube tip and side port distal to the GE junction, terminating below the level of imaging. *Telemetry leads and external  catheter tubing overlie the chest. Increasingly confluent opacity is seen in the right infrahilar lung. There is worsening indistinctness of the left hemidiaphragm with increasing retrocardiac opacity as well possibly reflecting accumulating pleural fluid and/or airspace disease. No visible right effusion though the costophrenic sulcus is partially collimated. No pneumothorax. No acute osseous or soft tissue abnormality. Degenerative changes are present in the imaged spine and shoulders. IMPRESSION: 1. Increasing confluent opacity in the right infrahilar lung. 2. Worsening indistinctness of the left hemidiaphragm with increasing retrocardiac opacity possibly reflecting accumulating pleural fluid, worsening airspace disease or atelectasis. Electronically Signed   By: Lovena Le M.D.   On: 10/05/2019 06:16   DG Chest Port 1 View  Result Date: 10/04/2019 CLINICAL DATA:  Endotracheal tube position. Respiratory failure. EXAM: PORTABLE CHEST 1 VIEW COMPARISON:  Earlier chest x-ray, same date. FINDINGS: The endotracheal tube is 4.5 cm above the carina. The NG tube is coursing down the esophagus and into the stomach. Much improved lung aeration with re-expansion of the left upper lobe. The right lung is also much better aerated. Persistent left effusion. IMPRESSION: 1. Endotracheal tube and NG tubes in good position. 2. Much improved lung aeration since earlier film. Electronically Signed   By: Mamie Nick.  Gallerani M.D.   On: 10/04/2019 17:56   DG Swallowing Func-Speech Pathology  Result Date: 10/18/2019 Objective Swallowing Evaluation: Type of Study: MBS-Modified Barium Swallow Study  Patient Details Name: RAJEAN URWIN MRN: [id number] Date of Birth: 1946-06-15 Today's Date: 10/18/2019 Time: SLP Start Time (ACUTE ONLY): [id number] -SLP Stop Time (ACUTE ONLY): 1324 SLP Time Calculation (min) (ACUTE ONLY): 19 min Past Medical History: Past Medical History: Diagnosis Date . Acquired hammer toes of both feet 01/22/2017 . Arthritis  11/29/2013 . Atrial flutter ([place]) 01/24/2014  Overview:  CHADS2 vasc score= 5 . B-complex deficiency 11/29/2013 . Cerebral artery occlusion with cerebral infarction ([place]) 12/04/2013  Overview:  STORY: MRI +ve Left frontoparietal infarction (LMCA). +AFib.`E1o3L`  BP is under controlled. Continue home health. +AFib, con't Eliquis 5mg  bid. Will obtain old record from [facility] for review. . Diabetic polyneuropathy associated with type 2 diabetes mellitus ([place]) 01/22/2017 . Essential hypertension 11/13/2015 . Hyperlipemia, retention 11/13/2015 . OSA (obstructive sleep apnea) 01/24/2014  Overview:  IMPRESSION: Will obtain old record for review and ask DME to d/l compliance and fax the result to me. F/u in 6 wks. . Pre-ulcerative corn or callous 01/22/2017 . Stroke ([place]) 01/24/2014 . Thyroid disease 11/29/2013 . Type II diabetes mellitus with ophthalmic manifestations ([place]) 11/29/2013 Past Surgical History: Past Surgical History: Procedure Laterality Date . CHOLECYSTECTOMY   . GASTROCYSTOPLASTY   . KNEE SURGERY   HPI: 74 year old with history of DM2, CVA, HTN, HLD, atrial flutter, hypothyroidism, OSA presented from [place] with diagnosis of COVID-19/hypoxia, acute kidney injury and hyperglycemia.  CTA chest was negative for PE, CT head, negative.  She required intubation for hypoxic respiratory failure on 1/26 then eventually extubated on 2/3.  Echocardiogram showed new diagnosis of CHF with a EF of 25-30% with global hypokinesia concerning for myocarditis.  Received steroids, completed remdesivir. Continued with + s/s aspiration with thin and MBS recommended.   No data recorded Assessment / Plan / Recommendation CHL IP CLINICAL IMPRESSIONS 10/18/2019 Clinical Impression Oropharyngeal dysphagia markedy by dysnchrony of protective mechanisms leading to aspiration. Pt sleepy but arousable with minimal stimulation needed throughout. Oral cohesion and bolus formation mildly effortful, prolonged with decreased cohesion  but managed to form bolus for posterior transfer. Her initiation of laryngeal elevation was late and nectar barium reached valleculae and entered laryngeal vestibule before full epiglottic inversion and barium was aspirated during the swallow followed by strong reflexive cough. Honey thick remained cohesive and was not penetrated nor aspirated. Pharyngeal residue was not significant. It is recommended she continue puree (Dys 1 texture), and can initiate honey thick liquids, crush pills, sit upright, full assist with feeding and cues for swallowing. ST will follow; adequate intake may be a challenge given lethargy and altered cognition.       SLP Visit Diagnosis Dysphagia, oropharyngeal phase (R13.12) Attention and concentration deficit following -- Frontal lobe and executive function deficit following -- Impact on safety and function Moderate aspiration risk   CHL IP TREATMENT RECOMMENDATION 10/18/2019 Treatment Recommendations Therapy as outlined in treatment plan below   Prognosis 10/18/2019 Prognosis for Safe Diet Advancement Good Barriers to Reach Goals Cognitive deficits Barriers/Prognosis Comment -- CHL IP DIET RECOMMENDATION 10/18/2019 SLP Diet Recommendations Dysphagia 1 (Puree) solids;Honey thick liquids Liquid Administration via Cup Medication Administration Crushed with puree Compensations Slow rate;Small sips/bites;Lingual sweep for clearance of pocketing Postural Changes Seated upright at 90 degrees   CHL IP OTHER RECOMMENDATIONS 10/18/2019 Recommended Consults -- Oral Care Recommendations Oral care BID Other Recommendations Order thickener from  pharmacy   CHL IP FOLLOW UP RECOMMENDATIONS 10/18/2019 Follow up Recommendations Skilled Nursing facility   [facility] IP FREQUENCY AND DURATION 10/18/2019 Speech Therapy Frequency (ACUTE ONLY) min 2x/week Treatment Duration 2 weeks      CHL IP ORAL PHASE 10/18/2019 Oral Phase Impaired Oral - Pudding Teaspoon -- Oral - Pudding Cup -- Oral - Honey Teaspoon -- Oral - Honey Cup Reduced  posterior propulsion;Decreased bolus cohesion Oral - Nectar Teaspoon -- Oral - Nectar Cup Decreased bolus cohesion;Reduced posterior propulsion Oral - Nectar Straw -- Oral - Thin Teaspoon -- Oral - Thin Cup -- Oral - Thin Straw -- Oral - Puree Reduced posterior propulsion Oral - Mech Soft -- Oral - Regular Reduced posterior propulsion Oral - Multi-Consistency -- Oral - Pill -- Oral Phase - Comment --  CHL IP PHARYNGEAL PHASE 10/18/2019 Pharyngeal Phase Impaired Pharyngeal- Pudding Teaspoon -- Pharyngeal -- Pharyngeal- Pudding Cup -- Pharyngeal -- Pharyngeal- Honey Teaspoon -- Pharyngeal -- Pharyngeal- Honey Cup Delayed swallow initiation-vallecula Pharyngeal -- Pharyngeal- Nectar Teaspoon -- Pharyngeal -- Pharyngeal- Nectar Cup Penetration/Aspiration during swallow Pharyngeal Material enters airway, passes BELOW cords and not ejected out despite cough attempt by patient Pharyngeal- Nectar Straw -- Pharyngeal -- Pharyngeal- Thin Teaspoon -- Pharyngeal -- Pharyngeal- Thin Cup -- Pharyngeal -- Pharyngeal- Thin Straw -- Pharyngeal -- Pharyngeal- Puree WFL Pharyngeal -- Pharyngeal- Mechanical Soft -- Pharyngeal -- Pharyngeal- Regular WFL Pharyngeal -- Pharyngeal- Multi-consistency -- Pharyngeal -- Pharyngeal- Pill -- Pharyngeal -- Pharyngeal Comment --  CHL IP CERVICAL ESOPHAGEAL PHASE 10/18/2019 Cervical Esophageal Phase WFL Pudding Teaspoon -- Pudding Cup -- Honey Teaspoon -- Honey Cup -- Nectar Teaspoon -- Nectar Cup -- Nectar Straw -- Thin Teaspoon -- Thin Cup -- Thin Straw -- Puree -- Mechanical Soft -- Regular -- Multi-consistency -- Pill -- Cervical Esophageal Comment -- Houston Siren 10/18/2019, 2:08 PM  Orbie Pyo Litaker M.Ed Actor Pager [phone] Office [phone]             ECHOCARDIOGRAM COMPLETE  Result Date: 10/05/2019   ECHOCARDIOGRAM REPORT   Patient Name:   Debra Mcconnell          Date of Exam: 10/05/2019 Medical Rec #:  [id number]              Height:       68.0 in  Accession #:    [id number]             Weight:       200.0 lb Date of Birth:  Aug 01, 1946             BSA:          2.04 m Patient Age:    75 years               BP:           116/57 mmHg Patient Gender: F                      HR:           46 bpm. Exam Location:  Inpatient [facility] Procedure: 2D Echo Indications:    Cardiogenic Shock  History:        Patient has no prior history of Echocardiogram examinations.                 Covid-19 Positive; Risk Factors:Diabetes, Dyslipidemia and                 Hypertension.  Sonographer:  Mikki Santee RDCS (AE) Referring Phys: [id number] [place]  1. Left ventricular ejection fraction, by visual estimation, is 25 to 30%. The left ventricle has severely decreased function. There is no left ventricular hypertrophy.  2. Left ventricular diastolic parameters are indeterminate.  3. Moderately dilated left ventricular internal cavity size.  4. The left ventricle demonstrates global hypokinesis.  5. Global right ventricle has severely reduced systolic function.The right ventricular size is moderately enlarged. Right vetricular wall thickness was not assessed.  6. Left atrial size was severely dilated.  7. Right atrial size was moderately dilated.  8. Moderate pleural effusion in both left and right lateral regions.  9. Trivial pericardial effusion is present. 10. The mitral valve is normal in structure. Mild mitral valve regurgitation. 11. The tricuspid valve is normal in structure. 12. The tricuspid valve is normal in structure. Tricuspid valve regurgitation is trivial. 13. The aortic valve is tricuspid. Aortic valve regurgitation is not visualized. 14. The pulmonic valve was grossly normal. Pulmonic valve regurgitation is trivial. 15. TR signal is inadequate for assessing pulmonary artery systolic pressure. 16. The inferior vena cava is dilated in size with <50% respiratory variability, suggesting right atrial pressure of 15 mmHg. 17. Severely reduced  LVEF, with evidence of biventricular failure and elevated right atrial pressure. 18. There is no ECG tracing on this study, but patient appears to be in significant bradycardia throughout study. Based on imaging, may be flutter. Recommend clinical correlation. FINDINGS  Left Ventricle: Left ventricular ejection fraction, by visual estimation, is 25 to 30%. The left ventricle has severely decreased function. The left ventricle demonstrates global hypokinesis. The left ventricular internal cavity size was moderately dilated left ventricle. There is no left ventricular hypertrophy. Left ventricular diastolic parameters are indeterminate. Right Ventricle: The right ventricular size is moderately enlarged. Right vetricular wall thickness was not assessed. Global RV systolic function is has severely reduced systolic function. Left Atrium: Left atrial size was severely dilated. Right Atrium: Right atrial size was moderately dilated Pericardium: Trivial pericardial effusion is present. There is a moderate pleural effusion in both left and right lateral regions. Mitral Valve: The mitral valve is normal in structure. Mild mitral valve regurgitation. Tricuspid Valve: The tricuspid valve is normal in structure. Tricuspid valve regurgitation is trivial. Aortic Valve: The aortic valve is tricuspid. Aortic valve regurgitation is not visualized. There is mild calcification of the aortic valve. Pulmonic Valve: The pulmonic valve was grossly normal. Pulmonic valve regurgitation is trivial. Pulmonic regurgitation is trivial. Aorta: The aortic root and ascending aorta are structurally normal, with no evidence of dilitation. Venous: The inferior vena cava is dilated in size with less than 50% respiratory variability, suggesting right atrial pressure of 15 mmHg. IAS/Shunts: No atrial level shunt detected by color flow Doppler.  LEFT VENTRICLE PLAX 2D LVIDd:         5.50 cm  Diastology LVIDs:         4.69 cm  LV e' lateral: 7.31 cm/s LV  PW:         0.93 cm  LV e' medial:  3.88 cm/s LV IVS:        0.76 cm LVOT diam:     2.20 cm LV SV:         46 ml LV SV Index:   21.58 LVOT Area:     3.80 cm  RIGHT VENTRICLE RV S prime:     8.43 cm/s TAPSE (M-mode): 1.1 cm LEFT ATRIUM  Index       RIGHT ATRIUM           Index LA diam:        3.90 cm  1.91 cm/m  RA Area:     23.80 cm LA Vol (A2C):   154.0 ml 75.36 ml/m RA Volume:   78.80 ml  38.56 ml/m LA Vol (A4C):   153.0 ml 74.87 ml/m LA Biplane Vol: 154.0 ml 75.36 ml/m   AORTA Ao Root diam: 2.80 cm MR Peak grad: 55.7 mmHg MR Vmax:      373.00 cm/s SHUNTS                           Systemic Diam: 2.20 cm  Buford Dresser MD Electronically signed by Buford Dresser MD Signature Date/Time: 10/05/2019/12:59:43 PM    Final    ECHOCARDIOGRAM LIMITED  Result Date: 10/06/2019   ECHOCARDIOGRAM LIMITED REPORT   Patient Name:   Debra Mcconnell Date of Exam: 10/06/2019 Medical Rec #:  [id number]     Height:       68.0 in Accession #:    [id number]    Weight:       230.4 lb Date of Birth:  [birth year]    BSA:          2.17 m Patient Age:    71 years      BP:           99/60 mmHg Patient Gender: F             HR:           64 bpm. Exam Location:  Inpatient  Procedure: Limited Echo, Cardiac Doppler, Color Doppler and Intracardiac            Opacification Agent Indications:    ; I50.40* Unspecified combined systolic (congestive) and                 diastolic (congestive) heart failure. Limited echo to rule out                 LV thrombus.  History:        Patient has prior history of Echocardiogram examinations, most                 recent 10/05/2019. Covid 19 positive. Cardiac shock. Pneumonia.  Sonographer:    Roseanna Rainbow RDCS Referring Phys: [id number] Darreld Mclean  Sonographer Comments: Patient is morbidly obese. IMPRESSIONS  1. Left ventricular ejection fraction, by visual estimation, is 30 to 35%. The left ventricle has moderately decreased function. Left ventricular septal wall thickness was  moderately increased. Moderately increased left ventricular posterior wall thickness. There is moderately increased left ventricular wall thickness.  2. Moderate hypokinesis of the left ventricular, entire inferoseptal wall and inferior wall.  3. The left ventricle demonstrates regional wall motion abnormalities.  4. Global right ventricle has normal systolc function.The right ventricular size is normal. no increase in right ventricular wall thickness.  5. The mitral valve is normal in structure. No evidence of mitral valve regurgitation. No evidence of mitral stenosis.  6. The tricuspid valve was normal in structure. Tricuspid valve regurgitation is trivial.  7. Tricuspid valve regurgitation is trivial.  8. No evidence of aortic valve sclerosis or stenosis.  9. The pulmonic valve was normal in structure. 10. The inferior vena cava is dilated in size with <50% respiratory variability, suggesting right atrial pressure of 15 mmHg. FINDINGS  Left  Ventricle: Left ventricular ejection fraction, by visual estimation, is 30 to 35%. The left ventricle has moderately decreased function. Moderate hypokinesis of the left ventricular, entire inferoseptal wall and inferior wall. The left ventricle demonstrates regional wall motion abnormalities. Left ventricular septal wall thickness was moderately increased. Moderately increased left ventricular posterior wall thickness. There is moderately increased left ventricular wall thickness. Normal left atrial pressure. Right Ventricle: The right ventricular size is normal. No increase in right ventricular wall thickness. Global RV systolic function is has normal systolic function. Left Atrium: Left atrial size was normal in size. Right Atrium: Right atrial size was normal in size. Right atrial pressure is estimated at 15 mmHg. Pericardium: There is no evidence of pericardial effusion is seen. There is no evidence of pericardial effusion. There is pleural effusion in the left lateral  region. Mitral Valve: The mitral valve is normal in structure. No evidence of mitral valve stenosis by observation. No evidence of mitral valve regurgitation. Tricuspid Valve: The tricuspid valve is normal in structure. Tricuspid valve regurgitation is trivial. Aortic Valve: The aortic valve is tricuspid. . There is mild thickening and mild calcification of the aortic valve. Aortic valve regurgitation is not visualized. The aortic valve is structurally normal, with no evidence of sclerosis or stenosis. There is  mild thickening of the aortic valve. There is mild calcification of the aortic valve. Pulmonic Valve: The pulmonic valve was normal in structure. Pulmonic valve regurgitation is not visualized by color flow Doppler. Pulmonic regurgitation is not visualized by color flow Doppler. Aorta: The aortic root, ascending aorta and aortic arch are all structurally normal, with no evidence of dilitation or obstruction. Venous: The inferior vena cava is dilated in size with less than 50% respiratory variability, suggesting right atrial pressure of 15 mmHg. Shunts: There is no evidence of a patent foramen ovale. No ventricular septal defect is seen or detected. There is no evidence of an atrial septal defect. No atrial level shunt detected by color flow Doppler.  LEFT VENTRICLE          Normals PLAX 2D LVIDd:         5.04 cm  3.6 cm LVIDs:         3.80 cm  1.7 cm LV PW:         1.53 cm  1.4 cm LV IVS:        1.38 cm  1.3 cm LVOT diam:     2.20 cm  2.0 cm LV SV:         59 ml    79 ml LV SV Index:   25.67    45 ml/m2 LVOT Area:     3.80 cm 3.14 cm2  LV Volumes (MOD)             Normals LV area d, A2C:    32.30 cm LV area d, A4C:    32.50 cm LV area s, A2C:    28.80 cm LV area s, A4C:    28.40 cm LV major d, A2C:   7.75 cm LV major d, A4C:   6.80 cm LV major s, A2C:   7.74 cm LV major s, A4C:   6.50 cm LV vol d, MOD A2C: 110.0 ml  68 ml LV vol d, MOD A4C: 126.0 ml LV vol s, MOD A2C: 89.5 ml   24 ml LV vol s, MOD A4C:  98.7 ml LV SV MOD A2C:     20.5 ml LV SV MOD A4C:  126.0 ml LV SV MOD BP:      23.4 ml   45 ml LEFT ATRIUM         Index LA diam:    5.40 cm 2.49 cm/m   AORTA                 Normals Ao Root diam: 2.80 cm 31 mm  SHUNTS Systemic Diam: 2.20 cm  Skeet Latch MD Electronically signed by Skeet Latch MD Signature Date/Time: 10/06/2019/4:37:24 PMThe mitral valve is normal in structure.    Final        Subjective: Patient is feeling better, mentation continue to improve, dyspnea improved, continue to be very weak and deconditioned but mobility improving.   Discharge Exam: Vitals:   10/20/19 0421 10/20/19 0843  BP: 137/75 136/83  Pulse: 73   Resp: 18   Temp: 98.4 F (36.9 C)   SpO2: 98%    Vitals:   10/20/19 0126 10/20/19 0421 10/20/19 0449 10/20/19 0843  BP: 131/77 137/75  136/83  Pulse: 66 73    Resp: 17 18    Temp: 98.5 F (36.9 C) 98.4 F (36.9 C)    TempSrc: Axillary Axillary    SpO2: 98% 98%    Weight:   100.8 kg   Height:        General: Not in pain or dyspnea.  Neurology: Awake and alert, non focal  E ENT: no pallor, no icterus, oral mucosa moist Cardiovascular: No JVD. S1-S2 present, rhythmic, no gallops, rubs, or murmurs. Trace non pitting lower extremity edema. Pulmonary: positive breath sounds bilaterally, adequate air movement, no wheezing, rhonchi or rales. Gastrointestinal. Abdomen with no organomegaly, non tender, no rebound or guarding Skin. No rashes Musculoskeletal: no joint deformities   The results of significant diagnostics from this hospitalization (including imaging, microbiology, ancillary and laboratory) are listed below for reference.     Microbiology: No results found for this or any previous visit (from the past 240 hour(s)).   Labs: BNP (last 3 results) Recent Labs    10/04/19 2107 10/13/19 0435 10/18/19 0347  BNP 2,040.2* 471.5* [id number]*   Basic Metabolic Panel: Recent Labs  Lab 10/15/19 0020 10/15/19 0020 10/15/19 0253  10/15/19 0253 10/16/19 0332 10/17/19 0413 10/18/19 0347 10/19/19 0345 10/20/19 0250  NA 141   < > 143   < > 140 139 139 139 141  K 4.4   < > 4.0   < > 4.6 4.5 4.3 3.9 3.6  CL 103   < > 101   < > 102 103 103 102 105  CO2 29   < > 27   < > 28 27 28 26 27   GLUCOSE 81   < > 88   < > 91 72 120* 118* 111*  BUN 78*   < > 78*   < > 72* 61* 57* 58* 59*  CREATININE 0.96   < > 0.98   < > 0.90 0.89 0.96 0.97 0.91  CALCIUM 8.3*   < > 8.0*   < > 8.1* 8.0* 8.1* 8.2* 8.5*  MG 2.1  --  2.2  --  2.3 2.2 2.1  --   --    < > = values in this interval not displayed.   Liver Function Tests: No results for input(s): AST, ALT, ALKPHOS, BILITOT, PROT, ALBUMIN in the last 168 hours. No results for input(s): LIPASE, AMYLASE in the last 168 hours. No results for input(s): AMMONIA in the last 168 hours. CBC: Recent Labs  Lab 10/15/19 0253 10/15/19 0253 10/16/19 0332 10/16/19 0840 10/17/19 0413 10/18/19 0347 10/19/19 0345  WBC 9.6  --  9.6  --  9.3 8.9 6.6  HGB 13.2  --  12.4  --  12.6 12.2 11.8*  HCT 41.4   < > 39.4 38.6 39.3 38.9 37.9  MCV 101.2*  --  101.5*  --  101.0* 100.8* 102.2*  PLT 173  --  160  --  154 142* 142*   < > = values in this interval not displayed.   Cardiac Enzymes: No results for input(s): CKTOTAL, CKMB, CKMBINDEX, TROPONINI in the last 168 hours. BNP: Invalid input(s): POCBNP CBG: Recent Labs  Lab 10/19/19 1943 10/19/19 1957 10/20/19 0026 10/20/19 0428 10/20/19 0719  GLUCAP 46* 92 145* 99 96   D-Dimer No results for input(s): DDIMER in the last 72 hours. Hgb A1c No results for input(s): HGBA1C in the last 72 hours. Lipid Profile No results for input(s): CHOL, HDL, LDLCALC, TRIG, CHOLHDL, LDLDIRECT in the last 72 hours. Thyroid function studies No results for input(s): TSH, T4TOTAL, T3FREE, THYROIDAB in the last 72 hours.  Invalid input(s): FREET3 Anemia work up No results for input(s): VITAMINB12, FOLATE, FERRITIN, TIBC, IRON, RETICCTPCT in the last 72  hours. Urinalysis    Component Value Date/Time   COLORURINE AMBER (A) 10/05/2019 0130   APPEARANCEUR CLEAR 10/05/2019 0130   LABSPEC 1.033 (H) 10/05/2019 0130   PHURINE 5.0 10/05/2019 0130   GLUCOSEU NEGATIVE 10/05/2019 0130   HGBUR SMALL (A) 10/05/2019 0130   BILIRUBINUR NEGATIVE 10/05/2019 0130   KETONESUR NEGATIVE 10/05/2019 0130   PROTEINUR 100 (A) 10/05/2019 0130   UROBILINOGEN 0.2 06/24/2007 1150   NITRITE NEGATIVE 10/05/2019 0130   LEUKOCYTESUR NEGATIVE 10/05/2019 0130   Sepsis Labs Invalid input(s): PROCALCITONIN,  WBC,  LACTICIDVEN Microbiology No results found for this or any previous visit (from the past 240 hour(s)).   Time coordinating discharge: 45 minutes  SIGNED:   Tawni Millers, MD  [facility] 10/20/2019, 10:40 AM

## 2019-10-20 NOTE — Progress Notes (Signed)
Called over to [facility] and gave report to [employer]. Pt belonging of glasses and hearing aid was send with her.

## 2019-10-20 NOTE — Care Management Important Message (Signed)
Important Message  Patient Details  Name: Debra Mcconnell MRN: [id number] Date of Birth: 1946/04/03   Medicare Important Message Given:  Yes - Important Message mailed due to current National Emergency  Verbal consent obtained due to current National Emergency  Relationship to patient: Other (must comment)(patients POA) Contact Name: Elwin Sleight Call Date: 10/20/19  Time: 1139 Phone: [phone] Outcome: Spoke with contact Important Message mailed to: Patient address on file    [name] 10/20/2019, 11:40 AM

## 2019-10-20 NOTE — TOC Progression Note (Addendum)
Transition of Care [facility]) - Progression Note    Patient Details  Name: DEMELZA COCKRILL MRN: [id number] Date of Birth: [birth year]  Transition of Care [facility]) CM/SW Contact  Leeroy Cha, RN Phone Number: 10/20/2019, 9:28 AM  Clinical Narrative:    tct-kenisha closs/ message left that patient will be ready on 021221/to please return call. TCF-Kenisha Closs/have beds today and tomorrow md alerted of this.   TCT-Kenisha -md wants to send today. TCF-Keniia Rema Jasmine can come today.  Floor rn and md notified.   Will go via ptar Spoke with poa is aware that patient will ber goiong to [facility] today questions answered. tct-ptar for transport to [facility]. Med necessity printed out to floor for RN aned RN aware of transfer. Expected Discharge Plan: [place] Barriers to Discharge: Continued Medical Work up  Expected Discharge Plan and Services Expected Discharge Plan: [place] Choice: [place] arrangements for the past 2 months: Single Family Home                                       Social Determinants of Health (SDOH) Interventions    Readmission Risk Interventions No flowsheet data found.

## 2019-10-21 DIAGNOSIS — I509 Heart failure, unspecified: Secondary | ICD-10-CM | POA: Diagnosis not present

## 2019-10-21 DIAGNOSIS — J1282 Pneumonia due to coronavirus disease 2019: Secondary | ICD-10-CM | POA: Diagnosis not present

## 2019-10-21 DIAGNOSIS — J9601 Acute respiratory failure with hypoxia: Secondary | ICD-10-CM | POA: Diagnosis not present

## 2019-10-21 DIAGNOSIS — U071 COVID-19: Secondary | ICD-10-CM | POA: Diagnosis not present

## 2019-10-28 DIAGNOSIS — E118 Type 2 diabetes mellitus with unspecified complications: Secondary | ICD-10-CM | POA: Diagnosis not present

## 2019-10-28 DIAGNOSIS — U071 COVID-19: Secondary | ICD-10-CM | POA: Diagnosis not present

## 2019-10-28 DIAGNOSIS — L89300 Pressure ulcer of unspecified buttock, unstageable: Secondary | ICD-10-CM | POA: Diagnosis not present

## 2019-10-28 DIAGNOSIS — F39 Unspecified mood [affective] disorder: Secondary | ICD-10-CM | POA: Diagnosis not present

## 2019-11-01 DIAGNOSIS — U071 COVID-19: Secondary | ICD-10-CM | POA: Diagnosis not present

## 2019-11-01 DIAGNOSIS — J9601 Acute respiratory failure with hypoxia: Secondary | ICD-10-CM | POA: Diagnosis not present

## 2019-11-01 DIAGNOSIS — I509 Heart failure, unspecified: Secondary | ICD-10-CM | POA: Diagnosis not present

## 2019-11-01 DIAGNOSIS — L0390 Cellulitis, unspecified: Secondary | ICD-10-CM | POA: Diagnosis not present

## 2019-11-03 DIAGNOSIS — L89300 Pressure ulcer of unspecified buttock, unstageable: Secondary | ICD-10-CM | POA: Diagnosis not present

## 2019-11-03 DIAGNOSIS — S50822A Blister (nonthermal) of left forearm, initial encounter: Secondary | ICD-10-CM | POA: Diagnosis not present

## 2019-11-03 NOTE — Progress Notes (Signed)
11/03/2019- contacted [name] Markwood in reference to patient's items left in safe. She stated that she would be by today (11/03/2019) to pick up.

## 2019-11-08 DIAGNOSIS — D6489 Other specified anemias: Secondary | ICD-10-CM | POA: Diagnosis not present

## 2019-11-08 DIAGNOSIS — J1282 Pneumonia due to coronavirus disease 2019: Secondary | ICD-10-CM | POA: Diagnosis not present

## 2019-11-08 DIAGNOSIS — R5381 Other malaise: Secondary | ICD-10-CM | POA: Diagnosis not present

## 2019-11-08 DIAGNOSIS — I480 Paroxysmal atrial fibrillation: Secondary | ICD-10-CM | POA: Diagnosis not present

## 2019-11-08 DIAGNOSIS — L89300 Pressure ulcer of unspecified buttock, unstageable: Secondary | ICD-10-CM | POA: Diagnosis not present

## 2019-11-08 DIAGNOSIS — E119 Type 2 diabetes mellitus without complications: Secondary | ICD-10-CM | POA: Diagnosis not present

## 2019-11-08 DIAGNOSIS — I5189 Other ill-defined heart diseases: Secondary | ICD-10-CM | POA: Diagnosis not present

## 2019-11-08 DIAGNOSIS — U071 COVID-19: Secondary | ICD-10-CM | POA: Diagnosis not present

## 2019-11-08 DIAGNOSIS — E8809 Other disorders of plasma-protein metabolism, not elsewhere classified: Secondary | ICD-10-CM | POA: Diagnosis not present

## 2019-11-08 DIAGNOSIS — E7849 Other hyperlipidemia: Secondary | ICD-10-CM | POA: Diagnosis not present

## 2019-11-10 ENCOUNTER — Other Ambulatory Visit: Payer: Self-pay | Admitting: *Deleted

## 2019-11-10 NOTE — Patient Outreach (Signed)
Screened for potential [facility] Care Management needs as a benefit of  [employer].  Member is currently receiving skilled therapy at [facility].   Writer attended telephonic interdisciplinary team meeting to assess for disposition needs and transition plan for resident.   Facility reports member will need 24/7 upon SNF. Recommendations are for long term care. Facility states member's HCPOA wants member to return home at SNF dc. Further discussions needed with HCPOA regarding transition plans.   Will plan outreach to [facility] to discuss further.   Will continue to follow while member resides in SNF.    Marthenia Rolling, MSN-Ed, RN,BSN [place] Acute Care Coordinator [phone] [facility]) [phone]  (Toll free office)

## 2019-11-11 ENCOUNTER — Other Ambulatory Visit: Payer: Self-pay | Admitting: *Deleted

## 2019-11-11 NOTE — Patient Outreach (Signed)
THN Post- Acute Care Coordinator follow up.  Member remains at [facility] receiving skilled therapy.   Telephone call made to [facility] [phone]. No answer. HIPAA compliant voicemail message requesting return call. Attempted to call [phone]. Non- working number.   Will attempt outreach again at later time.     Marthenia Rolling, MSN-Ed, RN,BSN [place] Acute Care Coordinator [phone] [facility]) [phone]  (Toll free office)

## 2019-11-14 ENCOUNTER — Other Ambulatory Visit: Payer: Self-pay | Admitting: *Deleted

## 2019-11-14 DIAGNOSIS — I10 Essential (primary) hypertension: Secondary | ICD-10-CM | POA: Diagnosis not present

## 2019-11-14 DIAGNOSIS — G9341 Metabolic encephalopathy: Secondary | ICD-10-CM | POA: Diagnosis not present

## 2019-11-14 DIAGNOSIS — U071 COVID-19: Secondary | ICD-10-CM | POA: Diagnosis not present

## 2019-11-14 DIAGNOSIS — G4733 Obstructive sleep apnea (adult) (pediatric): Secondary | ICD-10-CM | POA: Diagnosis not present

## 2019-11-14 NOTE — Patient Outreach (Signed)
THN Post Acute Care Coordinator follow up  Ms. Menke is currently receiving skilled therapy at [facility].   Telephone call made to [facility] [phone].  Patient identifiers confirmed.   [name] states Ms. Couillard sounds very "clear and with it" during their daily phone calls with each other. [name] states member wants to return home at SNF dc. States member is not aware of the recommendations for long term care. [name] is also aware of member's bedsore.   [name] states she still has a lot of questions. She is looking forward to another meeting with the facility staff. Encouraged [name] to call to request meeting.   Discussed that member lived alone prior. Inquired whether paid care giver assistance would be an option. [name] states member's brother handles the finances. Encouraged [name] to loop member's brother in on the care plan meeting so he can hear the same thing regarding the recommendations for 24/7 assistance needed.   [name] states member does not have Medicaid.   Discussed recommendations for 24/7 will not likely change. Encouraged [name] to consider options and to discuss with member and brother and facility. Also discussed that if member continues to be adamant about returning home, that hospice or palliative care should be considered as well due to decline.   [name] expressed appreciation of writer's call. She had to get off phone for dentist appointment. Provided writer's contact.   Message sent to [facility] Social Worker to inquire about scheduled care plan meeting and to inform that HCPOA has more questions.   Will continue to follow.   Marthenia Rolling, MSN-Ed, RN,BSN [place] Acute Care Coordinator [phone] [facility]) [phone]  (Toll free office)

## 2019-11-17 ENCOUNTER — Other Ambulatory Visit: Payer: Self-pay | Admitting: *Deleted

## 2019-11-17 NOTE — Patient Outreach (Addendum)
Screened for potential [facility] Care Management needs as a benefit of  [employer].  Member is currently receiving skilled therapy at [facility].   Writer attended telephonic interdisciplinary team meeting to assess for disposition needs and transition plan for resident.   Facility reports member is making progress and gains with therapy. Requires daily dressing changes. Wants to return home at SNF dc. Cognition improving as well. Currently requiring daily wound dressing changes. Member from home alone. Facility continues to recommend LTC. Facility to schedule care plan meeting with friend/HCPOA.   Will continue to follow for transition plans and for potential [facility] Care Management needs.    Marthenia Rolling, MSN-Ed, RN,BSN [place] Acute Care Coordinator [phone] [facility]) [phone]  (Toll free office)

## 2019-11-23 DIAGNOSIS — E119 Type 2 diabetes mellitus without complications: Secondary | ICD-10-CM | POA: Diagnosis not present

## 2019-11-23 DIAGNOSIS — S41101A Unspecified open wound of right upper arm, initial encounter: Secondary | ICD-10-CM | POA: Diagnosis not present

## 2019-11-24 ENCOUNTER — Other Ambulatory Visit: Payer: Self-pay | Admitting: *Deleted

## 2019-11-24 NOTE — Patient Outreach (Signed)
THN Post Acute Care Coordinator follow up  Update received from [facility]. Mrs. Lucci remains at [facility] receiving skilled therapy and wound care.   Telephone call made to [facility] [phone] to discuss transition plans. No answer. HIPAA compliant voicemail message left requesting return call.  Marthenia Rolling, MSN-Ed, RN,BSN [place] Acute Care Coordinator [phone] [facility]) [phone]  (Toll free office)

## 2019-11-28 ENCOUNTER — Other Ambulatory Visit: Payer: Self-pay | Admitting: *Deleted

## 2019-11-28 DIAGNOSIS — I10 Essential (primary) hypertension: Secondary | ICD-10-CM | POA: Diagnosis not present

## 2019-11-28 DIAGNOSIS — G9341 Metabolic encephalopathy: Secondary | ICD-10-CM | POA: Diagnosis not present

## 2019-11-28 DIAGNOSIS — R195 Other fecal abnormalities: Secondary | ICD-10-CM | POA: Diagnosis not present

## 2019-11-28 NOTE — Patient Outreach (Signed)
THN Post- Acute Care Coordinator follow up  Mrs. Lotspeich remains at [facility].  Telephone call made to friend/HCPOA Elwin Sleight  [phone] to follow up on transition plans and potential [facility] needs. No answer. HIPAA compliant voicemail message left requesting return call.  Will continue to follow and plan outreach again as appropriate.   Marthenia Rolling, MSN-Ed, RN,BSN [place] Acute Care Coordinator [phone] [facility]) [phone]  (Toll free office)

## 2019-11-29 ENCOUNTER — Telehealth: Payer: Self-pay | Admitting: Sports Medicine

## 2019-11-29 NOTE — Telephone Encounter (Signed)
pts friend Grace Blight called and left message yesterday checking on status of pts shoes that were ordered end of last year.   I returned call and explained shoesd were on back order and should be shipping next week. I told her I would call when they come in and she said to call her so she could pick them up. I explained that she is not able to pick them up, the patient has to. She was going to discuss with pt and power of attorney.

## 2019-12-01 DIAGNOSIS — S41101A Unspecified open wound of right upper arm, initial encounter: Secondary | ICD-10-CM | POA: Diagnosis not present

## 2019-12-01 DIAGNOSIS — R300 Dysuria: Secondary | ICD-10-CM | POA: Diagnosis not present

## 2019-12-01 DIAGNOSIS — L89150 Pressure ulcer of sacral region, unstageable: Secondary | ICD-10-CM | POA: Diagnosis not present

## 2019-12-02 ENCOUNTER — Ambulatory Visit: Payer: Medicare Other | Admitting: Sports Medicine

## 2019-12-02 DIAGNOSIS — N289 Disorder of kidney and ureter, unspecified: Secondary | ICD-10-CM | POA: Diagnosis not present

## 2019-12-02 DIAGNOSIS — K5901 Slow transit constipation: Secondary | ICD-10-CM | POA: Diagnosis not present

## 2019-12-02 DIAGNOSIS — E0841 Diabetes mellitus due to underlying condition with diabetic mononeuropathy: Secondary | ICD-10-CM | POA: Diagnosis not present

## 2019-12-02 DIAGNOSIS — L89150 Pressure ulcer of sacral region, unstageable: Secondary | ICD-10-CM | POA: Diagnosis not present

## 2019-12-07 ENCOUNTER — Other Ambulatory Visit: Payer: Self-pay | Admitting: *Deleted

## 2019-12-07 NOTE — Patient Outreach (Signed)
THN Post- Acute Care Coordinator follow up  Ms. Lyden is currently receiving skilled therapy at [facility].   Telephone call made to [facility] mobile number on file [phone]. Number was actually her friend's number. Member's mobile number is [phone]. Attempted to reach Ms. Sipp at [phone]. No answer and unable to leave voicemail message.   Telephone call made again to [facility] [phone]. No answer. Left HIPAA compliant voicemail message.   Note Probation officer has made numerous unsuccessful outreach attempts to [employer] to discuss transition plans and [place] [employer] services.  Made facility SW aware.  Will continue to follow.  Marthenia Rolling, MSN-Ed, RN,BSN [place] Acute Care Coordinator [phone] [facility]) [phone]  (Toll free office)

## 2019-12-07 NOTE — Patient Outreach (Signed)
Entry error

## 2019-12-08 DIAGNOSIS — R195 Other fecal abnormalities: Secondary | ICD-10-CM | POA: Diagnosis not present

## 2019-12-08 DIAGNOSIS — N390 Urinary tract infection, site not specified: Secondary | ICD-10-CM | POA: Diagnosis not present

## 2019-12-08 DIAGNOSIS — R5381 Other malaise: Secondary | ICD-10-CM | POA: Diagnosis not present

## 2019-12-13 DIAGNOSIS — G4733 Obstructive sleep apnea (adult) (pediatric): Secondary | ICD-10-CM | POA: Diagnosis not present

## 2019-12-13 DIAGNOSIS — E7849 Other hyperlipidemia: Secondary | ICD-10-CM | POA: Diagnosis not present

## 2019-12-13 DIAGNOSIS — E1169 Type 2 diabetes mellitus with other specified complication: Secondary | ICD-10-CM | POA: Diagnosis not present

## 2019-12-13 DIAGNOSIS — I480 Paroxysmal atrial fibrillation: Secondary | ICD-10-CM | POA: Diagnosis not present

## 2019-12-13 DIAGNOSIS — I5021 Acute systolic (congestive) heart failure: Secondary | ICD-10-CM | POA: Diagnosis not present

## 2019-12-13 DIAGNOSIS — I748 Embolism and thrombosis of other arteries: Secondary | ICD-10-CM | POA: Diagnosis not present

## 2019-12-13 DIAGNOSIS — R262 Difficulty in walking, not elsewhere classified: Secondary | ICD-10-CM | POA: Diagnosis not present

## 2019-12-13 DIAGNOSIS — I514 Myocarditis, unspecified: Secondary | ICD-10-CM | POA: Diagnosis not present

## 2019-12-13 DIAGNOSIS — Z8616 Personal history of COVID-19: Secondary | ICD-10-CM | POA: Diagnosis not present

## 2019-12-13 DIAGNOSIS — Z8673 Personal history of transient ischemic attack (TIA), and cerebral infarction without residual deficits: Secondary | ICD-10-CM | POA: Diagnosis not present

## 2019-12-13 DIAGNOSIS — E038 Other specified hypothyroidism: Secondary | ICD-10-CM | POA: Diagnosis not present

## 2019-12-13 DIAGNOSIS — E668 Other obesity: Secondary | ICD-10-CM | POA: Diagnosis not present

## 2019-12-14 DIAGNOSIS — L259 Unspecified contact dermatitis, unspecified cause: Secondary | ICD-10-CM | POA: Diagnosis not present

## 2019-12-14 DIAGNOSIS — I5021 Acute systolic (congestive) heart failure: Secondary | ICD-10-CM | POA: Diagnosis not present

## 2019-12-14 DIAGNOSIS — R600 Localized edema: Secondary | ICD-10-CM | POA: Diagnosis not present

## 2019-12-14 DIAGNOSIS — I10 Essential (primary) hypertension: Secondary | ICD-10-CM | POA: Diagnosis not present

## 2019-12-29 DIAGNOSIS — L89154 Pressure ulcer of sacral region, stage 4: Secondary | ICD-10-CM | POA: Diagnosis not present

## 2020-01-03 ENCOUNTER — Other Ambulatory Visit: Payer: Self-pay | Admitting: *Deleted

## 2020-01-03 DIAGNOSIS — I480 Paroxysmal atrial fibrillation: Secondary | ICD-10-CM | POA: Diagnosis not present

## 2020-01-03 DIAGNOSIS — L89154 Pressure ulcer of sacral region, stage 4: Secondary | ICD-10-CM | POA: Diagnosis not present

## 2020-01-03 DIAGNOSIS — E785 Hyperlipidemia, unspecified: Secondary | ICD-10-CM | POA: Diagnosis not present

## 2020-01-03 DIAGNOSIS — J9601 Acute respiratory failure with hypoxia: Secondary | ICD-10-CM | POA: Diagnosis not present

## 2020-01-03 DIAGNOSIS — Z8673 Personal history of transient ischemic attack (TIA), and cerebral infarction without residual deficits: Secondary | ICD-10-CM | POA: Diagnosis not present

## 2020-01-03 DIAGNOSIS — I5189 Other ill-defined heart diseases: Secondary | ICD-10-CM | POA: Diagnosis not present

## 2020-01-03 DIAGNOSIS — E038 Other specified hypothyroidism: Secondary | ICD-10-CM | POA: Diagnosis not present

## 2020-01-03 DIAGNOSIS — U071 COVID-19: Secondary | ICD-10-CM | POA: Diagnosis not present

## 2020-01-03 DIAGNOSIS — I10 Essential (primary) hypertension: Secondary | ICD-10-CM | POA: Diagnosis not present

## 2020-01-03 NOTE — Patient Outreach (Addendum)
THN Post- Acute Care Coordinator follow up.  Received notification from [facility] Social Worker indicating transition plans are for home with home health on this Thursday, April 29th. States member will have 24/7 for teaching and wound care training. States a ramp has also been built. Not sure which home health agency yet. Will Biochemist, clinical know later.   Telephone call made to member at [phone]. Patient identifiers confirmed. Mrs. Kunda confirms she will return home on Thursday, April 29th. States she will have a couple stay with her 24/7 for a 2 weeks post SNF. Denies having any transportation or meal needs.   Explained [place] Management services. Ms. Kennel is agreeable. Explained [facility] Care Management will not interfere or replace home health. Discussed THN RNCM for complex case management and care coordination and THN LCSW for long term care planning. Also agreeable to write contacting her HCPOA [name] to discuss THN follow up as well.   Telephone call made to [facility] [phone]. Patient identifiers confirmed. Encouraged [name] to make PCP follow up upon SNF dc since Ms. Cubero has been in SNF for so long. [name] states member will have someone staying with her for a "couple of weeks." States "we will go from there." Explained to [name] that member has used a great portion of her SNF days and long term plans will need to be addressed. [name] states she understands. [name] also states they understand that home health will not be out daily for dressing changes. States they are aware and will be taught for dressing changes. Explained that Probation officer will make referrals for [facility] RNCM and THN LCSW. [name] endorses it okay to contact member first and to call her if unable to reach Ms. Runnion.  Confirmed telephone numbers for Ms. [place] are home [phone] and cell [phone]. Dr. Burnett Sheng is PCP. [facility]) mobile is [phone].   Will make referrals for [facility] RNCM and THN LCSW.  Will await to  hear back from facility SW regarding home health arrangements.   Ms. Ehrhard is slated for transition to home on Thursday, April 29th. She has a medical history of stage 4 coccyx wound, COVID- 54 in Feb. '21, HTN, paroxysmal atrial flutter, DM, obesity, CVA, HLD.  Addendum: member will have [facility] home health per facility SW. Also emailed [place] Management and writer contact information and 24-hr nurse advice line number and web site address again.     Marthenia Rolling, MSN-Ed, RN,BSN [place] Acute Care Coordinator [phone] [facility]) [phone]  (Toll free office)

## 2020-01-05 ENCOUNTER — Other Ambulatory Visit: Payer: Self-pay | Admitting: *Deleted

## 2020-01-05 NOTE — Patient Outreach (Signed)
[facility] [facility]) Care Management  01/05/2020  Debra Mcconnell [birth year] [id number]   CSW made initial contact with pt who confirmed her identity.  CSW introduced self, role and reason for call. Per pt, she is being released from SNF rehab on 01/06/2020. She has no current needs/concerns and indicates she has transportation, means for getting RX, groceries,etc.  CSW offered to call back next week after she is home and further assess needs.      Eduard Clos, MSW, [place] Worker  [place] [phone]

## 2020-01-06 ENCOUNTER — Other Ambulatory Visit: Payer: Self-pay

## 2020-01-06 DIAGNOSIS — I409 Acute myocarditis, unspecified: Secondary | ICD-10-CM | POA: Diagnosis not present

## 2020-01-06 DIAGNOSIS — E1140 Type 2 diabetes mellitus with diabetic neuropathy, unspecified: Secondary | ICD-10-CM | POA: Diagnosis not present

## 2020-01-06 DIAGNOSIS — Z4800 Encounter for change or removal of nonsurgical wound dressing: Secondary | ICD-10-CM | POA: Diagnosis not present

## 2020-01-06 DIAGNOSIS — G4733 Obstructive sleep apnea (adult) (pediatric): Secondary | ICD-10-CM | POA: Diagnosis not present

## 2020-01-06 DIAGNOSIS — I5082 Biventricular heart failure: Secondary | ICD-10-CM | POA: Diagnosis not present

## 2020-01-06 DIAGNOSIS — I110 Hypertensive heart disease with heart failure: Secondary | ICD-10-CM | POA: Diagnosis not present

## 2020-01-06 DIAGNOSIS — Z7901 Long term (current) use of anticoagulants: Secondary | ICD-10-CM | POA: Diagnosis not present

## 2020-01-06 DIAGNOSIS — M6281 Muscle weakness (generalized): Secondary | ICD-10-CM | POA: Diagnosis not present

## 2020-01-06 DIAGNOSIS — L89154 Pressure ulcer of sacral region, stage 4: Secondary | ICD-10-CM | POA: Diagnosis not present

## 2020-01-06 DIAGNOSIS — E11628 Type 2 diabetes mellitus with other skin complications: Secondary | ICD-10-CM | POA: Diagnosis not present

## 2020-01-06 DIAGNOSIS — R1312 Dysphagia, oropharyngeal phase: Secondary | ICD-10-CM | POA: Diagnosis not present

## 2020-01-06 DIAGNOSIS — R279 Unspecified lack of coordination: Secondary | ICD-10-CM | POA: Diagnosis not present

## 2020-01-06 DIAGNOSIS — E1139 Type 2 diabetes mellitus with other diabetic ophthalmic complication: Secondary | ICD-10-CM | POA: Diagnosis not present

## 2020-01-06 DIAGNOSIS — I480 Paroxysmal atrial fibrillation: Secondary | ICD-10-CM | POA: Diagnosis not present

## 2020-01-06 DIAGNOSIS — E039 Hypothyroidism, unspecified: Secondary | ICD-10-CM | POA: Diagnosis not present

## 2020-01-06 DIAGNOSIS — F419 Anxiety disorder, unspecified: Secondary | ICD-10-CM | POA: Diagnosis not present

## 2020-01-06 DIAGNOSIS — Z8673 Personal history of transient ischemic attack (TIA), and cerebral infarction without residual deficits: Secondary | ICD-10-CM | POA: Diagnosis not present

## 2020-01-06 DIAGNOSIS — U071 COVID-19: Secondary | ICD-10-CM | POA: Diagnosis not present

## 2020-01-06 DIAGNOSIS — E669 Obesity, unspecified: Secondary | ICD-10-CM | POA: Diagnosis not present

## 2020-01-06 DIAGNOSIS — R269 Unspecified abnormalities of gait and mobility: Secondary | ICD-10-CM | POA: Diagnosis not present

## 2020-01-06 NOTE — Patient Outreach (Signed)
[place] [facility]) Care Management  01/06/2020  LEATRICE KNACK 10-13-1945 [id number]   New referral for transition of care. Discharged from [facility] on 01/05/2020.  Recent Covid pneumonia, shock. Pressure ulcer, DM CVA  Placed call to patient with no answer at home or mobile phone.  PLAN: will mail outreach letter and attempt again in 3 days.  Tomasa Rand, RN, BSN, CEN [facility] [employer] [phone]

## 2020-01-09 ENCOUNTER — Other Ambulatory Visit: Payer: Self-pay | Admitting: *Deleted

## 2020-01-09 DIAGNOSIS — M6281 Muscle weakness (generalized): Secondary | ICD-10-CM | POA: Diagnosis not present

## 2020-01-09 DIAGNOSIS — E11628 Type 2 diabetes mellitus with other skin complications: Secondary | ICD-10-CM | POA: Diagnosis not present

## 2020-01-09 DIAGNOSIS — L89154 Pressure ulcer of sacral region, stage 4: Secondary | ICD-10-CM | POA: Diagnosis not present

## 2020-01-09 DIAGNOSIS — Z4800 Encounter for change or removal of nonsurgical wound dressing: Secondary | ICD-10-CM | POA: Diagnosis not present

## 2020-01-09 DIAGNOSIS — U071 COVID-19: Secondary | ICD-10-CM | POA: Diagnosis not present

## 2020-01-09 DIAGNOSIS — I409 Acute myocarditis, unspecified: Secondary | ICD-10-CM | POA: Diagnosis not present

## 2020-01-09 NOTE — Patient Outreach (Signed)
[facility]) Care Management  01/09/2020  Debra Mcconnell 1946/01/28 [id number]   CSW was able to make contact with pt today and confirmed pt identity. Per pt, she is home after being hospitalized and at SNF for about 3 months. "I had the virus (COVID) and was very sick". Pt lives alone, has some privately arranged care with her 24/7 for a few weeks. Pt has not heard from [facility] and is reminded to expect a call from them to set up initial visit. Pt reports she has all her needed meds, groceries, in home assistance and transportation.  Pt does not anticipate any needs from SW at this time- CSW has offered to call pt next week for a further assessment of possible needs. CSW provided contact # and encouraged pt to call if needs arise prior to next call.  Debra Mcconnell, MSW, [place] Worker  [place] [phone]

## 2020-01-10 ENCOUNTER — Other Ambulatory Visit: Payer: Self-pay | Admitting: Cardiology

## 2020-01-10 DIAGNOSIS — U071 COVID-19: Secondary | ICD-10-CM | POA: Diagnosis not present

## 2020-01-10 DIAGNOSIS — I409 Acute myocarditis, unspecified: Secondary | ICD-10-CM | POA: Diagnosis not present

## 2020-01-10 DIAGNOSIS — M6281 Muscle weakness (generalized): Secondary | ICD-10-CM | POA: Diagnosis not present

## 2020-01-10 DIAGNOSIS — E11628 Type 2 diabetes mellitus with other skin complications: Secondary | ICD-10-CM | POA: Diagnosis not present

## 2020-01-10 DIAGNOSIS — Z4800 Encounter for change or removal of nonsurgical wound dressing: Secondary | ICD-10-CM | POA: Diagnosis not present

## 2020-01-10 DIAGNOSIS — L89154 Pressure ulcer of sacral region, stage 4: Secondary | ICD-10-CM | POA: Diagnosis not present

## 2020-01-11 ENCOUNTER — Other Ambulatory Visit: Payer: Self-pay

## 2020-01-11 DIAGNOSIS — U071 COVID-19: Secondary | ICD-10-CM | POA: Diagnosis not present

## 2020-01-11 DIAGNOSIS — L89154 Pressure ulcer of sacral region, stage 4: Secondary | ICD-10-CM | POA: Diagnosis not present

## 2020-01-11 DIAGNOSIS — I409 Acute myocarditis, unspecified: Secondary | ICD-10-CM | POA: Diagnosis not present

## 2020-01-11 DIAGNOSIS — E11628 Type 2 diabetes mellitus with other skin complications: Secondary | ICD-10-CM | POA: Diagnosis not present

## 2020-01-11 DIAGNOSIS — M6281 Muscle weakness (generalized): Secondary | ICD-10-CM | POA: Diagnosis not present

## 2020-01-11 DIAGNOSIS — Z4800 Encounter for change or removal of nonsurgical wound dressing: Secondary | ICD-10-CM | POA: Diagnosis not present

## 2020-01-11 NOTE — Telephone Encounter (Signed)
LOV with Dr. Bettina Gavia was 10/12/2018

## 2020-01-11 NOTE — Patient Outreach (Signed)
[facility]) Care Management  01/11/2020  Debra Mcconnell March 23, 1946 [id number]   Telephone assessment:  Placed call to patient who answered and identified herself. Patient request I speak with Debra Mcconnell (friend).  Spoke with patient and Ms. Debra Mcconnell and explained [facility] program. Ms. Debra Mcconnell states, [facility] is active with patient. Patient is receiving nursing, PT and OT services. Ms. Debra Mcconnell states that they have hired 24/7 caregivers from [place].   Reports follow up with PCP on May 11th. Confirms transportation. Ms. Debra Mcconnell states that she manages all of patients medication and has for a long time. Patient and Ms. Debra Mcconnell states all needs are met at this times and denies any needed assistance. Agreed for me to close case and mail a letter for them to call me if needed in the future.   PLAN: Case closure as no needs identified. Will send case closure letter to patient and Ms. Debra Mcconnell will call me if needed in the future. Will send an in basket message to [place] worker for an update.  Debra Rand, RN, BSN, CEN [facility] [employer] [phone]

## 2020-01-13 DIAGNOSIS — I409 Acute myocarditis, unspecified: Secondary | ICD-10-CM | POA: Diagnosis not present

## 2020-01-13 DIAGNOSIS — Z4800 Encounter for change or removal of nonsurgical wound dressing: Secondary | ICD-10-CM | POA: Diagnosis not present

## 2020-01-13 DIAGNOSIS — E11628 Type 2 diabetes mellitus with other skin complications: Secondary | ICD-10-CM | POA: Diagnosis not present

## 2020-01-13 DIAGNOSIS — L89154 Pressure ulcer of sacral region, stage 4: Secondary | ICD-10-CM | POA: Diagnosis not present

## 2020-01-13 DIAGNOSIS — M6281 Muscle weakness (generalized): Secondary | ICD-10-CM | POA: Diagnosis not present

## 2020-01-13 DIAGNOSIS — U071 COVID-19: Secondary | ICD-10-CM | POA: Diagnosis not present

## 2020-01-16 DIAGNOSIS — E11628 Type 2 diabetes mellitus with other skin complications: Secondary | ICD-10-CM | POA: Diagnosis not present

## 2020-01-16 DIAGNOSIS — Z4800 Encounter for change or removal of nonsurgical wound dressing: Secondary | ICD-10-CM | POA: Diagnosis not present

## 2020-01-16 DIAGNOSIS — L89154 Pressure ulcer of sacral region, stage 4: Secondary | ICD-10-CM | POA: Diagnosis not present

## 2020-01-16 DIAGNOSIS — I409 Acute myocarditis, unspecified: Secondary | ICD-10-CM | POA: Diagnosis not present

## 2020-01-16 DIAGNOSIS — M6281 Muscle weakness (generalized): Secondary | ICD-10-CM | POA: Diagnosis not present

## 2020-01-16 DIAGNOSIS — U071 COVID-19: Secondary | ICD-10-CM | POA: Diagnosis not present

## 2020-01-17 ENCOUNTER — Other Ambulatory Visit: Payer: Self-pay | Admitting: *Deleted

## 2020-01-17 DIAGNOSIS — M6281 Muscle weakness (generalized): Secondary | ICD-10-CM | POA: Diagnosis not present

## 2020-01-17 DIAGNOSIS — L89154 Pressure ulcer of sacral region, stage 4: Secondary | ICD-10-CM | POA: Diagnosis not present

## 2020-01-17 DIAGNOSIS — J1281 Pneumonia due to SARS-associated coronavirus: Secondary | ICD-10-CM | POA: Diagnosis not present

## 2020-01-17 DIAGNOSIS — E11628 Type 2 diabetes mellitus with other skin complications: Secondary | ICD-10-CM | POA: Diagnosis not present

## 2020-01-17 DIAGNOSIS — Z8616 Personal history of COVID-19: Secondary | ICD-10-CM | POA: Diagnosis not present

## 2020-01-17 DIAGNOSIS — U071 COVID-19: Secondary | ICD-10-CM | POA: Diagnosis not present

## 2020-01-17 DIAGNOSIS — J1289 Other viral pneumonia: Secondary | ICD-10-CM | POA: Diagnosis not present

## 2020-01-17 DIAGNOSIS — I409 Acute myocarditis, unspecified: Secondary | ICD-10-CM | POA: Diagnosis not present

## 2020-01-17 DIAGNOSIS — Z4800 Encounter for change or removal of nonsurgical wound dressing: Secondary | ICD-10-CM | POA: Diagnosis not present

## 2020-01-17 DIAGNOSIS — L89153 Pressure ulcer of sacral region, stage 3: Secondary | ICD-10-CM | POA: Diagnosis not present

## 2020-01-17 NOTE — Patient Outreach (Signed)
[place] [facility]) Care Management  01/17/2020  KALPANA FURMANSKI August 16, 1946 [id number]   [place] spoke with pt by phone today who reports she is still working to arrange 24/7 care at home.  "It's been tough", she reports- sharing that the private duty caregiver agency has not been able to fill all the shifts needed.  "A neighbor stayed last night" and she indicates she does have a life alert system.  HH is also involved. CSW encouraged pt to consider seeing if another agency can fill the holes.  Pt gave CSW permission to call her HCPOA, Tessie Fass, to also discuss.  CSW did speak with [name] briefly but she wanted to call back at more convenient time.   Eduard Clos, MSW, [place] Worker  [place] [phone]

## 2020-01-18 ENCOUNTER — Ambulatory Visit: Payer: Medicare Other | Admitting: Orthotics

## 2020-01-18 DIAGNOSIS — M6281 Muscle weakness (generalized): Secondary | ICD-10-CM | POA: Diagnosis not present

## 2020-01-18 DIAGNOSIS — L89154 Pressure ulcer of sacral region, stage 4: Secondary | ICD-10-CM | POA: Diagnosis not present

## 2020-01-18 DIAGNOSIS — E11628 Type 2 diabetes mellitus with other skin complications: Secondary | ICD-10-CM | POA: Diagnosis not present

## 2020-01-18 DIAGNOSIS — U071 COVID-19: Secondary | ICD-10-CM | POA: Diagnosis not present

## 2020-01-18 DIAGNOSIS — Z4800 Encounter for change or removal of nonsurgical wound dressing: Secondary | ICD-10-CM | POA: Diagnosis not present

## 2020-01-18 DIAGNOSIS — I409 Acute myocarditis, unspecified: Secondary | ICD-10-CM | POA: Diagnosis not present

## 2020-01-20 ENCOUNTER — Ambulatory Visit: Payer: Self-pay | Admitting: *Deleted

## 2020-01-20 DIAGNOSIS — L89154 Pressure ulcer of sacral region, stage 4: Secondary | ICD-10-CM | POA: Diagnosis not present

## 2020-01-20 DIAGNOSIS — M6281 Muscle weakness (generalized): Secondary | ICD-10-CM | POA: Diagnosis not present

## 2020-01-20 DIAGNOSIS — E11628 Type 2 diabetes mellitus with other skin complications: Secondary | ICD-10-CM | POA: Diagnosis not present

## 2020-01-20 DIAGNOSIS — U071 COVID-19: Secondary | ICD-10-CM | POA: Diagnosis not present

## 2020-01-20 DIAGNOSIS — I409 Acute myocarditis, unspecified: Secondary | ICD-10-CM | POA: Diagnosis not present

## 2020-01-20 DIAGNOSIS — Z4800 Encounter for change or removal of nonsurgical wound dressing: Secondary | ICD-10-CM | POA: Diagnosis not present

## 2020-01-23 ENCOUNTER — Other Ambulatory Visit: Payer: Self-pay | Admitting: *Deleted

## 2020-01-23 DIAGNOSIS — I409 Acute myocarditis, unspecified: Secondary | ICD-10-CM | POA: Diagnosis not present

## 2020-01-23 DIAGNOSIS — Z4800 Encounter for change or removal of nonsurgical wound dressing: Secondary | ICD-10-CM | POA: Diagnosis not present

## 2020-01-23 DIAGNOSIS — U071 COVID-19: Secondary | ICD-10-CM | POA: Diagnosis not present

## 2020-01-23 DIAGNOSIS — M6281 Muscle weakness (generalized): Secondary | ICD-10-CM | POA: Diagnosis not present

## 2020-01-23 DIAGNOSIS — L89154 Pressure ulcer of sacral region, stage 4: Secondary | ICD-10-CM | POA: Diagnosis not present

## 2020-01-23 DIAGNOSIS — E11628 Type 2 diabetes mellitus with other skin complications: Secondary | ICD-10-CM | POA: Diagnosis not present

## 2020-01-23 NOTE — Patient Outreach (Signed)
[name]) Care Management  01/20/2020 Late Entry  Debra Mcconnell Jun 25, 1946 [id number]   CSW spoke with pt on 01/20/2020 who reports all is going ok.  They have not filled all the shifts for private care and CSW reminded pt there are other agencies and providers if they chose to.  CSW also attempted to reach pt's HCPOA, [name], and left a HIPPA compliant voicemail.  CSW will await callback or try again in 3-4 business days.   Eduard Clos, MSW, [place] Worker  [place] [phone]

## 2020-01-25 DIAGNOSIS — L89154 Pressure ulcer of sacral region, stage 4: Secondary | ICD-10-CM | POA: Diagnosis not present

## 2020-01-25 DIAGNOSIS — E11628 Type 2 diabetes mellitus with other skin complications: Secondary | ICD-10-CM | POA: Diagnosis not present

## 2020-01-25 DIAGNOSIS — I409 Acute myocarditis, unspecified: Secondary | ICD-10-CM | POA: Diagnosis not present

## 2020-01-25 DIAGNOSIS — M6281 Muscle weakness (generalized): Secondary | ICD-10-CM | POA: Diagnosis not present

## 2020-01-25 DIAGNOSIS — U071 COVID-19: Secondary | ICD-10-CM | POA: Diagnosis not present

## 2020-01-25 DIAGNOSIS — Z4800 Encounter for change or removal of nonsurgical wound dressing: Secondary | ICD-10-CM | POA: Diagnosis not present

## 2020-01-26 ENCOUNTER — Other Ambulatory Visit: Payer: Self-pay | Admitting: *Deleted

## 2020-01-26 DIAGNOSIS — M6281 Muscle weakness (generalized): Secondary | ICD-10-CM | POA: Diagnosis not present

## 2020-01-26 DIAGNOSIS — Z4800 Encounter for change or removal of nonsurgical wound dressing: Secondary | ICD-10-CM | POA: Diagnosis not present

## 2020-01-26 DIAGNOSIS — U071 COVID-19: Secondary | ICD-10-CM | POA: Diagnosis not present

## 2020-01-26 DIAGNOSIS — L89154 Pressure ulcer of sacral region, stage 4: Secondary | ICD-10-CM | POA: Diagnosis not present

## 2020-01-26 DIAGNOSIS — I409 Acute myocarditis, unspecified: Secondary | ICD-10-CM | POA: Diagnosis not present

## 2020-01-26 DIAGNOSIS — E11628 Type 2 diabetes mellitus with other skin complications: Secondary | ICD-10-CM | POA: Diagnosis not present

## 2020-01-26 NOTE — Patient Outreach (Signed)
[place] [facility]) Care Management  01/26/2020  Debra Mcconnell 11-May-1946 [id number]   CSW made contact with pt by phone who reports things are going better. She indicates getting caregiver support arranged is "all worked out". She continues to receive [facility] services to include PT and OT. Pt's HHPT session was happening at time of my call which allowed the HHPT to share with pt's permission that she is progressing with both PT and OT.  Pt continues to decline long term placement needs and the HHPT agrees that this is not warranted at this time.  CSW will plan case closure at this time; pt agrees.  CSW will advise PCP and [facility] team of above.   Eduard Clos, MSW, [place] Worker  [place] [phone]

## 2020-01-27 DIAGNOSIS — I409 Acute myocarditis, unspecified: Secondary | ICD-10-CM | POA: Diagnosis not present

## 2020-01-27 DIAGNOSIS — Z4800 Encounter for change or removal of nonsurgical wound dressing: Secondary | ICD-10-CM | POA: Diagnosis not present

## 2020-01-27 DIAGNOSIS — L89154 Pressure ulcer of sacral region, stage 4: Secondary | ICD-10-CM | POA: Diagnosis not present

## 2020-01-27 DIAGNOSIS — M6281 Muscle weakness (generalized): Secondary | ICD-10-CM | POA: Diagnosis not present

## 2020-01-27 DIAGNOSIS — U071 COVID-19: Secondary | ICD-10-CM | POA: Diagnosis not present

## 2020-01-27 DIAGNOSIS — E11628 Type 2 diabetes mellitus with other skin complications: Secondary | ICD-10-CM | POA: Diagnosis not present

## 2020-01-30 DIAGNOSIS — M6281 Muscle weakness (generalized): Secondary | ICD-10-CM | POA: Diagnosis not present

## 2020-01-30 DIAGNOSIS — Z4800 Encounter for change or removal of nonsurgical wound dressing: Secondary | ICD-10-CM | POA: Diagnosis not present

## 2020-01-30 DIAGNOSIS — E11628 Type 2 diabetes mellitus with other skin complications: Secondary | ICD-10-CM | POA: Diagnosis not present

## 2020-01-30 DIAGNOSIS — U071 COVID-19: Secondary | ICD-10-CM | POA: Diagnosis not present

## 2020-01-30 DIAGNOSIS — I409 Acute myocarditis, unspecified: Secondary | ICD-10-CM | POA: Diagnosis not present

## 2020-01-30 DIAGNOSIS — L89154 Pressure ulcer of sacral region, stage 4: Secondary | ICD-10-CM | POA: Diagnosis not present

## 2020-02-01 ENCOUNTER — Other Ambulatory Visit: Payer: Medicare Other | Admitting: *Deleted

## 2020-02-01 DIAGNOSIS — I409 Acute myocarditis, unspecified: Secondary | ICD-10-CM | POA: Diagnosis not present

## 2020-02-01 DIAGNOSIS — L89154 Pressure ulcer of sacral region, stage 4: Secondary | ICD-10-CM | POA: Diagnosis not present

## 2020-02-01 DIAGNOSIS — Z4800 Encounter for change or removal of nonsurgical wound dressing: Secondary | ICD-10-CM | POA: Diagnosis not present

## 2020-02-01 DIAGNOSIS — E11628 Type 2 diabetes mellitus with other skin complications: Secondary | ICD-10-CM | POA: Diagnosis not present

## 2020-02-01 DIAGNOSIS — U071 COVID-19: Secondary | ICD-10-CM | POA: Diagnosis not present

## 2020-02-01 DIAGNOSIS — M6281 Muscle weakness (generalized): Secondary | ICD-10-CM | POA: Diagnosis not present

## 2020-02-02 DIAGNOSIS — E113419 Type 2 diabetes mellitus with severe nonproliferative diabetic retinopathy with macular edema, unspecified eye: Secondary | ICD-10-CM | POA: Diagnosis not present

## 2020-02-02 DIAGNOSIS — E1140 Type 2 diabetes mellitus with diabetic neuropathy, unspecified: Secondary | ICD-10-CM | POA: Diagnosis not present

## 2020-02-02 DIAGNOSIS — Z8616 Personal history of COVID-19: Secondary | ICD-10-CM | POA: Diagnosis not present

## 2020-02-02 DIAGNOSIS — I10 Essential (primary) hypertension: Secondary | ICD-10-CM | POA: Diagnosis not present

## 2020-02-02 DIAGNOSIS — R600 Localized edema: Secondary | ICD-10-CM | POA: Diagnosis not present

## 2020-02-02 DIAGNOSIS — E1129 Type 2 diabetes mellitus with other diabetic kidney complication: Secondary | ICD-10-CM | POA: Diagnosis not present

## 2020-02-02 DIAGNOSIS — Z79899 Other long term (current) drug therapy: Secondary | ICD-10-CM | POA: Diagnosis not present

## 2020-02-02 DIAGNOSIS — M6281 Muscle weakness (generalized): Secondary | ICD-10-CM | POA: Diagnosis not present

## 2020-02-03 DIAGNOSIS — I409 Acute myocarditis, unspecified: Secondary | ICD-10-CM | POA: Diagnosis not present

## 2020-02-03 DIAGNOSIS — U071 COVID-19: Secondary | ICD-10-CM | POA: Diagnosis not present

## 2020-02-03 DIAGNOSIS — M6281 Muscle weakness (generalized): Secondary | ICD-10-CM | POA: Diagnosis not present

## 2020-02-03 DIAGNOSIS — E11628 Type 2 diabetes mellitus with other skin complications: Secondary | ICD-10-CM | POA: Diagnosis not present

## 2020-02-03 DIAGNOSIS — Z4800 Encounter for change or removal of nonsurgical wound dressing: Secondary | ICD-10-CM | POA: Diagnosis not present

## 2020-02-03 DIAGNOSIS — L89154 Pressure ulcer of sacral region, stage 4: Secondary | ICD-10-CM | POA: Diagnosis not present

## 2020-02-05 DIAGNOSIS — Z4800 Encounter for change or removal of nonsurgical wound dressing: Secondary | ICD-10-CM | POA: Diagnosis not present

## 2020-02-14 DIAGNOSIS — M79673 Pain in unspecified foot: Secondary | ICD-10-CM | POA: Diagnosis not present

## 2020-02-14 DIAGNOSIS — I110 Hypertensive heart disease with heart failure: Secondary | ICD-10-CM | POA: Diagnosis not present

## 2020-02-21 DIAGNOSIS — E1142 Type 2 diabetes mellitus with diabetic polyneuropathy: Secondary | ICD-10-CM | POA: Diagnosis not present

## 2020-02-21 DIAGNOSIS — L89153 Pressure ulcer of sacral region, stage 3: Secondary | ICD-10-CM | POA: Diagnosis not present

## 2020-02-21 DIAGNOSIS — E11622 Type 2 diabetes mellitus with other skin ulcer: Secondary | ICD-10-CM | POA: Diagnosis not present

## 2020-03-01 ENCOUNTER — Ambulatory Visit (INDEPENDENT_AMBULATORY_CARE_PROVIDER_SITE_OTHER): Payer: Medicare Other | Admitting: Podiatry

## 2020-03-01 ENCOUNTER — Other Ambulatory Visit: Payer: Self-pay

## 2020-03-01 ENCOUNTER — Encounter: Payer: Self-pay | Admitting: Podiatry

## 2020-03-01 DIAGNOSIS — M2042 Other hammer toe(s) (acquired), left foot: Secondary | ICD-10-CM | POA: Diagnosis not present

## 2020-03-01 DIAGNOSIS — M2041 Other hammer toe(s) (acquired), right foot: Secondary | ICD-10-CM | POA: Diagnosis not present

## 2020-03-01 DIAGNOSIS — M79675 Pain in left toe(s): Secondary | ICD-10-CM

## 2020-03-01 DIAGNOSIS — M2142 Flat foot [pes planus] (acquired), left foot: Secondary | ICD-10-CM | POA: Diagnosis not present

## 2020-03-01 DIAGNOSIS — E1142 Type 2 diabetes mellitus with diabetic polyneuropathy: Secondary | ICD-10-CM | POA: Diagnosis not present

## 2020-03-01 DIAGNOSIS — B351 Tinea unguium: Secondary | ICD-10-CM

## 2020-03-01 DIAGNOSIS — M79674 Pain in right toe(s): Secondary | ICD-10-CM

## 2020-03-01 DIAGNOSIS — M2141 Flat foot [pes planus] (acquired), right foot: Secondary | ICD-10-CM

## 2020-03-06 DIAGNOSIS — E1140 Type 2 diabetes mellitus with diabetic neuropathy, unspecified: Secondary | ICD-10-CM | POA: Diagnosis not present

## 2020-03-06 DIAGNOSIS — I409 Acute myocarditis, unspecified: Secondary | ICD-10-CM | POA: Diagnosis not present

## 2020-03-06 DIAGNOSIS — U071 COVID-19: Secondary | ICD-10-CM | POA: Diagnosis not present

## 2020-03-06 DIAGNOSIS — R1312 Dysphagia, oropharyngeal phase: Secondary | ICD-10-CM | POA: Diagnosis not present

## 2020-03-06 DIAGNOSIS — R279 Unspecified lack of coordination: Secondary | ICD-10-CM | POA: Diagnosis not present

## 2020-03-06 DIAGNOSIS — Z8673 Personal history of transient ischemic attack (TIA), and cerebral infarction without residual deficits: Secondary | ICD-10-CM | POA: Diagnosis not present

## 2020-03-06 DIAGNOSIS — L89154 Pressure ulcer of sacral region, stage 4: Secondary | ICD-10-CM | POA: Diagnosis not present

## 2020-03-06 DIAGNOSIS — I5082 Biventricular heart failure: Secondary | ICD-10-CM | POA: Diagnosis not present

## 2020-03-06 DIAGNOSIS — I480 Paroxysmal atrial fibrillation: Secondary | ICD-10-CM | POA: Diagnosis not present

## 2020-03-06 DIAGNOSIS — Z4800 Encounter for change or removal of nonsurgical wound dressing: Secondary | ICD-10-CM | POA: Diagnosis not present

## 2020-03-06 DIAGNOSIS — E669 Obesity, unspecified: Secondary | ICD-10-CM | POA: Diagnosis not present

## 2020-03-06 DIAGNOSIS — R269 Unspecified abnormalities of gait and mobility: Secondary | ICD-10-CM | POA: Diagnosis not present

## 2020-03-06 DIAGNOSIS — M6281 Muscle weakness (generalized): Secondary | ICD-10-CM | POA: Diagnosis not present

## 2020-03-06 DIAGNOSIS — E11628 Type 2 diabetes mellitus with other skin complications: Secondary | ICD-10-CM | POA: Diagnosis not present

## 2020-03-06 DIAGNOSIS — F419 Anxiety disorder, unspecified: Secondary | ICD-10-CM | POA: Diagnosis not present

## 2020-03-06 DIAGNOSIS — Z7901 Long term (current) use of anticoagulants: Secondary | ICD-10-CM | POA: Diagnosis not present

## 2020-03-06 DIAGNOSIS — I110 Hypertensive heart disease with heart failure: Secondary | ICD-10-CM | POA: Diagnosis not present

## 2020-03-06 DIAGNOSIS — G4733 Obstructive sleep apnea (adult) (pediatric): Secondary | ICD-10-CM | POA: Diagnosis not present

## 2020-03-06 DIAGNOSIS — E039 Hypothyroidism, unspecified: Secondary | ICD-10-CM | POA: Diagnosis not present

## 2020-03-06 DIAGNOSIS — E1139 Type 2 diabetes mellitus with other diabetic ophthalmic complication: Secondary | ICD-10-CM | POA: Diagnosis not present

## 2020-03-07 NOTE — Progress Notes (Signed)
Subjective: Debra Mcconnell presents today at risk foot care with history of diabetic neuropathy and painful mycotic nails b/l that are difficult to trim. Pain interferes with ambulation. Aggravating factors include wearing enclosed shoe gear. Pain is relieved with periodic professional debridement.  She is also picking up her diabetic shoes on today's visit.  Greig Right, MD is patient's PCP.   Past Medical History:  Diagnosis Date  . Acquired hammer toes of both feet 01/22/2017  . Arthritis 11/29/2013  . Atrial flutter ([place]) 01/24/2014   Overview:  CHADS2 vasc score= 5  . B-complex deficiency 11/29/2013  . Cerebral artery occlusion with cerebral infarction ([place]) 12/04/2013   Overview:  STORY: MRI +ve Left frontoparietal infarction (LMCA). +AFib.`E1o3L`IMPRESSION: BP is under controlled. Continue home health. +AFib, con't Eliquis 5mg  bid. Will obtain old record from [facility] for review.  . Diabetic polyneuropathy associated with type 2 diabetes mellitus ([place]) 01/22/2017  . Essential hypertension 11/13/2015  . Hyperlipemia, retention 11/13/2015  . OSA (obstructive sleep apnea) 01/24/2014   Overview:  IMPRESSION: Will obtain old record for review and ask DME to d/l compliance and fax the result to me. F/u in 6 wks.  . Pre-ulcerative corn or callous 01/22/2017  . Stroke ([place]) 01/24/2014  . Thyroid disease 11/29/2013  . 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 Pneumonia due to COVID-19 virus 10/19/2019  . Acute respiratory failure with hypoxia ([place])   . Biventricular failure ([place])   . Pressure injury of skin 10/05/2019  . 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 Bilateral sensorineural hearing loss 05/17/2019  . Sudden right hearing loss 05/17/2019  . Comprehensive diabetic foot examination, type 2 DM, encounter for ([place]) 11/11/2018  . Chronic anticoagulation 04/16/2017  . Acquired hammer  toes of both feet 01/22/2017  . Diabetic polyneuropathy associated with type 2 diabetes mellitus ([place]) 01/22/2017  . Pre-ulcerative corn or callous 01/22/2017  . Essential hypertension 11/13/2015  . Hyperlipemia, retention 11/13/2015  . Atrial flutter ([place]) 01/24/2014  . OSA (obstructive sleep apnea) 01/24/2014  . Stroke ([place]) 01/24/2014  . Cerebral artery occlusion with cerebral infarction ([place]) 12/04/2013  . Arthritis 11/29/2013  . B-complex deficiency 11/29/2013  . Sleep apnea 11/29/2013  . Thyroid disease 11/29/2013  . 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 atorvastatin (LIPITOR) 40 MG tablet Take 40 mg by mouth daily.    Marland Kitchen buPROPion (WELLBUTRIN) 100 MG tablet Take 1 tablet (100 mg total) by mouth 3 (three) times daily. 90 tablet 0  . carvedilol (COREG) 3.125 MG tablet Take 1 tablet (3.125 mg total) by mouth 2 (two) times daily. 60 tablet 0  . Cholecalciferol (VITAMIN D) 2000 units CAPS Take 4,000 Units by mouth 2 (two) times daily.    . Cyanocobalamin (VITAMIN B 12 PO) Take 1,000 mcg by mouth daily.    Marland Kitchen ELIQUIS 5 MG TABS tablet TAKE 1 TABLET TWICE A DAY  (MUST SCHEDULE APPOINTMENT FOR FOLLOW UP BEFORE ANY   ADDITIONAL REFILLS) 180 tablet 3  . Exenatide ER 2 MG SRER Inject 2 mg into the skin once a week.     . ferrous sulfate 325 (65 FE) MG EC tablet Take 325 mg by mouth 2 (two) times daily with a meal.    . fluconazole (DIFLUCAN) 150 MG tablet Take 150 mg  by mouth once.    . furosemide (LASIX) 20 MG tablet Take 20 mg by mouth daily.    Marland Kitchen gabapentin (NEURONTIN) 250 MG/5ML solution Take 4 mLs (200 mg total) by mouth every 12 (twelve) hours. 240 mL 0  . gabapentin (NEURONTIN) 300 MG capsule     . glimepiride (AMARYL) 1 MG tablet Take 1-2 mg by mouth See admin instructions. Take 2 mg by mouth in the morning and 1 mg in the evening    . hydrALAZINE  (APRESOLINE) 25 MG tablet Take 25 mg by mouth 3 (three) times daily.    . hydrALAZINE (APRESOLINE) 50 MG tablet Take 50 mg by mouth 3 (three) times daily.    Marland Kitchen levothyroxine (SYNTHROID, LEVOTHROID) 175 MCG tablet Take 175 mcg by mouth daily.    Marland Kitchen nystatin ointment (MYCOSTATIN) APPLY TOPICALLY TO THE AFFECTED AREA TWICE DAILY    . omeprazole (PRILOSEC) 20 MG capsule Take 20 mg by mouth daily.     Glory Rosebush VERIO test strip daily.    . potassium chloride (KLOR-CON) 10 MEQ tablet Take 15 mEq by mouth daily.     No current facility-administered medications on file prior to visit.     Allergies  Allergen Reactions  . Gabapentin Other (See Comments)    Dizziness and drowsiness  . Lisinopril Cough    Objective: Debra Mcconnell is a pleasant 74 y.o. Caucasian female in NAD. AAO x 3.  There were no vitals filed for this visit.  Vascular Examination: Capillary refill time to digits <4 seconds b/l lower extremities. Faintly palpable DP pulse(s) b/l lower extremities. Nonpalpable PT pulse(s) b/l lower extremities. Pedal hair absent. Lower extremity skin temperature gradient within normal limits. Trace edema noted b/l lower extremities. Varicosities present b/l.  Dermatological Examination: Pedal skin with normal turgor, texture and tone bilaterally. No open wounds bilaterally. No interdigital macerations bilaterally. Toenails 1-5 b/l elongated, discolored, dystrophic, thickened, crumbly with subungual debris and tenderness to dorsal palpation.  Musculoskeletal: Normal muscle strength 5/5 to all lower extremity muscle groups bilaterally. No pain crepitus or joint limitation noted with ROM b/l. Hammertoes noted to the b/l lower extremities. Pes planus deformity noted b/l.   Neurological Examination: Protective sensation diminished with 10g monofilament b/l. Vibratory sensation diminished b/l. Babinski reflex negative b/l. Clonus negative b/l.  Last A1c: Hemoglobin A1C Latest Ref Rng & Units  10/06/2019  HGBA1C 4.8 - 5.6 % 6.4(H)  Some recent data might be hidden     Assessment: 1. Pain due to onychomycosis of toenails of both feet   2. Diabetic polyneuropathy associated with type 2 diabetes mellitus ([place])    Plan: -Examined patient. -No new findings. No new orders. -Continue diabetic foot care principles. -Toenails 1-5 b/l were debrided in length and girth with sterile nail nippers and dremel without iatrogenic bleeding.  -Patient to report any pedal injuries to medical professional immediately. -Continue diabetic shoes daily. -Patient/POA to call should there be question/concern in the interim.  Return in about 3 months (around 06/01/2020).  Marzetta Board, DPM

## 2020-03-08 DIAGNOSIS — E11628 Type 2 diabetes mellitus with other skin complications: Secondary | ICD-10-CM | POA: Diagnosis not present

## 2020-03-08 DIAGNOSIS — Z4800 Encounter for change or removal of nonsurgical wound dressing: Secondary | ICD-10-CM | POA: Diagnosis not present

## 2020-03-08 DIAGNOSIS — L89154 Pressure ulcer of sacral region, stage 4: Secondary | ICD-10-CM | POA: Diagnosis not present

## 2020-03-08 DIAGNOSIS — U071 COVID-19: Secondary | ICD-10-CM | POA: Diagnosis not present

## 2020-03-08 DIAGNOSIS — M6281 Muscle weakness (generalized): Secondary | ICD-10-CM | POA: Diagnosis not present

## 2020-03-08 DIAGNOSIS — I409 Acute myocarditis, unspecified: Secondary | ICD-10-CM | POA: Diagnosis not present

## 2020-03-09 DIAGNOSIS — M6281 Muscle weakness (generalized): Secondary | ICD-10-CM | POA: Diagnosis not present

## 2020-03-09 DIAGNOSIS — U071 COVID-19: Secondary | ICD-10-CM | POA: Diagnosis not present

## 2020-03-09 DIAGNOSIS — E11628 Type 2 diabetes mellitus with other skin complications: Secondary | ICD-10-CM | POA: Diagnosis not present

## 2020-03-09 DIAGNOSIS — I409 Acute myocarditis, unspecified: Secondary | ICD-10-CM | POA: Diagnosis not present

## 2020-03-09 DIAGNOSIS — L89154 Pressure ulcer of sacral region, stage 4: Secondary | ICD-10-CM | POA: Diagnosis not present

## 2020-03-09 DIAGNOSIS — Z4800 Encounter for change or removal of nonsurgical wound dressing: Secondary | ICD-10-CM | POA: Diagnosis not present

## 2020-03-13 DIAGNOSIS — L89154 Pressure ulcer of sacral region, stage 4: Secondary | ICD-10-CM | POA: Diagnosis not present

## 2020-03-13 DIAGNOSIS — Z4800 Encounter for change or removal of nonsurgical wound dressing: Secondary | ICD-10-CM | POA: Diagnosis not present

## 2020-03-13 DIAGNOSIS — I409 Acute myocarditis, unspecified: Secondary | ICD-10-CM | POA: Diagnosis not present

## 2020-03-13 DIAGNOSIS — U071 COVID-19: Secondary | ICD-10-CM | POA: Diagnosis not present

## 2020-03-13 DIAGNOSIS — E11628 Type 2 diabetes mellitus with other skin complications: Secondary | ICD-10-CM | POA: Diagnosis not present

## 2020-03-13 DIAGNOSIS — M6281 Muscle weakness (generalized): Secondary | ICD-10-CM | POA: Diagnosis not present

## 2020-03-14 DIAGNOSIS — U071 COVID-19: Secondary | ICD-10-CM | POA: Diagnosis not present

## 2020-03-14 DIAGNOSIS — L89154 Pressure ulcer of sacral region, stage 4: Secondary | ICD-10-CM | POA: Diagnosis not present

## 2020-03-14 DIAGNOSIS — Z4800 Encounter for change or removal of nonsurgical wound dressing: Secondary | ICD-10-CM | POA: Diagnosis not present

## 2020-03-14 DIAGNOSIS — E11628 Type 2 diabetes mellitus with other skin complications: Secondary | ICD-10-CM | POA: Diagnosis not present

## 2020-03-14 DIAGNOSIS — I409 Acute myocarditis, unspecified: Secondary | ICD-10-CM | POA: Diagnosis not present

## 2020-03-14 DIAGNOSIS — M6281 Muscle weakness (generalized): Secondary | ICD-10-CM | POA: Diagnosis not present

## 2020-03-15 DIAGNOSIS — U071 COVID-19: Secondary | ICD-10-CM | POA: Diagnosis not present

## 2020-03-15 DIAGNOSIS — M6281 Muscle weakness (generalized): Secondary | ICD-10-CM | POA: Diagnosis not present

## 2020-03-15 DIAGNOSIS — L89154 Pressure ulcer of sacral region, stage 4: Secondary | ICD-10-CM | POA: Diagnosis not present

## 2020-03-15 DIAGNOSIS — I409 Acute myocarditis, unspecified: Secondary | ICD-10-CM | POA: Diagnosis not present

## 2020-03-15 DIAGNOSIS — Z4800 Encounter for change or removal of nonsurgical wound dressing: Secondary | ICD-10-CM | POA: Diagnosis not present

## 2020-03-15 DIAGNOSIS — E11628 Type 2 diabetes mellitus with other skin complications: Secondary | ICD-10-CM | POA: Diagnosis not present

## 2020-03-16 DIAGNOSIS — B948 Sequelae of other specified infectious and parasitic diseases: Secondary | ICD-10-CM | POA: Diagnosis not present

## 2020-03-16 DIAGNOSIS — R531 Weakness: Secondary | ICD-10-CM | POA: Diagnosis not present

## 2020-03-16 DIAGNOSIS — J189 Pneumonia, unspecified organism: Secondary | ICD-10-CM | POA: Diagnosis not present

## 2020-03-16 DIAGNOSIS — E669 Obesity, unspecified: Secondary | ICD-10-CM | POA: Diagnosis present

## 2020-03-16 DIAGNOSIS — R509 Fever, unspecified: Secondary | ICD-10-CM | POA: Diagnosis not present

## 2020-03-16 DIAGNOSIS — Z79899 Other long term (current) drug therapy: Secondary | ICD-10-CM | POA: Diagnosis not present

## 2020-03-16 DIAGNOSIS — R52 Pain, unspecified: Secondary | ICD-10-CM | POA: Diagnosis not present

## 2020-03-16 DIAGNOSIS — I2510 Atherosclerotic heart disease of native coronary artery without angina pectoris: Secondary | ICD-10-CM | POA: Diagnosis present

## 2020-03-16 DIAGNOSIS — J449 Chronic obstructive pulmonary disease, unspecified: Secondary | ICD-10-CM | POA: Diagnosis present

## 2020-03-16 DIAGNOSIS — L89154 Pressure ulcer of sacral region, stage 4: Secondary | ICD-10-CM | POA: Diagnosis not present

## 2020-03-16 DIAGNOSIS — J9811 Atelectasis: Secondary | ICD-10-CM | POA: Diagnosis not present

## 2020-03-16 DIAGNOSIS — R0902 Hypoxemia: Secondary | ICD-10-CM | POA: Diagnosis not present

## 2020-03-16 DIAGNOSIS — I252 Old myocardial infarction: Secondary | ICD-10-CM | POA: Diagnosis not present

## 2020-03-16 DIAGNOSIS — I409 Acute myocarditis, unspecified: Secondary | ICD-10-CM | POA: Diagnosis not present

## 2020-03-16 DIAGNOSIS — R5381 Other malaise: Secondary | ICD-10-CM | POA: Diagnosis not present

## 2020-03-16 DIAGNOSIS — J690 Pneumonitis due to inhalation of food and vomit: Secondary | ICD-10-CM | POA: Diagnosis present

## 2020-03-16 DIAGNOSIS — L89153 Pressure ulcer of sacral region, stage 3: Secondary | ICD-10-CM | POA: Diagnosis not present

## 2020-03-16 DIAGNOSIS — I10 Essential (primary) hypertension: Secondary | ICD-10-CM | POA: Diagnosis present

## 2020-03-16 DIAGNOSIS — G7281 Critical illness myopathy: Secondary | ICD-10-CM | POA: Diagnosis not present

## 2020-03-16 DIAGNOSIS — M6281 Muscle weakness (generalized): Secondary | ICD-10-CM | POA: Diagnosis not present

## 2020-03-16 DIAGNOSIS — Z7902 Long term (current) use of antithrombotics/antiplatelets: Secondary | ICD-10-CM | POA: Diagnosis not present

## 2020-03-16 DIAGNOSIS — Z4800 Encounter for change or removal of nonsurgical wound dressing: Secondary | ICD-10-CM | POA: Diagnosis not present

## 2020-03-16 DIAGNOSIS — E11628 Type 2 diabetes mellitus with other skin complications: Secondary | ICD-10-CM | POA: Diagnosis not present

## 2020-03-16 DIAGNOSIS — I482 Chronic atrial fibrillation, unspecified: Secondary | ICD-10-CM | POA: Diagnosis present

## 2020-03-16 DIAGNOSIS — Z7901 Long term (current) use of anticoagulants: Secondary | ICD-10-CM | POA: Diagnosis not present

## 2020-03-16 DIAGNOSIS — R532 Functional quadriplegia: Secondary | ICD-10-CM | POA: Diagnosis present

## 2020-03-16 DIAGNOSIS — U071 COVID-19: Secondary | ICD-10-CM | POA: Diagnosis not present

## 2020-03-16 DIAGNOSIS — E039 Hypothyroidism, unspecified: Secondary | ICD-10-CM | POA: Diagnosis not present

## 2020-03-16 DIAGNOSIS — Z8673 Personal history of transient ischemic attack (TIA), and cerebral infarction without residual deficits: Secondary | ICD-10-CM | POA: Diagnosis not present

## 2020-03-16 DIAGNOSIS — Z888 Allergy status to other drugs, medicaments and biological substances status: Secondary | ICD-10-CM | POA: Diagnosis not present

## 2020-03-16 DIAGNOSIS — E119 Type 2 diabetes mellitus without complications: Secondary | ICD-10-CM | POA: Diagnosis present

## 2020-03-16 DIAGNOSIS — J9601 Acute respiratory failure with hypoxia: Secondary | ICD-10-CM | POA: Diagnosis present

## 2020-03-20 DIAGNOSIS — I409 Acute myocarditis, unspecified: Secondary | ICD-10-CM | POA: Diagnosis not present

## 2020-03-20 DIAGNOSIS — E11628 Type 2 diabetes mellitus with other skin complications: Secondary | ICD-10-CM | POA: Diagnosis not present

## 2020-03-20 DIAGNOSIS — Z4800 Encounter for change or removal of nonsurgical wound dressing: Secondary | ICD-10-CM | POA: Diagnosis not present

## 2020-03-20 DIAGNOSIS — M6281 Muscle weakness (generalized): Secondary | ICD-10-CM | POA: Diagnosis not present

## 2020-03-20 DIAGNOSIS — U071 COVID-19: Secondary | ICD-10-CM | POA: Diagnosis not present

## 2020-03-20 DIAGNOSIS — L89154 Pressure ulcer of sacral region, stage 4: Secondary | ICD-10-CM | POA: Diagnosis not present

## 2020-03-21 DIAGNOSIS — K644 Residual hemorrhoidal skin tags: Secondary | ICD-10-CM | POA: Diagnosis not present

## 2020-03-21 DIAGNOSIS — L8994 Pressure ulcer of unspecified site, stage 4: Secondary | ICD-10-CM | POA: Diagnosis not present

## 2020-03-22 DIAGNOSIS — E11628 Type 2 diabetes mellitus with other skin complications: Secondary | ICD-10-CM | POA: Diagnosis not present

## 2020-03-22 DIAGNOSIS — I409 Acute myocarditis, unspecified: Secondary | ICD-10-CM | POA: Diagnosis not present

## 2020-03-22 DIAGNOSIS — M6281 Muscle weakness (generalized): Secondary | ICD-10-CM | POA: Diagnosis not present

## 2020-03-22 DIAGNOSIS — U071 COVID-19: Secondary | ICD-10-CM | POA: Diagnosis not present

## 2020-03-22 DIAGNOSIS — Z4800 Encounter for change or removal of nonsurgical wound dressing: Secondary | ICD-10-CM | POA: Diagnosis not present

## 2020-03-22 DIAGNOSIS — L89154 Pressure ulcer of sacral region, stage 4: Secondary | ICD-10-CM | POA: Diagnosis not present

## 2020-03-23 DIAGNOSIS — E11628 Type 2 diabetes mellitus with other skin complications: Secondary | ICD-10-CM | POA: Diagnosis not present

## 2020-03-23 DIAGNOSIS — L89154 Pressure ulcer of sacral region, stage 4: Secondary | ICD-10-CM | POA: Diagnosis not present

## 2020-03-23 DIAGNOSIS — I409 Acute myocarditis, unspecified: Secondary | ICD-10-CM | POA: Diagnosis not present

## 2020-03-23 DIAGNOSIS — U071 COVID-19: Secondary | ICD-10-CM | POA: Diagnosis not present

## 2020-03-23 DIAGNOSIS — M6281 Muscle weakness (generalized): Secondary | ICD-10-CM | POA: Diagnosis not present

## 2020-03-23 DIAGNOSIS — Z4800 Encounter for change or removal of nonsurgical wound dressing: Secondary | ICD-10-CM | POA: Diagnosis not present

## 2020-03-24 DIAGNOSIS — M6281 Muscle weakness (generalized): Secondary | ICD-10-CM | POA: Diagnosis not present

## 2020-03-24 DIAGNOSIS — Z4800 Encounter for change or removal of nonsurgical wound dressing: Secondary | ICD-10-CM | POA: Diagnosis not present

## 2020-03-24 DIAGNOSIS — L89154 Pressure ulcer of sacral region, stage 4: Secondary | ICD-10-CM | POA: Diagnosis not present

## 2020-03-24 DIAGNOSIS — E11628 Type 2 diabetes mellitus with other skin complications: Secondary | ICD-10-CM | POA: Diagnosis not present

## 2020-03-24 DIAGNOSIS — I409 Acute myocarditis, unspecified: Secondary | ICD-10-CM | POA: Diagnosis not present

## 2020-03-24 DIAGNOSIS — U071 COVID-19: Secondary | ICD-10-CM | POA: Diagnosis not present

## 2020-03-25 DIAGNOSIS — Z4800 Encounter for change or removal of nonsurgical wound dressing: Secondary | ICD-10-CM | POA: Diagnosis not present

## 2020-03-25 DIAGNOSIS — I409 Acute myocarditis, unspecified: Secondary | ICD-10-CM | POA: Diagnosis not present

## 2020-03-25 DIAGNOSIS — E11628 Type 2 diabetes mellitus with other skin complications: Secondary | ICD-10-CM | POA: Diagnosis not present

## 2020-03-25 DIAGNOSIS — L89154 Pressure ulcer of sacral region, stage 4: Secondary | ICD-10-CM | POA: Diagnosis not present

## 2020-03-25 DIAGNOSIS — M6281 Muscle weakness (generalized): Secondary | ICD-10-CM | POA: Diagnosis not present

## 2020-03-25 DIAGNOSIS — U071 COVID-19: Secondary | ICD-10-CM | POA: Diagnosis not present

## 2020-03-26 DIAGNOSIS — L89154 Pressure ulcer of sacral region, stage 4: Secondary | ICD-10-CM | POA: Diagnosis not present

## 2020-03-26 DIAGNOSIS — E11628 Type 2 diabetes mellitus with other skin complications: Secondary | ICD-10-CM | POA: Diagnosis not present

## 2020-03-26 DIAGNOSIS — M6281 Muscle weakness (generalized): Secondary | ICD-10-CM | POA: Diagnosis not present

## 2020-03-26 DIAGNOSIS — Z8701 Personal history of pneumonia (recurrent): Secondary | ICD-10-CM | POA: Diagnosis not present

## 2020-03-26 DIAGNOSIS — U071 COVID-19: Secondary | ICD-10-CM | POA: Diagnosis not present

## 2020-03-26 DIAGNOSIS — J159 Unspecified bacterial pneumonia: Secondary | ICD-10-CM | POA: Diagnosis not present

## 2020-03-26 DIAGNOSIS — I409 Acute myocarditis, unspecified: Secondary | ICD-10-CM | POA: Diagnosis not present

## 2020-03-26 DIAGNOSIS — Z4800 Encounter for change or removal of nonsurgical wound dressing: Secondary | ICD-10-CM | POA: Diagnosis not present

## 2020-03-26 DIAGNOSIS — L8994 Pressure ulcer of unspecified site, stage 4: Secondary | ICD-10-CM | POA: Diagnosis not present

## 2020-03-27 DIAGNOSIS — L89154 Pressure ulcer of sacral region, stage 4: Secondary | ICD-10-CM | POA: Diagnosis not present

## 2020-03-27 DIAGNOSIS — M6281 Muscle weakness (generalized): Secondary | ICD-10-CM | POA: Diagnosis not present

## 2020-03-27 DIAGNOSIS — Z4800 Encounter for change or removal of nonsurgical wound dressing: Secondary | ICD-10-CM | POA: Diagnosis not present

## 2020-03-27 DIAGNOSIS — I409 Acute myocarditis, unspecified: Secondary | ICD-10-CM | POA: Diagnosis not present

## 2020-03-27 DIAGNOSIS — U071 COVID-19: Secondary | ICD-10-CM | POA: Diagnosis not present

## 2020-03-27 DIAGNOSIS — E11628 Type 2 diabetes mellitus with other skin complications: Secondary | ICD-10-CM | POA: Diagnosis not present

## 2020-03-27 NOTE — Telephone Encounter (Signed)
Close this encounter

## 2020-03-28 DIAGNOSIS — L89153 Pressure ulcer of sacral region, stage 3: Secondary | ICD-10-CM | POA: Diagnosis not present

## 2020-03-28 DIAGNOSIS — L89323 Pressure ulcer of left buttock, stage 3: Secondary | ICD-10-CM | POA: Diagnosis not present

## 2020-03-28 DIAGNOSIS — E11622 Type 2 diabetes mellitus with other skin ulcer: Secondary | ICD-10-CM | POA: Diagnosis not present

## 2020-03-28 DIAGNOSIS — K602 Anal fissure, unspecified: Secondary | ICD-10-CM | POA: Diagnosis not present

## 2020-03-29 DIAGNOSIS — L89154 Pressure ulcer of sacral region, stage 4: Secondary | ICD-10-CM | POA: Diagnosis not present

## 2020-03-29 DIAGNOSIS — Z4800 Encounter for change or removal of nonsurgical wound dressing: Secondary | ICD-10-CM | POA: Diagnosis not present

## 2020-03-29 DIAGNOSIS — E11628 Type 2 diabetes mellitus with other skin complications: Secondary | ICD-10-CM | POA: Diagnosis not present

## 2020-03-29 DIAGNOSIS — U071 COVID-19: Secondary | ICD-10-CM | POA: Diagnosis not present

## 2020-03-29 DIAGNOSIS — I409 Acute myocarditis, unspecified: Secondary | ICD-10-CM | POA: Diagnosis not present

## 2020-03-29 DIAGNOSIS — M6281 Muscle weakness (generalized): Secondary | ICD-10-CM | POA: Diagnosis not present

## 2020-04-02 DIAGNOSIS — Z4800 Encounter for change or removal of nonsurgical wound dressing: Secondary | ICD-10-CM | POA: Diagnosis not present

## 2020-04-02 DIAGNOSIS — M6281 Muscle weakness (generalized): Secondary | ICD-10-CM | POA: Diagnosis not present

## 2020-04-02 DIAGNOSIS — U071 COVID-19: Secondary | ICD-10-CM | POA: Diagnosis not present

## 2020-04-02 DIAGNOSIS — I409 Acute myocarditis, unspecified: Secondary | ICD-10-CM | POA: Diagnosis not present

## 2020-04-02 DIAGNOSIS — L89154 Pressure ulcer of sacral region, stage 4: Secondary | ICD-10-CM | POA: Diagnosis not present

## 2020-04-02 DIAGNOSIS — E11628 Type 2 diabetes mellitus with other skin complications: Secondary | ICD-10-CM | POA: Diagnosis not present

## 2020-04-05 DIAGNOSIS — Z4800 Encounter for change or removal of nonsurgical wound dressing: Secondary | ICD-10-CM | POA: Diagnosis not present

## 2020-04-05 DIAGNOSIS — J189 Pneumonia, unspecified organism: Secondary | ICD-10-CM | POA: Diagnosis not present

## 2020-04-05 DIAGNOSIS — I110 Hypertensive heart disease with heart failure: Secondary | ICD-10-CM | POA: Diagnosis not present

## 2020-04-05 DIAGNOSIS — M6281 Muscle weakness (generalized): Secondary | ICD-10-CM | POA: Diagnosis not present

## 2020-04-05 DIAGNOSIS — R1312 Dysphagia, oropharyngeal phase: Secondary | ICD-10-CM | POA: Diagnosis not present

## 2020-04-05 DIAGNOSIS — F419 Anxiety disorder, unspecified: Secondary | ICD-10-CM | POA: Diagnosis not present

## 2020-04-05 DIAGNOSIS — Z8673 Personal history of transient ischemic attack (TIA), and cerebral infarction without residual deficits: Secondary | ICD-10-CM | POA: Diagnosis not present

## 2020-04-05 DIAGNOSIS — R269 Unspecified abnormalities of gait and mobility: Secondary | ICD-10-CM | POA: Diagnosis not present

## 2020-04-05 DIAGNOSIS — I5082 Biventricular heart failure: Secondary | ICD-10-CM | POA: Diagnosis not present

## 2020-04-05 DIAGNOSIS — E1139 Type 2 diabetes mellitus with other diabetic ophthalmic complication: Secondary | ICD-10-CM | POA: Diagnosis not present

## 2020-04-05 DIAGNOSIS — I480 Paroxysmal atrial fibrillation: Secondary | ICD-10-CM | POA: Diagnosis not present

## 2020-04-05 DIAGNOSIS — Z8616 Personal history of COVID-19: Secondary | ICD-10-CM | POA: Diagnosis not present

## 2020-04-05 DIAGNOSIS — R279 Unspecified lack of coordination: Secondary | ICD-10-CM | POA: Diagnosis not present

## 2020-04-05 DIAGNOSIS — L89154 Pressure ulcer of sacral region, stage 4: Secondary | ICD-10-CM | POA: Diagnosis not present

## 2020-04-05 DIAGNOSIS — G4733 Obstructive sleep apnea (adult) (pediatric): Secondary | ICD-10-CM | POA: Diagnosis not present

## 2020-04-05 DIAGNOSIS — E11628 Type 2 diabetes mellitus with other skin complications: Secondary | ICD-10-CM | POA: Diagnosis not present

## 2020-04-05 DIAGNOSIS — Z7901 Long term (current) use of anticoagulants: Secondary | ICD-10-CM | POA: Diagnosis not present

## 2020-04-05 DIAGNOSIS — I409 Acute myocarditis, unspecified: Secondary | ICD-10-CM | POA: Diagnosis not present

## 2020-04-05 DIAGNOSIS — E1140 Type 2 diabetes mellitus with diabetic neuropathy, unspecified: Secondary | ICD-10-CM | POA: Diagnosis not present

## 2020-04-05 DIAGNOSIS — E669 Obesity, unspecified: Secondary | ICD-10-CM | POA: Diagnosis not present

## 2020-04-05 DIAGNOSIS — E039 Hypothyroidism, unspecified: Secondary | ICD-10-CM | POA: Diagnosis not present

## 2020-04-09 DIAGNOSIS — E11649 Type 2 diabetes mellitus with hypoglycemia without coma: Secondary | ICD-10-CM | POA: Diagnosis present

## 2020-04-09 DIAGNOSIS — Z9049 Acquired absence of other specified parts of digestive tract: Secondary | ICD-10-CM | POA: Diagnosis not present

## 2020-04-09 DIAGNOSIS — J189 Pneumonia, unspecified organism: Secondary | ICD-10-CM | POA: Diagnosis not present

## 2020-04-09 DIAGNOSIS — N178 Other acute kidney failure: Secondary | ICD-10-CM | POA: Diagnosis present

## 2020-04-09 DIAGNOSIS — J9811 Atelectasis: Secondary | ICD-10-CM | POA: Diagnosis not present

## 2020-04-09 DIAGNOSIS — L89154 Pressure ulcer of sacral region, stage 4: Secondary | ICD-10-CM | POA: Diagnosis not present

## 2020-04-09 DIAGNOSIS — R4781 Slurred speech: Secondary | ICD-10-CM | POA: Diagnosis not present

## 2020-04-09 DIAGNOSIS — I4891 Unspecified atrial fibrillation: Secondary | ICD-10-CM | POA: Diagnosis present

## 2020-04-09 DIAGNOSIS — Z9884 Bariatric surgery status: Secondary | ICD-10-CM | POA: Diagnosis not present

## 2020-04-09 DIAGNOSIS — J90 Pleural effusion, not elsewhere classified: Secondary | ICD-10-CM | POA: Diagnosis not present

## 2020-04-09 DIAGNOSIS — E1122 Type 2 diabetes mellitus with diabetic chronic kidney disease: Secondary | ICD-10-CM | POA: Diagnosis present

## 2020-04-09 DIAGNOSIS — I5022 Chronic systolic (congestive) heart failure: Secondary | ICD-10-CM | POA: Diagnosis present

## 2020-04-09 DIAGNOSIS — Z96653 Presence of artificial knee joint, bilateral: Secondary | ICD-10-CM | POA: Diagnosis present

## 2020-04-09 DIAGNOSIS — L03115 Cellulitis of right lower limb: Secondary | ICD-10-CM | POA: Diagnosis not present

## 2020-04-09 DIAGNOSIS — R001 Bradycardia, unspecified: Secondary | ICD-10-CM | POA: Diagnosis not present

## 2020-04-09 DIAGNOSIS — Z888 Allergy status to other drugs, medicaments and biological substances status: Secondary | ICD-10-CM | POA: Diagnosis not present

## 2020-04-09 DIAGNOSIS — N183 Chronic kidney disease, stage 3 unspecified: Secondary | ICD-10-CM | POA: Diagnosis present

## 2020-04-09 DIAGNOSIS — Z7984 Long term (current) use of oral hypoglycemic drugs: Secondary | ICD-10-CM | POA: Diagnosis not present

## 2020-04-09 DIAGNOSIS — Z8673 Personal history of transient ischemic attack (TIA), and cerebral infarction without residual deficits: Secondary | ICD-10-CM | POA: Diagnosis not present

## 2020-04-09 DIAGNOSIS — Z7902 Long term (current) use of antithrombotics/antiplatelets: Secondary | ICD-10-CM | POA: Diagnosis not present

## 2020-04-09 DIAGNOSIS — Z79899 Other long term (current) drug therapy: Secondary | ICD-10-CM | POA: Diagnosis not present

## 2020-04-09 DIAGNOSIS — Z79890 Hormone replacement therapy (postmenopausal): Secondary | ICD-10-CM | POA: Diagnosis not present

## 2020-04-09 DIAGNOSIS — G9341 Metabolic encephalopathy: Secondary | ICD-10-CM | POA: Diagnosis present

## 2020-04-09 DIAGNOSIS — E039 Hypothyroidism, unspecified: Secondary | ICD-10-CM | POA: Diagnosis present

## 2020-04-09 DIAGNOSIS — R531 Weakness: Secondary | ICD-10-CM | POA: Diagnosis not present

## 2020-04-09 DIAGNOSIS — L89159 Pressure ulcer of sacral region, unspecified stage: Secondary | ICD-10-CM | POA: Diagnosis present

## 2020-04-09 DIAGNOSIS — L98421 Non-pressure chronic ulcer of back limited to breakdown of skin: Secondary | ICD-10-CM | POA: Diagnosis not present

## 2020-04-09 DIAGNOSIS — R532 Functional quadriplegia: Secondary | ICD-10-CM | POA: Diagnosis not present

## 2020-04-09 DIAGNOSIS — E11628 Type 2 diabetes mellitus with other skin complications: Secondary | ICD-10-CM | POA: Diagnosis not present

## 2020-04-09 DIAGNOSIS — I959 Hypotension, unspecified: Secondary | ICD-10-CM | POA: Diagnosis not present

## 2020-04-09 DIAGNOSIS — I130 Hypertensive heart and chronic kidney disease with heart failure and stage 1 through stage 4 chronic kidney disease, or unspecified chronic kidney disease: Secondary | ICD-10-CM | POA: Diagnosis not present

## 2020-04-09 DIAGNOSIS — I252 Old myocardial infarction: Secondary | ICD-10-CM | POA: Diagnosis not present

## 2020-04-09 DIAGNOSIS — E871 Hypo-osmolality and hyponatremia: Secondary | ICD-10-CM | POA: Diagnosis present

## 2020-04-09 DIAGNOSIS — M6281 Muscle weakness (generalized): Secondary | ICD-10-CM | POA: Diagnosis not present

## 2020-04-09 DIAGNOSIS — I517 Cardiomegaly: Secondary | ICD-10-CM | POA: Diagnosis not present

## 2020-04-09 DIAGNOSIS — R404 Transient alteration of awareness: Secondary | ICD-10-CM | POA: Diagnosis not present

## 2020-04-09 DIAGNOSIS — I409 Acute myocarditis, unspecified: Secondary | ICD-10-CM | POA: Diagnosis not present

## 2020-04-09 DIAGNOSIS — Z4800 Encounter for change or removal of nonsurgical wound dressing: Secondary | ICD-10-CM | POA: Diagnosis not present

## 2020-04-09 DIAGNOSIS — J449 Chronic obstructive pulmonary disease, unspecified: Secondary | ICD-10-CM | POA: Diagnosis present

## 2020-04-09 DIAGNOSIS — R4701 Aphasia: Secondary | ICD-10-CM | POA: Diagnosis not present

## 2020-04-09 DIAGNOSIS — I5023 Acute on chronic systolic (congestive) heart failure: Secondary | ICD-10-CM | POA: Diagnosis present

## 2020-04-09 DIAGNOSIS — R4182 Altered mental status, unspecified: Secondary | ICD-10-CM | POA: Diagnosis not present

## 2020-04-14 DIAGNOSIS — J189 Pneumonia, unspecified organism: Secondary | ICD-10-CM | POA: Diagnosis not present

## 2020-04-14 DIAGNOSIS — E11628 Type 2 diabetes mellitus with other skin complications: Secondary | ICD-10-CM | POA: Diagnosis not present

## 2020-04-14 DIAGNOSIS — Z4800 Encounter for change or removal of nonsurgical wound dressing: Secondary | ICD-10-CM | POA: Diagnosis not present

## 2020-04-14 DIAGNOSIS — L89154 Pressure ulcer of sacral region, stage 4: Secondary | ICD-10-CM | POA: Diagnosis not present

## 2020-04-14 DIAGNOSIS — I409 Acute myocarditis, unspecified: Secondary | ICD-10-CM | POA: Diagnosis not present

## 2020-04-14 DIAGNOSIS — M6281 Muscle weakness (generalized): Secondary | ICD-10-CM | POA: Diagnosis not present

## 2020-04-16 ENCOUNTER — Other Ambulatory Visit: Payer: Self-pay | Admitting: *Deleted

## 2020-04-16 DIAGNOSIS — L89154 Pressure ulcer of sacral region, stage 4: Secondary | ICD-10-CM | POA: Diagnosis not present

## 2020-04-16 DIAGNOSIS — E11628 Type 2 diabetes mellitus with other skin complications: Secondary | ICD-10-CM | POA: Diagnosis not present

## 2020-04-16 DIAGNOSIS — M6281 Muscle weakness (generalized): Secondary | ICD-10-CM | POA: Diagnosis not present

## 2020-04-16 DIAGNOSIS — J189 Pneumonia, unspecified organism: Secondary | ICD-10-CM | POA: Diagnosis not present

## 2020-04-16 DIAGNOSIS — I409 Acute myocarditis, unspecified: Secondary | ICD-10-CM | POA: Diagnosis not present

## 2020-04-16 DIAGNOSIS — Z4800 Encounter for change or removal of nonsurgical wound dressing: Secondary | ICD-10-CM | POA: Diagnosis not present

## 2020-04-16 NOTE — Patient Outreach (Signed)
[facility]) Care Management  04/16/2020  Debra Mcconnell Jan 12, 1946 [id number]   Referral received from hospital liaison as member was recently admitted to [facility] twice in the last 30 days (7/9-7/12 for respiratory failure and Pneumonia, 8/2-8/6 for CHF and cellulitis).  Call placed to member for post hospital assessment.  This care manager introduced self and stated purpose of call.  [facility] care management services explained.  Attempted to verify member's identity however she state this is not a good time to talk as it is too late in the day.  Request this RNCM call back later in the week.  Will send outreach letter and follow up within the next 3-4 business days.  Valente David, [place], MSN [place] [phone]

## 2020-04-17 DIAGNOSIS — M6281 Muscle weakness (generalized): Secondary | ICD-10-CM | POA: Diagnosis not present

## 2020-04-17 DIAGNOSIS — L89154 Pressure ulcer of sacral region, stage 4: Secondary | ICD-10-CM | POA: Diagnosis not present

## 2020-04-17 DIAGNOSIS — I409 Acute myocarditis, unspecified: Secondary | ICD-10-CM | POA: Diagnosis not present

## 2020-04-17 DIAGNOSIS — Z4800 Encounter for change or removal of nonsurgical wound dressing: Secondary | ICD-10-CM | POA: Diagnosis not present

## 2020-04-17 DIAGNOSIS — E11628 Type 2 diabetes mellitus with other skin complications: Secondary | ICD-10-CM | POA: Diagnosis not present

## 2020-04-17 DIAGNOSIS — J189 Pneumonia, unspecified organism: Secondary | ICD-10-CM | POA: Diagnosis not present

## 2020-04-18 DIAGNOSIS — I409 Acute myocarditis, unspecified: Secondary | ICD-10-CM | POA: Diagnosis not present

## 2020-04-18 DIAGNOSIS — E11628 Type 2 diabetes mellitus with other skin complications: Secondary | ICD-10-CM | POA: Diagnosis not present

## 2020-04-18 DIAGNOSIS — M6281 Muscle weakness (generalized): Secondary | ICD-10-CM | POA: Diagnosis not present

## 2020-04-18 DIAGNOSIS — J189 Pneumonia, unspecified organism: Secondary | ICD-10-CM | POA: Diagnosis not present

## 2020-04-18 DIAGNOSIS — Z4800 Encounter for change or removal of nonsurgical wound dressing: Secondary | ICD-10-CM | POA: Diagnosis not present

## 2020-04-18 DIAGNOSIS — L89154 Pressure ulcer of sacral region, stage 4: Secondary | ICD-10-CM | POA: Diagnosis not present

## 2020-04-20 ENCOUNTER — Other Ambulatory Visit: Payer: Self-pay | Admitting: *Deleted

## 2020-04-20 DIAGNOSIS — L89154 Pressure ulcer of sacral region, stage 4: Secondary | ICD-10-CM | POA: Diagnosis not present

## 2020-04-20 DIAGNOSIS — I409 Acute myocarditis, unspecified: Secondary | ICD-10-CM | POA: Diagnosis not present

## 2020-04-20 DIAGNOSIS — E11628 Type 2 diabetes mellitus with other skin complications: Secondary | ICD-10-CM | POA: Diagnosis not present

## 2020-04-20 DIAGNOSIS — J189 Pneumonia, unspecified organism: Secondary | ICD-10-CM | POA: Diagnosis not present

## 2020-04-20 DIAGNOSIS — M6281 Muscle weakness (generalized): Secondary | ICD-10-CM | POA: Diagnosis not present

## 2020-04-20 DIAGNOSIS — Z4800 Encounter for change or removal of nonsurgical wound dressing: Secondary | ICD-10-CM | POA: Diagnosis not present

## 2020-04-20 NOTE — Patient Outreach (Signed)
[facility]) Care Management  04/20/2020  Debra Mcconnell 02/15/1946 [id number]   Call placed to member to follow up on assessment of needs.   Identity verified.  This care manager introduced self and stated purpose of call.  [facility] care management services explained.  She report she lives alone but has caregivers coming into the home 24/7.  State she is mostly independent with ADL's but they are there if she would need anything.  Attempted to review medications, MD appointments, and management of chronic medical conditions, she declines offer stating "they take care of everything."  Requested to speak with one of the aides, she declined stating there was nothing further she needed.  Will close case at this time as member declines engagement.  Valente David, [place], MSN [place] [phone]

## 2020-04-23 DIAGNOSIS — E11628 Type 2 diabetes mellitus with other skin complications: Secondary | ICD-10-CM | POA: Diagnosis not present

## 2020-04-23 DIAGNOSIS — Z4800 Encounter for change or removal of nonsurgical wound dressing: Secondary | ICD-10-CM | POA: Diagnosis not present

## 2020-04-23 DIAGNOSIS — I409 Acute myocarditis, unspecified: Secondary | ICD-10-CM | POA: Diagnosis not present

## 2020-04-23 DIAGNOSIS — M6281 Muscle weakness (generalized): Secondary | ICD-10-CM | POA: Diagnosis not present

## 2020-04-23 DIAGNOSIS — J189 Pneumonia, unspecified organism: Secondary | ICD-10-CM | POA: Diagnosis not present

## 2020-04-23 DIAGNOSIS — L89154 Pressure ulcer of sacral region, stage 4: Secondary | ICD-10-CM | POA: Diagnosis not present

## 2020-04-24 DIAGNOSIS — L89154 Pressure ulcer of sacral region, stage 4: Secondary | ICD-10-CM | POA: Diagnosis not present

## 2020-04-24 DIAGNOSIS — E11628 Type 2 diabetes mellitus with other skin complications: Secondary | ICD-10-CM | POA: Diagnosis not present

## 2020-04-24 DIAGNOSIS — J189 Pneumonia, unspecified organism: Secondary | ICD-10-CM | POA: Diagnosis not present

## 2020-04-24 DIAGNOSIS — Z4800 Encounter for change or removal of nonsurgical wound dressing: Secondary | ICD-10-CM | POA: Diagnosis not present

## 2020-04-24 DIAGNOSIS — M6281 Muscle weakness (generalized): Secondary | ICD-10-CM | POA: Diagnosis not present

## 2020-04-24 DIAGNOSIS — I409 Acute myocarditis, unspecified: Secondary | ICD-10-CM | POA: Diagnosis not present

## 2020-04-25 DIAGNOSIS — L89153 Pressure ulcer of sacral region, stage 3: Secondary | ICD-10-CM | POA: Diagnosis not present

## 2020-04-25 DIAGNOSIS — K602 Anal fissure, unspecified: Secondary | ICD-10-CM | POA: Diagnosis not present

## 2020-04-26 DIAGNOSIS — L89154 Pressure ulcer of sacral region, stage 4: Secondary | ICD-10-CM | POA: Diagnosis not present

## 2020-04-26 DIAGNOSIS — E11628 Type 2 diabetes mellitus with other skin complications: Secondary | ICD-10-CM | POA: Diagnosis not present

## 2020-04-26 DIAGNOSIS — I409 Acute myocarditis, unspecified: Secondary | ICD-10-CM | POA: Diagnosis not present

## 2020-04-26 DIAGNOSIS — Z4800 Encounter for change or removal of nonsurgical wound dressing: Secondary | ICD-10-CM | POA: Diagnosis not present

## 2020-04-26 DIAGNOSIS — J189 Pneumonia, unspecified organism: Secondary | ICD-10-CM | POA: Diagnosis not present

## 2020-04-26 DIAGNOSIS — M6281 Muscle weakness (generalized): Secondary | ICD-10-CM | POA: Diagnosis not present

## 2020-04-27 DIAGNOSIS — L89154 Pressure ulcer of sacral region, stage 4: Secondary | ICD-10-CM | POA: Diagnosis not present

## 2020-04-27 DIAGNOSIS — E11628 Type 2 diabetes mellitus with other skin complications: Secondary | ICD-10-CM | POA: Diagnosis not present

## 2020-04-27 DIAGNOSIS — M6281 Muscle weakness (generalized): Secondary | ICD-10-CM | POA: Diagnosis not present

## 2020-04-27 DIAGNOSIS — Z4800 Encounter for change or removal of nonsurgical wound dressing: Secondary | ICD-10-CM | POA: Diagnosis not present

## 2020-04-27 DIAGNOSIS — J189 Pneumonia, unspecified organism: Secondary | ICD-10-CM | POA: Diagnosis not present

## 2020-04-27 DIAGNOSIS — I409 Acute myocarditis, unspecified: Secondary | ICD-10-CM | POA: Diagnosis not present

## 2020-04-30 DIAGNOSIS — L89154 Pressure ulcer of sacral region, stage 4: Secondary | ICD-10-CM | POA: Diagnosis not present

## 2020-04-30 DIAGNOSIS — M6281 Muscle weakness (generalized): Secondary | ICD-10-CM | POA: Diagnosis not present

## 2020-04-30 DIAGNOSIS — Z4800 Encounter for change or removal of nonsurgical wound dressing: Secondary | ICD-10-CM | POA: Diagnosis not present

## 2020-04-30 DIAGNOSIS — J189 Pneumonia, unspecified organism: Secondary | ICD-10-CM | POA: Diagnosis not present

## 2020-04-30 DIAGNOSIS — I409 Acute myocarditis, unspecified: Secondary | ICD-10-CM | POA: Diagnosis not present

## 2020-04-30 DIAGNOSIS — E11628 Type 2 diabetes mellitus with other skin complications: Secondary | ICD-10-CM | POA: Diagnosis not present

## 2020-05-02 DIAGNOSIS — E11628 Type 2 diabetes mellitus with other skin complications: Secondary | ICD-10-CM | POA: Diagnosis not present

## 2020-05-02 DIAGNOSIS — M6281 Muscle weakness (generalized): Secondary | ICD-10-CM | POA: Diagnosis not present

## 2020-05-02 DIAGNOSIS — J189 Pneumonia, unspecified organism: Secondary | ICD-10-CM | POA: Diagnosis not present

## 2020-05-02 DIAGNOSIS — L89154 Pressure ulcer of sacral region, stage 4: Secondary | ICD-10-CM | POA: Diagnosis not present

## 2020-05-02 DIAGNOSIS — Z4800 Encounter for change or removal of nonsurgical wound dressing: Secondary | ICD-10-CM | POA: Diagnosis not present

## 2020-05-02 DIAGNOSIS — E113493 Type 2 diabetes mellitus with severe nonproliferative diabetic retinopathy without macular edema, bilateral: Secondary | ICD-10-CM | POA: Diagnosis not present

## 2020-05-02 DIAGNOSIS — I409 Acute myocarditis, unspecified: Secondary | ICD-10-CM | POA: Diagnosis not present

## 2020-05-03 DIAGNOSIS — E11628 Type 2 diabetes mellitus with other skin complications: Secondary | ICD-10-CM | POA: Diagnosis not present

## 2020-05-03 DIAGNOSIS — M6281 Muscle weakness (generalized): Secondary | ICD-10-CM | POA: Diagnosis not present

## 2020-05-03 DIAGNOSIS — L89154 Pressure ulcer of sacral region, stage 4: Secondary | ICD-10-CM | POA: Diagnosis not present

## 2020-05-03 DIAGNOSIS — Z4800 Encounter for change or removal of nonsurgical wound dressing: Secondary | ICD-10-CM | POA: Diagnosis not present

## 2020-05-03 DIAGNOSIS — J189 Pneumonia, unspecified organism: Secondary | ICD-10-CM | POA: Diagnosis not present

## 2020-05-03 DIAGNOSIS — I409 Acute myocarditis, unspecified: Secondary | ICD-10-CM | POA: Diagnosis not present

## 2020-05-04 DIAGNOSIS — E11628 Type 2 diabetes mellitus with other skin complications: Secondary | ICD-10-CM | POA: Diagnosis not present

## 2020-05-04 DIAGNOSIS — I409 Acute myocarditis, unspecified: Secondary | ICD-10-CM | POA: Diagnosis not present

## 2020-05-04 DIAGNOSIS — L89154 Pressure ulcer of sacral region, stage 4: Secondary | ICD-10-CM | POA: Diagnosis not present

## 2020-05-04 DIAGNOSIS — Z4800 Encounter for change or removal of nonsurgical wound dressing: Secondary | ICD-10-CM | POA: Diagnosis not present

## 2020-05-04 DIAGNOSIS — M6281 Muscle weakness (generalized): Secondary | ICD-10-CM | POA: Diagnosis not present

## 2020-05-04 DIAGNOSIS — J189 Pneumonia, unspecified organism: Secondary | ICD-10-CM | POA: Diagnosis not present

## 2020-05-05 DIAGNOSIS — Z8616 Personal history of COVID-19: Secondary | ICD-10-CM | POA: Diagnosis not present

## 2020-05-05 DIAGNOSIS — G9341 Metabolic encephalopathy: Secondary | ICD-10-CM | POA: Diagnosis not present

## 2020-05-05 DIAGNOSIS — E669 Obesity, unspecified: Secondary | ICD-10-CM | POA: Diagnosis not present

## 2020-05-05 DIAGNOSIS — G4733 Obstructive sleep apnea (adult) (pediatric): Secondary | ICD-10-CM | POA: Diagnosis not present

## 2020-05-05 DIAGNOSIS — E11628 Type 2 diabetes mellitus with other skin complications: Secondary | ICD-10-CM | POA: Diagnosis not present

## 2020-05-05 DIAGNOSIS — L89154 Pressure ulcer of sacral region, stage 4: Secondary | ICD-10-CM | POA: Diagnosis not present

## 2020-05-05 DIAGNOSIS — I409 Acute myocarditis, unspecified: Secondary | ICD-10-CM | POA: Diagnosis not present

## 2020-05-05 DIAGNOSIS — I480 Paroxysmal atrial fibrillation: Secondary | ICD-10-CM | POA: Diagnosis not present

## 2020-05-05 DIAGNOSIS — E1139 Type 2 diabetes mellitus with other diabetic ophthalmic complication: Secondary | ICD-10-CM | POA: Diagnosis not present

## 2020-05-05 DIAGNOSIS — I5082 Biventricular heart failure: Secondary | ICD-10-CM | POA: Diagnosis not present

## 2020-05-05 DIAGNOSIS — L03115 Cellulitis of right lower limb: Secondary | ICD-10-CM | POA: Diagnosis not present

## 2020-05-05 DIAGNOSIS — I130 Hypertensive heart and chronic kidney disease with heart failure and stage 1 through stage 4 chronic kidney disease, or unspecified chronic kidney disease: Secondary | ICD-10-CM | POA: Diagnosis not present

## 2020-05-05 DIAGNOSIS — Z4800 Encounter for change or removal of nonsurgical wound dressing: Secondary | ICD-10-CM | POA: Diagnosis not present

## 2020-05-05 DIAGNOSIS — Z7901 Long term (current) use of anticoagulants: Secondary | ICD-10-CM | POA: Diagnosis not present

## 2020-05-05 DIAGNOSIS — E039 Hypothyroidism, unspecified: Secondary | ICD-10-CM | POA: Diagnosis not present

## 2020-05-05 DIAGNOSIS — Z8673 Personal history of transient ischemic attack (TIA), and cerebral infarction without residual deficits: Secondary | ICD-10-CM | POA: Diagnosis not present

## 2020-05-05 DIAGNOSIS — N183 Chronic kidney disease, stage 3 unspecified: Secondary | ICD-10-CM | POA: Diagnosis not present

## 2020-05-05 DIAGNOSIS — F419 Anxiety disorder, unspecified: Secondary | ICD-10-CM | POA: Diagnosis not present

## 2020-05-05 DIAGNOSIS — M6281 Muscle weakness (generalized): Secondary | ICD-10-CM | POA: Diagnosis not present

## 2020-05-05 DIAGNOSIS — R1312 Dysphagia, oropharyngeal phase: Secondary | ICD-10-CM | POA: Diagnosis not present

## 2020-05-05 DIAGNOSIS — E1140 Type 2 diabetes mellitus with diabetic neuropathy, unspecified: Secondary | ICD-10-CM | POA: Diagnosis not present

## 2020-05-05 DIAGNOSIS — R279 Unspecified lack of coordination: Secondary | ICD-10-CM | POA: Diagnosis not present

## 2020-05-05 DIAGNOSIS — R269 Unspecified abnormalities of gait and mobility: Secondary | ICD-10-CM | POA: Diagnosis not present

## 2020-05-07 DIAGNOSIS — L03115 Cellulitis of right lower limb: Secondary | ICD-10-CM | POA: Diagnosis not present

## 2020-05-07 DIAGNOSIS — I5082 Biventricular heart failure: Secondary | ICD-10-CM | POA: Diagnosis not present

## 2020-05-07 DIAGNOSIS — Z4800 Encounter for change or removal of nonsurgical wound dressing: Secondary | ICD-10-CM | POA: Diagnosis not present

## 2020-05-07 DIAGNOSIS — I130 Hypertensive heart and chronic kidney disease with heart failure and stage 1 through stage 4 chronic kidney disease, or unspecified chronic kidney disease: Secondary | ICD-10-CM | POA: Diagnosis not present

## 2020-05-07 DIAGNOSIS — L89154 Pressure ulcer of sacral region, stage 4: Secondary | ICD-10-CM | POA: Diagnosis not present

## 2020-05-07 DIAGNOSIS — G9341 Metabolic encephalopathy: Secondary | ICD-10-CM | POA: Diagnosis not present

## 2020-05-08 DIAGNOSIS — L03115 Cellulitis of right lower limb: Secondary | ICD-10-CM | POA: Diagnosis not present

## 2020-05-08 DIAGNOSIS — G9341 Metabolic encephalopathy: Secondary | ICD-10-CM | POA: Diagnosis not present

## 2020-05-08 DIAGNOSIS — L89154 Pressure ulcer of sacral region, stage 4: Secondary | ICD-10-CM | POA: Diagnosis not present

## 2020-05-08 DIAGNOSIS — I5082 Biventricular heart failure: Secondary | ICD-10-CM | POA: Diagnosis not present

## 2020-05-08 DIAGNOSIS — Z4800 Encounter for change or removal of nonsurgical wound dressing: Secondary | ICD-10-CM | POA: Diagnosis not present

## 2020-05-08 DIAGNOSIS — N183 Chronic kidney disease, stage 3 unspecified: Secondary | ICD-10-CM | POA: Diagnosis not present

## 2020-05-08 DIAGNOSIS — I130 Hypertensive heart and chronic kidney disease with heart failure and stage 1 through stage 4 chronic kidney disease, or unspecified chronic kidney disease: Secondary | ICD-10-CM | POA: Diagnosis not present

## 2020-05-09 DIAGNOSIS — K602 Anal fissure, unspecified: Secondary | ICD-10-CM | POA: Diagnosis not present

## 2020-05-09 DIAGNOSIS — L97222 Non-pressure chronic ulcer of left calf with fat layer exposed: Secondary | ICD-10-CM | POA: Diagnosis not present

## 2020-05-09 DIAGNOSIS — L89153 Pressure ulcer of sacral region, stage 3: Secondary | ICD-10-CM | POA: Diagnosis not present

## 2020-05-09 DIAGNOSIS — E119 Type 2 diabetes mellitus without complications: Secondary | ICD-10-CM | POA: Diagnosis not present

## 2020-05-10 DIAGNOSIS — L03115 Cellulitis of right lower limb: Secondary | ICD-10-CM | POA: Diagnosis not present

## 2020-05-10 DIAGNOSIS — L89154 Pressure ulcer of sacral region, stage 4: Secondary | ICD-10-CM | POA: Diagnosis not present

## 2020-05-10 DIAGNOSIS — I130 Hypertensive heart and chronic kidney disease with heart failure and stage 1 through stage 4 chronic kidney disease, or unspecified chronic kidney disease: Secondary | ICD-10-CM | POA: Diagnosis not present

## 2020-05-10 DIAGNOSIS — I5082 Biventricular heart failure: Secondary | ICD-10-CM | POA: Diagnosis not present

## 2020-05-10 DIAGNOSIS — G9341 Metabolic encephalopathy: Secondary | ICD-10-CM | POA: Diagnosis not present

## 2020-05-10 DIAGNOSIS — Z4800 Encounter for change or removal of nonsurgical wound dressing: Secondary | ICD-10-CM | POA: Diagnosis not present

## 2020-05-11 DIAGNOSIS — G9341 Metabolic encephalopathy: Secondary | ICD-10-CM | POA: Diagnosis not present

## 2020-05-11 DIAGNOSIS — I130 Hypertensive heart and chronic kidney disease with heart failure and stage 1 through stage 4 chronic kidney disease, or unspecified chronic kidney disease: Secondary | ICD-10-CM | POA: Diagnosis not present

## 2020-05-11 DIAGNOSIS — L03115 Cellulitis of right lower limb: Secondary | ICD-10-CM | POA: Diagnosis not present

## 2020-05-11 DIAGNOSIS — Z4800 Encounter for change or removal of nonsurgical wound dressing: Secondary | ICD-10-CM | POA: Diagnosis not present

## 2020-05-11 DIAGNOSIS — L89154 Pressure ulcer of sacral region, stage 4: Secondary | ICD-10-CM | POA: Diagnosis not present

## 2020-05-11 DIAGNOSIS — I5082 Biventricular heart failure: Secondary | ICD-10-CM | POA: Diagnosis not present

## 2020-05-14 DIAGNOSIS — G9341 Metabolic encephalopathy: Secondary | ICD-10-CM | POA: Diagnosis not present

## 2020-05-14 DIAGNOSIS — I130 Hypertensive heart and chronic kidney disease with heart failure and stage 1 through stage 4 chronic kidney disease, or unspecified chronic kidney disease: Secondary | ICD-10-CM | POA: Diagnosis not present

## 2020-05-14 DIAGNOSIS — I5082 Biventricular heart failure: Secondary | ICD-10-CM | POA: Diagnosis not present

## 2020-05-14 DIAGNOSIS — Z4800 Encounter for change or removal of nonsurgical wound dressing: Secondary | ICD-10-CM | POA: Diagnosis not present

## 2020-05-14 DIAGNOSIS — L03115 Cellulitis of right lower limb: Secondary | ICD-10-CM | POA: Diagnosis not present

## 2020-05-14 DIAGNOSIS — L89154 Pressure ulcer of sacral region, stage 4: Secondary | ICD-10-CM | POA: Diagnosis not present

## 2020-05-16 DIAGNOSIS — I5082 Biventricular heart failure: Secondary | ICD-10-CM | POA: Diagnosis not present

## 2020-05-16 DIAGNOSIS — Z4800 Encounter for change or removal of nonsurgical wound dressing: Secondary | ICD-10-CM | POA: Diagnosis not present

## 2020-05-16 DIAGNOSIS — L89154 Pressure ulcer of sacral region, stage 4: Secondary | ICD-10-CM | POA: Diagnosis not present

## 2020-05-16 DIAGNOSIS — G9341 Metabolic encephalopathy: Secondary | ICD-10-CM | POA: Diagnosis not present

## 2020-05-16 DIAGNOSIS — L03115 Cellulitis of right lower limb: Secondary | ICD-10-CM | POA: Diagnosis not present

## 2020-05-16 DIAGNOSIS — I130 Hypertensive heart and chronic kidney disease with heart failure and stage 1 through stage 4 chronic kidney disease, or unspecified chronic kidney disease: Secondary | ICD-10-CM | POA: Diagnosis not present

## 2020-05-17 DIAGNOSIS — I130 Hypertensive heart and chronic kidney disease with heart failure and stage 1 through stage 4 chronic kidney disease, or unspecified chronic kidney disease: Secondary | ICD-10-CM | POA: Diagnosis not present

## 2020-05-17 DIAGNOSIS — I5082 Biventricular heart failure: Secondary | ICD-10-CM | POA: Diagnosis not present

## 2020-05-17 DIAGNOSIS — G9341 Metabolic encephalopathy: Secondary | ICD-10-CM | POA: Diagnosis not present

## 2020-05-17 DIAGNOSIS — L89154 Pressure ulcer of sacral region, stage 4: Secondary | ICD-10-CM | POA: Diagnosis not present

## 2020-05-17 DIAGNOSIS — L03115 Cellulitis of right lower limb: Secondary | ICD-10-CM | POA: Diagnosis not present

## 2020-05-17 DIAGNOSIS — Z4800 Encounter for change or removal of nonsurgical wound dressing: Secondary | ICD-10-CM | POA: Diagnosis not present

## 2020-05-18 DIAGNOSIS — I130 Hypertensive heart and chronic kidney disease with heart failure and stage 1 through stage 4 chronic kidney disease, or unspecified chronic kidney disease: Secondary | ICD-10-CM | POA: Diagnosis not present

## 2020-05-18 DIAGNOSIS — L89154 Pressure ulcer of sacral region, stage 4: Secondary | ICD-10-CM | POA: Diagnosis not present

## 2020-05-18 DIAGNOSIS — Z4800 Encounter for change or removal of nonsurgical wound dressing: Secondary | ICD-10-CM | POA: Diagnosis not present

## 2020-05-18 DIAGNOSIS — I5082 Biventricular heart failure: Secondary | ICD-10-CM | POA: Diagnosis not present

## 2020-05-18 DIAGNOSIS — G9341 Metabolic encephalopathy: Secondary | ICD-10-CM | POA: Diagnosis not present

## 2020-05-18 DIAGNOSIS — L03115 Cellulitis of right lower limb: Secondary | ICD-10-CM | POA: Diagnosis not present

## 2020-05-22 DIAGNOSIS — G9341 Metabolic encephalopathy: Secondary | ICD-10-CM | POA: Diagnosis not present

## 2020-05-22 DIAGNOSIS — I5082 Biventricular heart failure: Secondary | ICD-10-CM | POA: Diagnosis not present

## 2020-05-22 DIAGNOSIS — L89154 Pressure ulcer of sacral region, stage 4: Secondary | ICD-10-CM | POA: Diagnosis not present

## 2020-05-22 DIAGNOSIS — L03115 Cellulitis of right lower limb: Secondary | ICD-10-CM | POA: Diagnosis not present

## 2020-05-22 DIAGNOSIS — I130 Hypertensive heart and chronic kidney disease with heart failure and stage 1 through stage 4 chronic kidney disease, or unspecified chronic kidney disease: Secondary | ICD-10-CM | POA: Diagnosis not present

## 2020-05-22 DIAGNOSIS — Z4800 Encounter for change or removal of nonsurgical wound dressing: Secondary | ICD-10-CM | POA: Diagnosis not present

## 2020-05-24 DIAGNOSIS — G9341 Metabolic encephalopathy: Secondary | ICD-10-CM | POA: Diagnosis not present

## 2020-05-24 DIAGNOSIS — I5082 Biventricular heart failure: Secondary | ICD-10-CM | POA: Diagnosis not present

## 2020-05-24 DIAGNOSIS — I130 Hypertensive heart and chronic kidney disease with heart failure and stage 1 through stage 4 chronic kidney disease, or unspecified chronic kidney disease: Secondary | ICD-10-CM | POA: Diagnosis not present

## 2020-05-24 DIAGNOSIS — L03115 Cellulitis of right lower limb: Secondary | ICD-10-CM | POA: Diagnosis not present

## 2020-05-24 DIAGNOSIS — Z4800 Encounter for change or removal of nonsurgical wound dressing: Secondary | ICD-10-CM | POA: Diagnosis not present

## 2020-05-24 DIAGNOSIS — L89154 Pressure ulcer of sacral region, stage 4: Secondary | ICD-10-CM | POA: Diagnosis not present

## 2020-05-28 DIAGNOSIS — L03115 Cellulitis of right lower limb: Secondary | ICD-10-CM | POA: Diagnosis not present

## 2020-05-28 DIAGNOSIS — Z4800 Encounter for change or removal of nonsurgical wound dressing: Secondary | ICD-10-CM | POA: Diagnosis not present

## 2020-05-28 DIAGNOSIS — L89154 Pressure ulcer of sacral region, stage 4: Secondary | ICD-10-CM | POA: Diagnosis not present

## 2020-05-28 DIAGNOSIS — I130 Hypertensive heart and chronic kidney disease with heart failure and stage 1 through stage 4 chronic kidney disease, or unspecified chronic kidney disease: Secondary | ICD-10-CM | POA: Diagnosis not present

## 2020-05-28 DIAGNOSIS — G9341 Metabolic encephalopathy: Secondary | ICD-10-CM | POA: Diagnosis not present

## 2020-05-28 DIAGNOSIS — I5082 Biventricular heart failure: Secondary | ICD-10-CM | POA: Diagnosis not present

## 2020-05-29 DIAGNOSIS — I130 Hypertensive heart and chronic kidney disease with heart failure and stage 1 through stage 4 chronic kidney disease, or unspecified chronic kidney disease: Secondary | ICD-10-CM | POA: Diagnosis not present

## 2020-05-29 DIAGNOSIS — L03115 Cellulitis of right lower limb: Secondary | ICD-10-CM | POA: Diagnosis not present

## 2020-05-29 DIAGNOSIS — I5082 Biventricular heart failure: Secondary | ICD-10-CM | POA: Diagnosis not present

## 2020-05-29 DIAGNOSIS — G9341 Metabolic encephalopathy: Secondary | ICD-10-CM | POA: Diagnosis not present

## 2020-05-29 DIAGNOSIS — L89154 Pressure ulcer of sacral region, stage 4: Secondary | ICD-10-CM | POA: Diagnosis not present

## 2020-05-29 DIAGNOSIS — Z4800 Encounter for change or removal of nonsurgical wound dressing: Secondary | ICD-10-CM | POA: Diagnosis not present

## 2020-05-30 DIAGNOSIS — K602 Anal fissure, unspecified: Secondary | ICD-10-CM | POA: Diagnosis not present

## 2020-05-30 DIAGNOSIS — L97222 Non-pressure chronic ulcer of left calf with fat layer exposed: Secondary | ICD-10-CM | POA: Diagnosis not present

## 2020-05-30 DIAGNOSIS — E11622 Type 2 diabetes mellitus with other skin ulcer: Secondary | ICD-10-CM | POA: Diagnosis not present

## 2020-05-30 DIAGNOSIS — L89153 Pressure ulcer of sacral region, stage 3: Secondary | ICD-10-CM | POA: Diagnosis not present

## 2020-05-31 DIAGNOSIS — Z4800 Encounter for change or removal of nonsurgical wound dressing: Secondary | ICD-10-CM | POA: Diagnosis not present

## 2020-05-31 DIAGNOSIS — G9341 Metabolic encephalopathy: Secondary | ICD-10-CM | POA: Diagnosis not present

## 2020-05-31 DIAGNOSIS — I130 Hypertensive heart and chronic kidney disease with heart failure and stage 1 through stage 4 chronic kidney disease, or unspecified chronic kidney disease: Secondary | ICD-10-CM | POA: Diagnosis not present

## 2020-05-31 DIAGNOSIS — I5082 Biventricular heart failure: Secondary | ICD-10-CM | POA: Diagnosis not present

## 2020-05-31 DIAGNOSIS — L03115 Cellulitis of right lower limb: Secondary | ICD-10-CM | POA: Diagnosis not present

## 2020-05-31 DIAGNOSIS — L89154 Pressure ulcer of sacral region, stage 4: Secondary | ICD-10-CM | POA: Diagnosis not present

## 2020-06-01 DIAGNOSIS — L03115 Cellulitis of right lower limb: Secondary | ICD-10-CM | POA: Diagnosis not present

## 2020-06-01 DIAGNOSIS — Z4800 Encounter for change or removal of nonsurgical wound dressing: Secondary | ICD-10-CM | POA: Diagnosis not present

## 2020-06-01 DIAGNOSIS — I5082 Biventricular heart failure: Secondary | ICD-10-CM | POA: Diagnosis not present

## 2020-06-01 DIAGNOSIS — I130 Hypertensive heart and chronic kidney disease with heart failure and stage 1 through stage 4 chronic kidney disease, or unspecified chronic kidney disease: Secondary | ICD-10-CM | POA: Diagnosis not present

## 2020-06-01 DIAGNOSIS — G9341 Metabolic encephalopathy: Secondary | ICD-10-CM | POA: Diagnosis not present

## 2020-06-01 DIAGNOSIS — L89154 Pressure ulcer of sacral region, stage 4: Secondary | ICD-10-CM | POA: Diagnosis not present

## 2020-06-04 DIAGNOSIS — L89154 Pressure ulcer of sacral region, stage 4: Secondary | ICD-10-CM | POA: Diagnosis not present

## 2020-06-04 DIAGNOSIS — Z8673 Personal history of transient ischemic attack (TIA), and cerebral infarction without residual deficits: Secondary | ICD-10-CM | POA: Diagnosis not present

## 2020-06-04 DIAGNOSIS — I130 Hypertensive heart and chronic kidney disease with heart failure and stage 1 through stage 4 chronic kidney disease, or unspecified chronic kidney disease: Secondary | ICD-10-CM | POA: Diagnosis not present

## 2020-06-04 DIAGNOSIS — E11628 Type 2 diabetes mellitus with other skin complications: Secondary | ICD-10-CM | POA: Diagnosis not present

## 2020-06-04 DIAGNOSIS — R1312 Dysphagia, oropharyngeal phase: Secondary | ICD-10-CM | POA: Diagnosis not present

## 2020-06-04 DIAGNOSIS — I480 Paroxysmal atrial fibrillation: Secondary | ICD-10-CM | POA: Diagnosis not present

## 2020-06-04 DIAGNOSIS — R279 Unspecified lack of coordination: Secondary | ICD-10-CM | POA: Diagnosis not present

## 2020-06-04 DIAGNOSIS — E1140 Type 2 diabetes mellitus with diabetic neuropathy, unspecified: Secondary | ICD-10-CM | POA: Diagnosis not present

## 2020-06-04 DIAGNOSIS — N183 Chronic kidney disease, stage 3 unspecified: Secondary | ICD-10-CM | POA: Diagnosis not present

## 2020-06-04 DIAGNOSIS — Z4800 Encounter for change or removal of nonsurgical wound dressing: Secondary | ICD-10-CM | POA: Diagnosis not present

## 2020-06-04 DIAGNOSIS — E669 Obesity, unspecified: Secondary | ICD-10-CM | POA: Diagnosis not present

## 2020-06-04 DIAGNOSIS — M6281 Muscle weakness (generalized): Secondary | ICD-10-CM | POA: Diagnosis not present

## 2020-06-04 DIAGNOSIS — I409 Acute myocarditis, unspecified: Secondary | ICD-10-CM | POA: Diagnosis not present

## 2020-06-04 DIAGNOSIS — Z7901 Long term (current) use of anticoagulants: Secondary | ICD-10-CM | POA: Diagnosis not present

## 2020-06-04 DIAGNOSIS — E1139 Type 2 diabetes mellitus with other diabetic ophthalmic complication: Secondary | ICD-10-CM | POA: Diagnosis not present

## 2020-06-04 DIAGNOSIS — Z8616 Personal history of COVID-19: Secondary | ICD-10-CM | POA: Diagnosis not present

## 2020-06-04 DIAGNOSIS — I5082 Biventricular heart failure: Secondary | ICD-10-CM | POA: Diagnosis not present

## 2020-06-04 DIAGNOSIS — L03115 Cellulitis of right lower limb: Secondary | ICD-10-CM | POA: Diagnosis not present

## 2020-06-04 DIAGNOSIS — R269 Unspecified abnormalities of gait and mobility: Secondary | ICD-10-CM | POA: Diagnosis not present

## 2020-06-04 DIAGNOSIS — F419 Anxiety disorder, unspecified: Secondary | ICD-10-CM | POA: Diagnosis not present

## 2020-06-04 DIAGNOSIS — G9341 Metabolic encephalopathy: Secondary | ICD-10-CM | POA: Diagnosis not present

## 2020-06-04 DIAGNOSIS — E039 Hypothyroidism, unspecified: Secondary | ICD-10-CM | POA: Diagnosis not present

## 2020-06-04 DIAGNOSIS — G4733 Obstructive sleep apnea (adult) (pediatric): Secondary | ICD-10-CM | POA: Diagnosis not present

## 2020-06-05 DIAGNOSIS — I130 Hypertensive heart and chronic kidney disease with heart failure and stage 1 through stage 4 chronic kidney disease, or unspecified chronic kidney disease: Secondary | ICD-10-CM | POA: Diagnosis not present

## 2020-06-05 DIAGNOSIS — G9341 Metabolic encephalopathy: Secondary | ICD-10-CM | POA: Diagnosis not present

## 2020-06-05 DIAGNOSIS — Z4800 Encounter for change or removal of nonsurgical wound dressing: Secondary | ICD-10-CM | POA: Diagnosis not present

## 2020-06-05 DIAGNOSIS — I5082 Biventricular heart failure: Secondary | ICD-10-CM | POA: Diagnosis not present

## 2020-06-05 DIAGNOSIS — L03115 Cellulitis of right lower limb: Secondary | ICD-10-CM | POA: Diagnosis not present

## 2020-06-05 DIAGNOSIS — L89154 Pressure ulcer of sacral region, stage 4: Secondary | ICD-10-CM | POA: Diagnosis not present

## 2020-06-06 DIAGNOSIS — I130 Hypertensive heart and chronic kidney disease with heart failure and stage 1 through stage 4 chronic kidney disease, or unspecified chronic kidney disease: Secondary | ICD-10-CM | POA: Diagnosis not present

## 2020-06-06 DIAGNOSIS — I5082 Biventricular heart failure: Secondary | ICD-10-CM | POA: Diagnosis not present

## 2020-06-06 DIAGNOSIS — Z4800 Encounter for change or removal of nonsurgical wound dressing: Secondary | ICD-10-CM | POA: Diagnosis not present

## 2020-06-06 DIAGNOSIS — L89154 Pressure ulcer of sacral region, stage 4: Secondary | ICD-10-CM | POA: Diagnosis not present

## 2020-06-06 DIAGNOSIS — G9341 Metabolic encephalopathy: Secondary | ICD-10-CM | POA: Diagnosis not present

## 2020-06-06 DIAGNOSIS — L03115 Cellulitis of right lower limb: Secondary | ICD-10-CM | POA: Diagnosis not present

## 2020-06-08 DIAGNOSIS — L89154 Pressure ulcer of sacral region, stage 4: Secondary | ICD-10-CM | POA: Diagnosis not present

## 2020-06-08 DIAGNOSIS — L03115 Cellulitis of right lower limb: Secondary | ICD-10-CM | POA: Diagnosis not present

## 2020-06-08 DIAGNOSIS — I5082 Biventricular heart failure: Secondary | ICD-10-CM | POA: Diagnosis not present

## 2020-06-08 DIAGNOSIS — Z4800 Encounter for change or removal of nonsurgical wound dressing: Secondary | ICD-10-CM | POA: Diagnosis not present

## 2020-06-08 DIAGNOSIS — I130 Hypertensive heart and chronic kidney disease with heart failure and stage 1 through stage 4 chronic kidney disease, or unspecified chronic kidney disease: Secondary | ICD-10-CM | POA: Diagnosis not present

## 2020-06-08 DIAGNOSIS — G9341 Metabolic encephalopathy: Secondary | ICD-10-CM | POA: Diagnosis not present

## 2020-06-11 DIAGNOSIS — I130 Hypertensive heart and chronic kidney disease with heart failure and stage 1 through stage 4 chronic kidney disease, or unspecified chronic kidney disease: Secondary | ICD-10-CM | POA: Diagnosis not present

## 2020-06-11 DIAGNOSIS — Z4800 Encounter for change or removal of nonsurgical wound dressing: Secondary | ICD-10-CM | POA: Diagnosis not present

## 2020-06-11 DIAGNOSIS — L03115 Cellulitis of right lower limb: Secondary | ICD-10-CM | POA: Diagnosis not present

## 2020-06-11 DIAGNOSIS — L89154 Pressure ulcer of sacral region, stage 4: Secondary | ICD-10-CM | POA: Diagnosis not present

## 2020-06-11 DIAGNOSIS — G9341 Metabolic encephalopathy: Secondary | ICD-10-CM | POA: Diagnosis not present

## 2020-06-11 DIAGNOSIS — I5082 Biventricular heart failure: Secondary | ICD-10-CM | POA: Diagnosis not present

## 2020-06-13 DIAGNOSIS — Z4800 Encounter for change or removal of nonsurgical wound dressing: Secondary | ICD-10-CM | POA: Diagnosis not present

## 2020-06-13 DIAGNOSIS — L89154 Pressure ulcer of sacral region, stage 4: Secondary | ICD-10-CM | POA: Diagnosis not present

## 2020-06-13 DIAGNOSIS — I130 Hypertensive heart and chronic kidney disease with heart failure and stage 1 through stage 4 chronic kidney disease, or unspecified chronic kidney disease: Secondary | ICD-10-CM | POA: Diagnosis not present

## 2020-06-13 DIAGNOSIS — E11622 Type 2 diabetes mellitus with other skin ulcer: Secondary | ICD-10-CM | POA: Diagnosis not present

## 2020-06-13 DIAGNOSIS — L89153 Pressure ulcer of sacral region, stage 3: Secondary | ICD-10-CM | POA: Diagnosis not present

## 2020-06-13 DIAGNOSIS — L03115 Cellulitis of right lower limb: Secondary | ICD-10-CM | POA: Diagnosis not present

## 2020-06-13 DIAGNOSIS — G9341 Metabolic encephalopathy: Secondary | ICD-10-CM | POA: Diagnosis not present

## 2020-06-13 DIAGNOSIS — I5082 Biventricular heart failure: Secondary | ICD-10-CM | POA: Diagnosis not present

## 2020-06-13 DIAGNOSIS — K602 Anal fissure, unspecified: Secondary | ICD-10-CM | POA: Diagnosis not present

## 2020-06-14 ENCOUNTER — Encounter: Payer: Self-pay | Admitting: Podiatry

## 2020-06-14 ENCOUNTER — Ambulatory Visit (INDEPENDENT_AMBULATORY_CARE_PROVIDER_SITE_OTHER): Payer: Medicare Other | Admitting: Podiatry

## 2020-06-14 ENCOUNTER — Other Ambulatory Visit: Payer: Self-pay

## 2020-06-14 DIAGNOSIS — E1142 Type 2 diabetes mellitus with diabetic polyneuropathy: Secondary | ICD-10-CM

## 2020-06-14 DIAGNOSIS — M79675 Pain in left toe(s): Secondary | ICD-10-CM | POA: Diagnosis not present

## 2020-06-14 DIAGNOSIS — M79674 Pain in right toe(s): Secondary | ICD-10-CM

## 2020-06-14 DIAGNOSIS — B351 Tinea unguium: Secondary | ICD-10-CM

## 2020-06-15 DIAGNOSIS — G9341 Metabolic encephalopathy: Secondary | ICD-10-CM | POA: Diagnosis not present

## 2020-06-15 DIAGNOSIS — I130 Hypertensive heart and chronic kidney disease with heart failure and stage 1 through stage 4 chronic kidney disease, or unspecified chronic kidney disease: Secondary | ICD-10-CM | POA: Diagnosis not present

## 2020-06-15 DIAGNOSIS — I5082 Biventricular heart failure: Secondary | ICD-10-CM | POA: Diagnosis not present

## 2020-06-15 DIAGNOSIS — L89154 Pressure ulcer of sacral region, stage 4: Secondary | ICD-10-CM | POA: Diagnosis not present

## 2020-06-15 DIAGNOSIS — L03115 Cellulitis of right lower limb: Secondary | ICD-10-CM | POA: Diagnosis not present

## 2020-06-15 DIAGNOSIS — Z4800 Encounter for change or removal of nonsurgical wound dressing: Secondary | ICD-10-CM | POA: Diagnosis not present

## 2020-06-17 NOTE — Progress Notes (Signed)
Subjective: Debra Mcconnell presents today at risk foot care with history of diabetic neuropathy and painful mycotic nails b/l that are difficult to trim. Pain interferes with ambulation. Aggravating factors include wearing enclosed shoe gear. Pain is relieved with periodic professional debridement.  She voices no new pedal problems on today's visit.  Greig Right, MD is patient's PCP. Last visit was two months ago.  Her blood sugar was 116 mg/dl this morning.   Past Medical History:  Diagnosis Date  . Acquired hammer toes of both feet 01/22/2017  . Arthritis 11/29/2013  . Atrial flutter ([place]) 01/24/2014   Overview:  CHADS2 vasc score= 5  . B-complex deficiency 11/29/2013  . Cerebral artery occlusion with cerebral infarction ([place]) 12/04/2013   Overview:  STORY: MRI +ve Left frontoparietal infarction (LMCA). +AFib.`E1o3L`IMPRESSION: BP is under controlled. Continue home health. +AFib, con't Eliquis 5mg  bid. Will obtain old record from [facility] for review.  . Diabetic polyneuropathy associated with type 2 diabetes mellitus ([place]) 01/22/2017  . Essential hypertension 11/13/2015  . Hyperlipemia, retention 11/13/2015  . OSA (obstructive sleep apnea) 01/24/2014   Overview:  IMPRESSION: Will obtain old record for review and ask DME to d/l compliance and fax the result to me. F/u in 6 wks.  . Pre-ulcerative corn or callous 01/22/2017  . Stroke ([place]) 01/24/2014  . Thyroid disease 11/29/2013  . 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 Pneumonia due to COVID-19 virus 10/19/2019  . Acute respiratory failure with hypoxia ([place])   . Biventricular failure ([place])   . Pressure injury of skin 10/05/2019  . 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 Bilateral sensorineural hearing loss 05/17/2019  . Sudden right hearing loss 05/17/2019  . Comprehensive diabetic foot examination, type 2 DM, encounter for ([place])  11/11/2018  . Chronic anticoagulation 04/16/2017  . Acquired hammer toes of both feet 01/22/2017  . Diabetic polyneuropathy associated with type 2 diabetes mellitus ([place]) 01/22/2017  . Pre-ulcerative corn or callous 01/22/2017  . Essential hypertension 11/13/2015  . Hyperlipemia, retention 11/13/2015  . Atrial flutter ([place]) 01/24/2014  . OSA (obstructive sleep apnea) 01/24/2014  . Stroke ([place]) 01/24/2014  . Cerebral artery occlusion with cerebral infarction ([place]) 12/04/2013  . Arthritis 11/29/2013  . B-complex deficiency 11/29/2013  . Sleep apnea 11/29/2013  . Thyroid disease 11/29/2013  . 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 atorvastatin (LIPITOR) 40 MG tablet Take 40 mg by mouth daily.    Marland Kitchen buPROPion (WELLBUTRIN) 100 MG tablet Take 1 tablet (100 mg total) by mouth 3 (three) times daily. 90 tablet 0  . carvedilol (COREG) 3.125 MG tablet Take 1 tablet (3.125 mg total) by mouth 2 (two) times daily. 60 tablet 0  . Cholecalciferol (VITAMIN D) 2000 units CAPS Take 4,000 Units by mouth 2 (two) times daily.    . Cyanocobalamin (VITAMIN B 12 PO) Take 1,000 mcg by mouth daily.    Marland Kitchen ELIQUIS 5 MG TABS tablet TAKE 1 TABLET TWICE A DAY  (MUST SCHEDULE APPOINTMENT FOR FOLLOW UP BEFORE ANY   ADDITIONAL REFILLS) 180 tablet 3  . Exenatide ER 2 MG SRER Inject 2 mg into the skin once a week.     . ferrous sulfate 325 (65 FE) MG EC tablet Take 325 mg by mouth 2 (two) times daily with a  meal.    . fluconazole (DIFLUCAN) 150 MG tablet Take 150 mg by mouth once.    . furosemide (LASIX) 20 MG tablet Take 20 mg by mouth daily.    Marland Kitchen gabapentin (NEURONTIN) 250 MG/5ML solution Take 4 mLs (200 mg total) by mouth every 12 (twelve) hours. 240 mL 0  . gabapentin (NEURONTIN) 300 MG capsule     . glimepiride (AMARYL) 1 MG tablet Take 1-2 mg by mouth See admin instructions. Take 2 mg by  mouth in the morning and 1 mg in the evening    . hydrALAZINE (APRESOLINE) 25 MG tablet Take 25 mg by mouth 3 (three) times daily.    . hydrALAZINE (APRESOLINE) 50 MG tablet Take 50 mg by mouth 3 (three) times daily.    Marland Kitchen levothyroxine (SYNTHROID, LEVOTHROID) 175 MCG tablet Take 175 mcg by mouth daily.    Marland Kitchen nystatin ointment (MYCOSTATIN) APPLY TOPICALLY TO THE AFFECTED AREA TWICE DAILY    . omeprazole (PRILOSEC) 20 MG capsule Take 20 mg by mouth daily.     Glory Rosebush VERIO test strip daily.    . potassium chloride (KLOR-CON) 10 MEQ tablet Take 15 mEq by mouth daily.     No current facility-administered medications on file prior to visit.     Allergies  Allergen Reactions  . Gabapentin Other (See Comments)    Dizziness and drowsiness  . Lisinopril Cough    Objective: Debra Mcconnell is a pleasant 74 y.o. Caucasian female in NAD. AAO x 3.  There were no vitals filed for this visit.  Vascular Examination: Capillary refill time to digits <4 seconds b/l lower extremities. Faintly palpable DP pulse(s) b/l lower extremities. Nonpalpable PT pulse(s) b/l lower extremities. Pedal hair absent. Lower extremity skin temperature gradient within normal limits. Trace edema noted b/l lower extremities. Varicosities present b/l.  Dermatological Examination: Pedal skin with normal turgor, texture and tone bilaterally. No open wounds bilaterally. No interdigital macerations bilaterally. Toenails 1-5 b/l elongated, discolored, dystrophic, thickened, crumbly with subungual debris and tenderness to dorsal palpation.  Musculoskeletal: Normal muscle strength 5/5 to all lower extremity muscle groups bilaterally. No pain crepitus or joint limitation noted with ROM b/l. Hammertoes noted to the b/l lower extremities. Pes planus deformity noted b/l.   Neurological Examination: Pt has subjective symptoms of neuropathy. Protective sensation diminished with 10g monofilament b/l. Vibratory sensation diminished b/l.  Babinski reflex negative b/l. Clonus negative b/l.  Last A1c: Hemoglobin A1C Latest Ref Rng & Units 10/06/2019  HGBA1C 4.8 - 5.6 % 6.4(H)  Some recent data might be hidden   Assessment: 1. Pain due to onychomycosis of toenails of both feet   2. Diabetic polyneuropathy associated with type 2 diabetes mellitus ([place])    Plan: -Examined patient. -No new findings. No new orders. -Continue diabetic foot care principles. -Toenails 1-5 b/l were debrided in length and girth with sterile nail nippers and dremel without iatrogenic bleeding.  -Patient to report any pedal injuries to medical professional immediately. -Continue diabetic shoes daily. -Patient/POA to call should there be question/concern in the interim.  Return in about 3 months (around 09/14/2020).  Marzetta Board, DPM

## 2020-06-18 DIAGNOSIS — G9341 Metabolic encephalopathy: Secondary | ICD-10-CM | POA: Diagnosis not present

## 2020-06-18 DIAGNOSIS — Z4800 Encounter for change or removal of nonsurgical wound dressing: Secondary | ICD-10-CM | POA: Diagnosis not present

## 2020-06-18 DIAGNOSIS — I5082 Biventricular heart failure: Secondary | ICD-10-CM | POA: Diagnosis not present

## 2020-06-18 DIAGNOSIS — L89154 Pressure ulcer of sacral region, stage 4: Secondary | ICD-10-CM | POA: Diagnosis not present

## 2020-06-18 DIAGNOSIS — I130 Hypertensive heart and chronic kidney disease with heart failure and stage 1 through stage 4 chronic kidney disease, or unspecified chronic kidney disease: Secondary | ICD-10-CM | POA: Diagnosis not present

## 2020-06-18 DIAGNOSIS — L03115 Cellulitis of right lower limb: Secondary | ICD-10-CM | POA: Diagnosis not present

## 2020-06-20 DIAGNOSIS — L03115 Cellulitis of right lower limb: Secondary | ICD-10-CM | POA: Diagnosis not present

## 2020-06-20 DIAGNOSIS — I130 Hypertensive heart and chronic kidney disease with heart failure and stage 1 through stage 4 chronic kidney disease, or unspecified chronic kidney disease: Secondary | ICD-10-CM | POA: Diagnosis not present

## 2020-06-20 DIAGNOSIS — Z4800 Encounter for change or removal of nonsurgical wound dressing: Secondary | ICD-10-CM | POA: Diagnosis not present

## 2020-06-20 DIAGNOSIS — G9341 Metabolic encephalopathy: Secondary | ICD-10-CM | POA: Diagnosis not present

## 2020-06-20 DIAGNOSIS — I5082 Biventricular heart failure: Secondary | ICD-10-CM | POA: Diagnosis not present

## 2020-06-20 DIAGNOSIS — L89154 Pressure ulcer of sacral region, stage 4: Secondary | ICD-10-CM | POA: Diagnosis not present

## 2020-06-22 DIAGNOSIS — L03115 Cellulitis of right lower limb: Secondary | ICD-10-CM | POA: Diagnosis not present

## 2020-06-22 DIAGNOSIS — I5082 Biventricular heart failure: Secondary | ICD-10-CM | POA: Diagnosis not present

## 2020-06-22 DIAGNOSIS — G9341 Metabolic encephalopathy: Secondary | ICD-10-CM | POA: Diagnosis not present

## 2020-06-22 DIAGNOSIS — L89154 Pressure ulcer of sacral region, stage 4: Secondary | ICD-10-CM | POA: Diagnosis not present

## 2020-06-22 DIAGNOSIS — Z4800 Encounter for change or removal of nonsurgical wound dressing: Secondary | ICD-10-CM | POA: Diagnosis not present

## 2020-06-22 DIAGNOSIS — I130 Hypertensive heart and chronic kidney disease with heart failure and stage 1 through stage 4 chronic kidney disease, or unspecified chronic kidney disease: Secondary | ICD-10-CM | POA: Diagnosis not present

## 2020-06-26 DIAGNOSIS — L03115 Cellulitis of right lower limb: Secondary | ICD-10-CM | POA: Diagnosis not present

## 2020-06-26 DIAGNOSIS — I130 Hypertensive heart and chronic kidney disease with heart failure and stage 1 through stage 4 chronic kidney disease, or unspecified chronic kidney disease: Secondary | ICD-10-CM | POA: Diagnosis not present

## 2020-06-26 DIAGNOSIS — I5082 Biventricular heart failure: Secondary | ICD-10-CM | POA: Diagnosis not present

## 2020-06-26 DIAGNOSIS — L89154 Pressure ulcer of sacral region, stage 4: Secondary | ICD-10-CM | POA: Diagnosis not present

## 2020-06-26 DIAGNOSIS — G9341 Metabolic encephalopathy: Secondary | ICD-10-CM | POA: Diagnosis not present

## 2020-06-26 DIAGNOSIS — Z4800 Encounter for change or removal of nonsurgical wound dressing: Secondary | ICD-10-CM | POA: Diagnosis not present

## 2020-06-29 DIAGNOSIS — I5082 Biventricular heart failure: Secondary | ICD-10-CM | POA: Diagnosis not present

## 2020-06-29 DIAGNOSIS — G9341 Metabolic encephalopathy: Secondary | ICD-10-CM | POA: Diagnosis not present

## 2020-06-29 DIAGNOSIS — Z4800 Encounter for change or removal of nonsurgical wound dressing: Secondary | ICD-10-CM | POA: Diagnosis not present

## 2020-06-29 DIAGNOSIS — L89154 Pressure ulcer of sacral region, stage 4: Secondary | ICD-10-CM | POA: Diagnosis not present

## 2020-06-29 DIAGNOSIS — I130 Hypertensive heart and chronic kidney disease with heart failure and stage 1 through stage 4 chronic kidney disease, or unspecified chronic kidney disease: Secondary | ICD-10-CM | POA: Diagnosis not present

## 2020-06-29 DIAGNOSIS — L03115 Cellulitis of right lower limb: Secondary | ICD-10-CM | POA: Diagnosis not present

## 2020-06-30 DIAGNOSIS — I5082 Biventricular heart failure: Secondary | ICD-10-CM | POA: Diagnosis not present

## 2020-06-30 DIAGNOSIS — Z4800 Encounter for change or removal of nonsurgical wound dressing: Secondary | ICD-10-CM | POA: Diagnosis not present

## 2020-06-30 DIAGNOSIS — L03115 Cellulitis of right lower limb: Secondary | ICD-10-CM | POA: Diagnosis not present

## 2020-06-30 DIAGNOSIS — G9341 Metabolic encephalopathy: Secondary | ICD-10-CM | POA: Diagnosis not present

## 2020-06-30 DIAGNOSIS — L89154 Pressure ulcer of sacral region, stage 4: Secondary | ICD-10-CM | POA: Diagnosis not present

## 2020-06-30 DIAGNOSIS — I130 Hypertensive heart and chronic kidney disease with heart failure and stage 1 through stage 4 chronic kidney disease, or unspecified chronic kidney disease: Secondary | ICD-10-CM | POA: Diagnosis not present

## 2020-07-02 DIAGNOSIS — E1140 Type 2 diabetes mellitus with diabetic neuropathy, unspecified: Secondary | ICD-10-CM | POA: Diagnosis not present

## 2020-07-02 DIAGNOSIS — E1129 Type 2 diabetes mellitus with other diabetic kidney complication: Secondary | ICD-10-CM | POA: Diagnosis not present

## 2020-07-02 DIAGNOSIS — E113493 Type 2 diabetes mellitus with severe nonproliferative diabetic retinopathy without macular edema, bilateral: Secondary | ICD-10-CM | POA: Diagnosis not present

## 2020-07-02 DIAGNOSIS — I10 Essential (primary) hypertension: Secondary | ICD-10-CM | POA: Diagnosis not present

## 2020-07-02 DIAGNOSIS — R54 Age-related physical debility: Secondary | ICD-10-CM | POA: Diagnosis not present

## 2020-07-02 DIAGNOSIS — R600 Localized edema: Secondary | ICD-10-CM | POA: Diagnosis not present

## 2020-07-02 DIAGNOSIS — N183 Chronic kidney disease, stage 3 unspecified: Secondary | ICD-10-CM | POA: Diagnosis not present

## 2020-07-02 DIAGNOSIS — E038 Other specified hypothyroidism: Secondary | ICD-10-CM | POA: Diagnosis not present

## 2020-07-02 DIAGNOSIS — N1832 Chronic kidney disease, stage 3b: Secondary | ICD-10-CM | POA: Diagnosis not present

## 2020-07-02 DIAGNOSIS — Z6837 Body mass index (BMI) 37.0-37.9, adult: Secondary | ICD-10-CM | POA: Diagnosis not present

## 2020-07-02 DIAGNOSIS — L89152 Pressure ulcer of sacral region, stage 2: Secondary | ICD-10-CM | POA: Diagnosis not present

## 2020-07-02 DIAGNOSIS — E669 Obesity, unspecified: Secondary | ICD-10-CM | POA: Diagnosis not present

## 2020-07-03 DIAGNOSIS — L89154 Pressure ulcer of sacral region, stage 4: Secondary | ICD-10-CM | POA: Diagnosis not present

## 2020-07-03 DIAGNOSIS — I5082 Biventricular heart failure: Secondary | ICD-10-CM | POA: Diagnosis not present

## 2020-07-03 DIAGNOSIS — Z4800 Encounter for change or removal of nonsurgical wound dressing: Secondary | ICD-10-CM | POA: Diagnosis not present

## 2020-07-03 DIAGNOSIS — I130 Hypertensive heart and chronic kidney disease with heart failure and stage 1 through stage 4 chronic kidney disease, or unspecified chronic kidney disease: Secondary | ICD-10-CM | POA: Diagnosis not present

## 2020-07-03 DIAGNOSIS — G9341 Metabolic encephalopathy: Secondary | ICD-10-CM | POA: Diagnosis not present

## 2020-07-03 DIAGNOSIS — L03115 Cellulitis of right lower limb: Secondary | ICD-10-CM | POA: Diagnosis not present

## 2020-07-04 DIAGNOSIS — E039 Hypothyroidism, unspecified: Secondary | ICD-10-CM | POA: Diagnosis not present

## 2020-07-04 DIAGNOSIS — E1140 Type 2 diabetes mellitus with diabetic neuropathy, unspecified: Secondary | ICD-10-CM | POA: Diagnosis not present

## 2020-07-04 DIAGNOSIS — R279 Unspecified lack of coordination: Secondary | ICD-10-CM | POA: Diagnosis not present

## 2020-07-04 DIAGNOSIS — Z8616 Personal history of COVID-19: Secondary | ICD-10-CM | POA: Diagnosis not present

## 2020-07-04 DIAGNOSIS — E11628 Type 2 diabetes mellitus with other skin complications: Secondary | ICD-10-CM | POA: Diagnosis not present

## 2020-07-04 DIAGNOSIS — I5082 Biventricular heart failure: Secondary | ICD-10-CM | POA: Diagnosis not present

## 2020-07-04 DIAGNOSIS — F419 Anxiety disorder, unspecified: Secondary | ICD-10-CM | POA: Diagnosis not present

## 2020-07-04 DIAGNOSIS — Z8673 Personal history of transient ischemic attack (TIA), and cerebral infarction without residual deficits: Secondary | ICD-10-CM | POA: Diagnosis not present

## 2020-07-04 DIAGNOSIS — E1139 Type 2 diabetes mellitus with other diabetic ophthalmic complication: Secondary | ICD-10-CM | POA: Diagnosis not present

## 2020-07-04 DIAGNOSIS — G4733 Obstructive sleep apnea (adult) (pediatric): Secondary | ICD-10-CM | POA: Diagnosis not present

## 2020-07-04 DIAGNOSIS — E669 Obesity, unspecified: Secondary | ICD-10-CM | POA: Diagnosis not present

## 2020-07-04 DIAGNOSIS — R269 Unspecified abnormalities of gait and mobility: Secondary | ICD-10-CM | POA: Diagnosis not present

## 2020-07-04 DIAGNOSIS — M6281 Muscle weakness (generalized): Secondary | ICD-10-CM | POA: Diagnosis not present

## 2020-07-04 DIAGNOSIS — Z4800 Encounter for change or removal of nonsurgical wound dressing: Secondary | ICD-10-CM | POA: Diagnosis not present

## 2020-07-04 DIAGNOSIS — N183 Chronic kidney disease, stage 3 unspecified: Secondary | ICD-10-CM | POA: Diagnosis not present

## 2020-07-04 DIAGNOSIS — Z7901 Long term (current) use of anticoagulants: Secondary | ICD-10-CM | POA: Diagnosis not present

## 2020-07-04 DIAGNOSIS — L89153 Pressure ulcer of sacral region, stage 3: Secondary | ICD-10-CM | POA: Diagnosis not present

## 2020-07-04 DIAGNOSIS — I130 Hypertensive heart and chronic kidney disease with heart failure and stage 1 through stage 4 chronic kidney disease, or unspecified chronic kidney disease: Secondary | ICD-10-CM | POA: Diagnosis not present

## 2020-07-04 DIAGNOSIS — E11622 Type 2 diabetes mellitus with other skin ulcer: Secondary | ICD-10-CM | POA: Diagnosis not present

## 2020-07-04 DIAGNOSIS — L03115 Cellulitis of right lower limb: Secondary | ICD-10-CM | POA: Diagnosis not present

## 2020-07-04 DIAGNOSIS — R1312 Dysphagia, oropharyngeal phase: Secondary | ICD-10-CM | POA: Diagnosis not present

## 2020-07-04 DIAGNOSIS — I409 Acute myocarditis, unspecified: Secondary | ICD-10-CM | POA: Diagnosis not present

## 2020-07-04 DIAGNOSIS — K602 Anal fissure, unspecified: Secondary | ICD-10-CM | POA: Diagnosis not present

## 2020-07-04 DIAGNOSIS — L89154 Pressure ulcer of sacral region, stage 4: Secondary | ICD-10-CM | POA: Diagnosis not present

## 2020-07-04 DIAGNOSIS — G9341 Metabolic encephalopathy: Secondary | ICD-10-CM | POA: Diagnosis not present

## 2020-07-04 DIAGNOSIS — I480 Paroxysmal atrial fibrillation: Secondary | ICD-10-CM | POA: Diagnosis not present

## 2020-07-09 DIAGNOSIS — Z4800 Encounter for change or removal of nonsurgical wound dressing: Secondary | ICD-10-CM | POA: Diagnosis not present

## 2020-07-09 DIAGNOSIS — N183 Chronic kidney disease, stage 3 unspecified: Secondary | ICD-10-CM | POA: Diagnosis not present

## 2020-07-09 DIAGNOSIS — L03115 Cellulitis of right lower limb: Secondary | ICD-10-CM | POA: Diagnosis not present

## 2020-07-09 DIAGNOSIS — I5082 Biventricular heart failure: Secondary | ICD-10-CM | POA: Diagnosis not present

## 2020-07-09 DIAGNOSIS — L89154 Pressure ulcer of sacral region, stage 4: Secondary | ICD-10-CM | POA: Diagnosis not present

## 2020-07-09 DIAGNOSIS — I130 Hypertensive heart and chronic kidney disease with heart failure and stage 1 through stage 4 chronic kidney disease, or unspecified chronic kidney disease: Secondary | ICD-10-CM | POA: Diagnosis not present

## 2020-07-09 DIAGNOSIS — G9341 Metabolic encephalopathy: Secondary | ICD-10-CM | POA: Diagnosis not present

## 2020-07-10 DIAGNOSIS — G9341 Metabolic encephalopathy: Secondary | ICD-10-CM | POA: Diagnosis not present

## 2020-07-10 DIAGNOSIS — L89154 Pressure ulcer of sacral region, stage 4: Secondary | ICD-10-CM | POA: Diagnosis not present

## 2020-07-10 DIAGNOSIS — I130 Hypertensive heart and chronic kidney disease with heart failure and stage 1 through stage 4 chronic kidney disease, or unspecified chronic kidney disease: Secondary | ICD-10-CM | POA: Diagnosis not present

## 2020-07-10 DIAGNOSIS — I5082 Biventricular heart failure: Secondary | ICD-10-CM | POA: Diagnosis not present

## 2020-07-10 DIAGNOSIS — Z4800 Encounter for change or removal of nonsurgical wound dressing: Secondary | ICD-10-CM | POA: Diagnosis not present

## 2020-07-10 DIAGNOSIS — L03115 Cellulitis of right lower limb: Secondary | ICD-10-CM | POA: Diagnosis not present

## 2020-07-11 DIAGNOSIS — G9341 Metabolic encephalopathy: Secondary | ICD-10-CM | POA: Diagnosis not present

## 2020-07-11 DIAGNOSIS — L89154 Pressure ulcer of sacral region, stage 4: Secondary | ICD-10-CM | POA: Diagnosis not present

## 2020-07-11 DIAGNOSIS — L03115 Cellulitis of right lower limb: Secondary | ICD-10-CM | POA: Diagnosis not present

## 2020-07-11 DIAGNOSIS — I130 Hypertensive heart and chronic kidney disease with heart failure and stage 1 through stage 4 chronic kidney disease, or unspecified chronic kidney disease: Secondary | ICD-10-CM | POA: Diagnosis not present

## 2020-07-11 DIAGNOSIS — I5082 Biventricular heart failure: Secondary | ICD-10-CM | POA: Diagnosis not present

## 2020-07-11 DIAGNOSIS — Z4800 Encounter for change or removal of nonsurgical wound dressing: Secondary | ICD-10-CM | POA: Diagnosis not present

## 2020-07-13 DIAGNOSIS — L03115 Cellulitis of right lower limb: Secondary | ICD-10-CM | POA: Diagnosis not present

## 2020-07-13 DIAGNOSIS — I130 Hypertensive heart and chronic kidney disease with heart failure and stage 1 through stage 4 chronic kidney disease, or unspecified chronic kidney disease: Secondary | ICD-10-CM | POA: Diagnosis not present

## 2020-07-13 DIAGNOSIS — L89154 Pressure ulcer of sacral region, stage 4: Secondary | ICD-10-CM | POA: Diagnosis not present

## 2020-07-13 DIAGNOSIS — I5082 Biventricular heart failure: Secondary | ICD-10-CM | POA: Diagnosis not present

## 2020-07-13 DIAGNOSIS — Z4800 Encounter for change or removal of nonsurgical wound dressing: Secondary | ICD-10-CM | POA: Diagnosis not present

## 2020-07-13 DIAGNOSIS — G9341 Metabolic encephalopathy: Secondary | ICD-10-CM | POA: Diagnosis not present

## 2020-07-16 DIAGNOSIS — L89154 Pressure ulcer of sacral region, stage 4: Secondary | ICD-10-CM | POA: Diagnosis not present

## 2020-07-16 DIAGNOSIS — L03115 Cellulitis of right lower limb: Secondary | ICD-10-CM | POA: Diagnosis not present

## 2020-07-16 DIAGNOSIS — Z4800 Encounter for change or removal of nonsurgical wound dressing: Secondary | ICD-10-CM | POA: Diagnosis not present

## 2020-07-16 DIAGNOSIS — G9341 Metabolic encephalopathy: Secondary | ICD-10-CM | POA: Diagnosis not present

## 2020-07-16 DIAGNOSIS — I130 Hypertensive heart and chronic kidney disease with heart failure and stage 1 through stage 4 chronic kidney disease, or unspecified chronic kidney disease: Secondary | ICD-10-CM | POA: Diagnosis not present

## 2020-07-16 DIAGNOSIS — I5082 Biventricular heart failure: Secondary | ICD-10-CM | POA: Diagnosis not present

## 2020-07-17 DIAGNOSIS — L03115 Cellulitis of right lower limb: Secondary | ICD-10-CM | POA: Diagnosis not present

## 2020-07-17 DIAGNOSIS — I130 Hypertensive heart and chronic kidney disease with heart failure and stage 1 through stage 4 chronic kidney disease, or unspecified chronic kidney disease: Secondary | ICD-10-CM | POA: Diagnosis not present

## 2020-07-17 DIAGNOSIS — Z4800 Encounter for change or removal of nonsurgical wound dressing: Secondary | ICD-10-CM | POA: Diagnosis not present

## 2020-07-17 DIAGNOSIS — L89154 Pressure ulcer of sacral region, stage 4: Secondary | ICD-10-CM | POA: Diagnosis not present

## 2020-07-17 DIAGNOSIS — I5082 Biventricular heart failure: Secondary | ICD-10-CM | POA: Diagnosis not present

## 2020-07-17 DIAGNOSIS — G9341 Metabolic encephalopathy: Secondary | ICD-10-CM | POA: Diagnosis not present

## 2020-07-18 DIAGNOSIS — L89154 Pressure ulcer of sacral region, stage 4: Secondary | ICD-10-CM | POA: Diagnosis not present

## 2020-07-18 DIAGNOSIS — I5082 Biventricular heart failure: Secondary | ICD-10-CM | POA: Diagnosis not present

## 2020-07-18 DIAGNOSIS — Z4800 Encounter for change or removal of nonsurgical wound dressing: Secondary | ICD-10-CM | POA: Diagnosis not present

## 2020-07-18 DIAGNOSIS — G9341 Metabolic encephalopathy: Secondary | ICD-10-CM | POA: Diagnosis not present

## 2020-07-18 DIAGNOSIS — L03115 Cellulitis of right lower limb: Secondary | ICD-10-CM | POA: Diagnosis not present

## 2020-07-18 DIAGNOSIS — I130 Hypertensive heart and chronic kidney disease with heart failure and stage 1 through stage 4 chronic kidney disease, or unspecified chronic kidney disease: Secondary | ICD-10-CM | POA: Diagnosis not present

## 2020-07-24 DIAGNOSIS — Z4800 Encounter for change or removal of nonsurgical wound dressing: Secondary | ICD-10-CM | POA: Diagnosis not present

## 2020-07-24 DIAGNOSIS — L89154 Pressure ulcer of sacral region, stage 4: Secondary | ICD-10-CM | POA: Diagnosis not present

## 2020-07-24 DIAGNOSIS — I130 Hypertensive heart and chronic kidney disease with heart failure and stage 1 through stage 4 chronic kidney disease, or unspecified chronic kidney disease: Secondary | ICD-10-CM | POA: Diagnosis not present

## 2020-07-24 DIAGNOSIS — L03115 Cellulitis of right lower limb: Secondary | ICD-10-CM | POA: Diagnosis not present

## 2020-07-24 DIAGNOSIS — G9341 Metabolic encephalopathy: Secondary | ICD-10-CM | POA: Diagnosis not present

## 2020-07-24 DIAGNOSIS — I5082 Biventricular heart failure: Secondary | ICD-10-CM | POA: Diagnosis not present

## 2020-07-26 DIAGNOSIS — G9341 Metabolic encephalopathy: Secondary | ICD-10-CM | POA: Diagnosis not present

## 2020-07-26 DIAGNOSIS — I130 Hypertensive heart and chronic kidney disease with heart failure and stage 1 through stage 4 chronic kidney disease, or unspecified chronic kidney disease: Secondary | ICD-10-CM | POA: Diagnosis not present

## 2020-07-26 DIAGNOSIS — I5082 Biventricular heart failure: Secondary | ICD-10-CM | POA: Diagnosis not present

## 2020-07-26 DIAGNOSIS — L89154 Pressure ulcer of sacral region, stage 4: Secondary | ICD-10-CM | POA: Diagnosis not present

## 2020-07-26 DIAGNOSIS — Z4800 Encounter for change or removal of nonsurgical wound dressing: Secondary | ICD-10-CM | POA: Diagnosis not present

## 2020-07-26 DIAGNOSIS — L03115 Cellulitis of right lower limb: Secondary | ICD-10-CM | POA: Diagnosis not present

## 2020-07-27 DIAGNOSIS — I5082 Biventricular heart failure: Secondary | ICD-10-CM | POA: Diagnosis not present

## 2020-07-27 DIAGNOSIS — L03115 Cellulitis of right lower limb: Secondary | ICD-10-CM | POA: Diagnosis not present

## 2020-07-27 DIAGNOSIS — L89154 Pressure ulcer of sacral region, stage 4: Secondary | ICD-10-CM | POA: Diagnosis not present

## 2020-07-27 DIAGNOSIS — I130 Hypertensive heart and chronic kidney disease with heart failure and stage 1 through stage 4 chronic kidney disease, or unspecified chronic kidney disease: Secondary | ICD-10-CM | POA: Diagnosis not present

## 2020-07-27 DIAGNOSIS — Z4800 Encounter for change or removal of nonsurgical wound dressing: Secondary | ICD-10-CM | POA: Diagnosis not present

## 2020-07-27 DIAGNOSIS — G9341 Metabolic encephalopathy: Secondary | ICD-10-CM | POA: Diagnosis not present

## 2020-07-30 DIAGNOSIS — G9341 Metabolic encephalopathy: Secondary | ICD-10-CM | POA: Diagnosis not present

## 2020-07-30 DIAGNOSIS — L89154 Pressure ulcer of sacral region, stage 4: Secondary | ICD-10-CM | POA: Diagnosis not present

## 2020-07-30 DIAGNOSIS — L03115 Cellulitis of right lower limb: Secondary | ICD-10-CM | POA: Diagnosis not present

## 2020-07-30 DIAGNOSIS — Z4800 Encounter for change or removal of nonsurgical wound dressing: Secondary | ICD-10-CM | POA: Diagnosis not present

## 2020-07-30 DIAGNOSIS — I130 Hypertensive heart and chronic kidney disease with heart failure and stage 1 through stage 4 chronic kidney disease, or unspecified chronic kidney disease: Secondary | ICD-10-CM | POA: Diagnosis not present

## 2020-07-30 DIAGNOSIS — I5082 Biventricular heart failure: Secondary | ICD-10-CM | POA: Diagnosis not present

## 2020-07-31 DIAGNOSIS — G9341 Metabolic encephalopathy: Secondary | ICD-10-CM | POA: Diagnosis not present

## 2020-07-31 DIAGNOSIS — I5082 Biventricular heart failure: Secondary | ICD-10-CM | POA: Diagnosis not present

## 2020-07-31 DIAGNOSIS — I130 Hypertensive heart and chronic kidney disease with heart failure and stage 1 through stage 4 chronic kidney disease, or unspecified chronic kidney disease: Secondary | ICD-10-CM | POA: Diagnosis not present

## 2020-07-31 DIAGNOSIS — L89154 Pressure ulcer of sacral region, stage 4: Secondary | ICD-10-CM | POA: Diagnosis not present

## 2020-07-31 DIAGNOSIS — L03115 Cellulitis of right lower limb: Secondary | ICD-10-CM | POA: Diagnosis not present

## 2020-07-31 DIAGNOSIS — Z4800 Encounter for change or removal of nonsurgical wound dressing: Secondary | ICD-10-CM | POA: Diagnosis not present

## 2020-08-13 DIAGNOSIS — Z23 Encounter for immunization: Secondary | ICD-10-CM | POA: Diagnosis not present

## 2020-08-13 DIAGNOSIS — I10 Essential (primary) hypertension: Secondary | ICD-10-CM | POA: Diagnosis not present

## 2020-08-13 DIAGNOSIS — I5042 Chronic combined systolic (congestive) and diastolic (congestive) heart failure: Secondary | ICD-10-CM | POA: Diagnosis not present

## 2020-08-13 DIAGNOSIS — Z0000 Encounter for general adult medical examination without abnormal findings: Secondary | ICD-10-CM | POA: Diagnosis not present

## 2020-08-13 DIAGNOSIS — E113493 Type 2 diabetes mellitus with severe nonproliferative diabetic retinopathy without macular edema, bilateral: Secondary | ICD-10-CM | POA: Diagnosis not present

## 2020-08-13 DIAGNOSIS — N183 Chronic kidney disease, stage 3 unspecified: Secondary | ICD-10-CM | POA: Diagnosis not present

## 2020-08-13 DIAGNOSIS — N1832 Chronic kidney disease, stage 3b: Secondary | ICD-10-CM | POA: Diagnosis not present

## 2020-08-13 DIAGNOSIS — E669 Obesity, unspecified: Secondary | ICD-10-CM | POA: Diagnosis not present

## 2020-08-13 DIAGNOSIS — E538 Deficiency of other specified B group vitamins: Secondary | ICD-10-CM | POA: Diagnosis not present

## 2020-08-13 DIAGNOSIS — E038 Other specified hypothyroidism: Secondary | ICD-10-CM | POA: Diagnosis not present

## 2020-08-13 DIAGNOSIS — E7800 Pure hypercholesterolemia, unspecified: Secondary | ICD-10-CM | POA: Diagnosis not present

## 2020-08-13 DIAGNOSIS — Z6834 Body mass index (BMI) 34.0-34.9, adult: Secondary | ICD-10-CM | POA: Diagnosis not present

## 2020-08-17 DIAGNOSIS — I340 Nonrheumatic mitral (valve) insufficiency: Secondary | ICD-10-CM | POA: Diagnosis not present

## 2020-08-17 DIAGNOSIS — I361 Nonrheumatic tricuspid (valve) insufficiency: Secondary | ICD-10-CM | POA: Diagnosis not present

## 2020-08-17 DIAGNOSIS — I6990 Unspecified sequelae of unspecified cerebrovascular disease: Secondary | ICD-10-CM | POA: Diagnosis not present

## 2020-08-17 DIAGNOSIS — I5042 Chronic combined systolic (congestive) and diastolic (congestive) heart failure: Secondary | ICD-10-CM | POA: Diagnosis not present

## 2020-08-30 DIAGNOSIS — L02235 Carbuncle of perineum: Secondary | ICD-10-CM | POA: Diagnosis not present

## 2020-09-13 ENCOUNTER — Ambulatory Visit (INDEPENDENT_AMBULATORY_CARE_PROVIDER_SITE_OTHER): Payer: Medicare Other | Admitting: Podiatry

## 2020-09-13 ENCOUNTER — Other Ambulatory Visit: Payer: Self-pay

## 2020-09-13 DIAGNOSIS — B351 Tinea unguium: Secondary | ICD-10-CM | POA: Diagnosis not present

## 2020-09-13 DIAGNOSIS — M6240 Contracture of muscle, unspecified site: Secondary | ICD-10-CM

## 2020-09-13 DIAGNOSIS — M2041 Other hammer toe(s) (acquired), right foot: Secondary | ICD-10-CM

## 2020-09-13 DIAGNOSIS — E1142 Type 2 diabetes mellitus with diabetic polyneuropathy: Secondary | ICD-10-CM

## 2020-09-13 DIAGNOSIS — Z7901 Long term (current) use of anticoagulants: Secondary | ICD-10-CM

## 2020-09-13 DIAGNOSIS — L97511 Non-pressure chronic ulcer of other part of right foot limited to breakdown of skin: Secondary | ICD-10-CM

## 2020-09-13 DIAGNOSIS — M79675 Pain in left toe(s): Secondary | ICD-10-CM

## 2020-09-13 NOTE — Progress Notes (Signed)
  Subjective:  Patient ID: Debra Mcconnell, female    DOB: 15-Aug-1946,  MRN: [id number]  No chief complaint on file.   75 y.o. female presents for at risk foot care. History confirmed with patient. Noticed new sore to the right 3rd toe yesterday/today. States it is painful to the touch.  Objective:  Physical Exam: warm, good capillary refill, nail exam onychomycosis of the toenails, no trophic changes or ulcerative lesions. DP pulses palpable, PT pulses palpable and protective sensation absent Right Foot: Right 3rd toe hammertoe, flexible, with PIPJ ulceration and pre-ulcer distal tip.   No images are attached to the encounter.  Assessment:   1. Right foot ulcer, limited to breakdown of skin (HCC)   2. Onychomycosis   3. Diabetic polyneuropathy associated with type 2 diabetes mellitus (HCC)   4. Hammer toe of right foot   5. Pain due to onychomycosis of toenails of both feet   6. Chronic anticoagulation   7. Contracture of tendon sheath      Plan:  Patient was evaluated and treated and all questions answered.  Onychomycosis and Diabetes, Coag Defect -Patient is diabetic with a qualifying condition for at risk foot care.  Procedure: Nail Debridement Type of Debridement: manual, sharp debridement. Instrumentation: Nail nipper, rotary burr. Number of Nails: 10  Hammertoe Right 3rd toe toe with PIPJ ulcer -Educated on etiology -Discussed daily dressing with abx ointment and band-aid -Would benefit from surgical intervention consisting of flexor tenotomy. Plan for procedure next visit. While classified as a major procedure this can be done minimally in the office. -Risk factors: DM, coag defect  No follow-ups on file.

## 2020-09-14 DIAGNOSIS — M8589 Other specified disorders of bone density and structure, multiple sites: Secondary | ICD-10-CM | POA: Diagnosis not present

## 2020-09-14 DIAGNOSIS — M810 Age-related osteoporosis without current pathological fracture: Secondary | ICD-10-CM | POA: Diagnosis not present

## 2020-09-14 DIAGNOSIS — Z1231 Encounter for screening mammogram for malignant neoplasm of breast: Secondary | ICD-10-CM | POA: Diagnosis not present

## 2020-09-19 DIAGNOSIS — M810 Age-related osteoporosis without current pathological fracture: Secondary | ICD-10-CM | POA: Diagnosis not present

## 2020-09-19 DIAGNOSIS — H6122 Impacted cerumen, left ear: Secondary | ICD-10-CM | POA: Diagnosis not present

## 2020-09-19 DIAGNOSIS — I5022 Chronic systolic (congestive) heart failure: Secondary | ICD-10-CM | POA: Diagnosis not present

## 2020-09-26 ENCOUNTER — Telehealth: Payer: Self-pay | Admitting: *Deleted

## 2020-09-26 NOTE — Telephone Encounter (Signed)
-----   Message from Evelina Bucy, DPM sent at 09/25/2020  5:51 PM EST ----- She is able to be full weightbearing in the shoe provided in the office.  She will only put antibiotic ointment and a Band-Aid on the toe daily.  Please inform. ----- Message ----- From: Viviana Simpler, PMAC Sent: 09/25/2020   5:16 PM EST To: Evelina Bucy, DPM  Patient's caregiver called and stated that she was wandering what the after care would be and if the patient was going to be ok walking and getting around and the patient's caregiver is patsy [phone]. Lattie Haw

## 2020-09-26 NOTE — Telephone Encounter (Signed)
Called and spoke with [name]-caregiver and stated that the patient would be able to get around with the surgical shoe that will be provided from the office and relayed the message per Dr March Rummage. Lattie Haw

## 2020-09-27 ENCOUNTER — Encounter: Payer: Self-pay | Admitting: Podiatry

## 2020-09-27 ENCOUNTER — Other Ambulatory Visit: Payer: Self-pay

## 2020-09-27 ENCOUNTER — Ambulatory Visit (INDEPENDENT_AMBULATORY_CARE_PROVIDER_SITE_OTHER): Payer: Medicare Other | Admitting: Podiatry

## 2020-09-27 DIAGNOSIS — M2041 Other hammer toe(s) (acquired), right foot: Secondary | ICD-10-CM | POA: Diagnosis not present

## 2020-09-27 DIAGNOSIS — L02611 Cutaneous abscess of right foot: Secondary | ICD-10-CM | POA: Diagnosis not present

## 2020-09-27 DIAGNOSIS — L03031 Cellulitis of right toe: Secondary | ICD-10-CM | POA: Diagnosis not present

## 2020-09-27 DIAGNOSIS — M2042 Other hammer toe(s) (acquired), left foot: Secondary | ICD-10-CM

## 2020-09-27 MED ORDER — DOXYCYCLINE HYCLATE 100 MG PO TABS: 100.0000 mg | ORAL_TABLET | Freq: Two times a day (BID) | ORAL | 0 refills | Status: DC

## 2020-09-27 NOTE — Patient Instructions (Signed)
Remove dressing tomorrow. Apply ointment to procedure site on the undersurface of the toe, and the sore on the top of the toe. Dress the toe with band-aids or gauze and tape. Change daily.

## 2020-09-27 NOTE — Progress Notes (Signed)
  Subjective:  Patient ID: Debra Mcconnell, female    DOB: [birth year],  MRN: [id number]  Chief Complaint  Patient presents with  . Foot Problem    I am here to get the procedure done on the 3rd toe right    75 y.o. female returns today for planned flexor tenotomy of the right 3rd digit. The toe has become more red since last visit.  Objective:  There were no vitals filed for this visit.  General AA&O x3. Normal mood and affect.  Vascular Pedal pulses palpable.  Neurologic Epicritic sensation grossly intact.  Dermatologic Pre-ulcerative callus at the tip of the right, 3rd toe. Small dorsal ulcer at the PIPJ. Warmth and erythema noted to the toe.  Orthopedic: Semi-reducible hammertoe deformity right, 3rd toe    Assessment & Plan:  Patient was evaluated and treated and all questions answered.  Hammertoe right 3rd toe with pre-ulcerative callus -Flexor tenotomy as below. -Advised to remove the dressing in 24 hours and apply a band-aid and triple abx ointment every day thereafter. -Rx doxycycline for cellulitis of the digit and post-op prophylaxis. -While the toe has some minor infection today, I am concerned not proceeding with tenotomy today could be worse than the increased risks of proceeding under these conditions. Consent form reviewed and signed for the procedure.  Procedure: Flexor Tenotomy Indication for Procedure: toe with semi-reducible hammertoe with distal tip ulceration. Flexor tenotomy indicated to alleviate contracture, reduce pressure, and enhance healing of the ulceration. Location: right, 3rd toe Anesthesia: Lidocaine 1% plain; 1.5 mL and Marcaine 0.5% plain; 1.5 mL digital block Instrumentation: 18 g needle  Technique: The toe was anesthetized as above and prepped in the usual fashion. The toe was exsanquinated and a tourniquet was secured at the base of the toe. An 18g needle was then used to percutaneously release the flexor tendon at the plantar surface of the toe  with noted release of the hammertoe deformity. The incision was then dressed with antibiotic ointment and band-aid. Compression splint dressing applied. Patient tolerated the procedure well. Dressing: Dry, sterile, compression dressing. Disposition: Patient tolerated procedure well. Patient to return in 1 week for follow-up.      No follow-ups on file.

## 2020-10-04 ENCOUNTER — Ambulatory Visit (INDEPENDENT_AMBULATORY_CARE_PROVIDER_SITE_OTHER): Payer: Medicare Other | Admitting: Podiatry

## 2020-10-04 ENCOUNTER — Other Ambulatory Visit: Payer: Self-pay

## 2020-10-04 DIAGNOSIS — M2041 Other hammer toe(s) (acquired), right foot: Secondary | ICD-10-CM

## 2020-10-04 DIAGNOSIS — M2042 Other hammer toe(s) (acquired), left foot: Secondary | ICD-10-CM

## 2020-10-04 DIAGNOSIS — Z9889 Other specified postprocedural states: Secondary | ICD-10-CM

## 2020-10-05 ENCOUNTER — Encounter: Payer: Self-pay | Admitting: Cardiology

## 2020-10-08 NOTE — Progress Notes (Signed)
  Subjective:  Patient ID: Debra Mcconnell, female    DOB: [birth year],  MRN: [id number]  No chief complaint on file.   DOS: 09/27/20 Procedure: Flexor tenotomy right third toe  75 y.o. female presents with the above complaint. History confirmed with patient.  Objective:  Physical Exam: no tenderness at the surgical site, local edema noted and toes rectus. Incision: well healed, reduced dorsal HPK  Assessment:   1. Acquired hammer toes of both feet   2. Post-operative state     Plan:  Patient was evaluated and treated and all questions answered.  Post-operative State -Okay to transition out of surgical shoe.  Continue normal shoe gear.  The third toe lesion was courtesy pared the patient will continue to monitor for signs of continued ulceration.  Follow-up in 1 month for recheck  No follow-ups on file.

## 2020-10-25 ENCOUNTER — Other Ambulatory Visit: Payer: Self-pay

## 2020-10-25 ENCOUNTER — Ambulatory Visit (INDEPENDENT_AMBULATORY_CARE_PROVIDER_SITE_OTHER): Payer: Medicare Other

## 2020-10-25 ENCOUNTER — Ambulatory Visit (INDEPENDENT_AMBULATORY_CARE_PROVIDER_SITE_OTHER): Payer: Medicare Other | Admitting: Podiatry

## 2020-10-25 DIAGNOSIS — M86171 Other acute osteomyelitis, right ankle and foot: Secondary | ICD-10-CM

## 2020-10-25 DIAGNOSIS — L03031 Cellulitis of right toe: Secondary | ICD-10-CM

## 2020-10-25 DIAGNOSIS — L02611 Cutaneous abscess of right foot: Secondary | ICD-10-CM | POA: Diagnosis not present

## 2020-10-25 MED ORDER — DOXYCYCLINE HYCLATE 100 MG PO TABS: 100.0000 mg | ORAL_TABLET | Freq: Two times a day (BID) | ORAL | 0 refills | Status: DC

## 2020-10-29 ENCOUNTER — Encounter: Payer: Self-pay | Admitting: Podiatry

## 2020-10-29 ENCOUNTER — Other Ambulatory Visit: Payer: Self-pay

## 2020-10-29 ENCOUNTER — Ambulatory Visit (INDEPENDENT_AMBULATORY_CARE_PROVIDER_SITE_OTHER): Payer: Medicare Other | Admitting: Podiatry

## 2020-10-29 DIAGNOSIS — M86171 Other acute osteomyelitis, right ankle and foot: Secondary | ICD-10-CM

## 2020-10-29 DIAGNOSIS — M542 Cervicalgia: Secondary | ICD-10-CM | POA: Diagnosis not present

## 2020-10-29 DIAGNOSIS — L02611 Cutaneous abscess of right foot: Secondary | ICD-10-CM | POA: Diagnosis not present

## 2020-10-29 DIAGNOSIS — L03031 Cellulitis of right toe: Secondary | ICD-10-CM

## 2020-10-29 DIAGNOSIS — E1140 Type 2 diabetes mellitus with diabetic neuropathy, unspecified: Secondary | ICD-10-CM | POA: Diagnosis not present

## 2020-10-29 NOTE — Progress Notes (Signed)
  Subjective:  Patient ID: Debra Mcconnell, female    DOB: 1945-11-15,  MRN: [id number]  Chief Complaint  Patient presents with  . Foot Ulcer    I am doing a little better and there is still some draining on the 3rd toe right    DOS: 09/27/20 Procedure: Flexor tenotomy right third toe  75 y.o. female presents with the above complaint. History confirmed with patient.  Objective:  Physical Exam: no tenderness at the surgical site, local edema noted and toes rectus. Incision: well healed, dorsal ulcer right 3rd toe with warmth, erythema, soft eschar plug noted to wound. SS drinage, no purulence. +Probe to bone. Healed distal callus.  Assessment:   1. Cellulitis and abscess of toe of right foot   2. Acute osteomyelitis of right ankle or foot ([place])     Plan:  Patient was evaluated and treated and all questions answered.  Ulcer toe right -Improving somewhat -Continue surgical shoe -Wound minimally debrided, cleansed, and packed with prisma -Order MRI to eval for osteomyelitis -Advised to proceed directly to ED should wound worsen, become more red/swollen/more pain/drainage or purulence.  Return in about 1 week (around 11/05/2020) for MRI review.

## 2020-11-05 DIAGNOSIS — M86171 Other acute osteomyelitis, right ankle and foot: Secondary | ICD-10-CM | POA: Diagnosis not present

## 2020-11-05 DIAGNOSIS — L03031 Cellulitis of right toe: Secondary | ICD-10-CM | POA: Diagnosis not present

## 2020-11-05 DIAGNOSIS — M7989 Other specified soft tissue disorders: Secondary | ICD-10-CM | POA: Diagnosis not present

## 2020-11-05 DIAGNOSIS — L02611 Cutaneous abscess of right foot: Secondary | ICD-10-CM | POA: Diagnosis not present

## 2020-11-05 DIAGNOSIS — M6258 Muscle wasting and atrophy, not elsewhere classified, other site: Secondary | ICD-10-CM | POA: Diagnosis not present

## 2020-11-05 NOTE — Progress Notes (Signed)
  Subjective:  Patient ID: Debra Mcconnell, female    DOB: 1946/06/25,  MRN: [id number]  Chief Complaint  Patient presents with  . tenotomy    F/U Rt 3rd toe flexor tenotomy -pt states," seems okay." -w/ bleeding, redness, swelling and pain; 5/10 Tx: band aid and abx ointment    DOS: 09/27/20 Procedure: Flexor tenotomy right third toe  75 y.o. female presents with the above complaint. History confirmed with patient.  Objective:  Physical Exam: no tenderness at the surgical site, local edema noted and toes rectus. Incision: well healed, reduced dorsal ulcer but with cellulitis and probe to bone about the ulcer.  Assessment:   1. Cellulitis and abscess of toe of right foot   2. Acute osteomyelitis of right ankle or foot ([place])     Plan:  Patient was evaluated and treated and all questions answered.  Post-operative State -XR taken s/o OM of the digit. Discussed findings with patient. -At this point will trial abx. Consider surgical debridement and or amputation should clinical findings warrant. -The toe does have some signs of cellulitis at the PIPJ. Refill doxycycline today. Dress daily with abx ointment and band-aid -F/u in 1 week.

## 2020-11-06 ENCOUNTER — Telehealth: Payer: Self-pay | Admitting: Podiatry

## 2020-11-06 NOTE — Telephone Encounter (Signed)
Called patient and went over the MRI results s/o OM of middle and proximal phalanx. She overall feels like the toe is doing better. For this reason I think it is reasonable to evaluate on Thursday at her scheduled appointment and discuss the next steps at that time.  We discussed that should she notice signs of worsening she is to present directly to the emergency room.  Patient verbalized understanding.

## 2020-11-08 ENCOUNTER — Ambulatory Visit (INDEPENDENT_AMBULATORY_CARE_PROVIDER_SITE_OTHER): Payer: Medicare Other | Admitting: Podiatry

## 2020-11-08 ENCOUNTER — Other Ambulatory Visit: Payer: Self-pay

## 2020-11-08 ENCOUNTER — Encounter: Payer: Self-pay | Admitting: Podiatry

## 2020-11-08 DIAGNOSIS — E1142 Type 2 diabetes mellitus with diabetic polyneuropathy: Secondary | ICD-10-CM

## 2020-11-08 DIAGNOSIS — L03031 Cellulitis of right toe: Secondary | ICD-10-CM | POA: Diagnosis not present

## 2020-11-08 DIAGNOSIS — L02611 Cutaneous abscess of right foot: Secondary | ICD-10-CM

## 2020-11-08 DIAGNOSIS — M2041 Other hammer toe(s) (acquired), right foot: Secondary | ICD-10-CM

## 2020-11-08 DIAGNOSIS — M86171 Other acute osteomyelitis, right ankle and foot: Secondary | ICD-10-CM

## 2020-11-08 MED ORDER — DOXYCYCLINE HYCLATE 100 MG PO TABS: 100.0000 mg | ORAL_TABLET | Freq: Two times a day (BID) | ORAL | 0 refills | Status: DC

## 2020-11-08 NOTE — Progress Notes (Signed)
  Subjective:  Patient ID: Debra Mcconnell, female    DOB: 1946/08/28,  MRN: [id number]  Chief Complaint  Patient presents with  . Foot Ulcer    The 3rd toe on the right is better   . Foot Problem    I am here to get the results of the MRI    DOS: 09/27/20 Procedure: Flexor tenotomy right third toe  75 y.o. female presents with the above complaint. History confirmed with patient. Thinks the toe is doing a little better, not as red. Denies N/V/F/Ch.  Objective:  Physical Exam: no tenderness at the surgical site, local edema noted and toes rectus. Incision: dorsal ulcer right 3rd toe without active warmth, decreased erythema; fluctuant area with central dell over the PIPJ concerning for continued erosion. A small amount of purulence and bone fragments are able to be expressed.  Assessment:   1. Cellulitis and abscess of toe of right foot   2. Acute osteomyelitis of right ankle or foot ([place])   3. Acquired hammer toes of both feet   4. Diabetic polyneuropathy associated with type 2 diabetes mellitus ([employer])    Plan:  Patient was evaluated and treated and all questions answered.  Ulcer toe right -Does appear slightly improved, with less cellulitis, but with worsening erosion of the toe noted. Some fluctuance noted today. I&D as below -MRI reviewed with patient confirming OM. -Patient has failed all conservative therapy and wishes to proceed with surgical intervention. All risks, benefits, and alternatives discussed with patient. No guarantees given. Consent reviewed and signed by patient. -Planned procedures: right 3rd toe amputation -ASA 3 - Patient with moderate systemic disease with functional limitations -Post-op anticoagulation: chemoprophylaxis not indicated   Procedure: Incision and Drainage of abscess Anesthesia: Lidocaine 1% plain, 3 cc Instrumentation: 15 blade Technique: Following anesthesia and sterile skin prep, an incision was made over the fluctuant area. A small amount  of necrotic bone and purulence was expressible. The wound was irrigated, cleansed, and sterilely dressed. Dressing: Dry, sterile, compression dressing. Disposition: Patient tolerated procedure well.  Pt advised to proceed directly to ED should wound worsen, become more red/swollen/more pain/drainage or purulence.  Return in about 2 weeks (around 11/22/2020).

## 2020-11-13 DIAGNOSIS — E038 Other specified hypothyroidism: Secondary | ICD-10-CM | POA: Diagnosis not present

## 2020-11-13 DIAGNOSIS — I4892 Unspecified atrial flutter: Secondary | ICD-10-CM | POA: Diagnosis not present

## 2020-11-13 DIAGNOSIS — E669 Obesity, unspecified: Secondary | ICD-10-CM | POA: Diagnosis not present

## 2020-11-13 DIAGNOSIS — E7800 Pure hypercholesterolemia, unspecified: Secondary | ICD-10-CM | POA: Diagnosis not present

## 2020-11-13 DIAGNOSIS — N1832 Chronic kidney disease, stage 3b: Secondary | ICD-10-CM | POA: Diagnosis not present

## 2020-11-13 DIAGNOSIS — E11621 Type 2 diabetes mellitus with foot ulcer: Secondary | ICD-10-CM | POA: Diagnosis not present

## 2020-11-13 DIAGNOSIS — Z6837 Body mass index (BMI) 37.0-37.9, adult: Secondary | ICD-10-CM | POA: Diagnosis not present

## 2020-11-13 DIAGNOSIS — E11610 Type 2 diabetes mellitus with diabetic neuropathic arthropathy: Secondary | ICD-10-CM | POA: Diagnosis not present

## 2020-11-13 DIAGNOSIS — D500 Iron deficiency anemia secondary to blood loss (chronic): Secondary | ICD-10-CM | POA: Diagnosis not present

## 2020-11-13 DIAGNOSIS — Z8631 Personal history of diabetic foot ulcer: Secondary | ICD-10-CM | POA: Diagnosis not present

## 2020-11-13 DIAGNOSIS — I10 Essential (primary) hypertension: Secondary | ICD-10-CM | POA: Diagnosis not present

## 2020-11-15 ENCOUNTER — Encounter: Payer: Self-pay | Admitting: Podiatry

## 2020-11-15 ENCOUNTER — Other Ambulatory Visit: Payer: Self-pay | Admitting: Podiatry

## 2020-11-15 DIAGNOSIS — Z79890 Hormone replacement therapy (postmenopausal): Secondary | ICD-10-CM | POA: Diagnosis not present

## 2020-11-15 DIAGNOSIS — N183 Chronic kidney disease, stage 3 unspecified: Secondary | ICD-10-CM | POA: Diagnosis not present

## 2020-11-15 DIAGNOSIS — M869 Osteomyelitis, unspecified: Secondary | ICD-10-CM | POA: Diagnosis not present

## 2020-11-15 DIAGNOSIS — M86171 Other acute osteomyelitis, right ankle and foot: Secondary | ICD-10-CM | POA: Diagnosis not present

## 2020-11-15 DIAGNOSIS — I509 Heart failure, unspecified: Secondary | ICD-10-CM | POA: Diagnosis not present

## 2020-11-15 DIAGNOSIS — I69320 Aphasia following cerebral infarction: Secondary | ICD-10-CM | POA: Diagnosis not present

## 2020-11-15 DIAGNOSIS — K219 Gastro-esophageal reflux disease without esophagitis: Secondary | ICD-10-CM | POA: Diagnosis not present

## 2020-11-15 DIAGNOSIS — Z7901 Long term (current) use of anticoagulants: Secondary | ICD-10-CM | POA: Diagnosis not present

## 2020-11-15 DIAGNOSIS — E039 Hypothyroidism, unspecified: Secondary | ICD-10-CM | POA: Diagnosis not present

## 2020-11-15 DIAGNOSIS — G4733 Obstructive sleep apnea (adult) (pediatric): Secondary | ICD-10-CM | POA: Diagnosis not present

## 2020-11-15 DIAGNOSIS — I252 Old myocardial infarction: Secondary | ICD-10-CM | POA: Diagnosis not present

## 2020-11-15 DIAGNOSIS — E785 Hyperlipidemia, unspecified: Secondary | ICD-10-CM | POA: Diagnosis not present

## 2020-11-15 DIAGNOSIS — L02611 Cutaneous abscess of right foot: Secondary | ICD-10-CM

## 2020-11-15 DIAGNOSIS — E119 Type 2 diabetes mellitus without complications: Secondary | ICD-10-CM | POA: Diagnosis not present

## 2020-11-15 DIAGNOSIS — E1122 Type 2 diabetes mellitus with diabetic chronic kidney disease: Secondary | ICD-10-CM | POA: Diagnosis not present

## 2020-11-15 DIAGNOSIS — J449 Chronic obstructive pulmonary disease, unspecified: Secondary | ICD-10-CM | POA: Diagnosis not present

## 2020-11-15 DIAGNOSIS — Z7984 Long term (current) use of oral hypoglycemic drugs: Secondary | ICD-10-CM | POA: Diagnosis not present

## 2020-11-15 DIAGNOSIS — E1140 Type 2 diabetes mellitus with diabetic neuropathy, unspecified: Secondary | ICD-10-CM | POA: Diagnosis not present

## 2020-11-15 DIAGNOSIS — I130 Hypertensive heart and chronic kidney disease with heart failure and stage 1 through stage 4 chronic kidney disease, or unspecified chronic kidney disease: Secondary | ICD-10-CM | POA: Diagnosis not present

## 2020-11-15 DIAGNOSIS — Z9981 Dependence on supplemental oxygen: Secondary | ICD-10-CM | POA: Diagnosis not present

## 2020-11-15 DIAGNOSIS — D631 Anemia in chronic kidney disease: Secondary | ICD-10-CM | POA: Diagnosis not present

## 2020-11-15 MED ORDER — OXYCODONE-ACETAMINOPHEN 5-325 MG PO TABS: 1.0000 | ORAL_TABLET | ORAL | 0 refills | Status: DC | PRN

## 2020-11-15 MED ORDER — CLINDAMYCIN HCL 150 MG PO CAPS: 150.0000 mg | ORAL_CAPSULE | Freq: Two times a day (BID) | ORAL | 0 refills | Status: DC

## 2020-11-15 NOTE — Patient Instructions (Addendum)
  After Surgery Instructions   1) If you are recuperating from surgery anywhere other than home, please be sure to leave [place] the number where you can be reached.  2) Go directly home and rest.  3) Keep the operated foot(feet) elevated six inches above the hip when sitting or lying down. This will help control swelling and pain.  4) Support the elevated foot and leg with pillows. DO NOT PLACE PILLOWS UNDER THE KNEE.  5) DO NOT REMOVE or get your bandages WET, unless you were given different instructions by your doctor to do so. This increases the risk of infection.  6) Wear your surgical shoe or surgical boot at all times when you are up on your feet.  7) A limited amount of pain and swelling may occur. The skin may take on a bruised appearance. DO NOT BE ALARMED, THIS IS NORMAL.  8) For slight pain and swelling, apply an ice pack directly over the bandages for 15 minutes only out of each hour of the day. Continue until seen in the office for your first post op visit. DO NOT APPLY ANY FORM OF HEAT TO THE AREA.  9) Have prescriptions filled immediately and take as directed.  10) Drink lots of liquids, water and juice to stay hydrated.  11) CALL IMMEDIATELY IF:  *Bleeding continues until the following day of surgery  *Pain increases and/or does not respond to medication  *Bandages or cast appears to tight  *If your bandage gets wet  *Trip, fall or stump your surgical foot  *If your temperature goes above 101  *If you have ANY questions at all  12) You are expected to be weightbearing after your surgery.  If you need to reach the nurse for any reason, please call: [place]/: [phone] [place]: [phone] [place]: [phone]  Your next appointment is 11/22/2020 1:15 PM for a post-op check.

## 2020-11-17 NOTE — Progress Notes (Signed)
Cardiology Office Note:    Date:  11/19/2020   ID:  Debra Mcconnell, DOB [birth year], MRN [id number]  PCP:  Greig Right, MD  Cardiologist:  Shirlee More, MD    Referring MD: Greig Right, MD    ASSESSMENT:    1. Chronic systolic heart failure ([place])   2. Hypertensive heart disease with heart failure ([place])   3. Atypical atrial flutter ([place])   4. Hyperlipidemia, unspecified hyperlipidemia type    PLAN:    In order of problems listed above:  1. Compensated she has no fluid overload she will continue current treatment including loop diuretic beta-blocker transition ARB to Entresto and consider spironolactone next visit.  Check BMP and proBNP level today.  With left bundle branch block severely reduced ejection fraction check 1 day ZIO monitor and consider the merits of CRT and/or ICD 2. Rate is controlled continue beta-blocker and anticoagulant   Next appointment: 6 weeks   Medication Adjustments/Labs and Tests Ordered: Current medicines are reviewed at length with the patient today.  Concerns regarding medicines are outlined above.  Orders Placed This Encounter  Procedures  . LONG TERM MONITOR (3-14 DAYS)  . EKG 12-Lead   Meds ordered this encounter  Medications  . sacubitril-valsartan (ENTRESTO) 49-51 MG    Sig: Take 1 tablet by mouth 2 (two) times daily.    Dispense:  180 tablet    Refill:  3    No chief complaint on file.   History of Present Illness:    Debra Mcconnell is a 75 y.o. female with a hx of atrial flutter is a chronic rhythm with rate control hypertension left bundle branch block severe left ventricular dysfunction and heart failure chronic anticoagulation and stroke last seen 10/12/2018.  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  Her ejection fraction at that  time was 25 to 30% with global hypokinesia initial echocardiogram showed an ejection fraction worsened in the range of 20%.  There is a notation she was not placed on an ARB because of hypotension in hospital.  Compliance with diet, lifestyle and medications: Yes  Subsequently she had a echo performed at [facility] 08/17/2020 interpreted by my partner Dr. Harriet Masson showing the left ventricle to be dilated EF 30 to 35% elevated left atrial and left ventricular diastolic pressure the right ventricle had moderate systolic dysfunction both atrium were enlarged as well as moderate mitral mild tricuspid regurgitation  Despite her severely reduced ejection fraction she is not having edema shortness of breath chest pain palpitation or syncope has been on a good medical regimen including her beta-blocker anticoagulant loop diuretic and ARB and I am going to transition to [place].  I am concerned about her chronic rhythm of atrial flutter we will apply a 7-day ZIO monitor I will see back in 6 weeks we will need to reconsider the issue of biventricular pacemaker with left bundle branch block and ICD.  Her QRS duration is 168 ms. Past Medical History:  Diagnosis Date  . Acquired hammer toes of both feet 01/22/2017  . Arthritis 11/29/2013  . Atrial flutter ([place]) 01/24/2014   Overview:  CHADS2 vasc score= 5  . B-complex deficiency 11/29/2013  . Cerebral artery occlusion with cerebral infarction ([place]) 12/04/2013   Overview:  STORY: MRI +ve Left frontoparietal infarction (LMCA). +AFib.`E1o3L`IMPRESSION: BP is under controlled. Continue home health. +AFib, con't  Eliquis 5mg  bid. Will obtain old record from [facility] for review.  . Chronic systolic (congestive) heart failure ([place])   . Diabetic polyneuropathy associated with type 2 diabetes mellitus ([place]) 01/22/2017  . Essential hypertension 11/13/2015  . Hyperlipemia, retention 11/13/2015  . OSA (obstructive sleep apnea) 01/24/2014   Overview:  IMPRESSION: Will  obtain old record for review and ask DME to d/l compliance and fax the result to me. F/u in 6 wks.  . Pre-ulcerative corn or callous 01/22/2017  . Stroke ([place]) 01/24/2014  . Thyroid disease 11/29/2013  . 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 APPENDECTOMY    . CHOLECYSTECTOMY  2009  . [place]   arm  . 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 KNEE SURGERY  2001   Knee replacement    Current Medications: Current Meds  Medication Sig  . alendronate (FOSAMAX) 70 MG tablet Take 70 mg by mouth once a week.  Marland Kitchen atorvastatin (LIPITOR) 40 MG tablet Take 40 mg by mouth daily.  Marland Kitchen buPROPion (WELLBUTRIN XL) 300 MG 24 hr tablet Take 300 mg by mouth daily.  . carvedilol (COREG) 25 MG tablet Take 25 mg by mouth 2 (two) times daily with a meal.  . Cholecalciferol (VITAMIN D) 2000 units CAPS Take 4,000 Units by mouth 2 (two) times daily.  . clindamycin (CLEOCIN) 150 MG capsule Take 1 capsule (150 mg total) by mouth 2 (two) times daily.  . Cyanocobalamin (VITAMIN B 12 PO) Take 1,000 mcg by mouth daily.  Marland Kitchen doxycycline (VIBRA-TABS) 100 MG tablet Take 1 tablet (100 mg total) by mouth 2 (two) times daily.  Marland Kitchen ELIQUIS 5 MG TABS tablet TAKE 1 TABLET TWICE A DAY  (MUST SCHEDULE APPOINTMENT FOR FOLLOW UP BEFORE ANY   ADDITIONAL REFILLS)  . Exenatide ER 2 MG SRER Inject 2 mg into the skin once a week.   . ferrous sulfate 325 (65 FE) MG EC tablet Take 325 mg by mouth 2 (two) times daily with a meal.  . fluconazole (DIFLUCAN) 150 MG tablet Take 150 mg by mouth once.  . furosemide (LASIX) 20 MG tablet Take 20 mg by mouth. Take 2 tablets (40 mg) in am and 1 tablet (20 mg) at noon  . gabapentin (NEURONTIN) 300 MG capsule 300 mg. Take 1 capsule am and noon, and 3 capsule (900 mg) at bedtime  . glimepiride (AMARYL) 1 MG tablet Take 1 mg by mouth in the morning and at bedtime.  . hydrALAZINE (APRESOLINE) 25 MG  tablet Take 25 mg by mouth 3 (three) times daily.  . hydrALAZINE (APRESOLINE) 50 MG tablet Take 50 mg by mouth 3 (three) times daily.  Marland Kitchen ketoconazole (NIZORAL) 2 % cream Apply topically at bedtime.  . Lancets (ONETOUCH DELICA PLUS [id number]) MISC Apply 1 each topically daily.  Marland Kitchen levothyroxine (SYNTHROID, LEVOTHROID) 175 MCG tablet Take 175 mcg by mouth daily.  Marland Kitchen nystatin ointment (MYCOSTATIN) APPLY TOPICALLY TO THE AFFECTED AREA TWICE DAILY  . omeprazole (PRILOSEC) 20 MG capsule Take 20 mg by mouth daily.   Glory Rosebush VERIO test strip daily.  Marland Kitchen oxyCODONE-acetaminophen (PERCOCET) 5-325 MG tablet Take 1 tablet by mouth every 4 (four) hours as needed for severe pain.  . polycarbophil (FIBERCON) 625 MG tablet Take 625 mg by mouth daily.  . potassium chloride (KLOR-CON) 10 MEQ tablet Take 15 mEq by mouth daily.  . sacubitril-valsartan (ENTRESTO) 49-51 MG Take 1  tablet by mouth 2 (two) times daily.  Marland Kitchen zolpidem (AMBIEN) 5 MG tablet Take 5 mg by mouth at bedtime as needed.  . [DISCONTINUED] olmesartan (BENICAR) 20 MG tablet Take 20 mg by mouth daily.     Allergies:   Gabapentin and Lisinopril   Social History   Socioeconomic History  . Marital status: Divorced    Spouse name: Not on file  . Number of children: Not on file  . Years of education: Not on file  . Highest education level: Not on file  Occupational History  . Not on file  Tobacco Use  . Smoking status: Never Smoker  . Smokeless tobacco: Never Used  Vaping Use  . Vaping Use: Never used  Substance and Sexual Activity  . Alcohol use: No  . Drug use: No  . Sexual activity: Not on file  Other Topics Concern  . Not on file  Social History Narrative  . Not on file   Social Determinants of Health   Financial Resource Strain: Not on file  Food Insecurity: Not on file  Transportation Needs: Not on file  Physical Activity: Not on file  Stress: Not on file  Social Connections: Not on file     Family History: The patient's  family history includes Cancer in her father; Heart disease in her father and mother. ROS:   Please see the history of present illness.    All other systems reviewed and are negative.  EKGs/Labs/Other Studies Reviewed:    The following studies were reviewed today:  EKG:  EKG ordered today and personally reviewed.  The ekg ordered today demonstrates slow atrial flutter ventricular rate 63 bpm left bundle branch block QRS duration 168 ms  Most recent labs 08/13/2020 from PCP cholesterol 92 LDL 36 triglycerides 89 HDL 38 A1c 7.9% creatinine 1.16  Physical Exam:    VS:  BP (!) 106/54   Pulse 63   Ht 5\' 8"  (1.727 m)   Wt 205 lb 6.4 oz (93.2 kg)   SpO2 96%   BMI 31.23 kg/m     Wt Readings from Last 3 Encounters:  11/19/20 205 lb 6.4 oz (93.2 kg)  10/20/19 222 lb 3.6 oz (100.8 kg)  10/12/18 254 lb 8 oz (115.4 kg)     GEN: Looks chronically ill well nourished, well developed in no acute distress HEENT: Normal NECK: No JVD; No carotid bruits LYMPHATICS: No lymphadenopathy CARDIAC: S1 is variable irregular rhythm S2 paradoxical no murmurs, rubs, gallops RESPIRATORY:  Clear to auscultation without rales, wheezing or rhonchi  ABDOMEN: Soft, non-tender, non-distended MUSCULOSKELETAL:  No edema; No deformity  SKIN: Warm and dry NEUROLOGIC:  Alert and oriented x 3 PSYCHIATRIC:  Normal affect    Signed, Shirlee More, MD  11/19/2020 2:41 PM    [facility]

## 2020-11-19 ENCOUNTER — Ambulatory Visit (INDEPENDENT_AMBULATORY_CARE_PROVIDER_SITE_OTHER): Payer: Medicare Other | Admitting: Cardiology

## 2020-11-19 ENCOUNTER — Other Ambulatory Visit: Payer: Self-pay

## 2020-11-19 ENCOUNTER — Ambulatory Visit (INDEPENDENT_AMBULATORY_CARE_PROVIDER_SITE_OTHER): Payer: Medicare Other

## 2020-11-19 ENCOUNTER — Encounter: Payer: Self-pay | Admitting: Cardiology

## 2020-11-19 VITALS — BP 106/54 | HR 63 | Ht 68.0 in | Wt 205.4 lb

## 2020-11-19 DIAGNOSIS — E785 Hyperlipidemia, unspecified: Secondary | ICD-10-CM

## 2020-11-19 DIAGNOSIS — I5022 Chronic systolic (congestive) heart failure: Secondary | ICD-10-CM

## 2020-11-19 DIAGNOSIS — I110 Hypertensive heart disease with heart failure: Secondary | ICD-10-CM | POA: Diagnosis not present

## 2020-11-19 DIAGNOSIS — I484 Atypical atrial flutter: Secondary | ICD-10-CM

## 2020-11-19 MED ORDER — SACUBITRIL-VALSARTAN 49-51 MG PO TABS: 1.0000 | ORAL_TABLET | Freq: Two times a day (BID) | ORAL | 3 refills | Status: DC

## 2020-11-19 NOTE — Patient Instructions (Signed)
Medication Instructions:  Your physician has recommended you make the following change in your medication:  STOP: Olmesartan START: Entresto 49/51 mg take one tablet by mouth twice daily.  *If you need a refill on your cardiac medications before your next appointment, please call your pharmacy*   Lab Work: Your physician recommends that you return for lab work in: TODAY BMP, ProBNP If you have labs (blood work) drawn today and your tests are completely normal, you will receive your results only by: Marland Kitchen MyChart Message (if you have MyChart) OR . A paper copy in the mail If you have any lab test that is abnormal or we need to change your treatment, we will call you to review the results.   Testing/Procedures: A zio monitor was ordered today. It will remain on for 7 days. You will then return monitor and event diary in provided box. It takes 1-2 weeks for report to be downloaded and returned to [place]. We will call you with the results. If monitor falls off or has orange flashing light, please call Zio for further instructions.      Follow-Up: At [facility], you and your health needs are our priority.  As part of our continuing mission to provide you with exceptional heart care, we have created designated Provider Care Teams.  These Care Teams include your primary Cardiologist (physician) and Advanced Practice Providers (APPs -  Physician Assistants and Nurse Practitioners) who all work together to provide you with the care you need, when you need it.  We recommend signing up for the patient portal called "MyChart".  Sign up information is provided on this After Visit Summary.  MyChart is used to connect with patients for Virtual Visits (Telemedicine).  Patients are able to view lab/test results, encounter notes, upcoming appointments, etc.  Non-urgent messages can be sent to your provider as well.   To learn more about what you can do with MyChart, go to [email or web address].    Your next  appointment:   6 week(s)  The format for your next appointment:   In Person  Provider:   Shirlee More, MD   Other Instructions

## 2020-11-20 ENCOUNTER — Telehealth: Payer: Self-pay

## 2020-11-20 LAB — BASIC METABOLIC PANEL
BUN/Creatinine Ratio: 32 — ABNORMAL HIGH (ref 12–28)
BUN: 46 mg/dL — ABNORMAL HIGH (ref 8–27)
CO2: 19 mmol/L — ABNORMAL LOW (ref 20–29)
Calcium: 8.8 mg/dL (ref 8.7–10.3)
Chloride: 110 mmol/L — ABNORMAL HIGH (ref 96–106)
Creatinine, Ser: 1.42 mg/dL — ABNORMAL HIGH (ref 0.57–1.00)
Glucose: 72 mg/dL (ref 65–99)
Potassium: 4.2 mmol/L (ref 3.5–5.2)
Sodium: 146 mmol/L — ABNORMAL HIGH (ref 134–144)
eGFR: 39 mL/min/{1.73_m2} — ABNORMAL LOW (ref >59)

## 2020-11-20 LAB — PRO B NATRIURETIC PEPTIDE: NT-Pro BNP: 7426 pg/mL — ABNORMAL HIGH (ref 0–301)

## 2020-11-20 NOTE — Telephone Encounter (Signed)
-----   Message from Richardo Priest, MD sent at 11/20/2020  7:39 AM EDT ----- Stable started on Entresto yesterday no changes

## 2020-11-20 NOTE — Telephone Encounter (Signed)
Left message on patients voicemail to please return our call.   

## 2020-11-20 NOTE — Telephone Encounter (Signed)
Spoke with patient regarding results and recommendation.  Patient verbalizes understanding and is agreeable to plan of care. Advised patient to call back with any issues or concerns.  

## 2020-11-20 NOTE — Telephone Encounter (Signed)
Prior Authorization for patients Debra Mcconnell has been approved at this time.

## 2020-11-22 ENCOUNTER — Other Ambulatory Visit: Payer: Self-pay

## 2020-11-22 ENCOUNTER — Ambulatory Visit (INDEPENDENT_AMBULATORY_CARE_PROVIDER_SITE_OTHER): Payer: Medicare Other | Admitting: Podiatry

## 2020-11-22 DIAGNOSIS — L02611 Cutaneous abscess of right foot: Secondary | ICD-10-CM | POA: Diagnosis not present

## 2020-11-22 DIAGNOSIS — M86171 Other acute osteomyelitis, right ankle and foot: Secondary | ICD-10-CM

## 2020-11-22 DIAGNOSIS — L03031 Cellulitis of right toe: Secondary | ICD-10-CM

## 2020-11-22 NOTE — Progress Notes (Signed)
  Subjective:  Patient ID: Debra Mcconnell, female    DOB: 1946/03/08,  MRN: [id number]  Chief Complaint  Patient presents with  . Routine Post Op    POV #1 DOS 11/15/20 RT 3RD TOE AMPUTATION   DOS: 11/15/20 Procedure: Amputation 3rd toe right  75 y.o. female presents with the above complaint. History confirmed with patient.   Objective:  Physical Exam: tenderness at the surgical site, local edema noted and calf supple, nontender. Incision: healing well, no dehiscence, no significant erythema, slight clear drainage present  Assessment:   1. Cellulitis and abscess of toe of right foot   2. Acute osteomyelitis of right ankle or foot ([place])     Plan:  Patient was evaluated and treated and all questions answered.  Post-operative State -Wound healing well. -Dressing applied consisting of betadine, sterile gauze, kerlix and ACE bandage -WBAT in Surgical shoe   Return in about 1 week (around 11/29/2020).

## 2020-11-26 DIAGNOSIS — I484 Atypical atrial flutter: Secondary | ICD-10-CM | POA: Diagnosis not present

## 2020-11-29 ENCOUNTER — Encounter: Payer: Self-pay | Admitting: Podiatry

## 2020-11-29 ENCOUNTER — Other Ambulatory Visit: Payer: Self-pay

## 2020-11-29 ENCOUNTER — Ambulatory Visit (INDEPENDENT_AMBULATORY_CARE_PROVIDER_SITE_OTHER): Payer: Medicare Other | Admitting: Podiatry

## 2020-11-29 DIAGNOSIS — L03031 Cellulitis of right toe: Secondary | ICD-10-CM | POA: Diagnosis not present

## 2020-11-29 DIAGNOSIS — M86171 Other acute osteomyelitis, right ankle and foot: Secondary | ICD-10-CM

## 2020-11-29 DIAGNOSIS — L02611 Cutaneous abscess of right foot: Secondary | ICD-10-CM | POA: Diagnosis not present

## 2020-11-29 NOTE — Progress Notes (Signed)
  Subjective:  Patient ID: Debra Mcconnell, female    DOB: 12/10/1945,  MRN: [id number]  Chief Complaint  Patient presents with  . Routine Post Op    I am doing ok and I did get the bandage wet today on the right foot    DOS: 11/15/20 Procedure: Amputation 3rd toe right  75 y.o. female presents with the above complaint. History confirmed with patient.   Objective:  Physical Exam: tenderness at the surgical site, local edema noted and calf supple, nontender. Incision: healing well, no dehiscence, no significant erythema, slight clear drainage present  Assessment:   1. Cellulitis and abscess of toe of right foot   2. Acute osteomyelitis of right ankle or foot ([place])     Plan:  Patient was evaluated and treated and all questions answered.  Post-operative State -Wound healing well. Macerated today -Povidone ointment and band-aid applied. -Ok to shower. Pt to apply medi-honey and band-aid after bathing.   Return in about 1 week (around 12/06/2020) for Post-Op (No XRs), suture removal.

## 2020-12-02 IMAGING — DX DG CHEST 1V PORT
1 series · 1 of 1 positions shown · non-contrast
Comparison: Chest radiograph 10/07/2019.

CLINICAL DATA: Respiratory failure.

EXAM:
PORTABLE CHEST 1 VIEW

[chest ap]
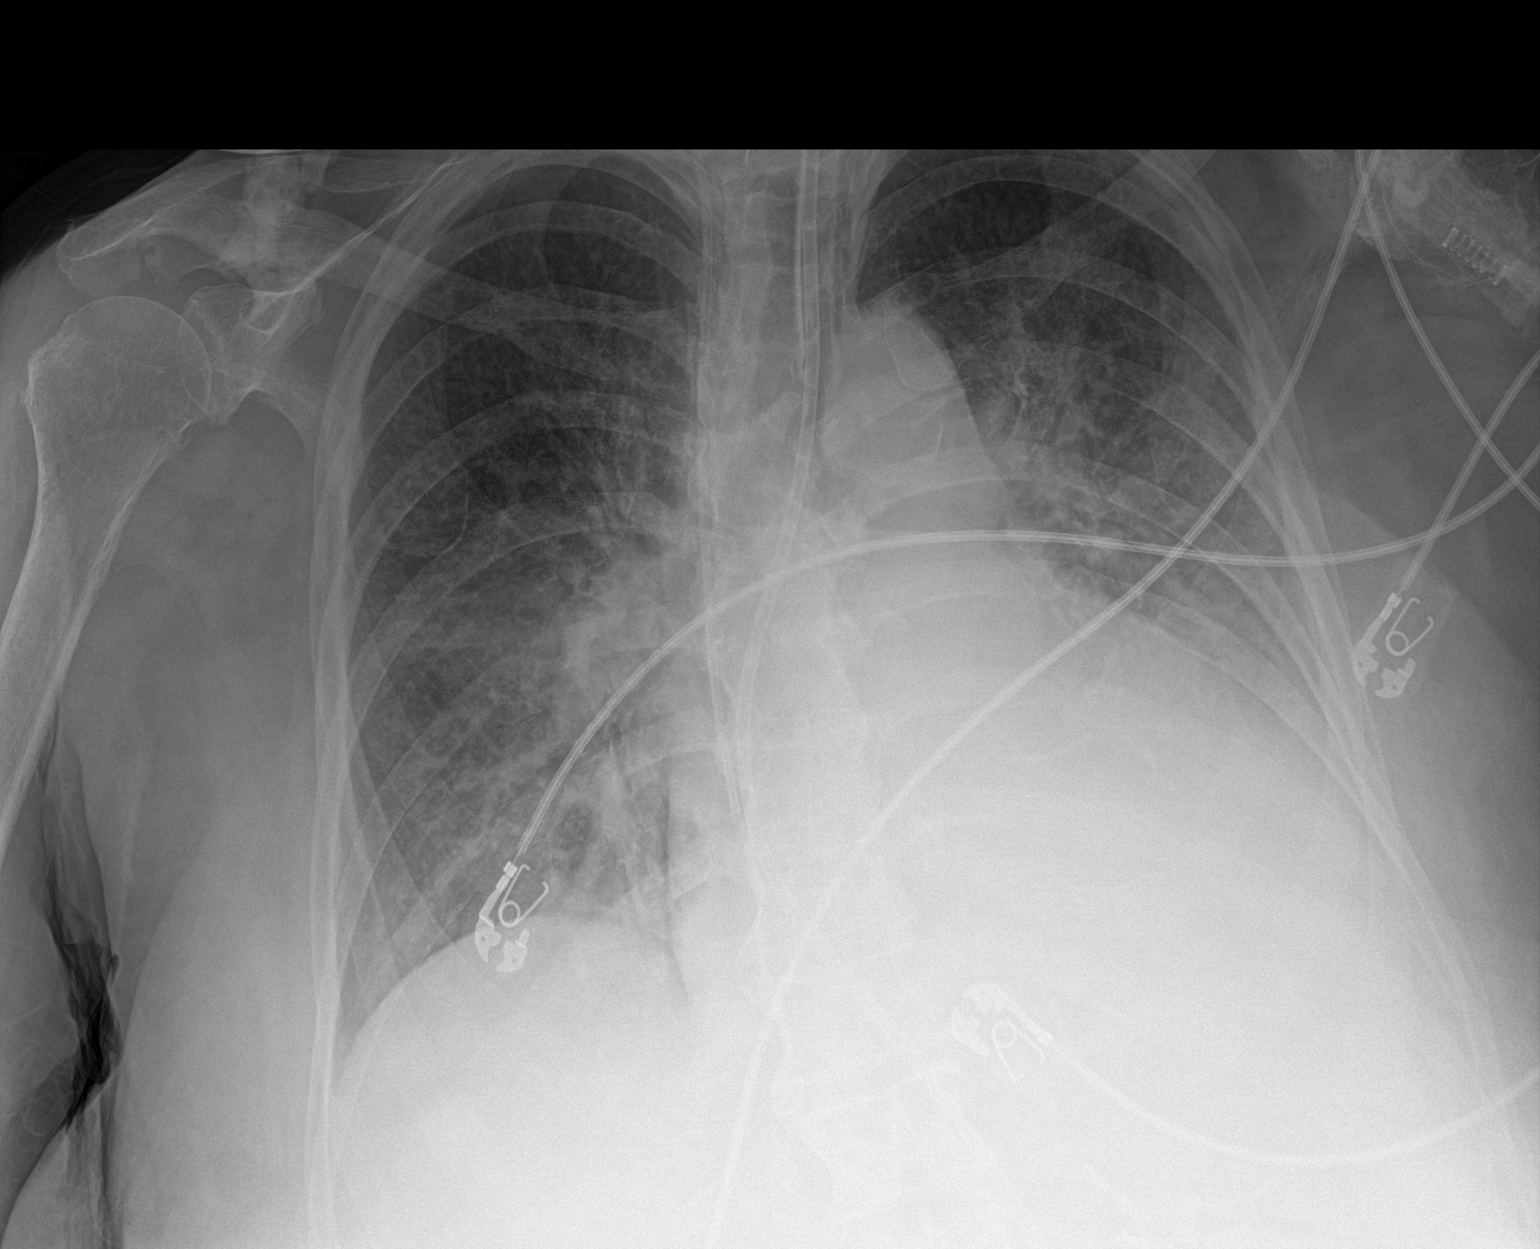

[1 of 1 positions shown; findings below may reference images not displayed]

FINDINGS: Enteric tube courses inferior to the diaphragm. ET tube terminates
in the mid trachea. Right IJ central venous catheter tip projects
over the right atrium. Monitoring leads overlie the patient. Stable
cardiomegaly. Similar-appearing bilateral airspace opacities, left
greater than right. Probable small left pleural effusion. No
pneumothorax. Thoracic spine degenerative changes.
IMPRESSION: Similar-appearing left greater than right airspace opacities.

Probable small left pleural effusion.

## 2020-12-03 DIAGNOSIS — I484 Atypical atrial flutter: Secondary | ICD-10-CM | POA: Diagnosis not present

## 2020-12-03 IMAGING — DX DG CHEST 1V PORT
1 series · 2 of 2 positions shown · non-contrast
Comparison: Chest radiograph 10/08/2019

CLINICAL DATA: Respiratory failure

EXAM:
PORTABLE CHEST 1 VIEW

[Series 1: chest ap · 0.14mm/px · 2 of 2 slices shown]
[im 1/2]
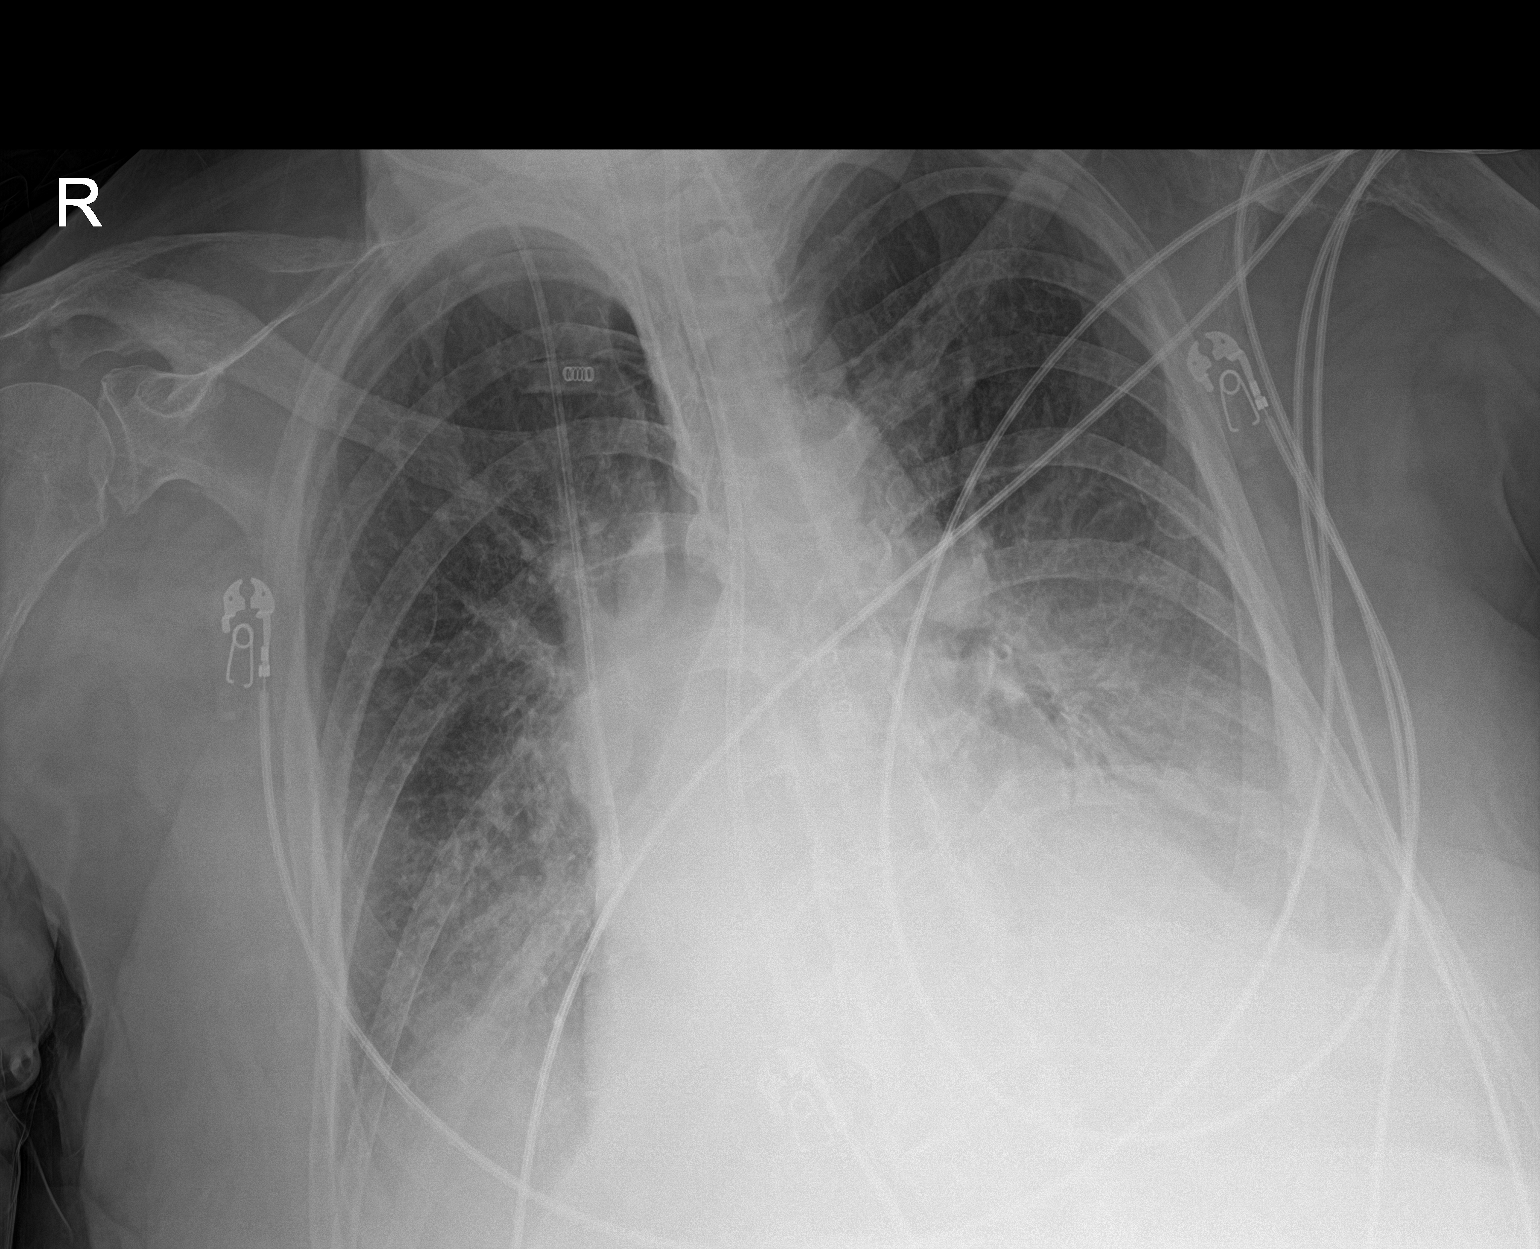
[im 2/2]
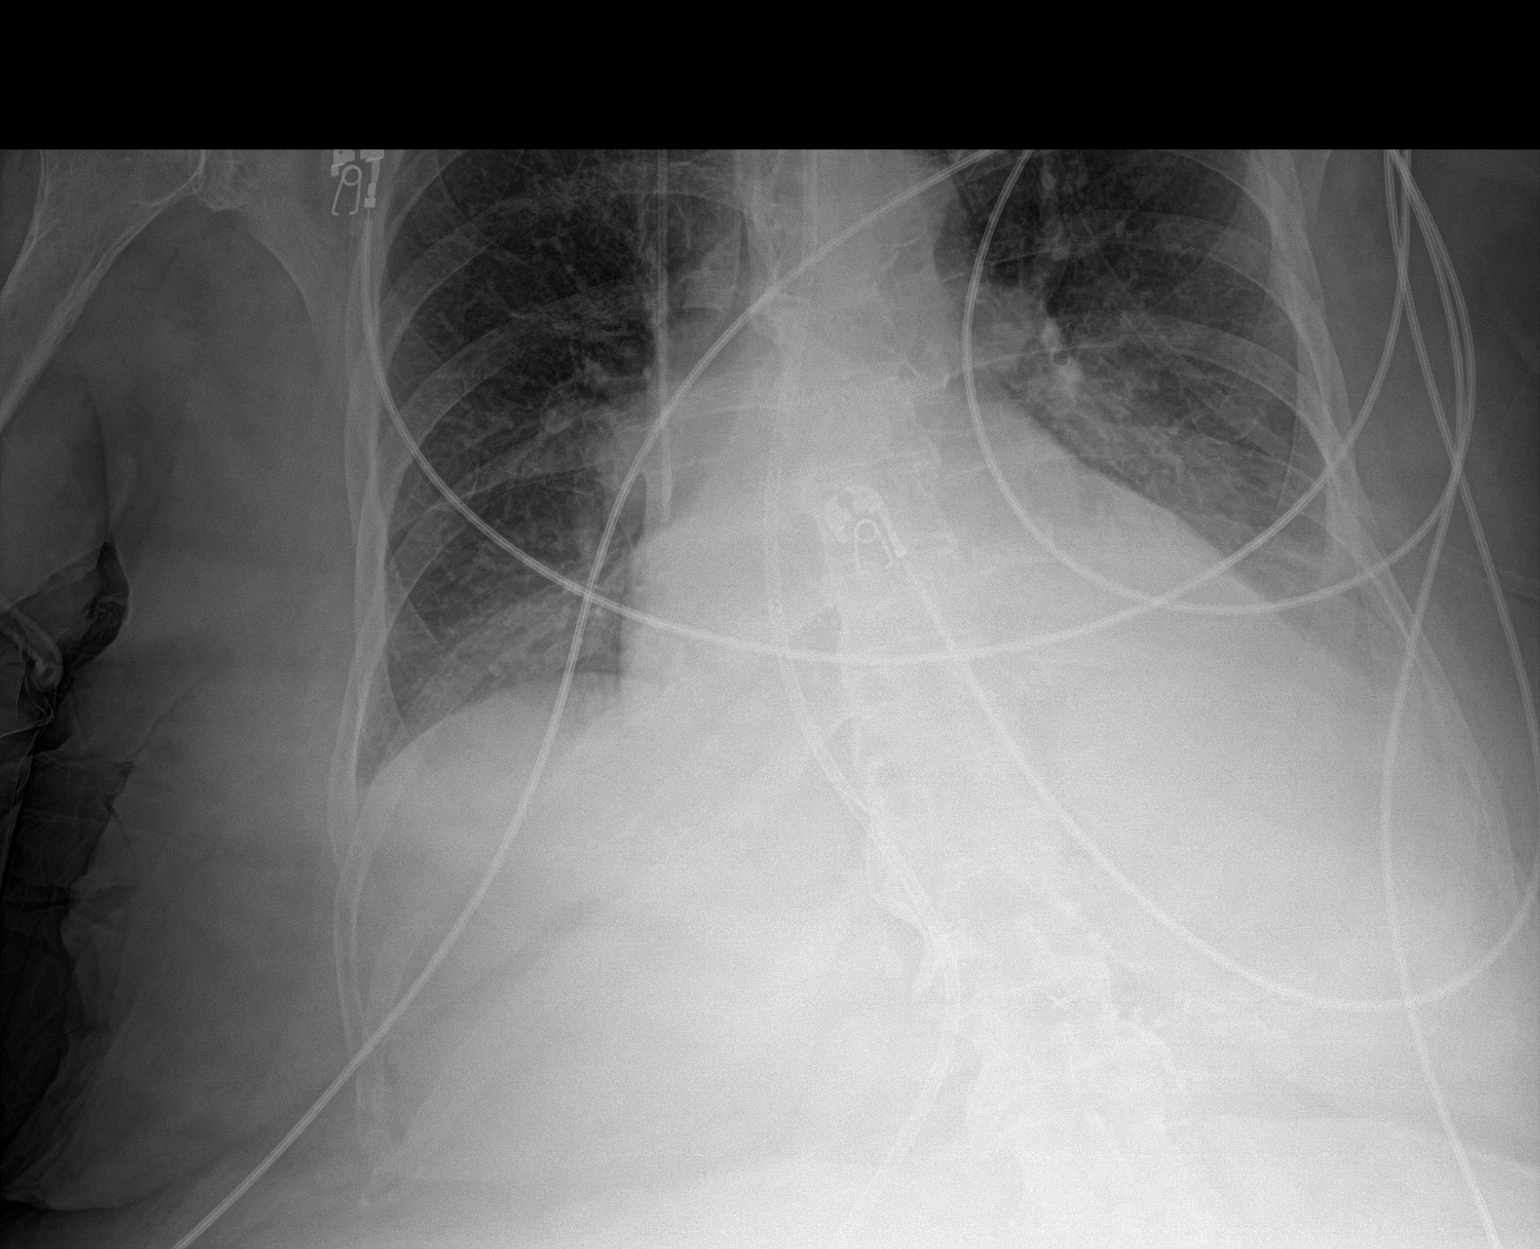

[2 of 2 positions shown; findings below may reference images not displayed]

FINDINGS: Right IJ central venous catheter tip projects over the superior vena
cava. Enteric tube courses inferior to the diaphragm. ET tube
terminates mid trachea. Monitoring leads overlie the patient. Stable
cardiomegaly. Slight interval improvement in bilateral airspace
opacities. Persistent small left pleural effusion and retrocardiac
consolidation.
IMPRESSION: Slight interval improvement diffuse bilateral airspace opacities.

## 2020-12-05 DIAGNOSIS — Z961 Presence of intraocular lens: Secondary | ICD-10-CM | POA: Diagnosis not present

## 2020-12-05 DIAGNOSIS — E113493 Type 2 diabetes mellitus with severe nonproliferative diabetic retinopathy without macular edema, bilateral: Secondary | ICD-10-CM | POA: Diagnosis not present

## 2020-12-05 DIAGNOSIS — H524 Presbyopia: Secondary | ICD-10-CM | POA: Diagnosis not present

## 2020-12-05 DIAGNOSIS — H47213 Primary optic atrophy, bilateral: Secondary | ICD-10-CM | POA: Diagnosis not present

## 2020-12-05 DIAGNOSIS — Z7984 Long term (current) use of oral hypoglycemic drugs: Secondary | ICD-10-CM | POA: Diagnosis not present

## 2020-12-06 ENCOUNTER — Ambulatory Visit (INDEPENDENT_AMBULATORY_CARE_PROVIDER_SITE_OTHER): Payer: Medicare Other | Admitting: Podiatry

## 2020-12-06 ENCOUNTER — Telehealth: Payer: Self-pay

## 2020-12-06 ENCOUNTER — Other Ambulatory Visit: Payer: Self-pay

## 2020-12-06 ENCOUNTER — Encounter: Payer: Self-pay | Admitting: Podiatry

## 2020-12-06 DIAGNOSIS — L03031 Cellulitis of right toe: Secondary | ICD-10-CM

## 2020-12-06 DIAGNOSIS — E1169 Type 2 diabetes mellitus with other specified complication: Secondary | ICD-10-CM | POA: Diagnosis not present

## 2020-12-06 DIAGNOSIS — M86171 Other acute osteomyelitis, right ankle and foot: Secondary | ICD-10-CM

## 2020-12-06 DIAGNOSIS — B351 Tinea unguium: Secondary | ICD-10-CM

## 2020-12-06 DIAGNOSIS — E1142 Type 2 diabetes mellitus with diabetic polyneuropathy: Secondary | ICD-10-CM

## 2020-12-06 DIAGNOSIS — L02611 Cutaneous abscess of right foot: Secondary | ICD-10-CM

## 2020-12-06 NOTE — Telephone Encounter (Signed)
-----   Message from Richardo Priest, MD sent at 12/06/2020 12:56 PM EDT ----- Stable resultheart rates are adequate.  I do not think at this time she requires a pacemaker

## 2020-12-06 NOTE — Progress Notes (Signed)
  Subjective:  Patient ID: Debra Mcconnell, female    DOB: 03-22-1946,  MRN: [id number]  Chief Complaint  Patient presents with  . Nail Problem    Trim nails   . Routine Post Op    Doing good on the right foot    DOS: 11/15/20 Procedure: Amputation 3rd toe right  75 y.o. female presents with the above complaint. History confirmed with patient.   Objective:  Physical Exam: tenderness at the surgical site, local edema noted and calf supple, nontender. Incision: healing well, no dehiscence, no significant erythema, slight clear drainage present  Assessment:   1. Cellulitis and abscess of toe of right foot   2. Acute osteomyelitis of right ankle or foot ([place])   3. 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  Post-operative State -Healing well -Sutures removed. Continue to apply abx ointment and band-aid daily x1 week. Then transition to normal shoe  Return in about 2 weeks (around 12/20/2020) for amputation f/u.

## 2020-12-06 NOTE — Telephone Encounter (Signed)
Spoke with patient regarding results and recommendation.  Patient verbalizes understanding and is agreeable to plan of care. Advised patient to call back with any issues or concerns.  

## 2020-12-11 IMAGING — CT CT HEAD W/O CM
2 series · 15 of 30 positions shown, 19 images · non-contrast
Comparison: CT head/cervical spine 10/04/2019, head CT 02/06/2016,
brain MRI 09/14/2013

CLINICAL DATA: Transient ischemic attack (TIA), dysarthria.

EXAM:
CT HEAD WITHOUT CONTRAST
TECHNIQUE: Contiguous axial images were obtained from the base of the skull
through the vertex without intravenous contrast.

[Series 2: routine head w/o · axial · non-contrast · 0.42mm/px · z∈[-45,+99]mm · 13 of 36 slices shown, 17 images]
[im 3/36  brain]
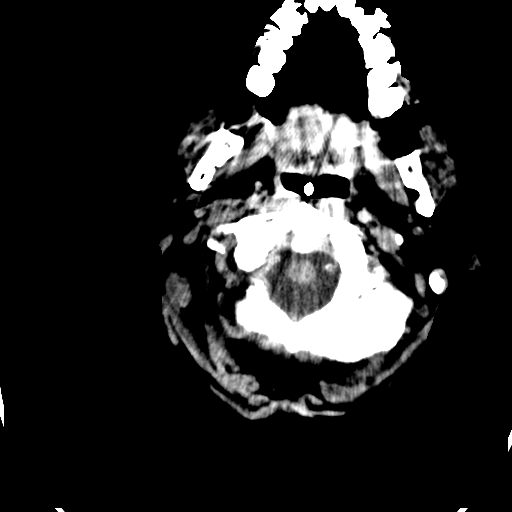
[im 3/36  bone]
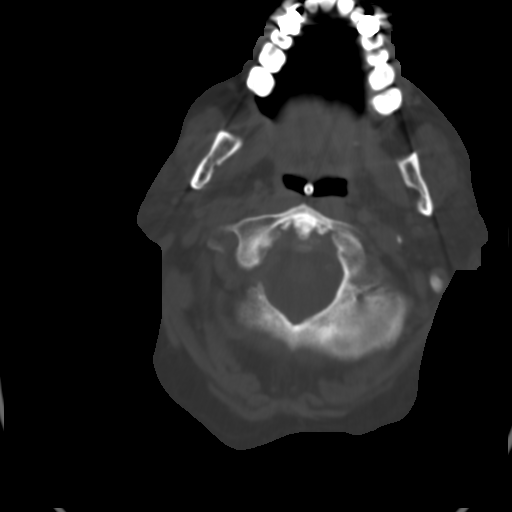
[im 6/36  brain]
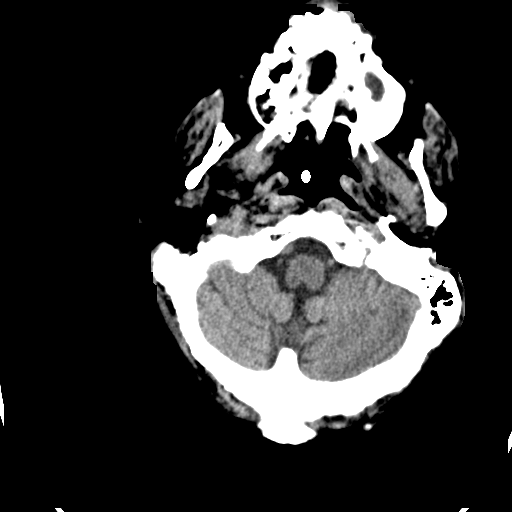
[im 8/36  brain]
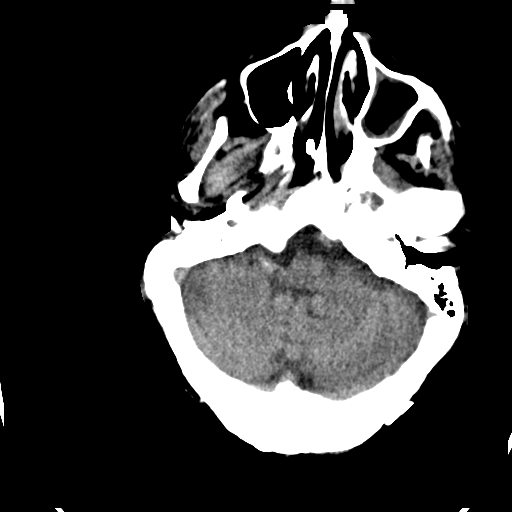
[im 11/36  brain]
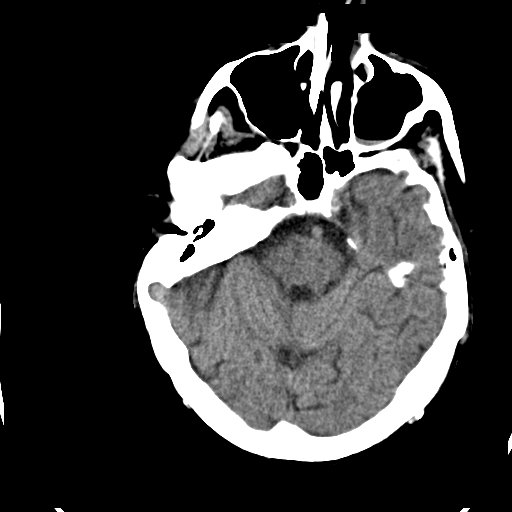
[im 13/36  brain]
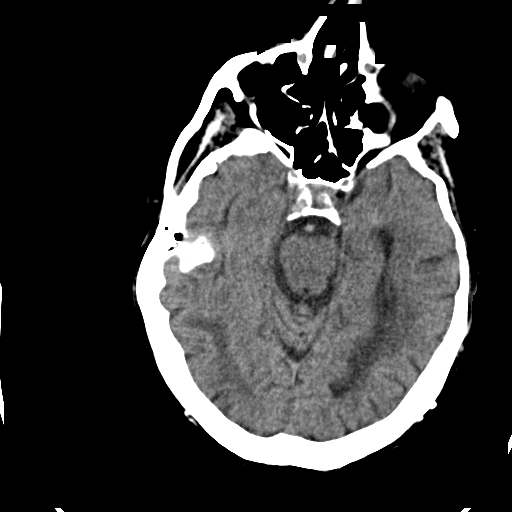
[im 13/36  bone]
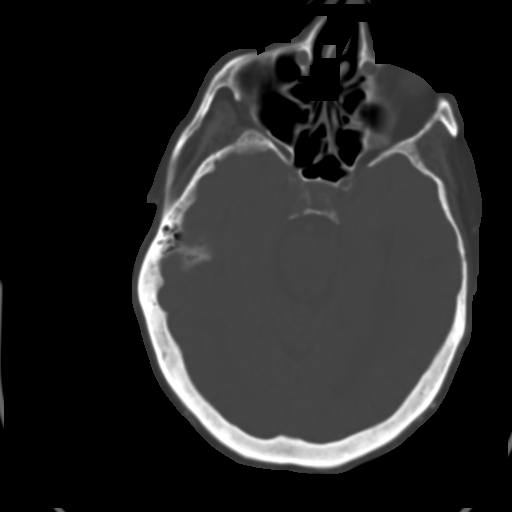
[im 16/36  brain]
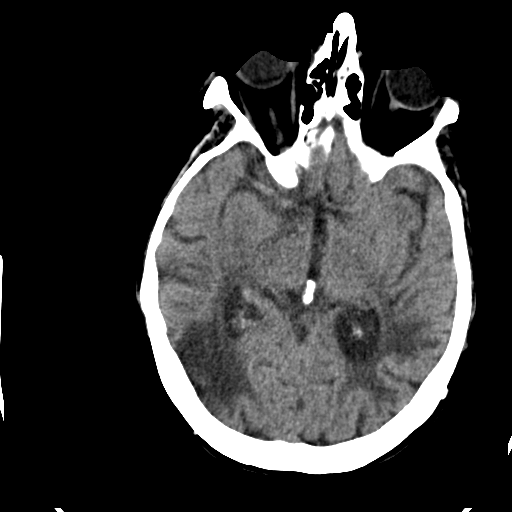
[im 18/36  brain]
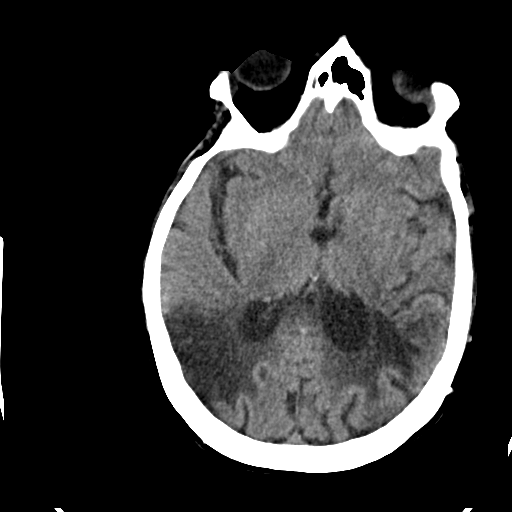
[im 21/36  brain]
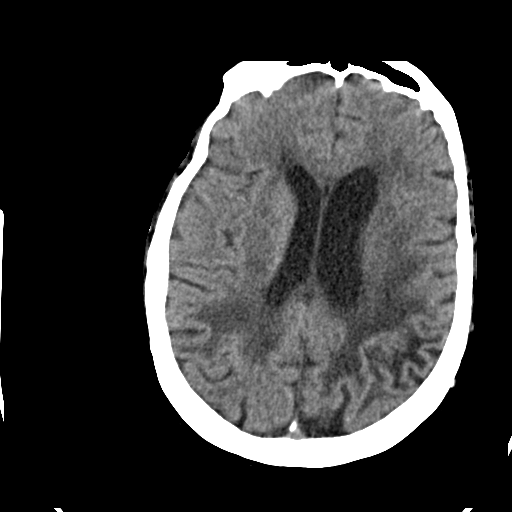
[im 23/36  brain]
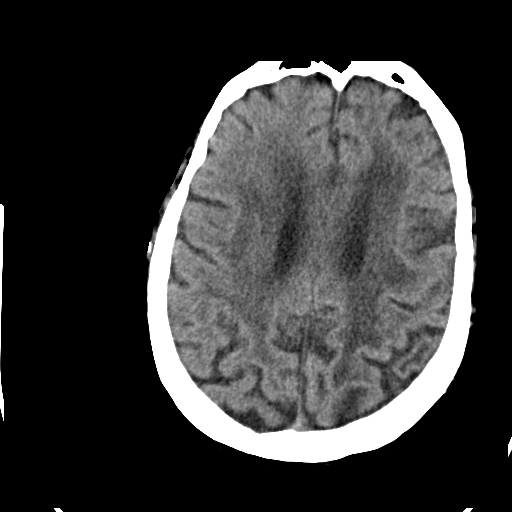
[im 23/36  bone]
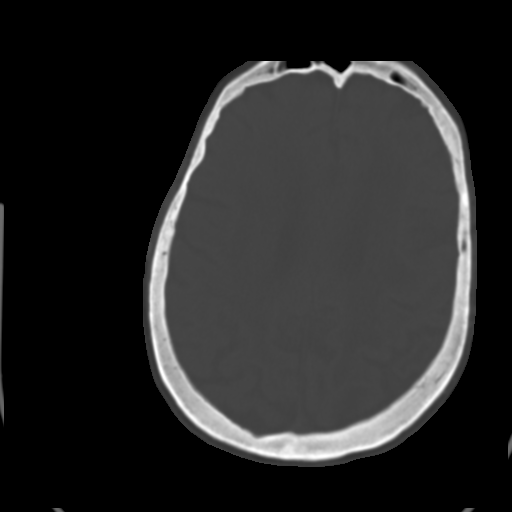
[im 26/36  brain]
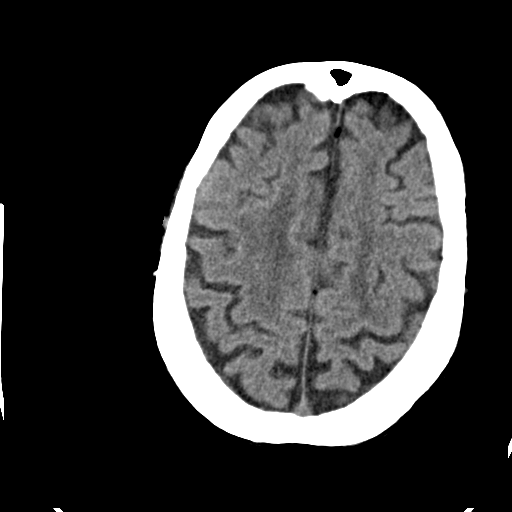
[im 28/36  brain]
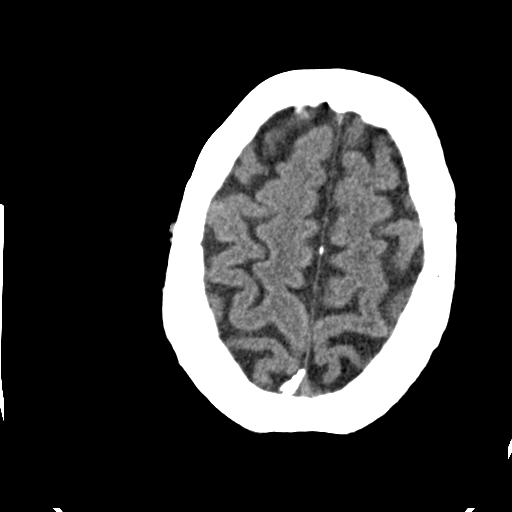
[im 31/36  brain]
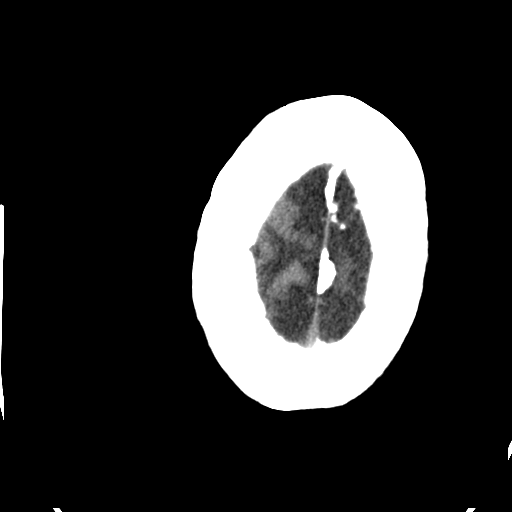
[im 33/36  brain]
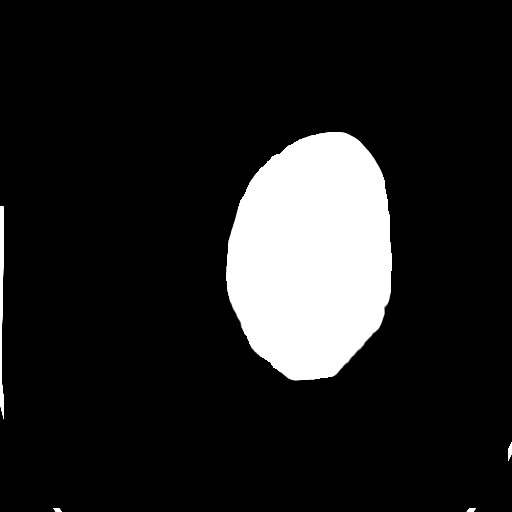
[im 33/36  bone]
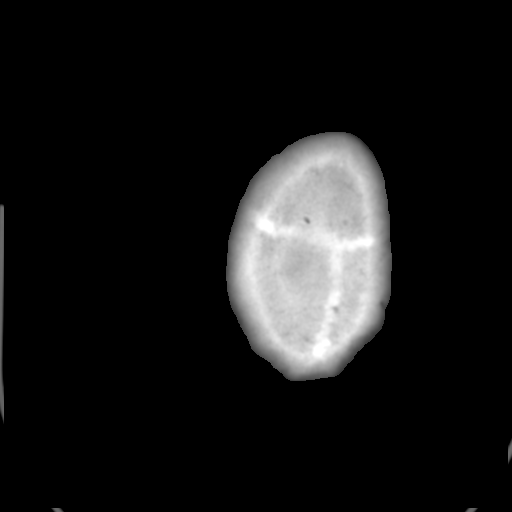

[Series 4: routine head bone · axial · 0.42mm/px · z∈[-45,-21]mm · 2 of 36 slices shown]
[im 3/36  bone]
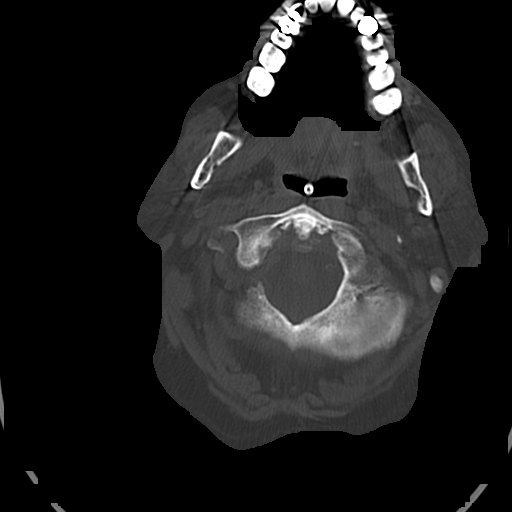
[im 8/36  bone]
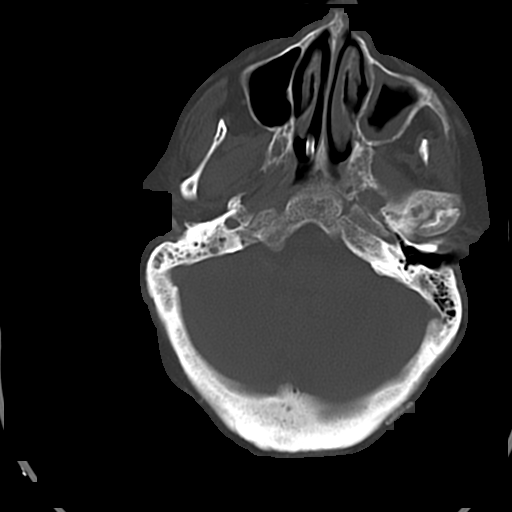

[15 of 30 positions shown; findings below may reference images not displayed]

FINDINGS: Brain:

Again demonstrated are chronic bilateral posterior MCA territory
cortically based infarcts. On the right, chronic infarction changes
are present within portions of the right parietal, temporal and
occipital lobes. On the left, chronic infarction changes are
predominantly present within the left temporoparietal lobes. Known
chronic lacunar infarcts within the thalami, pons and left basal
cerebellum were better appreciated on brain MRI 09/14/2013.

No acute demarcated cortical infarct is identified. No evidence of
acute intracranial hemorrhage. No evidence of intracranial mass. No
midline shift or extra-axial fluid collection. Stable background
generalized parenchymal atrophy and chronic small vessel ischemic
disease.

Vascular: No hyperdense vessel. Atherosclerotic calcifications.

Skull: Normal. Negative for fracture or focal lesion.

Sinuses/Orbits: Visualized orbits demonstrate no acute abnormality.
Mild mucosal thickening and small air-fluid level within the left
maxillary sinus. Partial opacification of right mastoid air cells.
IMPRESSION: No evidence of acute intracranial abnormality.

Generalized parenchymal atrophy and chronic small vessel ischemic
disease with multiple chronic infarcts as described.

Mild mucosal thickening and small air-fluid level within the left
maxillary sinus.

Small right mastoid effusion.

## 2020-12-12 DIAGNOSIS — N1832 Chronic kidney disease, stage 3b: Secondary | ICD-10-CM | POA: Diagnosis not present

## 2020-12-12 DIAGNOSIS — E11621 Type 2 diabetes mellitus with foot ulcer: Secondary | ICD-10-CM | POA: Diagnosis not present

## 2020-12-12 DIAGNOSIS — Z89429 Acquired absence of other toe(s), unspecified side: Secondary | ICD-10-CM | POA: Diagnosis not present

## 2020-12-12 DIAGNOSIS — E1129 Type 2 diabetes mellitus with other diabetic kidney complication: Secondary | ICD-10-CM | POA: Diagnosis not present

## 2020-12-12 DIAGNOSIS — I5042 Chronic combined systolic (congestive) and diastolic (congestive) heart failure: Secondary | ICD-10-CM | POA: Diagnosis not present

## 2020-12-12 DIAGNOSIS — E1140 Type 2 diabetes mellitus with diabetic neuropathy, unspecified: Secondary | ICD-10-CM | POA: Diagnosis not present

## 2020-12-13 ENCOUNTER — Ambulatory Visit: Payer: Medicare Other | Admitting: Podiatry

## 2020-12-20 ENCOUNTER — Ambulatory Visit (INDEPENDENT_AMBULATORY_CARE_PROVIDER_SITE_OTHER): Payer: Medicare Other | Admitting: Podiatry

## 2020-12-20 ENCOUNTER — Other Ambulatory Visit: Payer: Self-pay

## 2020-12-20 DIAGNOSIS — M86171 Other acute osteomyelitis, right ankle and foot: Secondary | ICD-10-CM

## 2020-12-20 DIAGNOSIS — M2041 Other hammer toe(s) (acquired), right foot: Secondary | ICD-10-CM

## 2020-12-20 DIAGNOSIS — M2042 Other hammer toe(s) (acquired), left foot: Secondary | ICD-10-CM

## 2020-12-20 NOTE — Progress Notes (Signed)
  Subjective:  Patient ID: Debra Mcconnell, female    DOB: 07/03/1946,  MRN: [id number]  Chief Complaint  Patient presents with  . Ulcer    Callulitis F/U : per aide amp. Site looks better, but they are concern of new lesions at top of 2nd and 4th toes - no redness/swelling/draiange -lesion is closed and with scabbing no pain Tx: bandiad   DOS: 11/15/20 Procedure: Amputation 3rd toe right  75 y.o. female presents with the above complaint. History confirmed with patient.   Objective:  Physical Exam: no tenderness at the surgical site, local edema noted and calf supple, nontender. Hammertoes 2nd/4th toes with skin breakdown but no warmth,erythema, signs of infection noted.  Assessment:   1. Acute osteomyelitis of right ankle or foot ([place])   2. Acquired hammer toes of both feet    Plan:  Patient was evaluated and treated and all questions answered.  Post-operative State -Amputation site healed -Discussed 2nd/4th toe hammertoe formation with early ulcers. Likely 2/2 shoegear. Discussed abx ointment and band-aids to promote healing and protection and avoid shoes that will cause pressure -Will get appt for her for DM shoes.  Return in about 1 month (around 01/19/2021) for hammertoe f/u right.

## 2021-01-03 ENCOUNTER — Ambulatory Visit (INDEPENDENT_AMBULATORY_CARE_PROVIDER_SITE_OTHER): Payer: Medicare Other | Admitting: Podiatry

## 2021-01-03 ENCOUNTER — Encounter: Payer: Self-pay | Admitting: Podiatry

## 2021-01-03 ENCOUNTER — Other Ambulatory Visit: Payer: Self-pay

## 2021-01-03 DIAGNOSIS — M2042 Other hammer toe(s) (acquired), left foot: Secondary | ICD-10-CM

## 2021-01-03 DIAGNOSIS — M2041 Other hammer toe(s) (acquired), right foot: Secondary | ICD-10-CM | POA: Diagnosis not present

## 2021-01-03 NOTE — Progress Notes (Signed)
  Subjective:  Patient ID: Debra Mcconnell, female    DOB: 30-Sep-1945,  MRN: [id number]  Chief Complaint  Patient presents with  . Routine Post Op    I am doing good on the right foot but there is a spot on the 2nd toe and the 4th toe   DOS: 11/15/20 Procedure: Amputation 3rd toe right  75 y.o. female presents with the above complaint. History confirmed with patient.   Objective:  Physical Exam: no tenderness at the surgical site, local edema noted and calf supple, nontender. Hammertoes 2nd/4th toes with skin breakdown but no warmth,erythema, signs of infection noted.  Assessment:   1. Acquired hammer toes of both feet    Plan:  Patient was evaluated and treated and all questions answered.  Post-operative State -Amputation site healed -The lesser digit ulcers appear to be improving. Discussed continued abx ointment and band-aids. Dispensed silicone toe shields to prevent pressure formation. Again discussed best shoe gear to prevent recurrence of this issue. F/u in 1 month to ensure healing.  No follow-ups on file.

## 2021-01-16 ENCOUNTER — Other Ambulatory Visit: Payer: Self-pay

## 2021-01-16 ENCOUNTER — Ambulatory Visit (INDEPENDENT_AMBULATORY_CARE_PROVIDER_SITE_OTHER): Payer: Medicare Other | Admitting: Podiatry

## 2021-01-16 DIAGNOSIS — M2042 Other hammer toe(s) (acquired), left foot: Secondary | ICD-10-CM

## 2021-01-16 DIAGNOSIS — E1142 Type 2 diabetes mellitus with diabetic polyneuropathy: Secondary | ICD-10-CM

## 2021-01-16 DIAGNOSIS — M2041 Other hammer toe(s) (acquired), right foot: Secondary | ICD-10-CM

## 2021-01-16 NOTE — Progress Notes (Signed)
Patient presented for foam casting for 3 pair custom diabetic shoe inserts. Patient is measured with a [place] Device to be a size 11 wide  Diabetic shoes are chosen from the [employer] step catalog.   The shoes chosen are [id number]  The patient will be contacted when the shoes and inserts are ready to be picked up

## 2021-01-21 ENCOUNTER — Ambulatory Visit: Payer: Medicare Other | Admitting: Podiatry

## 2021-01-25 ENCOUNTER — Ambulatory Visit (INDEPENDENT_AMBULATORY_CARE_PROVIDER_SITE_OTHER): Payer: Medicare Other | Admitting: Cardiology

## 2021-01-25 ENCOUNTER — Other Ambulatory Visit: Payer: Self-pay

## 2021-01-25 ENCOUNTER — Encounter: Payer: Self-pay | Admitting: Cardiology

## 2021-01-25 VITALS — BP 120/90 | HR 62 | Ht 68.0 in | Wt 217.0 lb

## 2021-01-25 DIAGNOSIS — Z7901 Long term (current) use of anticoagulants: Secondary | ICD-10-CM | POA: Diagnosis not present

## 2021-01-25 DIAGNOSIS — I5022 Chronic systolic (congestive) heart failure: Secondary | ICD-10-CM | POA: Diagnosis not present

## 2021-01-25 DIAGNOSIS — E785 Hyperlipidemia, unspecified: Secondary | ICD-10-CM | POA: Diagnosis not present

## 2021-01-25 DIAGNOSIS — I484 Atypical atrial flutter: Secondary | ICD-10-CM

## 2021-01-25 DIAGNOSIS — I110 Hypertensive heart disease with heart failure: Secondary | ICD-10-CM | POA: Diagnosis not present

## 2021-01-25 MED ORDER — DAPAGLIFLOZIN PROPANEDIOL 10 MG PO TABS: 10.0000 mg | ORAL_TABLET | Freq: Every day | ORAL | 3 refills | Status: DC

## 2021-01-25 NOTE — Patient Instructions (Signed)
Medication Instructions:  Your physician has recommended you make the following change in your medication:  START: Farxiga 10 mg take one tablet by mouth daily.  *If you need a refill on your cardiac medications before your next appointment, please call your pharmacy*   Lab Work: Your physician recommends that you return for lab work in: TODAY BMP, ProBNP, Lipids If you have labs (blood work) drawn today and your tests are completely normal, you will receive your results only by: Marland Kitchen MyChart Message (if you have MyChart) OR . A paper copy in the mail If you have any lab test that is abnormal or we need to change your treatment, we will call you to review the results.   Testing/Procedures: Your physician has requested that you have an echocardiogram. Echocardiography is a painless test that uses sound waves to create images of your heart. It provides your doctor with information about the size and shape of your heart and how well your heart's chambers and valves are working. This procedure takes approximately one hour. There are no restrictions for this procedure.     Follow-Up: At [facility], you and your health needs are our priority.  As part of our continuing mission to provide you with exceptional heart care, we have created designated Provider Care Teams.  These Care Teams include your primary Cardiologist (physician) and Advanced Practice Providers (APPs -  Physician Assistants and Nurse Practitioners) who all work together to provide you with the care you need, when you need it.  We recommend signing up for the patient portal called "MyChart".  Sign up information is provided on this After Visit Summary.  MyChart is used to connect with patients for Virtual Visits (Telemedicine).  Patients are able to view lab/test results, encounter notes, upcoming appointments, etc.  Non-urgent messages can be sent to your provider as well.   To learn more about what you can do with MyChart, go to  [email or web address].    Your next appointment:   3 month(s)  The format for your next appointment:   In Person  Provider:   Shirlee More, MD   Other Instructions

## 2021-01-25 NOTE — Progress Notes (Signed)
Cardiology Office Note:    Date:  01/25/2021   ID:  ANNIEBELL BEDORE, DOB 03-25-1946, MRN [id number]  PCP:  Greig Right, MD  Cardiologist:  Shirlee More, MD    Referring MD: Greig Right, MD    ASSESSMENT:    1. Atypical atrial flutter ([place])   2. Chronic anticoagulation   3. Hypertensive heart disease with heart failure ([place])   4. Chronic systolic heart failure ([place])   5. Hyperlipidemia, unspecified hyperlipidemia type    PLAN:    In order of problems listed above:  1. Stable asymptomatic at this time no indication for pacemaker however if EF remains severely reduced would need to consider the merits of pacemaker ICD. 2. Continue anticoagulant 3. Stable BP at target she is on guideline directed therapy diuretic beta-blocker Entresto hydralazine and initiate SGLT2 inhibitor 4. Continue statin with history of stroke   Next appointment: 3 months   Medication Adjustments/Labs and Tests Ordered: Current medicines are reviewed at length with the patient today.  Concerns regarding medicines are outlined above.  No orders of the defined types were placed in this encounter.  No orders of the defined types were placed in this encounter.   Chief Complaint  Patient presents with  . Follow-up    History of Present Illness:    MARIVEL MCCLARTY is a 75 y.o. female with a hx of atrial fibrillation as her chronic cardiac rhythm hypertension left bundle branch block cardiomyopathy with severe left ventricular dysfunction heart failure and chronic anticoagulation for stroke last seen 11/19/2020.  At that visit her heart failure is compensated with no evidence of fluid overload she was continued on a loop diuretic beta-blocker and transition from ARB to [facility] with plans to recheck renal function and consider spironolactone at follow-up.  She has an event monitor for 7 days which showed atrial flutter is a chronic rhythm with controlled ventricular response and she did not have any  episodes of ventricular tachycardia  Compliance with diet, lifestyle and medications: Yes  Compliant with her medications.  We are going to intensify heart failure treatment adding SGLT2 inhibitor she has no history of pulmonary groin infection or diabetic infection.  She is having no angina dyspnea palpitations or syncope.  She does have fragile skin and has an excoriation on her left lower  Her discharge summary [facility] 10/20/2019 describe her as having acute viral myocarditis due to COVID-19 infection with cardiogenic shock on presentation to the hospital requiring vasopressors and also mechanical ventilation..  Her ejection fraction at that time was 25 to 30% with global hypokinesia initial echocardiogram showed an ejection fraction worsened in the range of 20%.  There is a notation she was not placed on an ARB because of hypotension in hospital.  Her discharge summary [facility] 10/20/2019 describe her as having acute viral myocarditis due to COVID-19 infection with cardiogenic shock on presentation to the hospital requiring vasopressors and also mechanical ventilation..  Her ejection fraction at that time was 25 to 30% with global hypokinesia initial echocardiogram showed an ejection fraction worsened in the range of 20%.  There is a notation she was not placed on an ARB because of hypotension in hospital.   Subsequently she had a echo performed at [facility] 08/17/2020 interpreted by my partner Dr. Harriet Masson showing the left ventricle to be dilated EF 30 to 35% elevated left atrial and left ventricular diastolic pressure the right ventricle had moderate systolic dysfunction both atrium were enlarged as well as  moderate mitral mild tricuspid regurgitation   Past Medical History:  Diagnosis Date  . Acquired hammer toes of both feet 01/22/2017  . Arthritis 11/29/2013  . Atrial flutter ([place]) 01/24/2014   Overview:  CHADS2 vasc score= 5  . B-complex deficiency 11/29/2013  .  Cerebral artery occlusion with cerebral infarction ([place]) 12/04/2013   Overview:  STORY: MRI +ve Left frontoparietal infarction (LMCA). +AFib.`E1o3L`IMPRESSION: BP is under controlled. Continue home health. +AFib, con't Eliquis 5mg  bid. Will obtain old record from [facility] for review.  . Chronic systolic (congestive) heart failure ([place])   . Diabetic polyneuropathy associated with type 2 diabetes mellitus ([place]) 01/22/2017  . Essential hypertension 11/13/2015  . Hyperlipemia, retention 11/13/2015  . OSA (obstructive sleep apnea) 01/24/2014   Overview:  IMPRESSION: Will obtain old record for review and ask DME to d/l compliance and fax the result to me. F/u in 6 wks.  . Pre-ulcerative corn or callous 01/22/2017  . Stroke ([place]) 01/24/2014  . Thyroid disease 11/29/2013  . 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 APPENDECTOMY    . CHOLECYSTECTOMY  2009  . [place]   arm  . 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 KNEE SURGERY  2001   Knee replacement    Current Medications: Current Meds  Medication Sig  . alendronate (FOSAMAX) 70 MG tablet Take 70 mg by mouth once a week.  Marland Kitchen atorvastatin (LIPITOR) 40 MG tablet Take 40 mg by mouth daily.  Marland Kitchen buPROPion (WELLBUTRIN XL) 300 MG 24 hr tablet Take 300 mg by mouth daily.  . carvedilol (COREG) 25 MG tablet Take 25 mg by mouth 2 (two) times daily with a meal.  . Cholecalciferol (VITAMIN D) 2000 units CAPS Take 4,000 Units by mouth 2 (two) times daily.  . Cyanocobalamin (VITAMIN B 12 PO) Take 1,000 mcg by mouth daily.  Marland Kitchen ELIQUIS 5 MG TABS tablet TAKE 1 TABLET TWICE A DAY  (MUST SCHEDULE APPOINTMENT FOR FOLLOW UP BEFORE ANY   ADDITIONAL REFILLS)  . Exenatide ER 2 MG SRER Inject 2 mg into the skin once a week.   . ferrous sulfate 325 (65 FE) MG EC tablet Take 325 mg by mouth 2 (two) times daily with a meal.  . furosemide (LASIX) 20 MG tablet Take  20 mg by mouth. Take 2 tablets (40 mg) in am and 1 tablet (20 mg) at noon  . gabapentin (NEURONTIN) 300 MG capsule 300 mg. Take 1 capsule am and noon, and 4 capsule (1200mg ) at bedtime  . glimepiride (AMARYL) 1 MG tablet Take 1 mg by mouth in the morning and at bedtime.  . hydrALAZINE (APRESOLINE) 25 MG tablet Take 25 mg by mouth 3 (three) times daily.  Marland Kitchen ketoconazole (NIZORAL) 2 % cream Apply topically at bedtime.  . Lancets (ONETOUCH DELICA PLUS LOVFIE33I) MISC Apply 1 each topically daily.  Marland Kitchen levothyroxine (SYNTHROID, LEVOTHROID) 175 MCG tablet Take 175 mcg by mouth daily.  Marland Kitchen nystatin ointment (MYCOSTATIN) APPLY TOPICALLY TO THE AFFECTED AREA TWICE DAILY  . omeprazole (PRILOSEC) 20 MG capsule Take 20 mg by mouth daily.   Glory Rosebush VERIO test strip daily.  . polycarbophil (FIBERCON) 625 MG tablet Take 625 mg by mouth daily.  . potassium chloride (KLOR-CON) 10 MEQ tablet Take 15 mEq by mouth daily.  . sacubitril-valsartan (ENTRESTO) 49-51 MG Take 1 tablet by mouth 2 (two) times daily.  Marland Kitchen zolpidem (AMBIEN) 5 MG  tablet Take 5 mg by mouth at bedtime as needed.     Allergies:   Gabapentin and Lisinopril   Social History   Socioeconomic History  . Marital status: Divorced    Spouse name: Not on file  . Number of children: Not on file  . Years of education: Not on file  . Highest education level: Not on file  Occupational History  . Not on file  Tobacco Use  . Smoking status: Former Research scientist (life sciences)  . Smokeless tobacco: Never Used  Vaping Use  . Vaping Use: Never used  Substance and Sexual Activity  . Alcohol use: No  . Drug use: No  . Sexual activity: Not on file  Other Topics Concern  . Not on file  Social History Narrative  . Not on file   Social Determinants of Health   Financial Resource Strain: Not on file  Food Insecurity: Not on file  Transportation Needs: Not on file  Physical Activity: Not on file  Stress: Not on file  Social Connections: Not on file     Family  History: The patient's family history includes Cancer in her father; Heart disease in her father and mother. ROS:   Please see the history of present illness.    All other systems reviewed and are negative.  EKGs/Labs/Other Studies Reviewed:    The following studies were reviewed today:    Recent Labs: 11/19/2020: BUN 46; Creatinine, Ser 1.42; NT-Pro BNP 7,426; Potassium 4.2; Sodium 146  Recent Lipid Panel    Component Value Date/Time   TRIG 148 10/04/2019 2107    Physical Exam:    VS:  BP 120/90 (BP Location: Right Arm, Patient Position: Sitting, Cuff Size: Normal)   Pulse 62   Ht 5\' 8"  (1.727 m)   Wt 217 lb (98.4 kg)   SpO2 95%   BMI 32.99 kg/m     Wt Readings from Last 3 Encounters:  01/25/21 217 lb (98.4 kg)  11/19/20 205 lb 6.4 oz (93.2 kg)  10/20/19 222 lb 3.6 oz (100.8 kg)     GEN:  Well nourished, well developed in no acute distress HEENT: Normal NECK: No JVD; No carotid bruits LYMPHATICS: No lymphadenopathy CARDIAC: Irregular rhythm , no murmurs, rubs, gallops RESPIRATORY:  Clear to auscultation without rales, wheezing or rhonchi  ABDOMEN: Soft, non-tender, non-distended MUSCULOSKELETAL:  No edema; No deformity  SKIN: Warm and dry NEUROLOGIC:  Alert and oriented x 3 PSYCHIATRIC:  Normal affect    Signed, Shirlee More, MD  01/25/2021 1:56 PM    [facility]

## 2021-01-26 LAB — LIPID PANEL
Chol/HDL Ratio: 2.2 ratio (ref 0.0–4.4)
Cholesterol, Total: 108 mg/dL (ref 100–199)
HDL: 50 mg/dL (ref >39)
LDL Chol Calc (NIH): 47 mg/dL (ref 0–99)
Triglycerides: 43 mg/dL (ref 0–149)
VLDL Cholesterol Cal: 11 mg/dL (ref 5–40)

## 2021-01-26 LAB — BASIC METABOLIC PANEL
BUN/Creatinine Ratio: 39 — ABNORMAL HIGH (ref 12–28)
BUN: 66 mg/dL — ABNORMAL HIGH (ref 8–27)
CO2: 20 mmol/L (ref 20–29)
Calcium: 8.6 mg/dL — ABNORMAL LOW (ref 8.7–10.3)
Chloride: 106 mmol/L (ref 96–106)
Creatinine, Ser: 1.71 mg/dL — ABNORMAL HIGH (ref 0.57–1.00)
Glucose: 60 mg/dL — ABNORMAL LOW (ref 65–99)
Potassium: 5 mmol/L (ref 3.5–5.2)
Sodium: 142 mmol/L (ref 134–144)
eGFR: 31 mL/min/{1.73_m2} — ABNORMAL LOW (ref >59)

## 2021-01-26 LAB — PRO B NATRIURETIC PEPTIDE: NT-Pro BNP: 6047 pg/mL — ABNORMAL HIGH (ref 0–301)

## 2021-01-31 ENCOUNTER — Ambulatory Visit (INDEPENDENT_AMBULATORY_CARE_PROVIDER_SITE_OTHER): Payer: Medicare Other | Admitting: Podiatry

## 2021-01-31 ENCOUNTER — Encounter: Payer: Self-pay | Admitting: Podiatry

## 2021-01-31 ENCOUNTER — Other Ambulatory Visit: Payer: Self-pay

## 2021-01-31 DIAGNOSIS — M2041 Other hammer toe(s) (acquired), right foot: Secondary | ICD-10-CM

## 2021-01-31 DIAGNOSIS — E1142 Type 2 diabetes mellitus with diabetic polyneuropathy: Secondary | ICD-10-CM

## 2021-01-31 DIAGNOSIS — M2042 Other hammer toe(s) (acquired), left foot: Secondary | ICD-10-CM

## 2021-01-31 NOTE — Progress Notes (Signed)
  Subjective:  Patient ID: Debra Mcconnell, female    DOB: September 06, 1946,  MRN: [id number]  Chief Complaint  Patient presents with  . Routine Post Op    I am doing ok on the right foot and the shoes are a little big but I do not wear them every day   DOS: 11/15/20 Procedure: Amputation 3rd toe right  75 y.o. female presents with the above complaint. History confirmed with patient.   Objective:  Physical Exam: no tenderness at the surgical site, local edema noted and calf supple, nontender. Hammertoes 2nd/4th toes with skin breakdown but no warmth,erythema, signs of infection noted.  Assessment:   1. Diabetic polyneuropathy associated with type 2 diabetes mellitus ([place])   2. Acquired hammer toes of both feet    Plan:  Patient was evaluated and treated and all questions answered.  Post-operative State -Amputation site healed -Lesser digits improving. Continue abx ointment and band-aid until healed. Continue silicone toe caps. Dispensed additional today.  Return in about 6 weeks (around 03/14/2021) for Wound Care.

## 2021-02-19 ENCOUNTER — Ambulatory Visit (INDEPENDENT_AMBULATORY_CARE_PROVIDER_SITE_OTHER): Payer: Medicare Other

## 2021-02-19 ENCOUNTER — Other Ambulatory Visit: Payer: Self-pay

## 2021-02-19 DIAGNOSIS — I484 Atypical atrial flutter: Secondary | ICD-10-CM

## 2021-02-19 LAB — ECHOCARDIOGRAM COMPLETE
Area-P 1/2: 5.06 cm2
Calc EF: 39.8 %
MV M vel: 5.04 m/s
MV Peak grad: 101.6 mmHg
Radius: 0.65 cm
S' Lateral: 4.5 cm
Single Plane A2C EF: 40 %
Single Plane A4C EF: 41.7 %

## 2021-02-19 NOTE — Progress Notes (Signed)
Complete echocardiogram performed.  Jimmy Onesha Krebbs RDCS, RVT  

## 2021-02-20 ENCOUNTER — Other Ambulatory Visit: Payer: Self-pay

## 2021-02-20 ENCOUNTER — Telehealth: Payer: Self-pay

## 2021-02-20 NOTE — Telephone Encounter (Signed)
Prior authorization ID # [id number]  for Farxiga 10 mg 1 tablet daily approved pharmacy use only. Authorized date 01/21/2021 thru 02/20/2022  per Olen Pel with [facility] made aware of approval

## 2021-03-02 ENCOUNTER — Other Ambulatory Visit: Payer: Self-pay | Admitting: Cardiology

## 2021-03-04 NOTE — Telephone Encounter (Signed)
39f, 98.4kg, scr 1.71 01/25/21, lovw/munley 01/25/21

## 2021-03-14 ENCOUNTER — Other Ambulatory Visit: Payer: Self-pay

## 2021-03-14 ENCOUNTER — Ambulatory Visit (INDEPENDENT_AMBULATORY_CARE_PROVIDER_SITE_OTHER): Payer: Medicare Other | Admitting: Podiatry

## 2021-03-14 DIAGNOSIS — B351 Tinea unguium: Secondary | ICD-10-CM | POA: Diagnosis not present

## 2021-03-14 DIAGNOSIS — E1142 Type 2 diabetes mellitus with diabetic polyneuropathy: Secondary | ICD-10-CM

## 2021-03-14 DIAGNOSIS — M2041 Other hammer toe(s) (acquired), right foot: Secondary | ICD-10-CM | POA: Diagnosis not present

## 2021-03-14 DIAGNOSIS — M2042 Other hammer toe(s) (acquired), left foot: Secondary | ICD-10-CM

## 2021-03-14 DIAGNOSIS — M79675 Pain in left toe(s): Secondary | ICD-10-CM

## 2021-03-14 DIAGNOSIS — M79674 Pain in right toe(s): Secondary | ICD-10-CM | POA: Diagnosis not present

## 2021-03-14 NOTE — Progress Notes (Signed)
  Subjective:  Patient ID: Debra Mcconnell, female    DOB: [birth year],  MRN: [id number]  No chief complaint on file.  DOS: 11/15/20 Procedure: Amputation 3rd toe right  75 y.o. female presents with the above complaint. History confirmed with patient.   Objective:  Physical Exam: no tenderness at the surgical site, local edema noted and calf supple, nontender. Hammertoes 2nd/4th toes without active skin breakdown. No warmth,erythema, signs of infection noted.  Assessment:   1. Pain due to onychomycosis of toenails of both feet   2. Diabetic polyneuropathy associated with type 2 diabetes mellitus ([place])   3. Acquired hammer toes of both feet     Plan:  Patient was evaluated and treated and all questions answered.  Post-operative State -Amputation site remains healed.  Hammertoes with ulceration -Lesser toes doing well. Ulcerations appear healed. Continue toe caps. Awaiting new DM shoes.   Onychomycosis, Hx toe amputation -Nails debrided x9  Procedure: Nail Debridement Type of Debridement: manual, sharp debridement. Instrumentation: Nail nipper, rotary burr. Number of Nails: 9    No follow-ups on file.

## 2021-03-18 DIAGNOSIS — I10 Essential (primary) hypertension: Secondary | ICD-10-CM | POA: Diagnosis not present

## 2021-03-18 DIAGNOSIS — E7800 Pure hypercholesterolemia, unspecified: Secondary | ICD-10-CM | POA: Diagnosis not present

## 2021-03-18 DIAGNOSIS — E038 Other specified hypothyroidism: Secondary | ICD-10-CM | POA: Diagnosis not present

## 2021-03-18 DIAGNOSIS — E113493 Type 2 diabetes mellitus with severe nonproliferative diabetic retinopathy without macular edema, bilateral: Secondary | ICD-10-CM | POA: Diagnosis not present

## 2021-03-18 DIAGNOSIS — E1140 Type 2 diabetes mellitus with diabetic neuropathy, unspecified: Secondary | ICD-10-CM | POA: Diagnosis not present

## 2021-03-18 DIAGNOSIS — N1832 Chronic kidney disease, stage 3b: Secondary | ICD-10-CM | POA: Diagnosis not present

## 2021-03-18 DIAGNOSIS — I129 Hypertensive chronic kidney disease with stage 1 through stage 4 chronic kidney disease, or unspecified chronic kidney disease: Secondary | ICD-10-CM | POA: Diagnosis not present

## 2021-03-18 DIAGNOSIS — E669 Obesity, unspecified: Secondary | ICD-10-CM | POA: Diagnosis not present

## 2021-03-18 DIAGNOSIS — Z79899 Other long term (current) drug therapy: Secondary | ICD-10-CM | POA: Diagnosis not present

## 2021-03-18 DIAGNOSIS — Z6836 Body mass index (BMI) 36.0-36.9, adult: Secondary | ICD-10-CM | POA: Diagnosis not present

## 2021-03-18 DIAGNOSIS — E1129 Type 2 diabetes mellitus with other diabetic kidney complication: Secondary | ICD-10-CM | POA: Diagnosis not present

## 2021-04-07 DIAGNOSIS — E7800 Pure hypercholesterolemia, unspecified: Secondary | ICD-10-CM | POA: Diagnosis not present

## 2021-04-07 DIAGNOSIS — I10 Essential (primary) hypertension: Secondary | ICD-10-CM | POA: Diagnosis not present

## 2021-04-18 ENCOUNTER — Ambulatory Visit (INDEPENDENT_AMBULATORY_CARE_PROVIDER_SITE_OTHER): Payer: Medicare Other | Admitting: Podiatry

## 2021-04-18 ENCOUNTER — Other Ambulatory Visit: Payer: Self-pay

## 2021-04-18 ENCOUNTER — Encounter: Payer: Self-pay | Admitting: Podiatry

## 2021-04-18 DIAGNOSIS — E1142 Type 2 diabetes mellitus with diabetic polyneuropathy: Secondary | ICD-10-CM | POA: Diagnosis not present

## 2021-04-18 DIAGNOSIS — M2042 Other hammer toe(s) (acquired), left foot: Secondary | ICD-10-CM | POA: Diagnosis not present

## 2021-04-18 DIAGNOSIS — M2041 Other hammer toe(s) (acquired), right foot: Secondary | ICD-10-CM | POA: Diagnosis not present

## 2021-04-18 NOTE — Progress Notes (Signed)
  Subjective:  Patient ID: Debra Mcconnell, female    DOB: 27-Jan-1946,  MRN: [id number]  Chief Complaint  Patient presents with   Routine Post Op    I am doing good on the right foot and I am here to get the diabetic shoes   DOS: 11/15/20 Procedure: Amputation 3rd toe right  75 y.o. female presents with the above complaint. History confirmed with patient.   Objective:  Physical Exam: no tenderness at the surgical site, local edema noted and calf supple, nontender. Hammertoes 2nd/4th toes without active skin breakdown but with mild maceration of the skin. No warmth,erythema, signs of infection noted.  Assessment:   1. Diabetic polyneuropathy associated with type 2 diabetes mellitus ([place])   2. Acquired hammer toes of both feet    Plan:  Patient was evaluated and treated and all questions answered.  Post-operative State -Amputation site remains healed.  Hammertoes with ulceration -Improving, d/c silicone toe caps 2/2 maceration.  DM with DPN, Hammertoes -Dispensed DM shoes today. Written and verbal break in instructions reviewed with patient. Pt to call promptly with any concerns.  No follow-ups on file.

## 2021-05-05 NOTE — Progress Notes (Signed)
Cardiology Office Note:    Date:  05/06/2021   ID:  NKECHINYERE FERDERER, DOB 02/09/1946, MRN [id number]  PCP:  Greig Right, MD  Cardiologist:  Shirlee More, MD    Referring MD: Greig Right, MD    ASSESSMENT:    1. Atypical atrial flutter ([place])   2. Chronic anticoagulation   3. Hypertensive heart disease with heart failure ([place])   4. Chronic systolic heart failure (HCC)    PLAN:    In order of problems listed above:  She continues to do well with her atrial arrhythmia rate control with beta-blocker and anticoagulated and heart failure is compensated she will continue her low-dose loop diuretic beta-blocker uptitrate Entresto target dose and she is on an SGLT2 inhibitor next visit consider MRA.  Unless her home health aide track her blood pressures and heart rates at home.  If her getting rates consistently less than she will need ambulatory heart rhythm monitoring.  Next appointment: 6 months   Medication Adjustments/Labs and Tests Ordered: Current medicines are reviewed at length with the patient today.  Concerns regarding medicines are outlined above.  No orders of the defined types were placed in this encounter.  Meds ordered this encounter  Medications   sacubitril-valsartan (ENTRESTO) 97-103 MG    Sig: Take 1 tablet by mouth 2 (two) times daily.    Dispense:  180 tablet    Refill:  3     Chief Complaint  Patient presents with   Follow-up   Congestive Heart Failure     History of Present Illness:    Debra Mcconnell is a 75 y.o. female with a hx of chronic atrial fibrillation hypertensive heart disease left bundle branch block cardiomyopathy with severe left ventricular dysfunction stroke and chronic anticoagulation last seen 01/25/2021.  Compliance with diet, lifestyle and medications: Yes  In terms of heart failure she is doing well no edema shortness of breath her blood pressure runs 1 [id number] systolic we will uptitrate Entresto. Her home health aide is with  her and heart rates from 50 to 60 bpm and I told him to contact me if they getting rates of less than 45 with her chronic atrial fibrillation and flutter. She remains anticoagulated she has had no bleeding He tolerates her statin without muscle pain or weakness Overall pleased with the quality of the month  Her discharge summary [facility] 10/20/2019 describe her as having acute viral myocarditis due to COVID-19 infection with cardiogenic shock on presentation to the hospital requiring vasopressors and also mechanical ventilation..  Her ejection fraction at that time was 25 to 30% with global hypokinesia initial echocardiogram showed an ejection fraction worsened in the range of 20%.  There is a notation she was not placed on an ARB because of hypotension in hospital.  Her discharge summary [facility] 10/20/2019 describe her as having acute viral myocarditis due to COVID-19 infection with cardiogenic shock on presentation to the hospital requiring vasopressors and also mechanical ventilation..  Her ejection fraction at that time was 25 to 30% with global hypokinesia initial echocardiogram showed an ejection fraction worsened in the range of 20%.  There is a notation she was not placed on an ARB because of hypotension in hospital.    Subsequently she had a echo performed at [facility] 08/17/2020 interpreted by my partner Dr. Harriet Masson showing the left ventricle to be dilated EF 30 to 35% elevated left atrial and left ventricular diastolic pressure the right ventricle had moderate systolic  dysfunction both atrium were enlarged as well as moderate mitral mild tricuspid regurgitation   An echocardiogram 03/01/2021 I do not know why but there is no report posted in epic I independently reviewed the echo with an ejection fraction in the range of 30 to 35% severe left ventricular hypertrophy severe left atrial enlargement moderate mitral regurgitation and moderate right ventricular  dysfunction.  Recent laboratory tests from PCP office 03/20/2021 shows a hemoglobin of 10.9 creatinine 32.8 she is mildly anemic platelets 153,000 sodium 143 potassium 4.1 creatinine 1.2 GFR 37 hemoglobin A1c 5.3% TSH normal 2.05. Past Medical History:  Diagnosis Date   Acquired hammer toes of both feet 01/22/2017   Arthritis 11/29/2013   Atrial flutter ([place]) 01/24/2014   Overview:  CHADS2 vasc score= 5   B-complex deficiency 11/29/2013   Cerebral artery occlusion with cerebral infarction ([place]) 12/04/2013   Overview:  STORY: MRI +ve Left frontoparietal infarction (LMCA). +AFib.`E1o3L`IMPRESSION: BP is under controlled. Continue home health. +AFib, con't Eliquis '5mg'$  bid. Will obtain old record from [facility] for review.   Chronic systolic (congestive) heart failure (HCC)    Diabetic polyneuropathy associated with type 2 diabetes mellitus ([place]) 01/22/2017   Essential hypertension 11/13/2015   Hyperlipemia, retention 11/13/2015   OSA (obstructive sleep apnea) 01/24/2014   Overview:  IMPRESSION: Will obtain old record for review and ask DME to d/l compliance and fax the result to me. F/u in 6 wks.   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   atorvastatin (LIPITOR) 40 MG tablet Take 40 mg by mouth daily.   buPROPion (WELLBUTRIN XL) 300 MG 24 hr tablet Take 300 mg by mouth daily.   carvedilol (COREG) 25 MG tablet Take 25 mg by mouth 2 (two) times daily with a meal.   Cholecalciferol (VITAMIN D) 2000 units CAPS Take 4,000 Units by  mouth 2 (two) times daily.   Cyanocobalamin (VITAMIN B 12 PO) Take 1,000 mcg by mouth daily.   dapagliflozin propanediol (FARXIGA) 10 MG TABS tablet Take 1 tablet (10 mg total) by mouth daily before breakfast.   Eliquis 5 MG TABS TAKE 1 TABLET TWICE A DAY   Exenatide ER 2 MG SRER Inject 2 mg into the skin once a week.    ferrous sulfate 325 (65 FE) MG EC tablet Take 325 mg by mouth 2 (two) times daily with a meal.   furosemide (LASIX) 20 MG tablet Take 20 mg by mouth. Take 2 tablets (40 mg) in am and 1 tablet (20 mg) at noon   gabapentin (NEURONTIN) 300 MG capsule 300 mg. Take 1 capsule am and noon, and 4 capsule ('1200mg'$ ) at bedtime   glimepiride (AMARYL) 1 MG tablet Take 1 mg by mouth in the morning and at bedtime.   hydrALAZINE (APRESOLINE) 25 MG tablet Take 25 mg by mouth 3 (three) times daily.   ketoconazole (NIZORAL) 2 % cream Apply topically at bedtime.   Lancets (ONETOUCH DELICA PLUS 123XX123) MISC Apply 1 each topically  daily.   levothyroxine (SYNTHROID, LEVOTHROID) 175 MCG tablet Take 175 mcg by mouth daily.   nystatin ointment (MYCOSTATIN) APPLY TOPICALLY TO THE AFFECTED AREA TWICE DAILY   omeprazole (PRILOSEC) 20 MG capsule Take 20 mg by mouth daily.    ONETOUCH VERIO test strip daily.   polycarbophil (FIBERCON) 625 MG tablet Take 625 mg by mouth daily.   potassium chloride (KLOR-CON) 10 MEQ tablet Take 15 mEq by mouth daily.   sacubitril-valsartan (ENTRESTO) 97-103 MG Take 1 tablet by mouth 2 (two) times daily.   zolpidem (AMBIEN) 5 MG tablet Take 5 mg by mouth at bedtime as needed.   [DISCONTINUED] sacubitril-valsartan (ENTRESTO) 49-51 MG Take 1 tablet by mouth 2 (two) times daily.     Allergies:   Gabapentin and Lisinopril   Social History   Socioeconomic History   Marital status: Divorced    Spouse name: Not on file   Number of children: Not on file   Years of education: Not on file   Highest education level: Not on file  Occupational History   Not on file  Tobacco  Use   Smoking status: Former   Smokeless tobacco: Never  Vaping Use   Vaping Use: Never used  Substance and Sexual Activity   Alcohol use: No   Drug use: No   Sexual activity: Not on file  Other Topics Concern   Not on file  Social History Narrative   Not on file   Social Determinants of Health   Financial Resource Strain: Not on file  Food Insecurity: Not on file  Transportation Needs: Not on file  Physical Activity: Not on file  Stress: Not on file  Social Connections: Not on file     Family History: The patient's family history includes Cancer in her father; Heart disease in her father and mother. ROS:   Please see the history of present illness.    All other systems reviewed and are negative.  EKGs/Labs/Other Studies Reviewed:    The following studies were reviewed today:    Recent Labs: 01/25/2021: BUN 66; Creatinine, Ser 1.71; NT-Pro BNP 6,047; Potassium 5.0; Sodium 142  Recent Lipid Panel    Component Value Date/Time   CHOL 108 01/25/2021 1628   TRIG 43 01/25/2021 1628   HDL 50 01/25/2021 1628   CHOLHDL 2.2 01/25/2021 1628   LDLCALC 47 01/25/2021 1628    Physical Exam:    VS:  BP (!) 148/76 (BP Location: Right Arm, Patient Position: Sitting)   Pulse (!) 58   Ht '5\' 6"'$  (1.676 m)   Wt 207 lb 9.6 oz (94.2 kg)   SpO2 97%   BMI 33.51 kg/m     Wt Readings from Last 3 Encounters:  05/06/21 207 lb 9.6 oz (94.2 kg)  01/25/21 217 lb (98.4 kg)  11/19/20 205 lb 6.4 oz (93.2 kg)     GEN:  Well nourished, well developed in no acute distress HEENT: Normal NECK: No JVD; No carotid bruits LYMPHATICS: No lymphadenopathy CARDIAC: Irregular rate and rhythm  no murmurs, rubs, gallops RESPIRATORY:  Clear to auscultation without rales, wheezing or rhonchi  ABDOMEN: Soft, non-tender, non-distended MUSCULOSKELETAL:  No edema; No deformity  SKIN: Warm and dry NEUROLOGIC:  Alert and oriented x 3 PSYCHIATRIC:  Normal affect    Signed, Shirlee More, MD   05/06/2021 10:34 AM    [facility]

## 2021-05-06 ENCOUNTER — Other Ambulatory Visit: Payer: Self-pay

## 2021-05-06 ENCOUNTER — Ambulatory Visit (INDEPENDENT_AMBULATORY_CARE_PROVIDER_SITE_OTHER): Payer: Medicare Other | Admitting: Cardiology

## 2021-05-06 ENCOUNTER — Encounter: Payer: Self-pay | Admitting: Cardiology

## 2021-05-06 VITALS — BP 148/76 | HR 58 | Ht 66.0 in | Wt 207.6 lb

## 2021-05-06 DIAGNOSIS — I110 Hypertensive heart disease with heart failure: Secondary | ICD-10-CM | POA: Diagnosis not present

## 2021-05-06 DIAGNOSIS — I484 Atypical atrial flutter: Secondary | ICD-10-CM | POA: Diagnosis not present

## 2021-05-06 DIAGNOSIS — I5022 Chronic systolic (congestive) heart failure: Secondary | ICD-10-CM | POA: Diagnosis not present

## 2021-05-06 DIAGNOSIS — Z7901 Long term (current) use of anticoagulants: Secondary | ICD-10-CM

## 2021-05-06 MED ORDER — SACUBITRIL-VALSARTAN 97-103 MG PO TABS: 1.0000 | ORAL_TABLET | Freq: Two times a day (BID) | ORAL | 3 refills | Status: DC

## 2021-05-06 NOTE — Patient Instructions (Signed)
Medication Instructions:  Your physician has recommended you make the following change in your medication:  INCREASE: Entresto 97/103 mg take one tablet by mouth twice daily.  *If you need a refill on your cardiac medications before your next appointment, please call your pharmacy*   Lab Work: None If you have labs (blood work) drawn today and your tests are completely normal, you will receive your results only by: [place] (if you have MyChart) OR A paper copy in the mail If you have any lab test that is abnormal or we need to change your treatment, we will call you to review the results.   Testing/Procedures: None   Follow-Up: At [facility], you and your health needs are our priority.  As part of our continuing mission to provide you with exceptional heart care, we have created designated Provider Care Teams.  These Care Teams include your primary Cardiologist (physician) and Advanced Practice Providers (APPs -  Physician Assistants and Nurse Practitioners) who all work together to provide you with the care you need, when you  need it.  We recommend signing up for the patient portal called "MyChart".  Sign up information is provided on this After Visit Summary.  MyChart is used to connect with patients for Virtual Visits (Telemedicine).  Patients are able to view lab/test results, encounter notes, upcoming appointments, etc.  Non-urgent messages can be sent to your provider as well.   To learn more about what you can do with MyChart, go to [email or web address].    Your next appointment:   6 month(s)  The format for your next appointment:   In Person  Provider:   Shirlee More, MD   Other Instructions

## 2021-05-15 ENCOUNTER — Telehealth: Payer: Self-pay | Admitting: Cardiology

## 2021-05-15 MED ORDER — SACUBITRIL-VALSARTAN 97-103 MG PO TABS: 1.0000 | ORAL_TABLET | Freq: Two times a day (BID) | ORAL | 3 refills | Status: DC

## 2021-05-15 NOTE — Telephone Encounter (Signed)
*  STAT* If patient is at the pharmacy, call can be transferred to refill team.   1. Which medications need to be refilled? (please list name of each medication and dose if known) new prescription for Entresto 97-103 mg  2. Which pharmacy/location (including street and city if local pharmacy) is medication to be sent to? [facility]    3. Do they need a 30 day or 90 day supply? 90 days and refills

## 2021-05-15 NOTE — Telephone Encounter (Signed)
Refill sent in per request.  

## 2021-06-06 ENCOUNTER — Telehealth: Payer: Self-pay | Admitting: *Deleted

## 2021-06-06 ENCOUNTER — Ambulatory Visit: Payer: Medicare Other | Admitting: Podiatry

## 2021-06-06 NOTE — Telephone Encounter (Signed)
Called and spoke with [name] (neighbor) and she stated that the patient was doing fine and I stated to call the office if any concerns or questions. Debra Mcconnell

## 2021-06-07 DIAGNOSIS — I10 Essential (primary) hypertension: Secondary | ICD-10-CM | POA: Diagnosis not present

## 2021-06-07 DIAGNOSIS — E7800 Pure hypercholesterolemia, unspecified: Secondary | ICD-10-CM | POA: Diagnosis not present

## 2021-06-24 ENCOUNTER — Ambulatory Visit: Payer: Medicare Other | Admitting: Podiatry

## 2021-06-24 DIAGNOSIS — E113493 Type 2 diabetes mellitus with severe nonproliferative diabetic retinopathy without macular edema, bilateral: Secondary | ICD-10-CM | POA: Diagnosis not present

## 2021-06-24 DIAGNOSIS — Z6835 Body mass index (BMI) 35.0-35.9, adult: Secondary | ICD-10-CM | POA: Diagnosis not present

## 2021-06-24 DIAGNOSIS — I129 Hypertensive chronic kidney disease with stage 1 through stage 4 chronic kidney disease, or unspecified chronic kidney disease: Secondary | ICD-10-CM | POA: Diagnosis not present

## 2021-06-24 DIAGNOSIS — Z79899 Other long term (current) drug therapy: Secondary | ICD-10-CM | POA: Diagnosis not present

## 2021-06-24 DIAGNOSIS — E7800 Pure hypercholesterolemia, unspecified: Secondary | ICD-10-CM | POA: Diagnosis not present

## 2021-06-24 DIAGNOSIS — E1129 Type 2 diabetes mellitus with other diabetic kidney complication: Secondary | ICD-10-CM | POA: Diagnosis not present

## 2021-06-24 DIAGNOSIS — E1140 Type 2 diabetes mellitus with diabetic neuropathy, unspecified: Secondary | ICD-10-CM | POA: Diagnosis not present

## 2021-06-24 DIAGNOSIS — E038 Other specified hypothyroidism: Secondary | ICD-10-CM | POA: Diagnosis not present

## 2021-06-24 DIAGNOSIS — I10 Essential (primary) hypertension: Secondary | ICD-10-CM | POA: Diagnosis not present

## 2021-06-24 DIAGNOSIS — E669 Obesity, unspecified: Secondary | ICD-10-CM | POA: Diagnosis not present

## 2021-06-24 DIAGNOSIS — N1832 Chronic kidney disease, stage 3b: Secondary | ICD-10-CM | POA: Diagnosis not present

## 2021-06-27 ENCOUNTER — Ambulatory Visit: Payer: Medicare Other | Admitting: Podiatry

## 2021-07-04 ENCOUNTER — Encounter: Payer: Self-pay | Admitting: Podiatry

## 2021-07-04 ENCOUNTER — Other Ambulatory Visit: Payer: Self-pay

## 2021-07-04 ENCOUNTER — Ambulatory Visit (INDEPENDENT_AMBULATORY_CARE_PROVIDER_SITE_OTHER): Payer: Medicare Other | Admitting: Podiatry

## 2021-07-04 DIAGNOSIS — E1142 Type 2 diabetes mellitus with diabetic polyneuropathy: Secondary | ICD-10-CM | POA: Diagnosis not present

## 2021-07-04 DIAGNOSIS — B351 Tinea unguium: Secondary | ICD-10-CM

## 2021-07-04 DIAGNOSIS — E1169 Type 2 diabetes mellitus with other specified complication: Secondary | ICD-10-CM

## 2021-07-05 NOTE — Progress Notes (Signed)
  Subjective:  Patient ID: Debra Mcconnell, female    DOB: [birth year],  MRN: [id number]  Chief Complaint  Patient presents with   Nail Problem    Trim nails    DOS: 11/15/20 Procedure: Amputation 3rd toe right  75 y.o. female presents with the above complaint. History confirmed with patient. States the nails are thick and would like them taken care of today.  Objective:  Physical Exam: no tenderness at the surgical site, local edema noted and calf supple, nontender. Hammertoes 2nd/4th toes without skin breakdown  No warmth,erythema, signs of infection noted. Absent protective sensation, nails thickened x10  Assessment:   1. 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  Post-operative State -Amputation site remains healed.  Hammertoes with ulceration -All healed  Onychomycosis, DM DPN -Nails debrided x10  Procedure: Nail Debridement Type of Debridement: manual, sharp debridement. Instrumentation: Nail nipper, rotary burr. Number of Nails: 10

## 2021-07-11 DIAGNOSIS — M19012 Primary osteoarthritis, left shoulder: Secondary | ICD-10-CM | POA: Diagnosis not present

## 2021-07-25 ENCOUNTER — Other Ambulatory Visit: Payer: Self-pay | Admitting: Cardiology

## 2021-07-25 NOTE — Telephone Encounter (Signed)
Prescription refill request for Eliquis received. Indication:Aflutter Last office visit:8/22 Scr:1.4 Age: 75 Weight:94.2 kg  Prescription refilled

## 2021-08-07 DIAGNOSIS — E1140 Type 2 diabetes mellitus with diabetic neuropathy, unspecified: Secondary | ICD-10-CM | POA: Diagnosis not present

## 2021-08-07 DIAGNOSIS — Z23 Encounter for immunization: Secondary | ICD-10-CM | POA: Diagnosis not present

## 2021-08-07 DIAGNOSIS — I4892 Unspecified atrial flutter: Secondary | ICD-10-CM | POA: Diagnosis not present

## 2021-08-07 DIAGNOSIS — N1832 Chronic kidney disease, stage 3b: Secondary | ICD-10-CM | POA: Diagnosis not present

## 2021-08-07 DIAGNOSIS — I5022 Chronic systolic (congestive) heart failure: Secondary | ICD-10-CM | POA: Diagnosis not present

## 2021-08-07 DIAGNOSIS — I10 Essential (primary) hypertension: Secondary | ICD-10-CM | POA: Diagnosis not present

## 2021-08-07 DIAGNOSIS — E1129 Type 2 diabetes mellitus with other diabetic kidney complication: Secondary | ICD-10-CM | POA: Diagnosis not present

## 2021-08-13 DIAGNOSIS — M25412 Effusion, left shoulder: Secondary | ICD-10-CM | POA: Diagnosis not present

## 2021-08-13 DIAGNOSIS — M19012 Primary osteoarthritis, left shoulder: Secondary | ICD-10-CM | POA: Diagnosis not present

## 2021-08-13 DIAGNOSIS — M25512 Pain in left shoulder: Secondary | ICD-10-CM | POA: Diagnosis not present

## 2021-08-13 DIAGNOSIS — M75102 Unspecified rotator cuff tear or rupture of left shoulder, not specified as traumatic: Secondary | ICD-10-CM | POA: Diagnosis not present

## 2021-08-13 DIAGNOSIS — R600 Localized edema: Secondary | ICD-10-CM | POA: Diagnosis not present

## 2021-08-19 DIAGNOSIS — E669 Obesity, unspecified: Secondary | ICD-10-CM | POA: Diagnosis not present

## 2021-08-19 DIAGNOSIS — Z6834 Body mass index (BMI) 34.0-34.9, adult: Secondary | ICD-10-CM | POA: Diagnosis not present

## 2021-08-19 DIAGNOSIS — I6990 Unspecified sequelae of unspecified cerebrovascular disease: Secondary | ICD-10-CM | POA: Diagnosis not present

## 2021-08-19 DIAGNOSIS — E7800 Pure hypercholesterolemia, unspecified: Secondary | ICD-10-CM | POA: Diagnosis not present

## 2021-08-19 DIAGNOSIS — D631 Anemia in chronic kidney disease: Secondary | ICD-10-CM | POA: Diagnosis not present

## 2021-08-19 DIAGNOSIS — I10 Essential (primary) hypertension: Secondary | ICD-10-CM | POA: Diagnosis not present

## 2021-08-19 DIAGNOSIS — M810 Age-related osteoporosis without current pathological fracture: Secondary | ICD-10-CM | POA: Diagnosis not present

## 2021-08-19 DIAGNOSIS — N1832 Chronic kidney disease, stage 3b: Secondary | ICD-10-CM | POA: Diagnosis not present

## 2021-08-19 DIAGNOSIS — Z0000 Encounter for general adult medical examination without abnormal findings: Secondary | ICD-10-CM | POA: Diagnosis not present

## 2021-08-19 DIAGNOSIS — E113493 Type 2 diabetes mellitus with severe nonproliferative diabetic retinopathy without macular edema, bilateral: Secondary | ICD-10-CM | POA: Diagnosis not present

## 2021-08-19 DIAGNOSIS — E538 Deficiency of other specified B group vitamins: Secondary | ICD-10-CM | POA: Diagnosis not present

## 2021-08-19 DIAGNOSIS — E038 Other specified hypothyroidism: Secondary | ICD-10-CM | POA: Diagnosis not present

## 2021-08-20 DIAGNOSIS — M19012 Primary osteoarthritis, left shoulder: Secondary | ICD-10-CM | POA: Diagnosis not present

## 2021-09-05 ENCOUNTER — Ambulatory Visit (INDEPENDENT_AMBULATORY_CARE_PROVIDER_SITE_OTHER): Payer: Medicare Other | Admitting: Podiatry

## 2021-09-05 ENCOUNTER — Other Ambulatory Visit: Payer: Self-pay

## 2021-09-05 ENCOUNTER — Encounter: Payer: Self-pay | Admitting: Podiatry

## 2021-09-05 DIAGNOSIS — E1142 Type 2 diabetes mellitus with diabetic polyneuropathy: Secondary | ICD-10-CM

## 2021-09-05 DIAGNOSIS — E1169 Type 2 diabetes mellitus with other specified complication: Secondary | ICD-10-CM

## 2021-09-05 DIAGNOSIS — B351 Tinea unguium: Secondary | ICD-10-CM

## 2021-09-05 NOTE — Patient Instructions (Signed)

## 2021-09-10 DIAGNOSIS — E113493 Type 2 diabetes mellitus with severe nonproliferative diabetic retinopathy without macular edema, bilateral: Secondary | ICD-10-CM | POA: Diagnosis not present

## 2021-09-10 DIAGNOSIS — Z961 Presence of intraocular lens: Secondary | ICD-10-CM | POA: Diagnosis not present

## 2021-09-10 DIAGNOSIS — H01009 Unspecified blepharitis unspecified eye, unspecified eyelid: Secondary | ICD-10-CM | POA: Diagnosis not present

## 2021-09-10 DIAGNOSIS — H524 Presbyopia: Secondary | ICD-10-CM | POA: Diagnosis not present

## 2021-09-10 DIAGNOSIS — Z7984 Long term (current) use of oral hypoglycemic drugs: Secondary | ICD-10-CM | POA: Diagnosis not present

## 2021-09-10 DIAGNOSIS — H47213 Primary optic atrophy, bilateral: Secondary | ICD-10-CM | POA: Diagnosis not present

## 2021-09-12 NOTE — Progress Notes (Signed)
°  Subjective:  Patient ID: Debra Mcconnell, female    DOB: 21-Mar-1946,  MRN: [id number]  Chief Complaint  Patient presents with   Nail Problem    Trim nails and I got new brook shoes and I do like them   DOS: 11/15/20 Procedure: Amputation 3rd toe right  76 y.o. female presents with the above complaint. History confirmed with patient. Nails are thick and painful today.  Objective:  Physical Exam: no tenderness at the surgical site, local edema noted and calf supple, nontender. Hammertoes 2nd/4th toes without skin breakdown  No warmth,erythema, signs of infection noted. Absent protective sensation, nails thickened x10  Assessment:   1. 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  Post-operative State -Amputation site remains healed.  Hammertoes with ulceration -All healed, but remain high risk. Advised patient to be vigilant about any skin breakdown.  Onychomycosis, DM DPN -Nails debrided x10   Procedure: Nail Debridement Type of Debridement: manual, sharp debridement. Instrumentation: Nail nipper, rotary burr. Number of Nails: 10

## 2021-09-24 DIAGNOSIS — Z79899 Other long term (current) drug therapy: Secondary | ICD-10-CM | POA: Diagnosis not present

## 2021-09-24 DIAGNOSIS — N1832 Chronic kidney disease, stage 3b: Secondary | ICD-10-CM | POA: Diagnosis not present

## 2021-10-06 DIAGNOSIS — E7800 Pure hypercholesterolemia, unspecified: Secondary | ICD-10-CM | POA: Diagnosis not present

## 2021-10-06 DIAGNOSIS — I10 Essential (primary) hypertension: Secondary | ICD-10-CM | POA: Diagnosis not present

## 2021-10-06 DIAGNOSIS — I129 Hypertensive chronic kidney disease with stage 1 through stage 4 chronic kidney disease, or unspecified chronic kidney disease: Secondary | ICD-10-CM | POA: Diagnosis not present

## 2021-10-07 ENCOUNTER — Ambulatory Visit (INDEPENDENT_AMBULATORY_CARE_PROVIDER_SITE_OTHER): Payer: Medicare Other | Admitting: Podiatry

## 2021-10-07 ENCOUNTER — Encounter: Payer: Self-pay | Admitting: Podiatry

## 2021-10-07 ENCOUNTER — Other Ambulatory Visit: Payer: Self-pay | Admitting: *Deleted

## 2021-10-07 ENCOUNTER — Ambulatory Visit (INDEPENDENT_AMBULATORY_CARE_PROVIDER_SITE_OTHER): Payer: Medicare Other

## 2021-10-07 VITALS — Temp 97.1°F

## 2021-10-07 DIAGNOSIS — E11621 Type 2 diabetes mellitus with foot ulcer: Secondary | ICD-10-CM | POA: Diagnosis not present

## 2021-10-07 DIAGNOSIS — L97522 Non-pressure chronic ulcer of other part of left foot with fat layer exposed: Secondary | ICD-10-CM

## 2021-10-07 DIAGNOSIS — I739 Peripheral vascular disease, unspecified: Secondary | ICD-10-CM | POA: Diagnosis not present

## 2021-10-07 DIAGNOSIS — I998 Other disorder of circulatory system: Secondary | ICD-10-CM | POA: Diagnosis not present

## 2021-10-07 MED ORDER — SILVER SULFADIAZINE 1 % EX CREA: TOPICAL_CREAM | CUTANEOUS | 0 refills | Status: AC

## 2021-10-07 NOTE — Progress Notes (Signed)
°  Subjective:  Patient ID: Debra Mcconnell, female    DOB: September 05, 1946,  MRN: [id number]  Chief Complaint  Patient presents with   Foot Ulcer    The left big toe I am not sure how it is doing    Routine Post Op    The right 3rd toe feels ok and not painful and does not hurt with shoes   75 y.o. female presents for wound care. Hx confirmed with patient. Unaware she had a spot on the bottom of the left great toe - denies treatment. Has been wearing her new shoes and thinks it's helping the right foot. Objective:  Physical Exam: Wound Location: left hallux Wound Measurement: 1*1 Wound Base: Granular/Healthy Peri-wound: Reddened, Calloused Exudate: Scant/small amount Serosanguinous exudate wound without warmth, erythema, signs of acute infection Faint DP pulse noted. Brisk bleeding upon gentle debridement of the ulceration  No open ulcer right foots. No HPKs. Evidence of prior skin irritation dorsal 2nd/4th PIPJs Amputation site well healed right 3rd toe  Radiographs:  X-ray of the left foot: no soft tissue emphysema, no signs of osteomyelitis and vascular calcifications noted Assessment:   1. Diabetic ulcer of toe of left foot associated with type 2 diabetes mellitus, with fat layer exposed ([place])   2. PAD (peripheral artery disease) (HCC)   3. Vascular calcification    Plan:  Patient was evaluated and treated and all questions answered.  Ulcer left hallux -XR reviewed with patient -Wound cleansed and debrided -Dressing applied consisting of silvadene and band-aid -Offload ulcer with surgical shoe -Surgical shoe dispensed  Procedure: Excisional Debridement of Wound Indication: Removal of non-viable soft tissue from the wound to promote healing.  Anesthesia: none Pre-Debridement Wound Measurements: overlying HPK  Post-Debridement Wound Measurements: 1 cm x 1 cm x 0.2 cm  Type of Debridement: Sharp Excisional Tissue Removed: Non-viable soft tissue Instrumentation: 15 blade and  tissue nipper Depth of Debridement: subcutaneous tissue. Technique: Sharp excisional debridement to bleeding, viable wound base.  Dressing: Dry, sterile, compression dressing. Disposition: Patient tolerated procedure well.   PAD, vascular calcifications -Repeat vascular studies  Hammertoes with ulceration -All healed, but remain high risk. Advised patient to be vigilant about any skin breakdown.  Return in about 3 weeks (around 10/28/2021) for Wound Care, vascular study f/u.

## 2021-10-08 DIAGNOSIS — Z6835 Body mass index (BMI) 35.0-35.9, adult: Secondary | ICD-10-CM | POA: Diagnosis not present

## 2021-10-08 DIAGNOSIS — I10 Essential (primary) hypertension: Secondary | ICD-10-CM | POA: Diagnosis not present

## 2021-10-08 DIAGNOSIS — I5022 Chronic systolic (congestive) heart failure: Secondary | ICD-10-CM | POA: Diagnosis not present

## 2021-10-08 DIAGNOSIS — E669 Obesity, unspecified: Secondary | ICD-10-CM | POA: Diagnosis not present

## 2021-10-08 DIAGNOSIS — N1832 Chronic kidney disease, stage 3b: Secondary | ICD-10-CM | POA: Diagnosis not present

## 2021-10-08 DIAGNOSIS — N2581 Secondary hyperparathyroidism of renal origin: Secondary | ICD-10-CM | POA: Diagnosis not present

## 2021-10-11 DIAGNOSIS — M19012 Primary osteoarthritis, left shoulder: Secondary | ICD-10-CM | POA: Diagnosis not present

## 2021-10-11 DIAGNOSIS — M25512 Pain in left shoulder: Secondary | ICD-10-CM | POA: Diagnosis not present

## 2021-10-14 ENCOUNTER — Ambulatory Visit (INDEPENDENT_AMBULATORY_CARE_PROVIDER_SITE_OTHER): Payer: Medicare Other

## 2021-10-14 ENCOUNTER — Other Ambulatory Visit: Payer: Self-pay

## 2021-10-14 DIAGNOSIS — I739 Peripheral vascular disease, unspecified: Secondary | ICD-10-CM

## 2021-11-04 ENCOUNTER — Ambulatory Visit: Payer: Medicare Other | Admitting: Podiatry

## 2021-11-04 DIAGNOSIS — I5022 Chronic systolic (congestive) heart failure: Secondary | ICD-10-CM | POA: Insufficient documentation

## 2021-11-05 DIAGNOSIS — E7800 Pure hypercholesterolemia, unspecified: Secondary | ICD-10-CM | POA: Diagnosis not present

## 2021-11-05 DIAGNOSIS — I129 Hypertensive chronic kidney disease with stage 1 through stage 4 chronic kidney disease, or unspecified chronic kidney disease: Secondary | ICD-10-CM | POA: Diagnosis not present

## 2021-11-05 DIAGNOSIS — I10 Essential (primary) hypertension: Secondary | ICD-10-CM | POA: Diagnosis not present

## 2021-11-05 NOTE — Progress Notes (Signed)
Cardiology Office Note:    Date:  11/06/2021   ID:  Debra Mcconnell, DOB 1946/07/20, MRN [id number]  PCP:  Greig Right, MD  Cardiologist:  Shirlee More, MD    Referring MD: Greig Right, MD    ASSESSMENT:    1. Atypical atrial flutter ([place])   2. Chronic anticoagulation   3. LBBB (left bundle branch block)   4. Hypertensive heart disease with heart failure ([place])   5. Hyperlipidemia, unspecified hyperlipidemia type    PLAN:    In order of problems listed above:  Overall Cher is doing well she has rate controlled atrial flutter/fibrillation looks more like fibrillation today tolerates her beta-blocker continue her anticoagulant not applied 3 days ZIO monitor to assess heart rhythm and rate control especially prior to elective orthopedic surgery Continue anticoagulant recommendations below regarding management perioperatively Stable EKG pattern Good response to and continue her current guideline directed treatment with improvement normalization of ejection fraction Continue her statin with stroke managed by her PCP   Next appointment: 6 months   Medication Adjustments/Labs and Tests Ordered: Current medicines are reviewed at length with the patient today.  Concerns regarding medicines are outlined above.  No orders of the defined types were placed in this encounter.  No orders of the defined types were placed in this encounter.   Chief Complaint  Patient presents with   Follow-up  Recently has been concerned by my heart rate in the range of 50 to 60 bpm Also I will be having orthopedic "shoulder replacement  History of Present Illness:    Debra Mcconnell is a 76 y.o. female with a hx of chronic atrial flutter previous stroke hypertensive heart disease left bundle branch block and cardiomyopathy CKD with a GFR of 31 cc creatinine 1.71 and chronic anticoagulation last seen 05/06/2021.  Her most recent ejection fraction 02/19/2021 showed a marked improvement in EF low  normal 50 to 55% from previous 30 to 35% with severe left atrial enlargement moderate mitral regurgitation with Pisa 0.65 and normal RV function size moderately elevated pulmonary artery systolic pressure.  Compliance with diet, lifestyle and medications: Yes  She is here with her health care navigator she has regular labs done at PCP office that I cannot see through care everywhere She has had no bleeding with her anticoagulant She anticipates left shoulder replacement and with her CKD I told her I like her to be off her anticoagulant Eliquis 3 days/6 doses prior to surgery and usually initiated 2 days/4 doses afterwards she would not require bridging. She tolerates her statin without muscle pain or weakness She has had no lightheadedness syncope edema shortness of breath or chest pain. In general she is improved and feels good except for chronic left shoulder pain She is on a good medical regimen for cardiomyopathy including carvedilol SGLT2 inhibitor Entresto and is not on spironolactone with her CKD Past Medical History:  Diagnosis Date   Acquired hammer toes of both feet 01/22/2017   Arthritis 11/29/2013   Atrial flutter ([place]) 01/24/2014   Overview:  CHADS2 vasc score= 5   B-complex deficiency 11/29/2013   Cerebral artery occlusion with cerebral infarction ([place]) 12/04/2013   Overview:  STORY: MRI +ve Left frontoparietal infarction (LMCA). +AFib.`E1o3L`IMPRESSION: BP is under controlled. Continue home health. +AFib, con't Eliquis 5mg  bid. Will obtain old record from [facility] for review.   Chronic systolic (congestive) heart failure (HCC)    Diabetic polyneuropathy associated with type 2 diabetes mellitus ([place]) 01/22/2017   Essential hypertension  11/13/2015   Hyperlipemia, retention 11/13/2015   OSA (obstructive sleep apnea) 01/24/2014   Overview:  IMPRESSION: Will obtain old record for review and ask DME to d/l compliance and fax the result to me. F/u in 6 wks.   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   atorvastatin (LIPITOR) 40 MG tablet Take 40 mg by mouth daily.   buPROPion (WELLBUTRIN XL) 300 MG 24 hr tablet Take 300 mg by mouth daily.   carvedilol (COREG) 25 MG tablet Take 25 mg by mouth 2 (two) times daily with a meal.   Cholecalciferol (VITAMIN D) 2000 units CAPS Take 4,000 Units by mouth 2 (two) times daily.   Cyanocobalamin (VITAMIN B 12 PO) Take 1,000 mcg by mouth daily.   dapagliflozin propanediol (FARXIGA) 10 MG TABS tablet Take 1 tablet (10 mg total) by mouth daily before breakfast.   ELIQUIS 5 MG TABS tablet TAKE 1 TABLET TWICE A DAY   Exenatide ER 2 MG SRER Inject 2 mg into the skin once a week.    ferrous sulfate 325 (65 FE) MG EC tablet Take 325 mg by mouth 2 (two) times daily with a meal.   furosemide (LASIX) 20 MG tablet Take 20 mg by mouth. Take 2 tablets (40 mg) in am and 1 tablet (20 mg) at noon   gabapentin (NEURONTIN) 300 MG capsule 300 mg. Take 1 capsule am and noon, and 4 capsule (1200mg ) at bedtime   hydrALAZINE (APRESOLINE) 25 MG tablet Take 25 mg by mouth 3 (three) times daily.   ketoconazole (NIZORAL) 2 % cream Apply topically at bedtime.   Lancets (ONETOUCH DELICA PLUS [id number]) MISC Apply 1 each topically daily.   levothyroxine (SYNTHROID, LEVOTHROID) 175 MCG tablet Take 175 mcg by mouth daily.   nystatin ointment (MYCOSTATIN) APPLY TOPICALLY TO THE AFFECTED AREA TWICE DAILY   polycarbophil (FIBERCON) 625 MG tablet Take 625 mg by mouth daily.   potassium chloride (KLOR-CON)  10 MEQ tablet Take 15 mEq by mouth daily.   sacubitril-valsartan (ENTRESTO) 49-51 MG Take 1 tablet by mouth 2 (two) times daily.   silver sulfADIAZINE (SILVADENE) 1 % cream Apply pea-sized amount to wound daily.   zolpidem (AMBIEN) 5 MG tablet Take 5 mg by mouth at bedtime as needed for sleep.     Allergies:   Gabapentin and Lisinopril   Social History   Socioeconomic History   Marital status: Divorced    Spouse name: Not on file   Number of children: Not on file   Years of education: Not on file   Highest education level: Not on file  Occupational History   Not on file  Tobacco Use   Smoking status: Former   Smokeless tobacco: Never  Vaping Use   Vaping Use: Never used  Substance and Sexual Activity   Alcohol use: No   Drug use: No   Sexual activity: Not on file  Other Topics Concern   Not on file  Social History  Narrative   Not on file   Social Determinants of Health   Financial Resource Strain: Not on file  Food Insecurity: Not on file  Transportation Needs: Not on file  Physical Activity: Not on file  Stress: Not on file  Social Connections: Not on file     Family History: The patient's family history includes Cancer in her father; Heart disease in her father and mother. ROS:   Please see the history of present illness.    All other systems reviewed and are negative.  EKGs/Labs/Other Studies Reviewed:    The following studies were reviewed today:  EKG:  EKG ordered today and personally reviewed.  The ekg ordered today demonstrates atrial fibrillation regular response 60 bpm left bundle branch block  Recent Labs: 01/25/2021: BUN 66; Creatinine, Ser 1.71; NT-Pro BNP 6,047; Potassium 5.0; Sodium 142  Recent Lipid Panel    Component Value Date/Time   CHOL 108 01/25/2021 1628   TRIG 43 01/25/2021 1628   HDL 50 01/25/2021 1628   CHOLHDL 2.2 01/25/2021 1628   LDLCALC 47 01/25/2021 1628    Physical Exam:    VS:  BP 100/70 (BP Location: Left Arm, Patient  Position: Sitting, Cuff Size: Normal)    Pulse 61    Ht 5\' 6"  (1.676 m)    Wt 205 lb (93 kg)    SpO2 96%    BMI 33.09 kg/m     Wt Readings from Last 3 Encounters:  11/06/21 205 lb (93 kg)  05/06/21 207 lb 9.6 oz (94.2 kg)  01/25/21 217 lb (98.4 kg)     GEN:  Well nourished, well developed in no acute distress HEENT: Normal NECK: No JVD; No carotid bruits LYMPHATICS: No lymphadenopathy CARDIAC: RRR, no murmurs, rubs, gallops RESPIRATORY:  Clear to auscultation without rales, wheezing or rhonchi  ABDOMEN: Soft, non-tender, non-distended MUSCULOSKELETAL:  No edema; No deformity  SKIN: Warm and dry NEUROLOGIC:  Alert and oriented x 3 PSYCHIATRIC:  Normal affect    Signed, Shirlee More, MD  11/06/2021 11:57 AM    [place]

## 2021-11-06 ENCOUNTER — Encounter: Payer: Self-pay | Admitting: Cardiology

## 2021-11-06 ENCOUNTER — Other Ambulatory Visit: Payer: Self-pay

## 2021-11-06 ENCOUNTER — Ambulatory Visit (INDEPENDENT_AMBULATORY_CARE_PROVIDER_SITE_OTHER): Payer: Medicare Other | Admitting: Cardiology

## 2021-11-06 ENCOUNTER — Ambulatory Visit (INDEPENDENT_AMBULATORY_CARE_PROVIDER_SITE_OTHER): Payer: Medicare Other

## 2021-11-06 VITALS — BP 100/70 | HR 61 | Ht 66.0 in | Wt 205.0 lb

## 2021-11-06 DIAGNOSIS — I484 Atypical atrial flutter: Secondary | ICD-10-CM

## 2021-11-06 DIAGNOSIS — E785 Hyperlipidemia, unspecified: Secondary | ICD-10-CM | POA: Diagnosis not present

## 2021-11-06 DIAGNOSIS — I447 Left bundle-branch block, unspecified: Secondary | ICD-10-CM

## 2021-11-06 DIAGNOSIS — Z7901 Long term (current) use of anticoagulants: Secondary | ICD-10-CM

## 2021-11-06 DIAGNOSIS — I110 Hypertensive heart disease with heart failure: Secondary | ICD-10-CM

## 2021-11-06 NOTE — Patient Instructions (Signed)
Medication Instructions:  ? ?Your physician recommends that you continue on your current medications as directed. Please refer to the Current Medication list given to you today. ? ?*If you need a refill on your cardiac medications before your next appointment, please call your pharmacy* ? ? ?Lab Work: [place] ? ? ? ?If you have labs (blood work) drawn today and your tests are completely normal, you will receive your results only by: ?MyChart Message (if you have MyChart) OR ?A paper copy in the mail ?If you have any lab test that is abnormal or we need to change your treatment, we will call you to review the results. ? ? ?Testing/Procedures: Your physician has recommended that you wear an event monitor. Event monitors are medical devices that record the heart?s electrical activity. Doctors most often [place] these monitors to diagnose arrhythmias. Arrhythmias are problems with the speed or rhythm of the heartbeat. The monitor is a small, portable device. You can wear one while you do your normal daily activities. This is usually used to diagnose what is causing palpitations/syncope (passing out). ? ? ? ?Follow-Up: ?At [facility], you and your health needs are our priority.  As part of our continuing mission to provide you with exceptional heart care, we have created designated Provider Care Teams.  These Care Teams include your primary Cardiologist (physician) and Advanced Practice Providers (APPs -  Physician Assistants and Nurse Practitioners) who all work together to provide you with the care you need, when you need it. ? ?We recommend signing up for the patient portal called "MyChart".  Sign up information is provided on this After Visit Summary.  MyChart is used to connect with patients for Virtual Visits (Telemedicine).  Patients are able to view lab/test results, encounter notes, upcoming appointments, etc.  Non-urgent messages can be sent to your provider as well.   ?To learn more about what you  can do with MyChart, go to [email or web address].   ? ?Your next appointment:   ?6 month(s) ? ?The format for your next appointment:   ?In Person ? ?Provider:   ?Shirlee More, MD{ ? ?  ?Other Instructions ? ?

## 2021-11-15 DIAGNOSIS — I484 Atypical atrial flutter: Secondary | ICD-10-CM | POA: Diagnosis not present

## 2021-11-18 ENCOUNTER — Ambulatory Visit (INDEPENDENT_AMBULATORY_CARE_PROVIDER_SITE_OTHER): Payer: Medicare Other

## 2021-11-18 ENCOUNTER — Encounter: Payer: Self-pay | Admitting: Podiatry

## 2021-11-18 ENCOUNTER — Ambulatory Visit (INDEPENDENT_AMBULATORY_CARE_PROVIDER_SITE_OTHER): Payer: Medicare Other | Admitting: Podiatry

## 2021-11-18 ENCOUNTER — Other Ambulatory Visit: Payer: Self-pay

## 2021-11-18 VITALS — Temp 97.4°F

## 2021-11-18 DIAGNOSIS — E11621 Type 2 diabetes mellitus with foot ulcer: Secondary | ICD-10-CM

## 2021-11-18 DIAGNOSIS — L97522 Non-pressure chronic ulcer of other part of left foot with fat layer exposed: Secondary | ICD-10-CM | POA: Diagnosis not present

## 2021-11-18 MED ORDER — DOXYCYCLINE HYCLATE 100 MG PO TABS: 100.0000 mg | ORAL_TABLET | Freq: Two times a day (BID) | ORAL | 0 refills | Status: AC

## 2021-11-18 NOTE — Progress Notes (Signed)
?  Subjective:  ?Patient ID: Debra Mcconnell, female    DOB: [birth year],   MRN: [id number] ? ?Chief Complaint  ?Patient presents with  ? Foot Ulcer  ?  I have a spot on the back of the right big toe and I am not sure if it is draining and my toes are long   ? ? ?76 y.o. female presents for follow-up of left foot wound. Relates she has not known what to do about her wound since Dr. March Rummage left. She has been dressing with Silvadene. Relates pain to the area and more redness over the toe.  . Denies any other pedal complaints. Denies n/v/f/c.  ? ?Past Medical History:  ?Diagnosis Date  ? Acquired hammer toes of both feet 01/22/2017  ? Arthritis 11/29/2013  ? Atrial flutter ([place]) 01/24/2014  ? Overview:  CHADS2 vasc score= 5  ? B-complex deficiency 11/29/2013  ? Cerebral artery occlusion with cerebral infarction ([place]) 12/04/2013  ? Overview:  STORY: MRI +ve Left frontoparietal infarction (LMCA). +AFib.`E1o3L`IMPRESSION: BP is under controlled. Continue home health. +AFib, con't Eliquis '5mg'$  bid. Will obtain old record from [facility] for review.  ? Chronic systolic (congestive) heart failure (HCC)   ? Diabetic polyneuropathy associated with type 2 diabetes mellitus ([place]) 01/22/2017  ? Essential hypertension 11/13/2015  ? Hyperlipemia, retention 11/13/2015  ? OSA (obstructive sleep apnea) 01/24/2014  ? Overview:  IMPRESSION: Will obtain old record for review and ask DME to d/l compliance and fax the result to me. F/u in 6 wks.  ? Pre-ulcerative corn or callous 01/22/2017  ? Stroke ([place]) 01/24/2014  ? Thyroid disease 11/29/2013  ? 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 ? ?Objective:  ?Physical Exam: ?Vascular: DP/PT pulses 2/4 bilateral. CFT <3 seconds. Normal hair growth on digits. No edema.  ?Skin. No lacerations or abrasions bilateral feet. Ulcer left great toe with granular base measuring 1 cm x 1 cm x 0.4 cm with surrounding hyperkeratosis and debris. Some erythema tracking proximally up toe.   ?Musculoskeletal: MMT 5/5 bilateral lower extremities in DF, PF, Inversion and Eversion. Deceased ROM in DF of ankle joint.  ?Neurological: Sensation intact to light touch.  ? ?Assessment:  ? ?1. Diabetic ulcer of toe of left foot associated with type 2 diabetes mellitus, with fat layer exposed ([place])   ? ? ? ?Plan:  ?Patient was evaluated and treated and all questions answered. ?Ulcer left hallux with fat layer exposed ?X-rays reviewed. No changes noted from previous x-rays no obvious erosions.  ?-Debridement as below. ?-Dressed with silvadene, DSD. ?-Off-loading with surgical shoe. ?-Doxycycline sent to pharmacy.  ?-Discussed glucose control and proper protein-rich diet.  ?-Discussed if any worsening redness, pain, fever or chills to call or may need to report to the emergency room. Patient expressed understanding.  ? ?Procedure: Excisional Debridement of Wound ?Rationale: Removal of non-viable soft tissue from the wound to promote healing.  ?Anesthesia: none ?Pre-Debridement Wound Measurements: overlying callus 0.2 cm x 0.2 cm  ?Post-Debridement Wound Measurements: 1  cm x 1 cm x 0.4 cm  ?Type of Debridement: Sharp Excisional ?Tissue Removed: Non-viable soft tissue ?Depth of Debridement: subcutaneous tissue. ?Technique: Sharp excisional debridement to bleeding, viable wound base.  ?Dressing: Dry, sterile, compression dressing. ?Disposition: Patient tolerated procedure well. Patient to return in 2 week for follow-up. ? ?No follow-ups on file. ? ? ?Lorenda Peck, DPM  ? ? ?

## 2021-11-21 DIAGNOSIS — M19012 Primary osteoarthritis, left shoulder: Secondary | ICD-10-CM

## 2021-11-21 HISTORY — DX: Primary osteoarthritis, left shoulder: M19.012

## 2021-11-22 DIAGNOSIS — M25812 Other specified joint disorders, left shoulder: Secondary | ICD-10-CM | POA: Diagnosis not present

## 2021-11-22 DIAGNOSIS — M25512 Pain in left shoulder: Secondary | ICD-10-CM | POA: Diagnosis not present

## 2021-11-22 DIAGNOSIS — M19012 Primary osteoarthritis, left shoulder: Secondary | ICD-10-CM | POA: Diagnosis not present

## 2021-11-25 ENCOUNTER — Telehealth: Payer: Self-pay

## 2021-11-25 NOTE — Telephone Encounter (Signed)
? ?  Pre-operative Risk Assessment  ?  ?Patient Name: Debra Mcconnell  ?DOB: 1946-05-22 ?MRN: [id number]  ? ?  ? ?Request for Surgical Clearance   ? ?Procedure:   Left Reverse Shoulder Arthroplasty ? ?Date of Surgery:  Clearance TBD                              ?   ?Surgeon:  Dr Justice Britain, MD ?Surgeon's Group or Practice Name:  [facility] ?Phone number:  [phone] ?Fax number:  [phone] ?  ?Type of Clearance Requested:  Medical and  ?- Pharmacy:  Hold Apixaban (Eliquis) Eliquis ?  ?Type of Anesthesia:  General  ?  ?Additional requests/questions:   Need instructions for Eliquis ? ?Signed, ?Toni Arthurs   ?11/25/2021, 12:41 PM   ?

## 2021-11-27 NOTE — Telephone Encounter (Signed)
? ?  Primary Cardiologist: Shirlee More, MD ? ?Chart reviewed as part of pre-operative protocol coverage. Given past medical history and time since last visit, based on ACC/AHA guidelines, Debra Mcconnell would be at acceptable risk for the planned procedure without further cardiovascular testing.  ? ?Patient with diagnosis of atrial fibrillation on Eliquis for anticoagulation.   ?  ?Procedure: Left shoulder reverse arthroplasty ?Date of procedure: TBD ?  ?  ?CHA2DS2-VASc Score = 7  ? This indicates a 11.2% annual risk of stroke. ?The patient's score is based upon: ?CHF History: 1 ?HTN History: 1 ?Diabetes History: 1 ?Stroke History: 2 ?Vascular Disease History: 0 ?Age Score: 1 ?Gender Score: 1 ?  ?CrCl 41 ?Platelet count 153 ?  ?Per office protocol, patient can hold Eliquis for 3 days prior to procedure.   ?Patient will not need bridging with Lovenox (enoxaparin) around procedure. ? ?For orthopedic procedures please be sure to resume therapeutic (not prophylactic) dosing. ?  ?Patient saw Dr. Bettina Gavia 3//23 - in his note he mentions to hold for surgery x 3 days w/o bridging.   ?  ? ?I will route this recommendation to the requesting party via Epic fax function and remove from pre-op pool. ? ?Please call with questions. ? ?Jossie Ng. Judith Campillo NP-C ? ?  ?11/27/2021, 8:05 AM ?[place] ?[address] ?Office [phone] Fax [phone] ? ? ? ? ?

## 2021-11-27 NOTE — Telephone Encounter (Signed)
Patient with diagnosis of atrial fibrillation on Eliquis for anticoagulation.   ? ?Procedure: Left shoulder reverse arthroplasty ?Date of procedure: TBD ? ? ?CHA2DS2-VASc Score = 7  ? This indicates a 11.2% annual risk of stroke. ?The patient's score is based upon: ?CHF History: 1 ?HTN History: 1 ?Diabetes History: 1 ?Stroke History: 2 ?Vascular Disease History: 0 ?Age Score: 1 ?Gender Score: 1 ?  ?CrCl 41 ?Platelet count 153 ? ?Per office protocol, patient can hold Eliquis for 3 days prior to procedure.   ?Patient will not need bridging with Lovenox (enoxaparin) around procedure. ? ?For orthopedic procedures please be sure to resume therapeutic (not prophylactic) dosing. ? ?Patient saw Dr. Bettina Gavia 3//23 - in his note he mentions to hold for surgery x 3 days w/o bridging.   ? ?

## 2021-12-03 ENCOUNTER — Other Ambulatory Visit: Payer: Self-pay

## 2021-12-03 ENCOUNTER — Ambulatory Visit (INDEPENDENT_AMBULATORY_CARE_PROVIDER_SITE_OTHER): Payer: Medicare Other | Admitting: Sports Medicine

## 2021-12-03 ENCOUNTER — Encounter: Payer: Self-pay | Admitting: Sports Medicine

## 2021-12-03 DIAGNOSIS — L97522 Non-pressure chronic ulcer of other part of left foot with fat layer exposed: Secondary | ICD-10-CM

## 2021-12-03 DIAGNOSIS — E11621 Type 2 diabetes mellitus with foot ulcer: Secondary | ICD-10-CM | POA: Diagnosis not present

## 2021-12-03 DIAGNOSIS — E1142 Type 2 diabetes mellitus with diabetic polyneuropathy: Secondary | ICD-10-CM

## 2021-12-03 DIAGNOSIS — I739 Peripheral vascular disease, unspecified: Secondary | ICD-10-CM

## 2021-12-03 NOTE — Progress Notes (Signed)
Subjective: ?Debra Mcconnell is a 76 y.o. female patient seen in office for evaluation of ulceration of the left great toe.  Patient is diabetic with blood sugars not recorded and patient is assisted by caregiver who helps take care of the wound and patient reports that she is not sure about how it is looking.  Patient denies nausea vomiting fever chills or any constitutional symptoms at this time. ? ?Patient Active Problem List  ? Diagnosis Date Noted  ? Osteoarthritis of left glenohumeral joint 11/21/2021  ? Chronic systolic (congestive) heart failure ([place]) 11/04/2021  ? Pneumonia due to COVID-19 virus 10/19/2019  ? Acute respiratory failure with hypoxia ([place])   ? Biventricular failure ([place])   ? Pressure injury of skin 10/05/2019  ? 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 Bilateral sensorineural hearing loss 05/17/2019  ? Sudden right hearing loss 05/17/2019  ? Comprehensive diabetic foot examination, type 2 DM, encounter for [facility]) 11/11/2018  ? Chronic anticoagulation 04/16/2017  ? Acquired hammer toes of both feet 01/22/2017  ? Diabetic polyneuropathy associated with type 2 diabetes mellitus ([place]) 01/22/2017  ? Pre-ulcerative corn or callous 01/22/2017  ? Essential hypertension 11/13/2015  ? Hyperlipemia, retention 11/13/2015  ? Atrial flutter ([place]) 01/24/2014  ? OSA (obstructive sleep apnea) 01/24/2014  ? Stroke ([place]) 01/24/2014  ? Cerebral artery occlusion with cerebral infarction ([place]) 12/04/2013  ? Arthritis 11/29/2013  ? B-complex deficiency 11/29/2013  ? Sleep apnea 11/29/2013  ? Thyroid disease 11/29/2013  ? 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 ?Current Outpatient Medications on File Prior to Visit  ?Medication Sig Dispense Refill  ? alendronate (FOSAMAX) 70 MG tablet Take 70 mg by mouth once a week.    ? atorvastatin (LIPITOR) 40 MG tablet Take 40 mg by mouth daily.    ? buPROPion (WELLBUTRIN XL) 300 MG 24 hr tablet Take 300 mg by mouth daily.    ? carvedilol  (COREG) 25 MG tablet Take 25 mg by mouth 2 (two) times daily with a meal.    ? Cholecalciferol (VITAMIN D) 2000 units CAPS Take 4,000 Units by mouth 2 (two) times daily.    ? Cyanocobalamin (VITAMIN B 12 PO) Take 1,000 mcg by mouth daily.    ? dapagliflozin propanediol (FARXIGA) 10 MG TABS tablet Take 1 tablet (10 mg total) by mouth daily before breakfast. 90 tablet 3  ? ELIQUIS 5 MG TABS tablet TAKE 1 TABLET TWICE A DAY 180 tablet 1  ? Exenatide ER 2 MG SRER Inject 2 mg into the skin once a week.     ? ferrous sulfate 325 (65 FE) MG EC tablet Take 325 mg by mouth 2 (two) times daily with a meal.    ? furosemide (LASIX) 20 MG tablet Take 20 mg by mouth. Take 2 tablets (40 mg) in am and 1 tablet (20 mg) at noon    ? gabapentin (NEURONTIN) 300 MG capsule 300 mg. Take 1 capsule am and noon, and 4 capsule ('1200mg'$ ) at bedtime    ? hydrALAZINE (APRESOLINE) 25 MG tablet Take 25 mg by mouth 3 (three) times daily.    ? ketoconazole (NIZORAL) 2 % cream Apply topically at bedtime.    ? Lancets (ONETOUCH DELICA PLUS [id number]) MISC Apply 1 each topically daily.    ? levothyroxine (SYNTHROID, LEVOTHROID) 175 MCG tablet Take 175 mcg by mouth daily.    ? nystatin ointment (MYCOSTATIN) APPLY TOPICALLY TO THE AFFECTED AREA TWICE DAILY    ? polycarbophil (FIBERCON) 625  MG tablet Take 625 mg by mouth daily.    ? potassium chloride (KLOR-CON) 10 MEQ tablet Take 15 mEq by mouth daily.    ? sacubitril-valsartan (ENTRESTO) 49-51 MG Take 1 tablet by mouth 2 (two) times daily.    ? silver sulfADIAZINE (SILVADENE) 1 % cream Apply pea-sized amount to wound daily. 50 g 0  ? zolpidem (AMBIEN) 5 MG tablet Take 5 mg by mouth at bedtime as needed for sleep.    ? ?No current facility-administered medications on file prior to visit.  ? ?Allergies  ?Allergen Reactions  ? Gabapentin Other (See Comments)  ?  Dizziness and drowsiness  ? Lisinopril Cough  ? ? ?No results found for this or any previous visit (from the past 2160  hour(s)). ? ?Objective: ?There were no vitals filed for this visit. ? ?General: Patient is awake, alert, oriented x 3 and in no acute distress. ? ?Dermatology: Skin is warm and dry bilateral with a full thickness ulceration present plantar distal tuft of the left hallux. Ulceration measures 0.2 cm x 0.3 cm x 0.2 cm. There is a keratotic border with a granular base. The ulceration does not probe to bone. There is no malodor, no active drainage, minimal erythema, no edema.  There is a superficial blood blister noted adjacent to the wound at the distal tuft of the left hallux once debrided there was a small amount of bloody drainage no communication to the original plantar hallux ulcer. ?  ?Vascular: Dorsalis Pedis pulse = 1/4 Bilateral,  Posterior Tibial pulse = 1/4 Bilateral,  Capillary Fill Time < 5 seconds ? ?Neurologic: Gross sensation present via light touch to the left foot. ? ?Musculosketal: There is no pain with palpation to the ulcerated area at the left hallux. ? ?No results for input(s): GRAMSTAIN, LABORGA in the last 8760 hours. ? ?Assessment and Plan:  ?Problem List Items Addressed This Visit   ? ?  ? Endocrine  ? Diabetic polyneuropathy associated with type 2 diabetes mellitus ([place])  ? ?Other Visit Diagnoses   ? ? Diabetic ulcer of toe of left foot associated with type 2 diabetes mellitus, with fat layer exposed ([place])    -  Primary  ? PAD (peripheral artery disease) ([place])      ? ?  ? ? ? ?-Examined patient and discussed the progression of the wound and treatment alternatives. ?- Excisionally dedbrided ulceration at left hallux to healthy bleeding borders removing nonviable tissue using a sterile chisel blade. Wound measures post debridement as above.  Wound was debrided to the level of the dermis with viable wound base exposed to promote healing. Hemostasis was achieved with manuel pressure. Patient tolerated procedure well without any discomfort or anesthesia necessary for this wound debridement.   ?-Applied Silvadene and Band-Aid dressing and instructed patient to continue with daily dressings at home consisting of the same with assistance from aide who was present at today's visit and I instructed her on how to do the dressing each day ?-Patient has completed doxycycline no additional antibiotics needed at this time ?-Advised patient to continue with surgical shoe however her current shoe noted to be slightly small so we exchanged her shoe out for a larger size to prevent the toe from rubbing ?- Advised patient to go to the ER or return to office if the wound worsens or if constitutional symptoms are present. ?-Patient to return to office in 2 weeks for follow up wound care and evaluation or sooner if problems arise. ? ?Landis Martins,  DPM ?

## 2021-12-12 DIAGNOSIS — Z7984 Long term (current) use of oral hypoglycemic drugs: Secondary | ICD-10-CM | POA: Diagnosis not present

## 2021-12-12 DIAGNOSIS — Z961 Presence of intraocular lens: Secondary | ICD-10-CM | POA: Diagnosis not present

## 2021-12-12 DIAGNOSIS — E113493 Type 2 diabetes mellitus with severe nonproliferative diabetic retinopathy without macular edema, bilateral: Secondary | ICD-10-CM | POA: Diagnosis not present

## 2021-12-12 DIAGNOSIS — H47213 Primary optic atrophy, bilateral: Secondary | ICD-10-CM | POA: Diagnosis not present

## 2021-12-12 DIAGNOSIS — H524 Presbyopia: Secondary | ICD-10-CM | POA: Diagnosis not present

## 2021-12-12 DIAGNOSIS — H26491 Other secondary cataract, right eye: Secondary | ICD-10-CM | POA: Diagnosis not present

## 2021-12-12 DIAGNOSIS — H01009 Unspecified blepharitis unspecified eye, unspecified eyelid: Secondary | ICD-10-CM | POA: Diagnosis not present

## 2021-12-16 ENCOUNTER — Ambulatory Visit (INDEPENDENT_AMBULATORY_CARE_PROVIDER_SITE_OTHER): Payer: Self-pay | Admitting: Podiatry

## 2021-12-16 DIAGNOSIS — Z91199 Patient's noncompliance with other medical treatment and regimen due to unspecified reason: Secondary | ICD-10-CM

## 2021-12-16 NOTE — Progress Notes (Signed)
No show

## 2021-12-23 ENCOUNTER — Telehealth: Payer: Self-pay | Admitting: Sports Medicine

## 2021-12-23 NOTE — Telephone Encounter (Signed)
Pt's wound is worse and causing pt pain. She would like to get in, however your next available appt is after what she already has scheduled.  ? ?Your schedule is at it's max this week.  ? ?Please advise.  ?

## 2021-12-24 NOTE — Telephone Encounter (Signed)
Actually since my schedule is maxed out today and she missed her appt on 4/10 she can be rescheduled with Dr. Blenda Mounts on Monday 4/24. If her pain or wound continues to worsen then go to Urgent care or ER.  ?Thanks ?Dr. Cannon Kettle ?

## 2021-12-26 ENCOUNTER — Encounter: Payer: Self-pay | Admitting: Podiatry

## 2021-12-26 ENCOUNTER — Ambulatory Visit (INDEPENDENT_AMBULATORY_CARE_PROVIDER_SITE_OTHER): Payer: Medicare Other | Admitting: Podiatry

## 2021-12-26 DIAGNOSIS — L97522 Non-pressure chronic ulcer of other part of left foot with fat layer exposed: Secondary | ICD-10-CM | POA: Diagnosis not present

## 2021-12-26 DIAGNOSIS — L03031 Cellulitis of right toe: Secondary | ICD-10-CM

## 2021-12-26 DIAGNOSIS — E11621 Type 2 diabetes mellitus with foot ulcer: Secondary | ICD-10-CM

## 2021-12-26 DIAGNOSIS — L02611 Cutaneous abscess of right foot: Secondary | ICD-10-CM | POA: Diagnosis not present

## 2021-12-27 NOTE — Progress Notes (Signed)
Subjective:  ? ?Patient ID: Debra Mcconnell, female   DOB: 76 y.o.   MRN: [id number]  ? ?HPI ?Patient presents stating doing much better and presents with caregiver today stating she is wearing her shoe at this time ? ? ?ROS ? ? ?   ?Objective:  ?Physical Exam  ?Neurovascular status is unchanged from previous visits with small ulceration distal aspect left hallux which is healing with no subcutaneous exposure noted measuring approximately 5 x 5 mm with mild erythema in the toe but localized no proximal edema erythema or drainage noted ? ?   ?Assessment:  ?Improving from ulceration of the left hallux that is almost completely healed with just a superficial irritation ? ?   ?Plan:  ?Reviewed condition continue with local wound care continue open toed shoe and this should heal uneventfully over the next few weeks.  If increased redness drainage or increased size of this were to occur will be seen back immediately and explained we cannot rule out bone infection and the possibility for amputation in the future still does exist ?   ? ? ?

## 2021-12-30 DIAGNOSIS — H26491 Other secondary cataract, right eye: Secondary | ICD-10-CM | POA: Diagnosis not present

## 2021-12-30 DIAGNOSIS — H353131 Nonexudative age-related macular degeneration, bilateral, early dry stage: Secondary | ICD-10-CM | POA: Diagnosis not present

## 2021-12-30 DIAGNOSIS — Z961 Presence of intraocular lens: Secondary | ICD-10-CM | POA: Diagnosis not present

## 2022-01-01 ENCOUNTER — Ambulatory Visit (INDEPENDENT_AMBULATORY_CARE_PROVIDER_SITE_OTHER): Payer: Medicare Other | Admitting: Sports Medicine

## 2022-01-01 ENCOUNTER — Encounter: Payer: Self-pay | Admitting: Sports Medicine

## 2022-01-01 DIAGNOSIS — L97521 Non-pressure chronic ulcer of other part of left foot limited to breakdown of skin: Secondary | ICD-10-CM | POA: Diagnosis not present

## 2022-01-01 DIAGNOSIS — I739 Peripheral vascular disease, unspecified: Secondary | ICD-10-CM | POA: Diagnosis not present

## 2022-01-01 DIAGNOSIS — L03031 Cellulitis of right toe: Secondary | ICD-10-CM | POA: Diagnosis not present

## 2022-01-01 DIAGNOSIS — E11621 Type 2 diabetes mellitus with foot ulcer: Secondary | ICD-10-CM

## 2022-01-01 DIAGNOSIS — E1142 Type 2 diabetes mellitus with diabetic polyneuropathy: Secondary | ICD-10-CM

## 2022-01-01 DIAGNOSIS — L02611 Cutaneous abscess of right foot: Secondary | ICD-10-CM

## 2022-01-01 NOTE — Progress Notes (Signed)
Subjective: ?Debra Mcconnell is a 75 y.o. female patient seen in office for follow-up evaluation of ulceration of the left great toe.  Patient is diabetic with blood sugars not recorded and patient is assisted by caregiver who helps take care of the wound and patient reports that she is doing better.  Patient denies nausea vomiting fever chills or any constitutional symptoms at this time. ? ?Patient Active Problem List  ? Diagnosis Date Noted  ? Osteoarthritis of left glenohumeral joint 11/21/2021  ? Chronic systolic (congestive) heart failure ([place]) 11/04/2021  ? Pneumonia due to COVID-19 virus 10/19/2019  ? Acute respiratory failure with hypoxia ([place])   ? Biventricular failure ([place])   ? Pressure injury of skin 10/05/2019  ? 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 Bilateral sensorineural hearing loss 05/17/2019  ? Sudden right hearing loss 05/17/2019  ? Comprehensive diabetic foot examination, type 2 DM, encounter for [facility]) 11/11/2018  ? Chronic anticoagulation 04/16/2017  ? Acquired hammer toes of both feet 01/22/2017  ? Diabetic polyneuropathy associated with type 2 diabetes mellitus ([place]) 01/22/2017  ? Pre-ulcerative corn or callous 01/22/2017  ? Essential hypertension 11/13/2015  ? Hyperlipemia, retention 11/13/2015  ? Atrial flutter ([place]) 01/24/2014  ? OSA (obstructive sleep apnea) 01/24/2014  ? Stroke ([place]) 01/24/2014  ? Cerebral artery occlusion with cerebral infarction ([place]) 12/04/2013  ? Arthritis 11/29/2013  ? B-complex deficiency 11/29/2013  ? Sleep apnea 11/29/2013  ? Thyroid disease 11/29/2013  ? 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 ?Current Outpatient Medications on File Prior to Visit  ?Medication Sig Dispense Refill  ? alendronate (FOSAMAX) 70 MG tablet Take 70 mg by mouth once a week.    ? atorvastatin (LIPITOR) 40 MG tablet Take 40 mg by mouth daily.    ? buPROPion (WELLBUTRIN XL) 300 MG 24 hr tablet Take 300 mg by mouth daily.    ? carvedilol (COREG)  25 MG tablet Take 25 mg by mouth 2 (two) times daily with a meal.    ? Cholecalciferol (VITAMIN D) 2000 units CAPS Take 4,000 Units by mouth 2 (two) times daily.    ? Cyanocobalamin (VITAMIN B 12 PO) Take 1,000 mcg by mouth daily.    ? dapagliflozin propanediol (FARXIGA) 10 MG TABS tablet Take 1 tablet (10 mg total) by mouth daily before breakfast. 90 tablet 3  ? doxycycline (VIBRA-TABS) 100 MG tablet     ? ELIQUIS 5 MG TABS tablet TAKE 1 TABLET TWICE A DAY 180 tablet 1  ? Exenatide ER 2 MG SRER Inject 2 mg into the skin once a week.     ? ferrous sulfate 325 (65 FE) MG EC tablet Take 325 mg by mouth 2 (two) times daily with a meal.    ? furosemide (LASIX) 20 MG tablet Take 20 mg by mouth. Take 2 tablets (40 mg) in am and 1 tablet (20 mg) at noon    ? gabapentin (NEURONTIN) 300 MG capsule 300 mg. Take 1 capsule am and noon, and 4 capsule ('1200mg'$ ) at bedtime    ? hydrALAZINE (APRESOLINE) 25 MG tablet Take 25 mg by mouth 3 (three) times daily.    ? ketoconazole (NIZORAL) 2 % cream Apply topically at bedtime.    ? Lancets (ONETOUCH DELICA PLUS [id number]) MISC Apply 1 each topically daily.    ? levothyroxine (SYNTHROID, LEVOTHROID) 175 MCG tablet Take 175 mcg by mouth daily.    ? nystatin ointment (MYCOSTATIN) APPLY TOPICALLY TO THE AFFECTED AREA TWICE DAILY    ?  polycarbophil (FIBERCON) 625 MG tablet Take 625 mg by mouth daily.    ? potassium chloride (KLOR-CON) 10 MEQ tablet Take 15 mEq by mouth daily.    ? sacubitril-valsartan (ENTRESTO) 49-51 MG Take 1 tablet by mouth 2 (two) times daily.    ? silver sulfADIAZINE (SILVADENE) 1 % cream Apply pea-sized amount to wound daily. 50 g 0  ? zolpidem (AMBIEN) 5 MG tablet Take 5 mg by mouth at bedtime as needed for sleep.    ? ?No current facility-administered medications on file prior to visit.  ? ?Allergies  ?Allergen Reactions  ? Gabapentin Other (See Comments)  ?  Dizziness and drowsiness  ? Lisinopril Cough  ? ? ?No results found for this or any previous visit (from the  past 2160 hour(s)). ? ?Objective: ?There were no vitals filed for this visit. ? ?General: Patient is awake, alert, oriented x 3 and in no acute distress. ? ?Dermatology: Skin is warm and dry bilateral with a now partial thickness ulceration present plantar distal tuft of the left hallux. Ulceration measures 0.1 cm x 0.1 cm x 0.1 cm. There is a minimal keratotic border with a granular base. The ulceration does not probe to bone. There is no malodor, no active drainage, minimal erythema, no edema.   ?  ?Vascular: Dorsalis Pedis pulse = 1/4 Bilateral,  Posterior Tibial pulse = 1/4 Bilateral,  Capillary Fill Time < 5 seconds ? ?Neurologic: Gross sensation present via light touch to the left foot. ? ?Musculosketal: There is no pain with palpation to the ulcerated area at the left hallux. ? ?No results for input(s): GRAMSTAIN, LABORGA in the last 8760 hours. ? ?Assessment and Plan:  ?Problem List Items Addressed This Visit   ? ?  ? Endocrine  ? Diabetic polyneuropathy associated with type 2 diabetes mellitus ([place])  ? ?Other Visit Diagnoses   ? ? Diabetic ulcer of toe of left foot associated with type 2 diabetes mellitus, limited to breakdown of skin ([place])    -  Primary  ? Cellulitis and abscess of toe of right foot      ? PAD (peripheral artery disease) ([place])      ? ?  ? ? ?-Examined patient and discussed the progression of the wound and treatment alternatives. ?-Cleansed ulceration at left hallux to healthy bleeding borders removing nonviable tissue in a nonselective fashion using a saline moistened gauze. Wound measures post debridement as above.  Wound was debrided to the level of the dermis with viable wound base exposed to promote healing. Hemostasis was achieved with manuel pressure. Patient tolerated procedure well without any discomfort or anesthesia necessary for this nonselective wound debridement.  ?-Applied Silvadene and Band-Aid dressing and advised patient's caregiver to assist with continuing to do the same  thing until it is finally healed ?-Continue with surgical shoe to prevent rubbing of great toe ?- Advised patient to go to the ER or return to office if the wound worsens or if constitutional symptoms are present. ?-Patient to return to office in 2 weeks for follow up wound care and evaluation or sooner if problems arise. ? ?Landis Martins, DPM ?

## 2022-01-02 ENCOUNTER — Telehealth: Payer: Self-pay

## 2022-01-02 NOTE — Telephone Encounter (Signed)
Received notice of prior approval for Debra Mcconnell will expire on 02/20/2022 ? ?I have sent new prior authorization through Cover My Meds waiting determination ? ?Debra Mcconnell (Key: [id number]) ?

## 2022-01-03 NOTE — Telephone Encounter (Signed)
The Prior Authorization request has been approved for Farxiga '10MG'$  OR TABS. ?The authorization is valid from 12/04/2021 through 01/03/2023. A letter of explanation will also be ?mailed to the patient. ?Copy of authorization scanned to chart ?

## 2022-01-07 DIAGNOSIS — H26491 Other secondary cataract, right eye: Secondary | ICD-10-CM | POA: Diagnosis not present

## 2022-01-07 DIAGNOSIS — H01009 Unspecified blepharitis unspecified eye, unspecified eyelid: Secondary | ICD-10-CM | POA: Diagnosis not present

## 2022-01-07 DIAGNOSIS — Z961 Presence of intraocular lens: Secondary | ICD-10-CM | POA: Diagnosis not present

## 2022-01-07 DIAGNOSIS — H524 Presbyopia: Secondary | ICD-10-CM | POA: Diagnosis not present

## 2022-01-07 DIAGNOSIS — E113493 Type 2 diabetes mellitus with severe nonproliferative diabetic retinopathy without macular edema, bilateral: Secondary | ICD-10-CM | POA: Diagnosis not present

## 2022-01-07 DIAGNOSIS — Z7984 Long term (current) use of oral hypoglycemic drugs: Secondary | ICD-10-CM | POA: Diagnosis not present

## 2022-01-07 DIAGNOSIS — H47213 Primary optic atrophy, bilateral: Secondary | ICD-10-CM | POA: Diagnosis not present

## 2022-01-13 NOTE — Telephone Encounter (Signed)
Dr. Greig Right, Patient's PCP was calling to get a copy of the surgical clearance for their records. Please fax to [phone]  ?

## 2022-01-15 ENCOUNTER — Encounter: Payer: Self-pay | Admitting: Sports Medicine

## 2022-01-15 ENCOUNTER — Ambulatory Visit (INDEPENDENT_AMBULATORY_CARE_PROVIDER_SITE_OTHER): Payer: Medicare Other | Admitting: Sports Medicine

## 2022-01-15 DIAGNOSIS — L97521 Non-pressure chronic ulcer of other part of left foot limited to breakdown of skin: Secondary | ICD-10-CM

## 2022-01-15 DIAGNOSIS — M79674 Pain in right toe(s): Secondary | ICD-10-CM

## 2022-01-15 DIAGNOSIS — E1142 Type 2 diabetes mellitus with diabetic polyneuropathy: Secondary | ICD-10-CM

## 2022-01-15 DIAGNOSIS — B351 Tinea unguium: Secondary | ICD-10-CM

## 2022-01-15 DIAGNOSIS — E11621 Type 2 diabetes mellitus with foot ulcer: Secondary | ICD-10-CM | POA: Diagnosis not present

## 2022-01-15 DIAGNOSIS — M79675 Pain in left toe(s): Secondary | ICD-10-CM | POA: Diagnosis not present

## 2022-01-15 DIAGNOSIS — I739 Peripheral vascular disease, unspecified: Secondary | ICD-10-CM

## 2022-01-15 MED ORDER — IODOSORB 0.9 % EX GEL: 1.0000 "application " | Freq: Every day | CUTANEOUS | 0 refills | Status: DC | PRN

## 2022-01-15 NOTE — Progress Notes (Signed)
Subjective: ?Debra Mcconnell is a 76 y.o. female patient seen in office for follow-up evaluation of ulceration of the left great toe.  Patient is assisted by her home aide who reports that the skin was dry and healed up but now it has opened back up over the last few days.  Patient is also requesting nail trim.  Patient is diabetic with blood sugars not recorded.  Denies constitutional symptoms at this time. ? ?Patient Active Problem List  ? Diagnosis Date Noted  ? Osteoarthritis of left glenohumeral joint 11/21/2021  ? Chronic systolic (congestive) heart failure ([place]) 11/04/2021  ? Pneumonia due to COVID-19 virus 10/19/2019  ? Acute respiratory failure with hypoxia ([place])   ? Biventricular failure ([place])   ? Pressure injury of skin 10/05/2019  ? 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 Bilateral sensorineural hearing loss 05/17/2019  ? Sudden right hearing loss 05/17/2019  ? Comprehensive diabetic foot examination, type 2 DM, encounter for [facility]) 11/11/2018  ? Chronic anticoagulation 04/16/2017  ? Acquired hammer toes of both feet 01/22/2017  ? Diabetic polyneuropathy associated with type 2 diabetes mellitus ([place]) 01/22/2017  ? Pre-ulcerative corn or callous 01/22/2017  ? Essential hypertension 11/13/2015  ? Hyperlipemia, retention 11/13/2015  ? Atrial flutter ([place]) 01/24/2014  ? OSA (obstructive sleep apnea) 01/24/2014  ? Stroke ([place]) 01/24/2014  ? Cerebral artery occlusion with cerebral infarction ([place]) 12/04/2013  ? Arthritis 11/29/2013  ? B-complex deficiency 11/29/2013  ? Sleep apnea 11/29/2013  ? Thyroid disease 11/29/2013  ? 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 ?Current Outpatient Medications on File Prior to Visit  ?Medication Sig Dispense Refill  ? ACCU-CHEK GUIDE test strip USE TO CHECK GLUCOSE    ? alendronate (FOSAMAX) 70 MG tablet Take 70 mg by mouth once a week.    ? atorvastatin (LIPITOR) 40 MG tablet Take 40 mg by mouth daily.    ? BD PEN NEEDLE NANO 2ND GEN  32G X 4 MM MISC USE WITH PEN AS DIRECTED.    ? Blood Glucose Monitoring Suppl (ACCU-CHEK GUIDE) w/Device KIT USE TO CHECK BLOOD SUGAR DAILY    ? buPROPion (WELLBUTRIN XL) 300 MG 24 hr tablet Take 300 mg by mouth daily.    ? carvedilol (COREG) 25 MG tablet Take 25 mg by mouth 2 (two) times daily with a meal.    ? Cholecalciferol (VITAMIN D) 2000 units CAPS Take 4,000 Units by mouth 2 (two) times daily.    ? Cyanocobalamin (VITAMIN B 12 PO) Take 1,000 mcg by mouth daily.    ? dapagliflozin propanediol (FARXIGA) 10 MG TABS tablet Take 1 tablet (10 mg total) by mouth daily before breakfast. 90 tablet 3  ? doxycycline (VIBRA-TABS) 100 MG tablet     ? ELIQUIS 5 MG TABS tablet TAKE 1 TABLET TWICE A DAY 180 tablet 1  ? Exenatide ER 2 MG SRER Inject 2 mg into the skin once a week.     ? ferrous sulfate 325 (65 FE) MG EC tablet Take 325 mg by mouth 2 (two) times daily with a meal.    ? furosemide (LASIX) 20 MG tablet Take 20 mg by mouth. Take 2 tablets (40 mg) in am and 1 tablet (20 mg) at noon    ? gabapentin (NEURONTIN) 300 MG capsule 300 mg. Take 1 capsule am and noon, and 4 capsule (1223m) at bedtime    ? hydrALAZINE (APRESOLINE) 25 MG tablet Take 25 mg by mouth 3 (three) times daily.    ?  ketoconazole (NIZORAL) 2 % cream Apply topically at bedtime.    ? Lancets (ONETOUCH DELICA PLUS [id number]) MISC Apply 1 each topically daily.    ? levothyroxine (SYNTHROID, LEVOTHROID) 175 MCG tablet Take 175 mcg by mouth daily.    ? nystatin ointment (MYCOSTATIN) APPLY TOPICALLY TO THE AFFECTED AREA TWICE DAILY    ? polycarbophil (FIBERCON) 625 MG tablet Take 625 mg by mouth daily.    ? potassium chloride (KLOR-CON) 10 MEQ tablet Take 15 mEq by mouth daily.    ? sacubitril-valsartan (ENTRESTO) 49-51 MG Take 1 tablet by mouth 2 (two) times daily.    ? silver sulfADIAZINE (SILVADENE) 1 % cream Apply pea-sized amount to wound daily. 50 g 0  ? zolpidem (AMBIEN) 5 MG tablet Take 5 mg by mouth at bedtime as needed for sleep.    ? ?No  current facility-administered medications on file prior to visit.  ? ?Allergies  ?Allergen Reactions  ? Gabapentin Other (See Comments)  ?  Dizziness and drowsiness  ? Lisinopril Cough  ? ? ?No results found for this or any previous visit (from the past 2160 hour(s)). ? ?Objective: ?There were no vitals filed for this visit. ? ?General: Patient is awake, alert, oriented x 3 and in no acute distress. ? ?Dermatology: Skin is warm and dry bilateral with a partial thickness ulceration present distal tuft of the left hallux. Ulceration measures 1 cm x 0.8 cm x 0.1 cm. There is a macerated friable border with a granular base. The ulceration does not probe to bone. There is no malodor, no active drainage, minimal erythema, no edema.   ? ?Nails x9 are elongated and thickened consistent with onychomycosis. ?  ?Vascular: Dorsalis Pedis pulse = 1/4 Bilateral,  Posterior Tibial pulse = 1/4 Bilateral,  Capillary Fill Time < 5 seconds ? ?Neurologic: Gross sensation present via light touch to the left foot. ? ?Musculosketal: There is no pain with palpation to the ulcerated area at the left hallux. ? ?No results for input(s): GRAMSTAIN, LABORGA in the last 8760 hours. ? ?Assessment and Plan:  ?Problem List Items Addressed This Visit   ? ?  ? Endocrine  ? Diabetic polyneuropathy associated with type 2 diabetes mellitus ([place])  ? ?Other Visit Diagnoses   ? ? Diabetic ulcer of toe of left foot associated with type 2 diabetes mellitus, limited to breakdown of skin ([place])    -  Primary  ? PAD (peripheral artery disease) ([place])      ? Pain due to onychomycosis of toenails of both feet      ? ?  ? ? ?-Examined patient  ?-Mechanically debrided all painful mycotic nails x9 using a sterile nail nipper without incident ? -Excisionally dedbrided ulceration at left distal hallux to healthy bleeding borders removing nonviable tissue using a sterile chisel blade. Wound measures post debridement as above.  Wound was debrided to the level of the  dermis with viable wound base exposed to promote healing. Hemostasis was achieved with manuel pressure. Patient tolerated procedure well without any discomfort or anesthesia necessary for this wound debridement.  ?-Applied Iodosorb covered with gauze and Band-Aid dressing and advised patient's caregiver to assist with continuing to do the same daily and on the weekends may allow it to air out with a clean white sock but should continue with the surgical shoe at all times to prevent rubbing at the great toe ?- Advised patient to go to the ER or return to office if the wound worsens or if constitutional  symptoms are present. ?-Patient to return to office in 2 to 3 weeks for follow up wound care and evaluation or sooner if problems arise.  At next visit if patient is still having difficulty with healing wound we will refer her to the wound care center. ? ?Landis Martins, DPM ?

## 2022-01-24 NOTE — Progress Notes (Addendum)
Anesthesia Review:  PCP: Greig Right- Clearance 11/28/21  Cardiologist : Shirlee More - LOV 05/06/21  Coletta Memos, NP- clearance  dated 11/27/21 on chart   Monitor- 11/19/21  Chest x-ray : EKG :11/06/21  Echo : 02/19/21  Stress test: Cardiac Cath :  Activity level: cannot do a flgiht of stairs without difficulty  Sleep Study/ CPAP : none  Fasting Blood Sugar :      / Checks Blood Sugar -- times a day:   Blood Thinner/ Instructions /Last Dose: ASA / Instructions/ Last Dose :   Eliquis - POA at preop states paperwork at home.- IS aware to stop at least 2 days prior to surgery.   DM- type 2 Checks glucose once daily  Hgba1c- 01/28/22 - 7.0  BMP routed ot DR Justice Britain done 01/28/22. 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  Foolowed by neuro for a few visits after stroke not followed by neuro currently per POA PT has POA- [employer]- present at preop appt.  To bring POA papers in DOS.   Has caregiver at home 5 days per week - [place] caregiver not with agency.  Will be staying with her after surgery per pt.    Uses rolling walker for mobility  Is currently in a  wooden shoe on left foot for healing area ( ? ) Area is not open per pt and is very small.  Asked POA at preop was DR Supple aware of this and POA stated pt had been to office in wooden shoe. Followed by Dr Cannon Kettle LOV 01/15/22.  3 copies of preop instructions given to POA for caregiver, herself and pt.

## 2022-01-27 NOTE — Progress Notes (Signed)
DUE TO COVID-19 ONLY  2  VISITOR IS ALLOWED TO COME WITH YOU AND STAY IN THE WAITING ROOM ONLY DURING PRE OP AND PROCEDURE DAY OF SURGERY.   4  VISITOR  MAY VISIT WITH YOU AFTER SURGERY IN YOUR PRIVATE ROOM DURING VISITING HOURS ONLY! YOU MAY HAVE ONE PERSON SPEND THE NITE WITH YOU IN YOUR ROOM AFTER SURGERY.      Your procedure is scheduled on:         02/06/2022   Report to [facility] Main  Entrance   Report to admitting at         0945         AM DO NOT BRING INSURANCE CARD, PICTURE ID OR WALLET DAY OF SURGERY.      Call this number if you have problems the morning of surgery [phone]    REMEMBER: NO  SOLID FOODS , CANDY, GUM OR MINTS AFTER [place] .       Marland Kitchen CLEAR LIQUIDS UNTIL     0930am            DAY OF SURGERY.      PLEASE FINISH  g2 lower sugar  DRINK PER SURGEON ORDER  WHICH NEEDS TO BE COMPLETED AT    0930am        MORNING OF SURGERY.       CLEAR LIQUID DIET   Foods Allowed      WATER BLACK COFFEE ( SUGAR OK, NO MILK, CREAM OR CREAMER) REGULAR AND DECAF  TEA ( SUGAR OK NO MILK, CREAM, OR CREAMER) REGULAR AND DECAF  PLAIN JELLO ( NO RED)  FRUIT ICES ( NO RED, NO FRUIT PULP)  POPSICLES ( NO RED)  JUICE- APPLE, WHITE GRAPE AND WHITE CRANBERRY  SPORT DRINK LIKE GATORADE ( NO RED)  CLEAR BROTH ( VEGETABLE , CHICKEN OR BEEF)                                                                     BRUSH YOUR TEETH MORNING OF SURGERY AND RINSE YOUR MOUTH OUT, NO CHEWING GUM CANDY OR MINTS.     Take these medicines the morning of surgery with A SIP OF WATER:  wellbutrin, coreg, gabapentin, hydralazine synthroid   DO NOT TAKE ANY DIABETIC MEDICATIONS DAY OF YOUR SURGERY                               You may not have any metal on your body including hair pins and              piercings  Do not wear jewelry, make-up, lotions, powders or perfumes, deodorant             Do not wear nail polish on your fingernails.              IF YOU ARE A  FEMALE AND WANT TO SHAVE UNDER ARMS OR LEGS PRIOR TO SURGERY YOU MUST DO SO AT LEAST 48 HOURS PRIOR TO SURGERY.              Men may shave face and neck.   Do not bring valuables to the hospital. [facility]  IS NOT             RESPONSIBLE   FOR VALUABLES.  Contacts, dentures or bridgework may not be worn into surgery.  Leave suitcase in the car. After surgery it may be brought to your room.     Patients discharged the day of surgery will not be allowed to drive home. IF YOU ARE HAVING SURGERY AND GOING HOME THE SAME DAY, YOU MUST HAVE AN ADULT TO DRIVE YOU HOME AND BE WITH YOU FOR 24 HOURS. YOU MAY GO HOME BY TAXI OR UBER OR ORTHERWISE, BUT AN ADULT MUST ACCOMPANY YOU HOME AND STAY WITH YOU FOR 24 HOURS.                Please read over the following fact sheets you were given: _____________________________________________________________________  [facility] - Preparing for Surgery Before surgery, you can play an important role.  Because skin is not sterile, your skin needs to be as free of germs as possible.  You can reduce the number of germs on your skin by washing with CHG (chlorahexidine gluconate) soap before surgery.  CHG is an antiseptic cleaner which kills germs and bonds with the skin to continue killing germs even after washing. Please DO NOT use if you have an allergy to CHG or antibacterial soaps.  If your skin becomes reddened/irritated stop using the CHG and inform your nurse when you arrive at Short Stay. Do not shave (including legs and underarms) for at least 48 hours prior to the first CHG shower.  You may shave your face/neck. Please follow these instructions carefully:  1.  Shower with CHG Soap the night before surgery and the  morning of Surgery.  2.  If you choose to wash your hair, wash your hair first as usual with your  normal  shampoo.  3.  After you shampoo, rinse your hair and body thoroughly to remove the  shampoo.                           4.  Use CHG as you would  any other liquid soap.  You can apply chg directly  to the skin and wash                       Gently with a scrungie or clean washcloth.  5.  Apply the CHG Soap to your body ONLY FROM THE NECK DOWN.   Do not use on face/ open                           Wound or open sores. Avoid contact with eyes, ears mouth and genitals (private parts).                       Wash face,  Genitals (private parts) with your normal soap.             6.  Wash thoroughly, paying special attention to the area where your surgery  will be performed.  7.  Thoroughly rinse your body with warm water from the neck down.  8.  DO NOT shower/wash with your normal soap after using and rinsing off  the CHG Soap.                9.  Pat yourself dry with a clean towel.  10.  Wear clean pajamas.            11.  Place clean sheets on your bed the night of your first shower and do not  sleep with pets. Day of Surgery : Do not apply any lotions/deodorants the morning of surgery.  Please wear clean clothes to the hospital/surgery center.  FAILURE TO FOLLOW THESE INSTRUCTIONS MAY RESULT IN THE CANCELLATION OF YOUR SURGERY PATIENT SIGNATURE_________________________________  NURSE SIGNATURE__________________________________  ________________________________________________________________________

## 2022-01-28 ENCOUNTER — Other Ambulatory Visit: Payer: Self-pay

## 2022-01-28 ENCOUNTER — Encounter (HOSPITAL_COMMUNITY): Payer: Self-pay

## 2022-01-28 ENCOUNTER — Encounter (HOSPITAL_COMMUNITY)
Admission: RE | Admit: 2022-01-28 | Discharge: 2022-01-28 | Disposition: A | Discharge status: Home / Self Care | Payer: Medicare Other | Source: Ambulatory Visit | Attending: Orthopedic Surgery | Admitting: Orthopedic Surgery

## 2022-01-28 VITALS — BP 154/82 | HR 63 | Temp 98.2°F | Resp 16 | Ht 67.0 in | Wt 201.0 lb

## 2022-01-28 DIAGNOSIS — M75102 Unspecified rotator cuff tear or rupture of left shoulder, not specified as traumatic: Secondary | ICD-10-CM | POA: Diagnosis not present

## 2022-01-28 DIAGNOSIS — Z01818 Encounter for other preprocedural examination: Secondary | ICD-10-CM

## 2022-01-28 DIAGNOSIS — I4891 Unspecified atrial fibrillation: Secondary | ICD-10-CM | POA: Insufficient documentation

## 2022-01-28 DIAGNOSIS — I4892 Unspecified atrial flutter: Secondary | ICD-10-CM | POA: Insufficient documentation

## 2022-01-28 DIAGNOSIS — E1122 Type 2 diabetes mellitus with diabetic chronic kidney disease: Secondary | ICD-10-CM | POA: Diagnosis not present

## 2022-01-28 DIAGNOSIS — E039 Hypothyroidism, unspecified: Secondary | ICD-10-CM | POA: Diagnosis not present

## 2022-01-28 DIAGNOSIS — Z87891 Personal history of nicotine dependence: Secondary | ICD-10-CM | POA: Diagnosis not present

## 2022-01-28 DIAGNOSIS — N189 Chronic kidney disease, unspecified: Secondary | ICD-10-CM | POA: Insufficient documentation

## 2022-01-28 DIAGNOSIS — I130 Hypertensive heart and chronic kidney disease with heart failure and stage 1 through stage 4 chronic kidney disease, or unspecified chronic kidney disease: Secondary | ICD-10-CM | POA: Insufficient documentation

## 2022-01-28 DIAGNOSIS — Z7901 Long term (current) use of anticoagulants: Secondary | ICD-10-CM | POA: Insufficient documentation

## 2022-01-28 DIAGNOSIS — S42302A Unspecified fracture of shaft of humerus, left arm, initial encounter for closed fracture: Secondary | ICD-10-CM | POA: Insufficient documentation

## 2022-01-28 DIAGNOSIS — Y939 Activity, unspecified: Secondary | ICD-10-CM | POA: Insufficient documentation

## 2022-01-28 DIAGNOSIS — G4733 Obstructive sleep apnea (adult) (pediatric): Secondary | ICD-10-CM | POA: Insufficient documentation

## 2022-01-28 DIAGNOSIS — Z01812 Encounter for preprocedural laboratory examination: Secondary | ICD-10-CM | POA: Insufficient documentation

## 2022-01-28 DIAGNOSIS — I447 Left bundle-branch block, unspecified: Secondary | ICD-10-CM | POA: Diagnosis not present

## 2022-01-28 DIAGNOSIS — Y929 Unspecified place or not applicable: Secondary | ICD-10-CM | POA: Diagnosis not present

## 2022-01-28 DIAGNOSIS — I5022 Chronic systolic (congestive) heart failure: Secondary | ICD-10-CM | POA: Insufficient documentation

## 2022-01-28 DIAGNOSIS — E139 Other specified diabetes mellitus without complications: Secondary | ICD-10-CM

## 2022-01-28 DIAGNOSIS — Z8673 Personal history of transient ischemic attack (TIA), and cerebral infarction without residual deficits: Secondary | ICD-10-CM | POA: Diagnosis not present

## 2022-01-28 HISTORY — DX: Chronic kidney disease, unspecified: N18.9

## 2022-01-28 HISTORY — DX: Hypothyroidism, unspecified: E03.9

## 2022-01-28 LAB — BASIC METABOLIC PANEL
Anion gap: 7 (ref 5–15)
BUN: 64 mg/dL — ABNORMAL HIGH (ref 8–23)
CO2: 26 mmol/L (ref 22–32)
Calcium: 9 mg/dL (ref 8.9–10.3)
Chloride: 109 mmol/L (ref 98–111)
Creatinine, Ser: 1.62 mg/dL — ABNORMAL HIGH (ref 0.44–1.00)
GFR, Estimated: 33 mL/min — ABNORMAL LOW (ref >60)
Glucose, Bld: 109 mg/dL — ABNORMAL HIGH (ref 70–99)
Potassium: 4.9 mmol/L (ref 3.5–5.1)
Sodium: 142 mmol/L (ref 135–145)

## 2022-01-28 LAB — GLUCOSE, CAPILLARY: Glucose-Capillary: 102 mg/dL — ABNORMAL HIGH (ref 70–99)

## 2022-01-28 LAB — CBC
HCT: 39 % (ref 36.0–46.0)
Hemoglobin: 12.6 g/dL (ref 12.0–15.0)
MCH: 35 pg — ABNORMAL HIGH (ref 26.0–34.0)
MCHC: 32.3 g/dL (ref 30.0–36.0)
MCV: 108.3 fL — ABNORMAL HIGH (ref 80.0–100.0)
Platelets: 142 10*3/uL — ABNORMAL LOW (ref 150–400)
RBC: 3.6 MIL/uL — ABNORMAL LOW (ref 3.87–5.11)
RDW: 13.5 % (ref 11.5–15.5)
WBC: 5.5 10*3/uL (ref 4.0–10.5)
nRBC: 0 % (ref 0.0–0.2)

## 2022-01-28 LAB — SURGICAL PCR SCREEN
MRSA, PCR: NEGATIVE
Staphylococcus aureus: NEGATIVE

## 2022-01-28 LAB — HEMOGLOBIN A1C
Hgb A1c MFr Bld: 7 % — ABNORMAL HIGH (ref 4.8–5.6)
Mean Plasma Glucose: 154.2 mg/dL

## 2022-01-28 NOTE — Progress Notes (Signed)
[facility]- Preparing for Total Shoulder Arthroplasty  °  °Before surgery, you can play an important role. Because skin is not sterile, your skin needs to be as free of germs as possible. You can reduce the number of germs on your skin by using the following products. °Benzoyl Peroxide Gel °Reduces the number of germs present on the skin °Applied twice a day to shoulder area starting two days before surgery   ° °================================================================== ° °Please follow these instructions carefully: ° °BENZOYL PEROXIDE 5% GEL ° °Please do not use if you have an allergy to benzoyl peroxide.   If your skin becomes reddened/irritated stop using the benzoyl peroxide. ° °Starting two days before surgery, apply as follows: °Apply benzoyl peroxide in the morning and at night. Apply after taking a shower. If you are not taking a shower clean entire shoulder front, back, and side along with the armpit with a clean wet washcloth. ° °Place a quarter-sized dollop on your shoulder and rub in thoroughly, making sure to cover the front, back, and side of your shoulder, along with the armpit.  ° °2 days before ____ AM   ____ PM              1 day before ____ AM   ____ PM °                        °Do this twice a day for two days.  (Last application is the night before surgery, AFTER using the CHG soap as described below). ° °Do NOT apply benzoyl peroxide gel on the day of surgery.  °

## 2022-01-29 NOTE — Progress Notes (Signed)
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 former smoker with h/o HTN, OSA, CKD, CHF, atrial flutter on Eliquis, chronic LBBB, DM II, stroke with expressive aphasia, hypothyroidism, left shoulder OA scheduled for above procedure 02/06/22 with Dr. KJustice Britain   Per cardiology preoperative evaluation 11/27/21, "Chart reviewed as part of pre-operative protocol coverage. Given past medical history and time since last visit, based on ACC/AHA guidelines, KHANLEY RISPOLIwould be at acceptable risk for the planned procedure without further cardiovascular testing.    Patient with diagnosis of atrial fibrillation on Eliquis for anticoagulation.    Per office protocol, patient can hold Eliquis for 3 days prior to procedure.   Patient will not need bridging with Lovenox (enoxaparin) around procedure."  Cleared by PCP.   Anticipate pt can proceed with planned procedure barring acute status change.   VS: BP (!) 154/82   Pulse 63   Temp 36.8 C (Oral)   Resp 16   Ht '5\' 7"'  (1.702 m)   Wt 91.2 kg   SpO2 100%   BMI 31.48 kg/m   PROVIDERS: PGreig Right MD is PCP   Primary Cardiologist: BShirlee More MD  LABS: Labs reviewed: Acceptable for surgery. (all labs ordered are listed, but only abnormal results are displayed)  Labs Reviewed  CBC - Abnormal; Notable for the following components:      Result Value   RBC 3.60 (*)    MCV 108.3 (*)    MCH 35.0 (*)    Platelets 142 (*)    All other components within normal limits  BASIC METABOLIC PANEL - Abnormal; Notable for the following components:   Glucose, Bld 109 (*)    BUN 64 (*)    Creatinine, Ser 1.62 (*)    GFR, Estimated 33 (*)    All  other components within normal limits  HEMOGLOBIN A1C - Abnormal; Notable for the following components:   Hgb A1c MFr Bld 7.0 (*)    All other components within normal limits  GLUCOSE, CAPILLARY - Abnormal; Notable for the following components:   Glucose-Capillary 102 (*)    All other components within normal limits  SURGICAL PCR SCREEN     IMAGES:   EKG: 11/06/21 Rate 61bpm  A flutter  LAD LBBB  CV: Echo 02/19/2021 1. Left ventricular ejection fraction, by estimation, is 50 to 55%. The  left ventricle has low normal function. The left ventricle has no regional  wall motion abnormalities. There is moderate left ventricular hypertrophy.   2. Right ventricular systolic function is normal. The right ventricular  size is normal. There is moderately elevated pulmonary artery systolic  pressure.   3. Left atrial size was severely dilated.   4. The mitral valve is normal in structure. Severe mitral valve  regurgitation. No evidence of mitral stenosis.   5. The aortic valve is normal in structure. Aortic valve regurgitation is  not visualized. No aortic stenosis is present.   6. The inferior vena cava is normal in size with greater than 50%  respiratory variability, suggesting right atrial pressure of 3 mmHg.  Past Medical History:  Diagnosis Date   Acquired hammer toes of both feet 01/22/2017   Arthritis  11/29/2013   Atrial flutter ([place]) 01/24/2014   Overview:  CHADS2 vasc score= 5   B-complex deficiency 11/29/2013   Cerebral artery occlusion with cerebral infarction ([place]) 12/04/2013   Overview:  STORY: MRI +ve Left frontoparietal infarction (LMCA). +AFib.`E1o3L`IMPRESSION: BP is under controlled. Continue home health. +AFib, con't Eliquis 64m bid. [facility]   Chronic kidney disease    Chronic systolic (congestive) heart failure (HCC)    Diabetic polyneuropathy associated with type 2 diabetes mellitus ([place] 01/22/2017   Essential  hypertension 11/13/2015   Hyperlipemia, retention 11/13/2015   Hypothyroidism    OSA (obstructive sleep apnea) 01/24/2014   CPAP   Pre-ulcerative corn or callous 01/22/2017   Stroke ([place] 01/24/2014   has some expressive aphasia   Thyroid disease 11/29/2013   Type II diabetes mellitus with ophthalmic manifestations ([place] 11/29/2013    Past Surgical History:  Procedure Laterality Date   APPENDECTOMY     CHOLECYSTECTOMY  2009   FRACTURE SURGERY  1954   arm   GASTROCYSTOPLASTY  2006   Gastric Bypass   KNEE SURGERY  2001   Knee replacement    MEDICATIONS:  ACCU-CHEK GUIDE test strip   alendronate (FOSAMAX) 70 MG tablet   atorvastatin (LIPITOR) 40 MG tablet   BD PEN NEEDLE NANO 2ND GEN 32G X 4 MM MISC   Blood Glucose Monitoring Suppl (ACCU-CHEK GUIDE) w/Device KIT   buPROPion (WELLBUTRIN XL) 300 MG 24 hr tablet   cadexomer iodine (IODOSORB) 0.9 % gel   carvedilol (COREG) 25 MG tablet   Cholecalciferol (VITAMIN D) 2000 units CAPS   dapagliflozin propanediol (FARXIGA) 10 MG TABS tablet   ELIQUIS 5 MG TABS tablet   Exenatide ER 2 MG SRER   ferrous sulfate 325 (65 FE) MG EC tablet   Fluticasone Propionate (ALLERGY RELIEF NA)   furosemide (LASIX) 20 MG tablet   gabapentin (NEURONTIN) 300 MG capsule   hydrALAZINE (APRESOLINE) 25 MG tablet   ketoconazole (NIZORAL) 2 % cream   Lancets (ONETOUCH DELICA PLUS [id number] MISC   levothyroxine (SYNTHROID, LEVOTHROID) 175 MCG tablet   potassium chloride (KLOR-CON) 10 MEQ tablet   PSYLLIUM HUSK PO   sacubitril-valsartan (ENTRESTO) 49-51 MG   silver sulfADIAZINE (SILVADENE) 1 % cream   vitamin B-12 (CYANOCOBALAMIN) 1000 MCG tablet   zolpidem (AMBIEN) 5 MG tablet   No current facility-administered medications for this encounter.    JKonrad FelixWard, PA-C [facility] ([phone]

## 2022-01-29 NOTE — Anesthesia Preprocedure Evaluation (Addendum)
Anesthesia Evaluation  Patient identified by MRN, date of birth, ID band Patient awake    Reviewed: Allergy & Precautions, NPO status , Patient's Chart, lab work & pertinent test results  Airway Mallampati: II  TM Distance: >3 FB Neck ROM: Full    Dental no notable dental hx.    Pulmonary sleep apnea and Continuous Positive Airway Pressure Ventilation , former smoker,    Pulmonary exam normal        Cardiovascular hypertension, Pt. on medications and Pt. on home beta blockers +CHF  + dysrhythmias Atrial Fibrillation  Rhythm:Irregular Rate:Normal     Neuro/Psych CVA, Residual Symptoms negative psych ROS   GI/Hepatic negative GI ROS, Neg liver ROS,   Endo/Other  diabetes, Type 2Hypothyroidism   Renal/GU CRFRenal disease  negative genitourinary   Musculoskeletal  (+) Arthritis , Osteoarthritis,    Abdominal Normal abdominal exam  (+)   Peds  Hematology negative hematology ROS (+)   Anesthesia Other Findings   Reproductive/Obstetrics                           Anesthesia Physical Anesthesia Plan  ASA: 3  Anesthesia Plan: General and Regional   Post-op Pain Management: Regional block*   Induction: Intravenous  PONV Risk Score and Plan: 3 and Ondansetron, Dexamethasone and Treatment may vary due to age or medical condition  Airway Management Planned: Mask and Oral ETT  Additional Equipment: None  Intra-op Plan:   Post-operative Plan: Extubation in OR  Informed Consent: I have reviewed the patients History and Physical, chart, labs and discussed the procedure including the risks, benefits and alternatives for the proposed anesthesia with the patient or authorized representative who has indicated his/her understanding and acceptance.     Dental advisory given  Plan Discussed with: CRNA  Anesthesia Plan Comments: (See PAT note 01/28/2022  Lab Results      Component                 Value               Date                      WBC                      5.5                 01/28/2022                HGB                      12.6                01/28/2022                HCT                      39.0                01/28/2022                MCV                      108.3 (H)           01/28/2022                PLT  142 (L)             01/28/2022            Lab Results      Component                Value               Date                      NA                       142                 01/28/2022                K                        4.9                 01/28/2022                CO2                      26                  01/28/2022                GLUCOSE                  109 (H)             01/28/2022                BUN                      64 (H)              01/28/2022                CREATININE               1.62 (H)            01/28/2022                CALCIUM                  9.0                 01/28/2022                EGFR                     31 (L)              01/25/2021                GFRNONAA                 33 (L)              01/28/2022            Echo 02/19/2021 1. Left ventricular ejection fraction, by estimation, is 50 to 55%. The  left ventricle has low normal function. The left ventricle has no regional  wall motion abnormalities. There is moderate left ventricular hypertrophy.  2. Right ventricular systolic function is normal. The right ventricular  size is normal. There  is moderately elevated pulmonary artery systolic  pressure.  3. Left atrial size was severely dilated.  4. The mitral valve is normal in structure. Severe mitral valve  regurgitation. No evidence of mitral stenosis.  5. The aortic valve is normal in structure. Aortic valve regurgitation is  not visualized. No aortic stenosis is present.  6. The inferior vena cava is normal in size with greater than 50%  respiratory variability, suggesting right atrial  pressure of 3 mmHg. )       Anesthesia Quick Evaluation

## 2022-01-30 ENCOUNTER — Telehealth: Payer: Self-pay | Admitting: Cardiology

## 2022-01-30 NOTE — Telephone Encounter (Signed)
Pt c/o medication issue:  1. Name of Medication:   2. How are you currently taking this medication (dosage and times per day)? ELIQUIS 5 MG TABS tablet  3. Are you having a reaction (difficulty breathing--STAT)? No  4. What is your medication issue?  Pt's caregiver is calling to find out when this medication needs to be stopped before her surgery on 06/01 and when it should be resumed after. Please advise.

## 2022-01-30 NOTE — Telephone Encounter (Signed)
Debra Mcconnell is aware unable to speak to as she is not on [employer] list. She stated will stop by the office with patient to update DPR and obtain the requested information.

## 2022-02-05 ENCOUNTER — Encounter: Payer: Self-pay | Admitting: Sports Medicine

## 2022-02-05 ENCOUNTER — Ambulatory Visit (INDEPENDENT_AMBULATORY_CARE_PROVIDER_SITE_OTHER): Payer: Medicare Other | Admitting: Sports Medicine

## 2022-02-05 DIAGNOSIS — E11621 Type 2 diabetes mellitus with foot ulcer: Secondary | ICD-10-CM

## 2022-02-05 DIAGNOSIS — I739 Peripheral vascular disease, unspecified: Secondary | ICD-10-CM | POA: Diagnosis not present

## 2022-02-05 DIAGNOSIS — L97521 Non-pressure chronic ulcer of other part of left foot limited to breakdown of skin: Secondary | ICD-10-CM | POA: Diagnosis not present

## 2022-02-05 NOTE — Progress Notes (Signed)
Subjective: Debra Mcconnell is a 76 y.o. female patient seen in office for follow-up evaluation of ulceration of the left great toe.  Patient is assisted by her home aide who reports that things look much better no drainage and the wound appears to be scabbed over at the left great toe.  She has been helping patient apply Iodosorb as directed and denies any issues or noticing any constitutional symptoms at this time.  Fasting blood sugar not recorded.   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 10/04/2019   Bilateral sensorineural hearing loss 05/17/2019   Sudden right hearing loss 05/17/2019   Comprehensive diabetic foot examination, type 2 DM, encounter for ([place]) 11/11/2018   Chronic anticoagulation 04/16/2017   Acquired hammer toes of both feet 01/22/2017   Diabetic polyneuropathy associated with type 2 diabetes mellitus ([place]) 01/22/2017   Pre-ulcerative corn or callous 01/22/2017   Essential hypertension 11/13/2015   Hyperlipemia, retention 11/13/2015   Atrial flutter ([place]) 01/24/2014   OSA (obstructive sleep apnea) 01/24/2014   Stroke ([place]) 01/24/2014   Cerebral artery occlusion with cerebral infarction ([place]) 12/04/2013   Arthritis 11/29/2013   B-complex deficiency 11/29/2013   Sleep apnea 11/29/2013   Thyroid disease 11/29/2013   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     atorvastatin (LIPITOR) 40 MG tablet Take 40  mg by mouth at bedtime.     BD PEN NEEDLE NANO 2ND GEN 32G X 4 MM MISC USE WITH PEN AS DIRECTED.     Blood Glucose Monitoring Suppl (ACCU-CHEK GUIDE) w/Device KIT USE TO CHECK BLOOD SUGAR DAILY     buPROPion (WELLBUTRIN XL) 300 MG 24 hr tablet Take 300 mg by mouth daily.     cadexomer iodine (IODOSORB) 0.9 % gel Apply 1 application. topically daily as needed for wound care. 40 g 0   carvedilol (COREG) 25 MG tablet Take 25 mg by mouth 2 (two) times daily with a meal.     Cholecalciferol (VITAMIN D) 2000 units CAPS Take 4,000 Units by mouth daily.     dapagliflozin propanediol (FARXIGA) 10 MG TABS tablet Take 1 tablet (10 mg total) by mouth daily before breakfast. 90 tablet 3   ELIQUIS 5 MG TABS tablet TAKE 1 TABLET TWICE A DAY 180 tablet 1   Exenatide ER 2 MG SRER Inject 2 mg into the skin every Sunday.     ferrous sulfate 325 (65 FE) MG EC tablet Take 325 mg by mouth 2 (two) times daily with a meal.     Fluticasone Propionate (ALLERGY RELIEF NA) Place 1 spray into the nose daily as needed (allergies).     furosemide (LASIX) 20 MG tablet Take 20-40 mg by mouth See admin instructions. Take 2 tablets (40 mg) in am and 1 tablet (20 mg) at noon     gabapentin (NEURONTIN) 300 MG capsule Take 300-1,200 mg by mouth See admin instructions. Take 300 mg in the morning  and 300 mg at noon, and 4 capsule (122m) at bedtime     hydrALAZINE (APRESOLINE) 25 MG tablet Take 25 mg by mouth 3 (three) times daily.     ketoconazole (NIZORAL) 2 % cream Apply 1 application. topically daily as needed for irritation.     Lancets (ONETOUCH DELICA PLUS [id number] MISC Apply 1 each topically daily.     levothyroxine (SYNTHROID, LEVOTHROID) 175 MCG tablet Take 175 mcg by mouth daily.     potassium chloride (KLOR-CON) 10 MEQ tablet Take 15 mEq by mouth daily at 12 noon.     PSYLLIUM HUSK PO Take 1 tablet by mouth daily.     sacubitril-valsartan (ENTRESTO) 49-51 MG Take 1 tablet by mouth 2 (two) times daily.     silver  sulfADIAZINE (SILVADENE) 1 % cream Apply pea-sized amount to wound daily. (Patient taking differently: Apply 1 application. topically daily as needed (wound care). Apply pea-sized amount to wound daily.) 50 g 0   vitamin B-12 (CYANOCOBALAMIN) 1000 MCG tablet Take 1,000 mcg by mouth daily.     zolpidem (AMBIEN) 5 MG tablet Take 5 mg by mouth at bedtime as needed for sleep.     No current facility-administered medications on file prior to visit.   Allergies  Allergen Reactions   Lisinopril Cough    Recent Results (from the past 2160 hour(s))  Surgical pcr screen     Status: None   Collection Time: 01/28/22  1:55 PM   Specimen: Nasal Mucosa; Nasal Swab  Result Value Ref Range   MRSA, PCR NEGATIVE NEGATIVE   Staphylococcus aureus NEGATIVE NEGATIVE    Comment: (NOTE) The Xpert SA Assay (FDA approved for NASAL specimens in patients 294years of age and older), is one component of a comprehensive surveillance program. It is not intended to diagnose infection nor to guide or monitor treatment. Performed at [facility] [address], [place] [address]  CBC per protocol     Status: Abnormal   Collection Time: 01/28/22  2:30 PM  Result Value Ref Range   WBC 5.5 4.0 - 10.5 K/uL   RBC 3.60 (L) 3.87 - 5.11 MIL/uL   Hemoglobin 12.6 12.0 - 15.0 g/dL   HCT 39.0 36.0 - 46.0 %   MCV 108.3 (H) 80.0 - 100.0 fL   MCH 35.0 (H) 26.0 - 34.0 pg   MCHC 32.3 30.0 - 36.0 g/dL   RDW 13.5 11.5 - 15.5 %   Platelets 142 (L) 150 - 400 K/uL   nRBC 0.0 0.0 - 0.2 %    Comment: Performed at [facility] [id number] [place] [address] Basic metabolic panel per protocol     Status: Abnormal   Collection Time: 01/28/22  2:30 PM  Result Value Ref Range   Sodium 142 135 - 145 mmol/L   Potassium 4.9 3.5 - 5.1 mmol/L   Chloride 109 98 - 111 mmol/L   CO2 26 22 - 32 mmol/L   Glucose, Bld 109 (H) 70 - 99 mg/dL    Comment: Glucose reference range applies  only to samples taken after fasting for at least 8 hours.   BUN 64 (H) 8 - 23 mg/dL   Creatinine, Ser 1.62 (H) 0.44 - 1.00 mg/dL   Calcium 9.0 8.9 - 10.3 mg/dL   GFR, Estimated 33 (L) >60 mL/min    Comment: (NOTE) Calculated using the CKD-EPI Creatinine Equation (2021)    Anion gap 7 5 - 15  Comment: Performed at [facility], [place] [address], [place], [place] [address]  Hemoglobin A1c per protocol     Status: Abnormal   Collection Time: 01/28/22  2:30 PM  Result Value Ref Range   Hgb A1c MFr Bld 7.0 (H) 4.8 - 5.6 %    Comment: (NOTE) Pre diabetes:          5.7%-6.4%  Diabetes:              >6.4%  Glycemic control for   <7.0% adults with diabetes    Mean Plasma Glucose 154.2 mg/dL    Comment: Performed at [facility], [place] [address] [place], [place] [address]  Glucose, capillary     Status: Abnormal   Collection Time: 01/28/22  2:42 PM  Result Value Ref Range   Glucose-Capillary 102 (H) 70 - 99 mg/dL    Comment: Glucose reference range applies only to samples taken after fasting for at least 8 hours.    Objective: There were no vitals filed for this visit.  General: Patient is awake, alert, oriented x 3 and in no acute distress.  Dermatology: Skin is warm and dry bilateral with a now healed ulceration at the left hallux distal tuft, reactive keratosis, no underlying opening. There is no malodor, no active drainage, no erythema, no edema.    Nails x9 are elongated and thickened consistent with onychomycosis.   Vascular: Dorsalis Pedis pulse = 1/4 Bilateral,  Posterior Tibial pulse = 1/4 Bilateral,  Capillary Fill Time < 5 seconds  Neurologic: Gross sensation present via light touch to the left foot.  Musculosketal: There is no pain with palpation to the healed ulcerated area at the left hallux.  No results for input(s): GRAMSTAIN, LABORGA in the last 8760 hours.  Assessment and Plan:  Problem List Items Addressed This Visit   None Visit  Diagnoses     Diabetic ulcer of toe of left foot associated with type 2 diabetes mellitus, limited to breakdown of skin ([place])    -  Primary   PAD (peripheral artery disease) ([place])           -Examined patient  -Mechanically debrided reactive keratosis that was noted at the left hallux distal tuft removing any scab and there was no underlying opening noted previous wound appears to be prematurely healed -Advised patient to continue with clean white sock and surgical shoe for 1 more weeks and after a week may slowly transition to a normal shoe as directed with close monitoring -If wound recurs to resume using Iodosorb and to return to office for reevaluation otherwise may return as needed for diabetic foot care  Landis Martins, DPM

## 2022-02-06 ENCOUNTER — Ambulatory Visit (HOSPITAL_COMMUNITY)
Admission: RE | Admit: 2022-02-06 | Discharge: 2022-02-06 | Disposition: A | Discharge status: Home / Self Care | Payer: Medicare Other | Source: Ambulatory Visit | Attending: Orthopedic Surgery | Admitting: Orthopedic Surgery

## 2022-02-06 ENCOUNTER — Encounter (HOSPITAL_COMMUNITY): Payer: Self-pay | Admitting: Orthopedic Surgery

## 2022-02-06 ENCOUNTER — Encounter (HOSPITAL_COMMUNITY)
Admission: RE | Disposition: A | Discharge status: Home / Self Care | Payer: Self-pay | Source: Ambulatory Visit | Attending: Orthopedic Surgery

## 2022-02-06 ENCOUNTER — Ambulatory Visit (HOSPITAL_BASED_OUTPATIENT_CLINIC_OR_DEPARTMENT_OTHER): Payer: Medicare Other | Admitting: Anesthesiology

## 2022-02-06 ENCOUNTER — Ambulatory Visit (HOSPITAL_COMMUNITY): Payer: Medicare Other | Admitting: Physician Assistant

## 2022-02-06 ENCOUNTER — Other Ambulatory Visit: Payer: Self-pay

## 2022-02-06 DIAGNOSIS — N189 Chronic kidney disease, unspecified: Secondary | ICD-10-CM | POA: Insufficient documentation

## 2022-02-06 DIAGNOSIS — G8918 Other acute postprocedural pain: Secondary | ICD-10-CM | POA: Diagnosis not present

## 2022-02-06 DIAGNOSIS — Z8673 Personal history of transient ischemic attack (TIA), and cerebral infarction without residual deficits: Secondary | ICD-10-CM | POA: Diagnosis not present

## 2022-02-06 DIAGNOSIS — X58XXXA Exposure to other specified factors, initial encounter: Secondary | ICD-10-CM | POA: Insufficient documentation

## 2022-02-06 DIAGNOSIS — I5022 Chronic systolic (congestive) heart failure: Secondary | ICD-10-CM | POA: Diagnosis not present

## 2022-02-06 DIAGNOSIS — S42202P Unspecified fracture of upper end of left humerus, subsequent encounter for fracture with malunion: Secondary | ICD-10-CM

## 2022-02-06 DIAGNOSIS — I130 Hypertensive heart and chronic kidney disease with heart failure and stage 1 through stage 4 chronic kidney disease, or unspecified chronic kidney disease: Secondary | ICD-10-CM | POA: Insufficient documentation

## 2022-02-06 DIAGNOSIS — M12812 Other specific arthropathies, not elsewhere classified, left shoulder: Secondary | ICD-10-CM

## 2022-02-06 DIAGNOSIS — G4730 Sleep apnea, unspecified: Secondary | ICD-10-CM | POA: Insufficient documentation

## 2022-02-06 DIAGNOSIS — I4891 Unspecified atrial fibrillation: Secondary | ICD-10-CM | POA: Insufficient documentation

## 2022-02-06 DIAGNOSIS — Z01818 Encounter for other preprocedural examination: Secondary | ICD-10-CM

## 2022-02-06 DIAGNOSIS — S42202A Unspecified fracture of upper end of left humerus, initial encounter for closed fracture: Secondary | ICD-10-CM | POA: Diagnosis not present

## 2022-02-06 DIAGNOSIS — E1122 Type 2 diabetes mellitus with diabetic chronic kidney disease: Secondary | ICD-10-CM | POA: Diagnosis not present

## 2022-02-06 DIAGNOSIS — M19012 Primary osteoarthritis, left shoulder: Secondary | ICD-10-CM | POA: Diagnosis not present

## 2022-02-06 DIAGNOSIS — E039 Hypothyroidism, unspecified: Secondary | ICD-10-CM | POA: Diagnosis not present

## 2022-02-06 DIAGNOSIS — M75102 Unspecified rotator cuff tear or rupture of left shoulder, not specified as traumatic: Secondary | ICD-10-CM | POA: Diagnosis not present

## 2022-02-06 DIAGNOSIS — G4733 Obstructive sleep apnea (adult) (pediatric): Secondary | ICD-10-CM | POA: Diagnosis not present

## 2022-02-06 DIAGNOSIS — Z9989 Dependence on other enabling machines and devices: Secondary | ICD-10-CM | POA: Diagnosis not present

## 2022-02-06 HISTORY — PX: REVERSE SHOULDER ARTHROPLASTY: SHX5054

## 2022-02-06 LAB — GLUCOSE, CAPILLARY
Glucose-Capillary: 119 mg/dL — ABNORMAL HIGH (ref 70–99)
Glucose-Capillary: 122 mg/dL — ABNORMAL HIGH (ref 70–99)

## 2022-02-06 SURGERY — ARTHROPLASTY, SHOULDER, TOTAL, REVERSE
Anesthesia: Regional | Site: Shoulder | Laterality: Left

## 2022-02-06 MED ORDER — DEXAMETHASONE SODIUM PHOSPHATE 10 MG/ML IJ SOLN
INTRAMUSCULAR | Status: DC | PRN
  Administered 2022-02-06: 6 mg via INTRAVENOUS

## 2022-02-06 MED ORDER — ONDANSETRON HCL 4 MG/2ML IJ SOLN
INTRAMUSCULAR | Status: AC
  Filled 2022-02-06: qty 2

## 2022-02-06 MED ORDER — FENTANYL CITRATE (PF) 100 MCG/2ML IJ SOLN
INTRAMUSCULAR | Status: DC | PRN
  Administered 2022-02-06: 100 ug via INTRAVENOUS

## 2022-02-06 MED ORDER — EPHEDRINE 5 MG/ML INJ
INTRAVENOUS | Status: AC
  Filled 2022-02-06: qty 5

## 2022-02-06 MED ORDER — PROPOFOL 10 MG/ML IV BOLUS
INTRAVENOUS | Status: AC
  Filled 2022-02-06: qty 20

## 2022-02-06 MED ORDER — 0.9 % SODIUM CHLORIDE (POUR BTL) OPTIME
TOPICAL | Status: DC | PRN
  Administered 2022-02-06: 1000 mL

## 2022-02-06 MED ORDER — PHENYLEPHRINE HCL (PRESSORS) 10 MG/ML IV SOLN
INTRAVENOUS | Status: AC
  Filled 2022-02-06: qty 1

## 2022-02-06 MED ORDER — ORAL CARE MOUTH RINSE: 15.0000 mL | Freq: Once | OROMUCOSAL | Status: AC

## 2022-02-06 MED ORDER — TRANEXAMIC ACID-NACL 1000-0.7 MG/100ML-% IV SOLN
1000.0000 mg | INTRAVENOUS | Status: AC
  Administered 2022-02-06: 1000 mg via INTRAVENOUS
  Filled 2022-02-06: qty 100

## 2022-02-06 MED ORDER — VANCOMYCIN HCL 1000 MG IV SOLR
INTRAVENOUS | Status: DC | PRN
  Administered 2022-02-06: 1000 mg via TOPICAL

## 2022-02-06 MED ORDER — PHENYLEPHRINE HCL-NACL 20-0.9 MG/250ML-% IV SOLN
INTRAVENOUS | Status: DC | PRN
  Administered 2022-02-06: 50 ug/min via INTRAVENOUS

## 2022-02-06 MED ORDER — VANCOMYCIN HCL 1000 MG IV SOLR
INTRAVENOUS | Status: AC
  Filled 2022-02-06: qty 20

## 2022-02-06 MED ORDER — CYCLOBENZAPRINE HCL 10 MG PO TABS
10.0000 mg | ORAL_TABLET | Freq: Three times a day (TID) | ORAL | 1 refills | Status: DC | PRN
Start: 2022-02-06 — End: 2023-02-12

## 2022-02-06 MED ORDER — GLYCOPYRROLATE 0.2 MG/ML IJ SOLN
INTRAMUSCULAR | Status: DC | PRN
  Administered 2022-02-06: .2 mg via INTRAVENOUS

## 2022-02-06 MED ORDER — PHENYLEPHRINE HCL (PRESSORS) 10 MG/ML IV SOLN
INTRAVENOUS | Status: DC | PRN
  Administered 2022-02-06: 80 ug via INTRAVENOUS

## 2022-02-06 MED ORDER — MIDAZOLAM HCL 2 MG/2ML IJ SOLN
1.0000 mg | INTRAMUSCULAR | Status: DC
  Filled 2022-02-06: qty 2

## 2022-02-06 MED ORDER — LACTATED RINGERS IV BOLUS
500.0000 mL | Freq: Once | INTRAVENOUS | Status: AC
  Administered 2022-02-06: 500 mL via INTRAVENOUS

## 2022-02-06 MED ORDER — ONDANSETRON HCL 4 MG/2ML IJ SOLN
INTRAMUSCULAR | Status: DC | PRN
  Administered 2022-02-06: 4 mg via INTRAVENOUS

## 2022-02-06 MED ORDER — OXYCODONE-ACETAMINOPHEN 5-325 MG PO TABS: 1.0000 | ORAL_TABLET | ORAL | 0 refills | Status: DC | PRN

## 2022-02-06 MED ORDER — FENTANYL CITRATE (PF) 100 MCG/2ML IJ SOLN
INTRAMUSCULAR | Status: AC
  Filled 2022-02-06: qty 2

## 2022-02-06 MED ORDER — PROPOFOL 10 MG/ML IV BOLUS
INTRAVENOUS | Status: DC | PRN
  Administered 2022-02-06: 140 mg via INTRAVENOUS

## 2022-02-06 MED ORDER — STERILE WATER FOR IRRIGATION IR SOLN
Status: DC | PRN
  Administered 2022-02-06: 2000 mL

## 2022-02-06 MED ORDER — BUPIVACAINE HCL (PF) 0.5 % IJ SOLN
INTRAMUSCULAR | Status: DC | PRN
  Administered 2022-02-06: 15 mL via PERINEURAL

## 2022-02-06 MED ORDER — ACETAMINOPHEN 10 MG/ML IV SOLN: 1000.0000 mg | Freq: Once | INTRAVENOUS | Status: DC | PRN

## 2022-02-06 MED ORDER — FENTANYL CITRATE PF 50 MCG/ML IJ SOSY
50.0000 ug | PREFILLED_SYRINGE | INTRAMUSCULAR | Status: DC
  Administered 2022-02-06: 50 ug via INTRAVENOUS
  Filled 2022-02-06: qty 2

## 2022-02-06 MED ORDER — LACTATED RINGERS IV BOLUS
250.0000 mL | Freq: Once | INTRAVENOUS | Status: AC
  Administered 2022-02-06: 250 mL via INTRAVENOUS

## 2022-02-06 MED ORDER — CHLORHEXIDINE GLUCONATE 0.12 % MT SOLN
15.0000 mL | Freq: Once | OROMUCOSAL | Status: AC
  Administered 2022-02-06: 15 mL via OROMUCOSAL

## 2022-02-06 MED ORDER — PHENYLEPHRINE 80 MCG/ML (10ML) SYRINGE FOR IV PUSH (FOR BLOOD PRESSURE SUPPORT)
PREFILLED_SYRINGE | INTRAVENOUS | Status: AC
  Filled 2022-02-06: qty 10

## 2022-02-06 MED ORDER — LACTATED RINGERS IV SOLN: INTRAVENOUS | Status: DC

## 2022-02-06 MED ORDER — BUPIVACAINE LIPOSOME 1.3 % IJ SUSP
INTRAMUSCULAR | Status: DC | PRN
  Administered 2022-02-06: 10 mL via PERINEURAL

## 2022-02-06 MED ORDER — DEXAMETHASONE SODIUM PHOSPHATE 10 MG/ML IJ SOLN
INTRAMUSCULAR | Status: AC
  Filled 2022-02-06: qty 1

## 2022-02-06 MED ORDER — ROCURONIUM BROMIDE 100 MG/10ML IV SOLN
INTRAVENOUS | Status: DC | PRN
  Administered 2022-02-06: 50 mg via INTRAVENOUS

## 2022-02-06 MED ORDER — LIDOCAINE HCL (PF) 2 % IJ SOLN
INTRAMUSCULAR | Status: AC
  Filled 2022-02-06: qty 5

## 2022-02-06 MED ORDER — CEFAZOLIN SODIUM-DEXTROSE 2-4 GM/100ML-% IV SOLN
2.0000 g | INTRAVENOUS | Status: AC
  Administered 2022-02-06: 2 g via INTRAVENOUS
  Filled 2022-02-06: qty 100

## 2022-02-06 MED ORDER — FENTANYL CITRATE PF 50 MCG/ML IJ SOSY: 25.0000 ug | PREFILLED_SYRINGE | INTRAMUSCULAR | Status: DC | PRN

## 2022-02-06 MED ORDER — ONDANSETRON HCL 4 MG PO TABS: 4.0000 mg | ORAL_TABLET | Freq: Three times a day (TID) | ORAL | 0 refills | Status: DC | PRN

## 2022-02-06 MED ORDER — SUGAMMADEX SODIUM 500 MG/5ML IV SOLN
INTRAVENOUS | Status: DC | PRN
  Administered 2022-02-06: 200 mg via INTRAVENOUS

## 2022-02-06 MED ORDER — LIDOCAINE HCL (CARDIAC) PF 100 MG/5ML IV SOSY
PREFILLED_SYRINGE | INTRAVENOUS | Status: DC | PRN
  Administered 2022-02-06: 40 mg via INTRAVENOUS

## 2022-02-06 SURGICAL SUPPLY — 76 items
ADH SKN CLS APL DERMABOND .7 (GAUZE/BANDAGES/DRESSINGS) ×1
AID PSTN UNV HD RSTRNT DISP (MISCELLANEOUS) ×1
BAG COUNTER SPONGE SURGICOUNT (BAG) IMPLANT
BAG SPEC THK2 15X12 ZIP CLS (MISCELLANEOUS) ×1
BAG SPNG CNTER NS LX DISP (BAG)
BAG ZIPLOCK 12X15 (MISCELLANEOUS) ×3 IMPLANT
BLADE SAW SGTL 83.5X18.5 (BLADE) ×3 IMPLANT
BNDG COHESIVE 4X5 TAN ST LF (GAUZE/BANDAGES/DRESSINGS) ×3 IMPLANT
BSPLAT GLND +2X24 MDLR (Joint) ×1 IMPLANT
COOLER ICEMAN CLASSIC (MISCELLANEOUS) ×3 IMPLANT
COVER BACK TABLE 60X90IN (DRAPES) ×3 IMPLANT
COVER SURGICAL LIGHT HANDLE (MISCELLANEOUS) ×3 IMPLANT
CUP SUT UNIV REVERS 36 NEUTRAL (Cup) ×1 IMPLANT
DERMABOND ADVANCED (GAUZE/BANDAGES/DRESSINGS) ×1
DERMABOND ADVANCED .7 DNX12 (GAUZE/BANDAGES/DRESSINGS) ×2 IMPLANT
DRAPE INCISE IOBAN 66X45 STRL (DRAPES) ×1 IMPLANT
DRAPE ORTHO SPLIT 77X108 STRL (DRAPES) ×4
DRAPE SHEET LG 3/4 BI-LAMINATE (DRAPES) ×3 IMPLANT
DRAPE SURG 17X11 SM STRL (DRAPES) ×3 IMPLANT
DRAPE SURG ORHT 6 SPLT 77X108 (DRAPES) ×4 IMPLANT
DRAPE TOP 10253 STERILE (DRAPES) ×3 IMPLANT
DRAPE U-SHAPE 47X51 STRL (DRAPES) ×3 IMPLANT
DRESSING AQUACEL AG SP 3.5X6 (GAUZE/BANDAGES/DRESSINGS) ×2 IMPLANT
DRSG AQUACEL AG ADV 3.5X10 (GAUZE/BANDAGES/DRESSINGS) IMPLANT
DRSG AQUACEL AG SP 3.5X6 (GAUZE/BANDAGES/DRESSINGS) ×2
DRSG TEGADERM 8X12 (GAUZE/BANDAGES/DRESSINGS) ×3 IMPLANT
DURAPREP 26ML APPLICATOR (WOUND CARE) ×3 IMPLANT
ELECT BLADE TIP CTD 4 INCH (ELECTRODE) ×3 IMPLANT
ELECT PENCIL ROCKER SW 15FT (MISCELLANEOUS) ×3 IMPLANT
ELECT REM PT RETURN 15FT ADLT (MISCELLANEOUS) ×3 IMPLANT
FACESHIELD WRAPAROUND (MASK) ×8 IMPLANT
FACESHIELD WRAPAROUND OR TEAM (MASK) ×8 IMPLANT
GLENOID UNI REV MOD 24 +2 LAT (Joint) ×1 IMPLANT
GLENOSPHERE 36 +4 LAT/24 (Joint) ×1 IMPLANT
GLOVE BIO SURGEON STRL SZ7.5 (GLOVE) ×3 IMPLANT
GLOVE BIO SURGEON STRL SZ8 (GLOVE) ×3 IMPLANT
GLOVE SS BIOGEL STRL SZ 7 (GLOVE) ×2 IMPLANT
GLOVE SS BIOGEL STRL SZ 7.5 (GLOVE) ×2 IMPLANT
GLOVE SUPERSENSE BIOGEL SZ 7 (GLOVE) ×1
GLOVE SUPERSENSE BIOGEL SZ 7.5 (GLOVE) ×1
GOWN STRL REIN XL XLG (GOWN DISPOSABLE) ×6 IMPLANT
INSERT HUMERAL UNI REVERS 36 3 (Insert) ×1 IMPLANT
KIT BASIN OR (CUSTOM PROCEDURE TRAY) ×3 IMPLANT
KIT TURNOVER KIT A (KITS) ×1 IMPLANT
MANIFOLD NEPTUNE II (INSTRUMENTS) ×3 IMPLANT
NDL TAPERED W/ NITINOL LOOP (MISCELLANEOUS) ×2 IMPLANT
NEEDLE TAPERED W/ NITINOL LOOP (MISCELLANEOUS) ×2 IMPLANT
NS IRRIG 1000ML POUR BTL (IV SOLUTION) ×3 IMPLANT
PACK SHOULDER (CUSTOM PROCEDURE TRAY) ×3 IMPLANT
PAD ARMBOARD 7.5X6 YLW CONV (MISCELLANEOUS) ×3 IMPLANT
PAD COLD SHLDR WRAP-ON (PAD) ×3 IMPLANT
PIN NITINOL TARGETER 2.8 (PIN) IMPLANT
PIN SET MODULAR GLENOID SYSTEM (PIN) ×1 IMPLANT
RESTRAINT HEAD UNIVERSAL NS (MISCELLANEOUS) ×3 IMPLANT
SCREW CENTRAL MODULAR 25 (Screw) ×1 IMPLANT
SCREW PERI LOCK 5.5X16 (Screw) ×2 IMPLANT
SCREW PERI LOCK 5.5X32 (Screw) ×1 IMPLANT
SCREW PERI LOCK 5.5X36 (Screw) ×1 IMPLANT
SLING ARM FOAM STRAP LRG (SOFTGOODS) ×1 IMPLANT
SLING ARM FOAM STRAP MED (SOFTGOODS) IMPLANT
SPONGE T-LAP 4X18 ~~LOC~~+RFID (SPONGE) ×3 IMPLANT
STEM HUMERAL MOD SZ 5 135 DEG (Stem) ×1 IMPLANT
SUCTION FRAZIER HANDLE 12FR (TUBING) ×2
SUCTION TUBE FRAZIER 12FR DISP (TUBING) ×2 IMPLANT
SUT FIBERWIRE #2 38 T-5 BLUE (SUTURE)
SUT MNCRL AB 3-0 PS2 18 (SUTURE) ×3 IMPLANT
SUT MON AB 2-0 CT1 36 (SUTURE) ×3 IMPLANT
SUT VIC AB 1 CT1 36 (SUTURE) ×3 IMPLANT
SUTURE FIBERWR #2 38 T-5 BLUE (SUTURE) IMPLANT
SUTURE TAPE 1.3 40 TPR END (SUTURE) ×4 IMPLANT
SUTURETAPE 1.3 40 TPR END (SUTURE) ×4
TOWEL OR 17X26 10 PK STRL BLUE (TOWEL DISPOSABLE) ×3 IMPLANT
TOWEL OR NON WOVEN STRL DISP B (DISPOSABLE) ×3 IMPLANT
TUBE SUCTION HIGH CAP CLEAR NV (SUCTIONS) ×3 IMPLANT
WATER STERILE IRR 1000ML POUR (IV SOLUTION) ×6 IMPLANT
YANKAUER SUCT BULB TIP 10FT TU (MISCELLANEOUS) ×1 IMPLANT

## 2022-02-06 NOTE — Transfer of Care (Signed)
Immediate Anesthesia Transfer of Care Note  Patient: Debra Mcconnell  Procedure(s) Performed: REVERSE SHOULDER ARTHROPLASTY (Left: Shoulder)  Patient Location: PACU  Anesthesia Type:General  Level of Consciousness: awake, alert , oriented and patient cooperative  Airway & Oxygen Therapy: Patient Spontanous Breathing and Patient connected to face mask oxygen  Post-op Assessment: Report given to RN and Post -op Vital signs reviewed and stable  Post vital signs: Reviewed and stable  Last Vitals:  Vitals Value Taken Time  BP 160/79 02/06/22 1423  Temp    Pulse 61 02/06/22 1426  Resp 15 02/06/22 1426  SpO2 99 % 02/06/22 1426  Vitals shown include unvalidated device data.  Last Pain:  Vitals:   02/06/22 1205  PainSc: 0-No pain      Patients Stated Pain Goal: 4 (33/35/45 6256)  Complications: No notable events documented.

## 2022-02-06 NOTE — Evaluation (Signed)
Occupational Therapy Evaluation Patient Details Name: Debra Mcconnell MRN: [id number] DOB: 1946/06/07 Today's Date: 02/06/2022   History of Present Illness Patient s/p reverse L TSA   Clinical Impression   Mrs. Debra Mcconnell is a 76 year old woman s/p shoulder replacement without functional use of left non-dominant upper extremity secondary to effects of surgery and interscalene block and shoulder precautions. At baseline patient has assistance with some ADLs but is typically able to ambulate in home with rollator and perform toileting. Therapist provided education and instruction to patient, caregiver and friend in regards to exercises, precautions, positioning, donning upper extremity clothing and bathing while maintaining shoulder precautions, ice and edema management and donning/doffing sling. Handouts were provided to maximize retention of education. Patient needed assistance to donn shirt, underwear, pants, socks and shoes/. Patient significant difficulties with mobility - needing min-mod assist to power up from seated position and only able to take a couple steps to pivot to wheelchair, toilet and vice versa with very specific handholds. Provided instruction on compensatory strategies and home environment. Patient will be reliant on wheelchair for mobility in home due to NWB status of LUE. Patient's caregiver will be able to be there 24/7 for "a couple of weeks." Patient will have impaired mobility during recovery of UE - so therapist suggests [facility].  Patient to follow up with MD to progress shoulder rehab.       Recommendations for follow up therapy are one component of a multi-disciplinary discharge planning process, led by the attending physician.  Recommendations may be updated based on patient status, additional functional criteria and insurance authorization.   Follow Up Recommendations  Follow physician's recommendations for discharge plan and follow up therapies    Assistance  Recommended at Discharge Frequent or constant Supervision/Assistance  Patient can return home with the following A lot of help with walking and/or transfers;A lot of help with bathing/dressing/bathroom;Assistance with cooking/housework;Help with stairs or ramp for entrance    Functional Status Assessment  Patient has had a recent decline in their functional status and demonstrates the ability to make significant improvements in function in a reasonable and predictable amount of time.  Equipment Recommendations  None recommended by OT    Recommendations for Other Services       Precautions / Restrictions Precautions Precautions: Shoulder Type of Shoulder Precautions: If sitting in controlled environment, ok to come out of sling to give neck a break. Please sleep in it to protect until follow up in office.     OK to use operative arm for feeding, hygiene and ADLs.   Ok to instruct Pendulums and lap slides as exercises. Ok to use operative arm within the following parameters for ADL purposes     New ROM (8/18)   Ok for PROM, AAROM, AROM within pain tolerance and within the following ROM   ER 20   ABD 45   FE 60 Shoulder Interventions: Shoulder sling/immobilizer;Off for dressing/bathing/exercises Precaution Booklet Issued:  (handouts) Required Braces or Orthoses: Sling Restrictions Weight Bearing Restrictions: Yes LUE Weight Bearing: Non weight bearing      Mobility Bed Mobility                    Transfers                          Balance Overall balance assessment: Needs assistance Sitting-balance support: No upper extremity supported, Feet supported Sitting balance-Leahy Scale: Good     Standing  balance support: Single extremity supported Standing balance-Leahy Scale: Poor                             ADL either performed or assessed with clinical judgement   ADL Overall ADL's : Needs assistance/impaired Eating/Feeding: Set up;Sitting    Grooming: Set up;Sitting   Upper Body Bathing: Moderate assistance;Sitting   Lower Body Bathing: Maximal assistance;Sit to/from stand   Upper Body Dressing : Maximal assistance;Sitting   Lower Body Dressing: Total assistance;Sit to/from stand   Toilet Transfer: Minimal assistance Toilet Transfer Details (indicate cue type and reason): used wheelchair to get in to bathroom. Verbal cues for hand hold placements on grab bar - overall min assist physically. Toileting- Clothing Manipulation and Hygiene: Sit to/from stand;Maximal assistance Toileting - Clothing Manipulation Details (indicate cue type and reason): requires assistance for clothing management     Functional mobility during ADLs: Minimal assistance;Wheelchair       Vision Baseline Vision/History: 1 Wears glasses       Perception     Praxis      Pertinent Vitals/Pain Pain Assessment Pain Assessment: No/denies pain     Hand Dominance Right   Extremity/Trunk Assessment Upper Extremity Assessment Upper Extremity Assessment: LUE deficits/detail LUE Deficits / Details: impaired sensation and motor control secondary to block   Lower Extremity Assessment Lower Extremity Assessment: Overall WFL for tasks assessed   Cervical / Trunk Assessment Cervical / Trunk Assessment: Kyphotic   Communication Communication Communication: HOH   Cognition Arousal/Alertness: Awake/alert Behavior During Therapy: WFL for tasks assessed/performed Overall Cognitive Status: Within Functional Limits for tasks assessed                                       General Comments       Exercises     Shoulder Instructions      Home Living Family/patient expects to be discharged to:: Private residence Living Arrangements: Alone Available Help at Discharge: Personal care attendant (has 24/7 assist for the next couple weeks) Type of Home: House Home Access: Ramped entrance     Home Layout: One level     Bathroom  Shower/Tub: Occupational psychologist: Standard     Home Equipment: Rollator (4 wheels);BSC/3in1;Shower seat;Grab bars - toilet;Grab bars - tub/shower;Wheelchair - manual   Additional Comments: lift chair, hemiwalker      Prior Functioning/Environment Prior Level of Function : Needs assist             Mobility Comments: uses rollator - sometimes has assistance for sit to stand from low surface ADLs Comments: Has assistance for socks and shoes, min assist for clothing        OT Problem List: Decreased strength;Decreased range of motion;Impaired balance (sitting and/or standing);Obesity;Impaired UE functional use;Decreased knowledge of use of DME or AE;Decreased knowledge of precautions      OT Treatment/Interventions:      OT Goals(Current goals can be found in the care plan section) Acute Rehab OT Goals OT Goal Formulation: All assessment and education complete, DC therapy  OT Frequency:      Co-evaluation              AM-PAC OT "6 Clicks" Daily Activity     Outcome Measure Help from another person eating meals?: A Little Help from another person taking care of personal grooming?: A Little Help  from another person toileting, which includes using toliet, bedpan, or urinal?: A Lot Help from another person bathing (including washing, rinsing, drying)?: A Lot Help from another person to put on and taking off regular upper body clothing?: A Lot Help from another person to put on and taking off regular lower body clothing?: Total 6 Click Score: 13   End of Session Equipment Utilized During Treatment: Gait belt Nurse Communication: Mobility status (OT education complete)  Activity Tolerance: Patient tolerated treatment well Patient left: in chair;with family/visitor present  OT Visit Diagnosis: Pain;Muscle weakness (generalized) (M62.81)                Time: 1600-1711 OT Time Calculation (min): 71 min Charges:  OT General Charges $OT Visit: 1 Visit OT  Evaluation $OT Eval Low Complexity: 1 Low OT Treatments $Self Care/Home Management : 53-67 mins  Donyale Berthold, OTR/L [place] [phone] Pager: [place] 02/06/2022, 5:28 PM

## 2022-02-06 NOTE — Anesthesia Procedure Notes (Signed)
Anesthesia Regional Block: Interscalene brachial plexus block   Pre-Anesthetic Checklist: , timeout performed,  Correct Patient, Correct Site, Correct Laterality,  Correct Procedure, Correct Position, site marked,  Risks and benefits discussed,  Surgical consent,  Pre-op evaluation,  At surgeon's request and post-op pain management  Laterality: Left  Prep: Dura Prep       Needles:  Injection technique: Single-shot  Needle Type: Echogenic Stimulator Needle     Needle Length: 5cm  Needle Gauge: 20     Additional Needles:   Procedures:,,,, ultrasound used (permanent image in chart),,    Narrative:  Start time: 02/06/2022 11:50 AM End time: 02/06/2022 11:55 AM Injection made incrementally with aspirations every 5 mL.  Performed by: Personally  Anesthesiologist: Darral Dash, DO  Additional Notes: Patient identified. Risks/Benefits/Options discussed with patient including but not limited to bleeding, infection, nerve damage, failed block, incomplete pain control. Patient expressed understanding and wished to proceed. All questions were answered. Sterile technique was used throughout the entire procedure. Please see nursing notes for vital signs. Aspirated in 5cc intervals with injection for negative confirmation. Patient was given instructions on fall risk and not to get out of bed. All questions and concerns addressed with instructions to call with any issues or inadequate analgesia.

## 2022-02-06 NOTE — Progress Notes (Signed)
Assisted Dr. Jana Half with left, interscalene , ultrasound guided Melvin Whiteford. Side rails up, monitors on throughout procedure. See vital signs in flow sheet. Tolerated Procedure well.

## 2022-02-06 NOTE — H&P (Signed)
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 female with chronic and progressively increasing left shoulder pain related to severe osteoarthritis with associated malunion from previous impacted proximal humerus fracture.  Due to her increasing pain and functional limitations and failure to respond to prolonged attempts at conservative management, she is brought to the operating room at this time for planned left shoulder reverse arthroplasty.  Past Medical History:  Diagnosis Date   Acquired hammer toes of both feet 01/22/2017   Arthritis 11/29/2013   Atrial flutter ([place]) 01/24/2014   Overview:  CHADS2 vasc score= 5   B-complex deficiency 11/29/2013   Cerebral artery occlusion with cerebral infarction ([place]) 12/04/2013   Overview:  STORY: MRI +ve Left frontoparietal infarction (LMCA). +AFib.`E1o3L`IMPRESSION: BP is under controlled. Continue home health. +AFib, con't Eliquis 5m bid. [facility]   Chronic kidney disease    Chronic systolic (congestive) heart failure (HCC)    Diabetic polyneuropathy associated with type 2 diabetes mellitus ([facility] 01/22/2017   Essential hypertension 11/13/2015   Hyperlipemia, retention 11/13/2015   Hypothyroidism    OSA (obstructive sleep apnea) 01/24/2014   CPAP   Pre-ulcerative corn or callous 01/22/2017   [place] 01/24/2014   has some expressive aphasia   Thyroid disease 11/29/2013   Type II diabetes mellitus with ophthalmic manifestations ([facility] 11/29/2013      Past Surgical History:  Procedure Laterality Date   APPENDECTOMY     CHOLECYSTECTOMY  2009   FRACTURE SURGERY  1954   arm   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 She has never used smokeless tobacco. She reports that she does not drink alcohol and does not use drugs.  BMI: Estimated body mass index is 31.48 kg/m as calculated from the following:   Height as of this encounter: 5' 7" (1.702 m).   Weight as of this encounter: 91.2 kg.  Lab Results  Component Value Date   ALBUMIN 2.7 (L) 10/07/2019   Diabetes:   Patient has a diagnosis of diabetes,  Lab Results  Component Value Date   HGBA1C 7.0 (H) 01/28/2022   Smoking Status: Social History   Tobacco Use  Smoking Status Former  Smokeless Tobacco Never   The patient is not currently a tobacco user. Counseling given: Not Answered      Medications Prior to Admission  Medication Sig Dispense Refill   ACCU-CHEK GUIDE test strip USE TO CHECK GLUCOSE     alendronate (FOSAMAX) 70 MG tablet Take 70 mg by mouth every Sunday.     atorvastatin (LIPITOR) 40 MG tablet Take 40 mg by mouth at bedtime.     BD PEN NEEDLE NANO 2ND GEN 32G X 4 MM MISC USE WITH PEN AS DIRECTED.     Blood Glucose Monitoring Suppl (ACCU-CHEK GUIDE) w/Device KIT USE TO CHECK BLOOD SUGAR DAILY     buPROPion (WELLBUTRIN XL) 300 MG 24 hr tablet Take 300 mg by mouth daily.     cadexomer iodine (IODOSORB) 0.9 % gel Apply 1 application. topically daily as needed for wound care. 40 g 0   carvedilol (  COREG) 25 MG tablet Take 25 mg by mouth 2 (two) times daily with a meal.     Cholecalciferol (VITAMIN D) 2000 units CAPS Take 4,000 Units by mouth daily.     dapagliflozin propanediol (FARXIGA) 10 MG TABS tablet Take 1 tablet (10 mg total) by mouth daily before breakfast. 90 tablet 3   ELIQUIS 5 MG TABS tablet TAKE 1 TABLET TWICE A DAY 180 tablet 1   Exenatide ER 2 MG SRER Inject 2 mg into the skin every Sunday.     ferrous sulfate 325 (65 FE) MG EC tablet Take 325 mg by mouth 2 (two) times daily with a meal.     Fluticasone Propionate (ALLERGY RELIEF NA) Place 1 spray into the nose daily as needed  (allergies).     furosemide (LASIX) 20 MG tablet Take 20-40 mg by mouth See admin instructions. Take 2 tablets (40 mg) in am and 1 tablet (20 mg) at noon     gabapentin (NEURONTIN) 300 MG capsule Take 300-1,200 mg by mouth See admin instructions. Take 300 mg in the morning and 300 mg at noon, and 4 capsule (1230m) at bedtime     hydrALAZINE (APRESOLINE) 25 MG tablet Take 25 mg by mouth 3 (three) times daily.     ketoconazole (NIZORAL) 2 % cream Apply 1 application. topically daily as needed for irritation.     Lancets (ONETOUCH DELICA PLUS [id number] MISC Apply 1 each topically daily.     levothyroxine (SYNTHROID, LEVOTHROID) 175 MCG tablet Take 175 mcg by mouth daily.     potassium chloride (KLOR-CON) 10 MEQ tablet Take 15 mEq by mouth daily at 12 noon.     PSYLLIUM HUSK PO Take 1 tablet by mouth daily.     sacubitril-valsartan (ENTRESTO) 49-51 MG Take 1 tablet by mouth 2 (two) times daily.     silver sulfADIAZINE (SILVADENE) 1 % cream Apply pea-sized amount to wound daily. (Patient taking differently: Apply 1 application. topically daily as needed (wound care). Apply pea-sized amount to wound daily.) 50 g 0   vitamin B-12 (CYANOCOBALAMIN) 1000 MCG tablet Take 1,000 mcg by mouth daily.     zolpidem (AMBIEN) 5 MG tablet Take 5 mg by mouth at bedtime as needed for sleep.       Physical Exam: Left shoulder demonstrates profoundly restricted motion with severe pain throughout her range.  She has global weakness to manual muscle testing.  She additionally is noted to have severe cubitus varus of the elbow related to a childhood fracture which is healed in a malunited position.  Otherwise grossly neurovascular intact as noted at recent office visit.  Plain radiographs confirm severe arthritis with complete obliteration of the joint space, subchondral sclerosis, and peripheral osteophyte formation.  There is some mild posterior subluxation of the humeral head in addition evidence for varus deformity  related to previous impacted proximal humeral fracture which appears to be solidly united.  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

## 2022-02-06 NOTE — Anesthesia Procedure Notes (Signed)
Procedure Name: Intubation Date/Time: 02/06/2022 12:48 PM Performed by: Jonna Munro, CRNA Pre-anesthesia Checklist: Patient identified, Emergency Drugs available, Suction available, Patient being monitored and Timeout performed Patient Re-evaluated:Patient Re-evaluated prior to induction Oxygen Delivery Method: Circle system utilized Preoxygenation: Pre-oxygenation with 100% oxygen Induction Type: IV induction Ventilation: Mask ventilation without difficulty Laryngoscope Size: Mac and 3 Grade View: Grade I Tube type: Oral Tube size: 7.0 mm Number of attempts: 1 Airway Equipment and Method: Stylet Placement Confirmation: ETT inserted through vocal cords under direct vision, positive ETCO2 and breath sounds checked- equal and bilateral Secured at: 22 cm Tube secured with: Tape Dental Injury: Teeth and Oropharynx as per pre-operative assessment

## 2022-02-06 NOTE — Discharge Instructions (Signed)

## 2022-02-06 NOTE — Anesthesia Postprocedure Evaluation (Signed)
Anesthesia Post Note  Patient: Debra Mcconnell  Procedure(s) Performed: REVERSE SHOULDER ARTHROPLASTY (Left: Shoulder)     Patient location during evaluation: PACU Anesthesia Type: Regional and General Level of consciousness: awake and alert Pain management: pain level controlled Vital Signs Assessment: post-procedure vital signs reviewed and stable Respiratory status: spontaneous breathing, nonlabored ventilation, respiratory function stable and patient connected to nasal cannula oxygen Cardiovascular status: blood pressure returned to baseline and stable Postop Assessment: no apparent nausea or vomiting Anesthetic complications: no   No notable events documented.  Last Vitals:  Vitals:   02/06/22 1500 02/06/22 1515  BP: (!) 149/94 (!) 146/73  Pulse: 65 69  Resp: 12 12  Temp: (!) 36.4 C (!) 36.4 C  SpO2: 94% 94%    Last Pain:  Vitals:   02/06/22 1515  PainSc: 0-No pain                 Belenda Cruise P Alda Gaultney

## 2022-02-06 NOTE — Op Note (Signed)
02/06/2022  2:00 PM  PATIENT:   Debra Mcconnell  76 y.o. 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  ASSISTANTS: Jenetta Loges, PA-C  ANESTHESIA:   General endotracheal and interscalene block with Exparel  EBL: 150 cc  SPECIMEN: None  Drains: None   PATIENT DISPOSITION:  PACU - hemodynamically stable.    PLAN OF CARE: Discharge to home after PACU  Brief history:  Patient is a 76 year old female with an extensive and complex past medical history with a number of significant underlying medical comorbidities who presents with severe left shoulder pain and profoundly restricted motion secondary to osteoarthritis as well as proximal humeral malunion and rotator cuff dysfunction.  Due to her severely restricted mobility and function increasing pain which is significantly impacting her quality of life and ability to perform activities of daily living, she is brought to the operating room at this time for planned left shoulder reverse arthroplasty.  Preoperatively, I counseled the patient regarding treatment options and risks versus benefits thereof.  Possible surgical complications were all reviewed including potential for bleeding, infection, neurovascular injury, persistent pain, loss of motion, anesthetic complication, failure of the implant, and possible need for additional surgery. They understand and accept and agrees with our planned procedure.   Procedure detail:  After undergoing routine preop evaluation the patient received prophylactic antibiotics and interscalene block with Exparel was established in the holding area by the anesthesia department.  Patient  subsequently placed spine on the operating table and underwent the smooth induction of a general endotracheal anesthesia.  Placed into the beachchair position and appropriately padded and protected.  The left shoulder girdle region was sterilely prepped and draped in standard fashion.  Timeout was called.  A deltopectoral approach to the left shoulder is made an approximate 10 cm incision.  Skin flaps were elevated dissection carried deeply the deltopectoral interval was developed from proximal to distal with the vein taken laterally.  The long head biceps tendon was then tenodesed at the upper border the pectoralis major tendon the proximal segment unroofed and excised.  The shoulder had essentially no passive motion and was ankylosed due to the severe bony deformity.  The subscapularis was separated from the lesser tuberosity and allowed to retract medially.  Capsular attachments were then divided from the anterior and inferior margins of the humeral neck and the rotator cuff superiorly was also recessed to allow dislocation of the joint deliver the humeral head through the wound.  The head was markedly deformed and completely flattened with large peripheral osteophytes.  In addition there was significant varus deformity.  The extra medullary guide was then used to perform our humeral head resection with an oscillating saw at approximate 20 degrees of retroversion.  Large peripheral osteophytes were then removed with rondure.  A metal cap was placed over the cut proximal humeral surface and we then exposed the glenoid with appropriate retractors.  A circumferential labral resection was then completed.  Guidepin directed into the center of the glenoid with an approximately 10 degree inferior tilt and the glenoid was then reamed with the central followed by the peripheral reamers to a stable subchondral bony bed.  Preparation completed with the central drill and tapped for a 25 mm  lag screw.  Our baseplate was then  assembled and inserted with vancomycin powder applied to the threads of the lag screw and excellent purchase was achieved.  All of the peripheral locking screws were then placed using standard technique.  Excellent fixation was achieved.  A 36/+4 glenosphere was then impacted onto the baseplate and the central locking screw was placed.  We returned our attention to the proximal humerus where the canal was opened and we began broaching and the varus deformity made broaching difficult with little space available for the implant.  Ultimately we seated a size 5.5 modular stem broach and then performed metaphyseal reaming and again there was very minimal bone to be reamed due to the marked varus deformity from the previous fracture.  Trial reduction was then performed which showed good motion good stability good soft tissue balance.  At this point the trial was then removed.  We harvested bone graft from the resected humeral head and once the implant was assembled and vancomycin powder applied into the humeral canal the stem was placed in approximately halfway and then the bone graft was packed around the stem in the metaphyseal region and final seating was performed which achieved excellent purchase and fixation and approximately 20 degrees of retroversion.  Trial reduction showed good motion stability and soft tissue balance with the +3 poly-.  Given the absence of the subscapularis due to the marked adhesions and long-term disused utilized a constrained liner.  Ultimately a +3 constrained liner was impacted onto the implant and final reduction was performed showing good motion good stability and good soft tissue balance.  The wound was then copiously irrigated.  Final hemostasis was obtained.  Balance of the vancomycin powder was then spread liberally throughout the deep soft tissue layers.  The deltopectoral interval was reapproximated with a series of figure-of-eight number Vicryl sutures.  2-0 Monocryl used to  close the subcu layer and intracuticular 3-0 Monocryl for the skin followed by Dermabond and Aquacel dressing.  Left arm was then placed into a sling and the patient was awakened, extubated, and taken to the recovery room in stable condition.  Jenetta Loges, PA-C was utilized as an Environmental consultant throughout this case, essential for help with positioning the patient, positioning extremity, tissue manipulation, implantation of the prosthesis, suture management, wound closure, and intraoperative decision-making.  Marin Shutter MD   Contact # [phone]

## 2022-02-07 ENCOUNTER — Encounter (HOSPITAL_COMMUNITY): Payer: Self-pay | Admitting: Orthopedic Surgery

## 2022-02-13 ENCOUNTER — Other Ambulatory Visit: Payer: Self-pay

## 2022-02-13 MED ORDER — DAPAGLIFLOZIN PROPANEDIOL 10 MG PO TABS: 10.0000 mg | ORAL_TABLET | Freq: Every day | ORAL | 2 refills | Status: DC

## 2022-02-17 DIAGNOSIS — Z96612 Presence of left artificial shoulder joint: Secondary | ICD-10-CM

## 2022-02-17 DIAGNOSIS — Z4789 Encounter for other orthopedic aftercare: Secondary | ICD-10-CM | POA: Diagnosis not present

## 2022-02-17 HISTORY — DX: Presence of left artificial shoulder joint: Z96.612

## 2022-02-20 DIAGNOSIS — E1142 Type 2 diabetes mellitus with diabetic polyneuropathy: Secondary | ICD-10-CM | POA: Diagnosis not present

## 2022-02-20 DIAGNOSIS — Z9049 Acquired absence of other specified parts of digestive tract: Secondary | ICD-10-CM | POA: Diagnosis not present

## 2022-02-20 DIAGNOSIS — I4892 Unspecified atrial flutter: Secondary | ICD-10-CM | POA: Diagnosis not present

## 2022-02-20 DIAGNOSIS — I130 Hypertensive heart and chronic kidney disease with heart failure and stage 1 through stage 4 chronic kidney disease, or unspecified chronic kidney disease: Secondary | ICD-10-CM | POA: Diagnosis not present

## 2022-02-20 DIAGNOSIS — Z7984 Long term (current) use of oral hypoglycemic drugs: Secondary | ICD-10-CM | POA: Diagnosis not present

## 2022-02-20 DIAGNOSIS — Z8673 Personal history of transient ischemic attack (TIA), and cerebral infarction without residual deficits: Secondary | ICD-10-CM | POA: Diagnosis not present

## 2022-02-20 DIAGNOSIS — R32 Unspecified urinary incontinence: Secondary | ICD-10-CM | POA: Diagnosis not present

## 2022-02-20 DIAGNOSIS — E785 Hyperlipidemia, unspecified: Secondary | ICD-10-CM | POA: Diagnosis not present

## 2022-02-20 DIAGNOSIS — N189 Chronic kidney disease, unspecified: Secondary | ICD-10-CM | POA: Diagnosis not present

## 2022-02-20 DIAGNOSIS — Z7901 Long term (current) use of anticoagulants: Secondary | ICD-10-CM | POA: Diagnosis not present

## 2022-02-20 DIAGNOSIS — Z96659 Presence of unspecified artificial knee joint: Secondary | ICD-10-CM | POA: Diagnosis not present

## 2022-02-20 DIAGNOSIS — G4733 Obstructive sleep apnea (adult) (pediatric): Secondary | ICD-10-CM | POA: Diagnosis not present

## 2022-02-20 DIAGNOSIS — I5022 Chronic systolic (congestive) heart failure: Secondary | ICD-10-CM | POA: Diagnosis not present

## 2022-02-20 DIAGNOSIS — Z9181 History of falling: Secondary | ICD-10-CM | POA: Diagnosis not present

## 2022-02-20 DIAGNOSIS — Z471 Aftercare following joint replacement surgery: Secondary | ICD-10-CM | POA: Diagnosis not present

## 2022-02-20 DIAGNOSIS — Z96612 Presence of left artificial shoulder joint: Secondary | ICD-10-CM | POA: Diagnosis not present

## 2022-02-20 DIAGNOSIS — E039 Hypothyroidism, unspecified: Secondary | ICD-10-CM | POA: Diagnosis not present

## 2022-02-20 DIAGNOSIS — Z7985 Long-term (current) use of injectable non-insulin antidiabetic drugs: Secondary | ICD-10-CM | POA: Diagnosis not present

## 2022-02-22 DIAGNOSIS — E1142 Type 2 diabetes mellitus with diabetic polyneuropathy: Secondary | ICD-10-CM | POA: Diagnosis not present

## 2022-02-22 DIAGNOSIS — I4892 Unspecified atrial flutter: Secondary | ICD-10-CM | POA: Diagnosis not present

## 2022-02-22 DIAGNOSIS — I5022 Chronic systolic (congestive) heart failure: Secondary | ICD-10-CM | POA: Diagnosis not present

## 2022-02-22 DIAGNOSIS — Z471 Aftercare following joint replacement surgery: Secondary | ICD-10-CM | POA: Diagnosis not present

## 2022-02-22 DIAGNOSIS — N189 Chronic kidney disease, unspecified: Secondary | ICD-10-CM | POA: Diagnosis not present

## 2022-02-22 DIAGNOSIS — I130 Hypertensive heart and chronic kidney disease with heart failure and stage 1 through stage 4 chronic kidney disease, or unspecified chronic kidney disease: Secondary | ICD-10-CM | POA: Diagnosis not present

## 2022-02-24 DIAGNOSIS — I5022 Chronic systolic (congestive) heart failure: Secondary | ICD-10-CM | POA: Diagnosis not present

## 2022-02-24 DIAGNOSIS — I4892 Unspecified atrial flutter: Secondary | ICD-10-CM | POA: Diagnosis not present

## 2022-02-24 DIAGNOSIS — E1142 Type 2 diabetes mellitus with diabetic polyneuropathy: Secondary | ICD-10-CM | POA: Diagnosis not present

## 2022-02-24 DIAGNOSIS — N189 Chronic kidney disease, unspecified: Secondary | ICD-10-CM | POA: Diagnosis not present

## 2022-02-24 DIAGNOSIS — Z471 Aftercare following joint replacement surgery: Secondary | ICD-10-CM | POA: Diagnosis not present

## 2022-02-24 DIAGNOSIS — I130 Hypertensive heart and chronic kidney disease with heart failure and stage 1 through stage 4 chronic kidney disease, or unspecified chronic kidney disease: Secondary | ICD-10-CM | POA: Diagnosis not present

## 2022-02-25 DIAGNOSIS — L89302 Pressure ulcer of unspecified buttock, stage 2: Secondary | ICD-10-CM | POA: Diagnosis not present

## 2022-02-26 DIAGNOSIS — Z471 Aftercare following joint replacement surgery: Secondary | ICD-10-CM | POA: Diagnosis not present

## 2022-02-26 DIAGNOSIS — I4892 Unspecified atrial flutter: Secondary | ICD-10-CM | POA: Diagnosis not present

## 2022-02-26 DIAGNOSIS — E1142 Type 2 diabetes mellitus with diabetic polyneuropathy: Secondary | ICD-10-CM | POA: Diagnosis not present

## 2022-02-26 DIAGNOSIS — I130 Hypertensive heart and chronic kidney disease with heart failure and stage 1 through stage 4 chronic kidney disease, or unspecified chronic kidney disease: Secondary | ICD-10-CM | POA: Diagnosis not present

## 2022-02-26 DIAGNOSIS — N189 Chronic kidney disease, unspecified: Secondary | ICD-10-CM | POA: Diagnosis not present

## 2022-02-26 DIAGNOSIS — I5022 Chronic systolic (congestive) heart failure: Secondary | ICD-10-CM | POA: Diagnosis not present

## 2022-03-03 DIAGNOSIS — L89302 Pressure ulcer of unspecified buttock, stage 2: Secondary | ICD-10-CM | POA: Diagnosis not present

## 2022-03-04 DIAGNOSIS — E1142 Type 2 diabetes mellitus with diabetic polyneuropathy: Secondary | ICD-10-CM | POA: Diagnosis not present

## 2022-03-04 DIAGNOSIS — Z471 Aftercare following joint replacement surgery: Secondary | ICD-10-CM | POA: Diagnosis not present

## 2022-03-04 DIAGNOSIS — I130 Hypertensive heart and chronic kidney disease with heart failure and stage 1 through stage 4 chronic kidney disease, or unspecified chronic kidney disease: Secondary | ICD-10-CM | POA: Diagnosis not present

## 2022-03-04 DIAGNOSIS — N189 Chronic kidney disease, unspecified: Secondary | ICD-10-CM | POA: Diagnosis not present

## 2022-03-04 DIAGNOSIS — I4892 Unspecified atrial flutter: Secondary | ICD-10-CM | POA: Diagnosis not present

## 2022-03-04 DIAGNOSIS — I5022 Chronic systolic (congestive) heart failure: Secondary | ICD-10-CM | POA: Diagnosis not present

## 2022-03-06 DIAGNOSIS — N189 Chronic kidney disease, unspecified: Secondary | ICD-10-CM | POA: Diagnosis not present

## 2022-03-06 DIAGNOSIS — Z471 Aftercare following joint replacement surgery: Secondary | ICD-10-CM | POA: Diagnosis not present

## 2022-03-06 DIAGNOSIS — I5022 Chronic systolic (congestive) heart failure: Secondary | ICD-10-CM | POA: Diagnosis not present

## 2022-03-06 DIAGNOSIS — E1142 Type 2 diabetes mellitus with diabetic polyneuropathy: Secondary | ICD-10-CM | POA: Diagnosis not present

## 2022-03-06 DIAGNOSIS — I130 Hypertensive heart and chronic kidney disease with heart failure and stage 1 through stage 4 chronic kidney disease, or unspecified chronic kidney disease: Secondary | ICD-10-CM | POA: Diagnosis not present

## 2022-03-06 DIAGNOSIS — I4892 Unspecified atrial flutter: Secondary | ICD-10-CM | POA: Diagnosis not present

## 2022-03-10 DIAGNOSIS — N189 Chronic kidney disease, unspecified: Secondary | ICD-10-CM | POA: Diagnosis not present

## 2022-03-10 DIAGNOSIS — I130 Hypertensive heart and chronic kidney disease with heart failure and stage 1 through stage 4 chronic kidney disease, or unspecified chronic kidney disease: Secondary | ICD-10-CM | POA: Diagnosis not present

## 2022-03-10 DIAGNOSIS — Z471 Aftercare following joint replacement surgery: Secondary | ICD-10-CM | POA: Diagnosis not present

## 2022-03-10 DIAGNOSIS — I4892 Unspecified atrial flutter: Secondary | ICD-10-CM | POA: Diagnosis not present

## 2022-03-10 DIAGNOSIS — I5022 Chronic systolic (congestive) heart failure: Secondary | ICD-10-CM | POA: Diagnosis not present

## 2022-03-10 DIAGNOSIS — E1142 Type 2 diabetes mellitus with diabetic polyneuropathy: Secondary | ICD-10-CM | POA: Diagnosis not present

## 2022-03-12 DIAGNOSIS — I5022 Chronic systolic (congestive) heart failure: Secondary | ICD-10-CM | POA: Diagnosis not present

## 2022-03-12 DIAGNOSIS — I4892 Unspecified atrial flutter: Secondary | ICD-10-CM | POA: Diagnosis not present

## 2022-03-12 DIAGNOSIS — Z471 Aftercare following joint replacement surgery: Secondary | ICD-10-CM | POA: Diagnosis not present

## 2022-03-12 DIAGNOSIS — N189 Chronic kidney disease, unspecified: Secondary | ICD-10-CM | POA: Diagnosis not present

## 2022-03-12 DIAGNOSIS — I130 Hypertensive heart and chronic kidney disease with heart failure and stage 1 through stage 4 chronic kidney disease, or unspecified chronic kidney disease: Secondary | ICD-10-CM | POA: Diagnosis not present

## 2022-03-12 DIAGNOSIS — E1142 Type 2 diabetes mellitus with diabetic polyneuropathy: Secondary | ICD-10-CM | POA: Diagnosis not present

## 2022-03-15 ENCOUNTER — Other Ambulatory Visit: Payer: Self-pay | Admitting: Nurse Practitioner

## 2022-03-15 ENCOUNTER — Telehealth: Payer: Self-pay | Admitting: Nurse Practitioner

## 2022-03-15 MED ORDER — APIXABAN 5 MG PO TABS: 5.0000 mg | ORAL_TABLET | Freq: Two times a day (BID) | ORAL | 1 refills | Status: DC

## 2022-03-15 MED ORDER — APIXABAN 5 MG PO TABS
5.0000 mg | ORAL_TABLET | Freq: Two times a day (BID) | ORAL | 3 refills | Status: DC
Start: 2022-03-15 — End: 2023-03-09

## 2022-03-15 NOTE — Telephone Encounter (Signed)
   Patient's caretaker called this morning to indicate that patient has yet to receive Eliquis 5 mg twice daily from [facility] and will run out tomorrow.  I reviewed her medication list and note that her [facility] Eli[facility], and she will need a new prescription.  To allow for the time it will take for the mail order to arrive, I sent in a 15-day prescription to her local pharmacy in [place] and a separate 90-day prescription to [facility], with 3 refills on the latter.  Plan discussed with caregiver and patient.  Caller verbalized understanding and was grateful for the call back.  Murray Hodgkins, NP 03/15/2022, 11:58 AM

## 2022-03-17 DIAGNOSIS — N1832 Chronic kidney disease, stage 3b: Secondary | ICD-10-CM | POA: Diagnosis not present

## 2022-03-17 DIAGNOSIS — L89302 Pressure ulcer of unspecified buttock, stage 2: Secondary | ICD-10-CM | POA: Diagnosis not present

## 2022-03-17 DIAGNOSIS — E1129 Type 2 diabetes mellitus with other diabetic kidney complication: Secondary | ICD-10-CM | POA: Diagnosis not present

## 2022-03-17 DIAGNOSIS — B372 Candidiasis of skin and nail: Secondary | ICD-10-CM | POA: Diagnosis not present

## 2022-03-17 DIAGNOSIS — E113493 Type 2 diabetes mellitus with severe nonproliferative diabetic retinopathy without macular edema, bilateral: Secondary | ICD-10-CM | POA: Diagnosis not present

## 2022-03-17 DIAGNOSIS — I10 Essential (primary) hypertension: Secondary | ICD-10-CM | POA: Diagnosis not present

## 2022-03-18 DIAGNOSIS — I130 Hypertensive heart and chronic kidney disease with heart failure and stage 1 through stage 4 chronic kidney disease, or unspecified chronic kidney disease: Secondary | ICD-10-CM | POA: Diagnosis not present

## 2022-03-18 DIAGNOSIS — Z471 Aftercare following joint replacement surgery: Secondary | ICD-10-CM | POA: Diagnosis not present

## 2022-03-18 DIAGNOSIS — N189 Chronic kidney disease, unspecified: Secondary | ICD-10-CM | POA: Diagnosis not present

## 2022-03-18 DIAGNOSIS — E1142 Type 2 diabetes mellitus with diabetic polyneuropathy: Secondary | ICD-10-CM | POA: Diagnosis not present

## 2022-03-18 DIAGNOSIS — I5022 Chronic systolic (congestive) heart failure: Secondary | ICD-10-CM | POA: Diagnosis not present

## 2022-03-18 DIAGNOSIS — I4892 Unspecified atrial flutter: Secondary | ICD-10-CM | POA: Diagnosis not present

## 2022-03-19 DIAGNOSIS — I4892 Unspecified atrial flutter: Secondary | ICD-10-CM | POA: Diagnosis not present

## 2022-03-19 DIAGNOSIS — Z471 Aftercare following joint replacement surgery: Secondary | ICD-10-CM | POA: Diagnosis not present

## 2022-03-19 DIAGNOSIS — E1142 Type 2 diabetes mellitus with diabetic polyneuropathy: Secondary | ICD-10-CM | POA: Diagnosis not present

## 2022-03-19 DIAGNOSIS — I130 Hypertensive heart and chronic kidney disease with heart failure and stage 1 through stage 4 chronic kidney disease, or unspecified chronic kidney disease: Secondary | ICD-10-CM | POA: Diagnosis not present

## 2022-03-19 DIAGNOSIS — N189 Chronic kidney disease, unspecified: Secondary | ICD-10-CM | POA: Diagnosis not present

## 2022-03-19 DIAGNOSIS — I5022 Chronic systolic (congestive) heart failure: Secondary | ICD-10-CM | POA: Diagnosis not present

## 2022-03-22 DIAGNOSIS — I5022 Chronic systolic (congestive) heart failure: Secondary | ICD-10-CM | POA: Diagnosis not present

## 2022-03-22 DIAGNOSIS — Z96659 Presence of unspecified artificial knee joint: Secondary | ICD-10-CM | POA: Diagnosis not present

## 2022-03-22 DIAGNOSIS — I4892 Unspecified atrial flutter: Secondary | ICD-10-CM | POA: Diagnosis not present

## 2022-03-22 DIAGNOSIS — Z9181 History of falling: Secondary | ICD-10-CM | POA: Diagnosis not present

## 2022-03-22 DIAGNOSIS — Z7984 Long term (current) use of oral hypoglycemic drugs: Secondary | ICD-10-CM | POA: Diagnosis not present

## 2022-03-22 DIAGNOSIS — Z7901 Long term (current) use of anticoagulants: Secondary | ICD-10-CM | POA: Diagnosis not present

## 2022-03-22 DIAGNOSIS — Z9049 Acquired absence of other specified parts of digestive tract: Secondary | ICD-10-CM | POA: Diagnosis not present

## 2022-03-22 DIAGNOSIS — Z8673 Personal history of transient ischemic attack (TIA), and cerebral infarction without residual deficits: Secondary | ICD-10-CM | POA: Diagnosis not present

## 2022-03-22 DIAGNOSIS — E1142 Type 2 diabetes mellitus with diabetic polyneuropathy: Secondary | ICD-10-CM | POA: Diagnosis not present

## 2022-03-22 DIAGNOSIS — R32 Unspecified urinary incontinence: Secondary | ICD-10-CM | POA: Diagnosis not present

## 2022-03-22 DIAGNOSIS — Z471 Aftercare following joint replacement surgery: Secondary | ICD-10-CM | POA: Diagnosis not present

## 2022-03-22 DIAGNOSIS — Z96612 Presence of left artificial shoulder joint: Secondary | ICD-10-CM | POA: Diagnosis not present

## 2022-03-22 DIAGNOSIS — Z7985 Long-term (current) use of injectable non-insulin antidiabetic drugs: Secondary | ICD-10-CM | POA: Diagnosis not present

## 2022-03-22 DIAGNOSIS — I130 Hypertensive heart and chronic kidney disease with heart failure and stage 1 through stage 4 chronic kidney disease, or unspecified chronic kidney disease: Secondary | ICD-10-CM | POA: Diagnosis not present

## 2022-03-22 DIAGNOSIS — G4733 Obstructive sleep apnea (adult) (pediatric): Secondary | ICD-10-CM | POA: Diagnosis not present

## 2022-03-22 DIAGNOSIS — E039 Hypothyroidism, unspecified: Secondary | ICD-10-CM | POA: Diagnosis not present

## 2022-03-22 DIAGNOSIS — E785 Hyperlipidemia, unspecified: Secondary | ICD-10-CM | POA: Diagnosis not present

## 2022-03-22 DIAGNOSIS — N189 Chronic kidney disease, unspecified: Secondary | ICD-10-CM | POA: Diagnosis not present

## 2022-03-25 DIAGNOSIS — E1142 Type 2 diabetes mellitus with diabetic polyneuropathy: Secondary | ICD-10-CM | POA: Diagnosis not present

## 2022-03-25 DIAGNOSIS — I4892 Unspecified atrial flutter: Secondary | ICD-10-CM | POA: Diagnosis not present

## 2022-03-25 DIAGNOSIS — N189 Chronic kidney disease, unspecified: Secondary | ICD-10-CM | POA: Diagnosis not present

## 2022-03-25 DIAGNOSIS — Z471 Aftercare following joint replacement surgery: Secondary | ICD-10-CM | POA: Diagnosis not present

## 2022-03-25 DIAGNOSIS — I5022 Chronic systolic (congestive) heart failure: Secondary | ICD-10-CM | POA: Diagnosis not present

## 2022-03-25 DIAGNOSIS — I130 Hypertensive heart and chronic kidney disease with heart failure and stage 1 through stage 4 chronic kidney disease, or unspecified chronic kidney disease: Secondary | ICD-10-CM | POA: Diagnosis not present

## 2022-03-27 DIAGNOSIS — E1142 Type 2 diabetes mellitus with diabetic polyneuropathy: Secondary | ICD-10-CM | POA: Diagnosis not present

## 2022-03-27 DIAGNOSIS — Z471 Aftercare following joint replacement surgery: Secondary | ICD-10-CM | POA: Diagnosis not present

## 2022-03-27 DIAGNOSIS — N189 Chronic kidney disease, unspecified: Secondary | ICD-10-CM | POA: Diagnosis not present

## 2022-03-27 DIAGNOSIS — I4892 Unspecified atrial flutter: Secondary | ICD-10-CM | POA: Diagnosis not present

## 2022-03-27 DIAGNOSIS — I5022 Chronic systolic (congestive) heart failure: Secondary | ICD-10-CM | POA: Diagnosis not present

## 2022-03-27 DIAGNOSIS — I130 Hypertensive heart and chronic kidney disease with heart failure and stage 1 through stage 4 chronic kidney disease, or unspecified chronic kidney disease: Secondary | ICD-10-CM | POA: Diagnosis not present

## 2022-03-31 DIAGNOSIS — Z471 Aftercare following joint replacement surgery: Secondary | ICD-10-CM | POA: Diagnosis not present

## 2022-03-31 DIAGNOSIS — N189 Chronic kidney disease, unspecified: Secondary | ICD-10-CM | POA: Diagnosis not present

## 2022-03-31 DIAGNOSIS — I130 Hypertensive heart and chronic kidney disease with heart failure and stage 1 through stage 4 chronic kidney disease, or unspecified chronic kidney disease: Secondary | ICD-10-CM | POA: Diagnosis not present

## 2022-03-31 DIAGNOSIS — E1142 Type 2 diabetes mellitus with diabetic polyneuropathy: Secondary | ICD-10-CM | POA: Diagnosis not present

## 2022-03-31 DIAGNOSIS — I5022 Chronic systolic (congestive) heart failure: Secondary | ICD-10-CM | POA: Diagnosis not present

## 2022-03-31 DIAGNOSIS — I4892 Unspecified atrial flutter: Secondary | ICD-10-CM | POA: Diagnosis not present

## 2022-04-07 DIAGNOSIS — Z471 Aftercare following joint replacement surgery: Secondary | ICD-10-CM | POA: Diagnosis not present

## 2022-04-07 DIAGNOSIS — N189 Chronic kidney disease, unspecified: Secondary | ICD-10-CM | POA: Diagnosis not present

## 2022-04-07 DIAGNOSIS — I130 Hypertensive heart and chronic kidney disease with heart failure and stage 1 through stage 4 chronic kidney disease, or unspecified chronic kidney disease: Secondary | ICD-10-CM | POA: Diagnosis not present

## 2022-04-07 DIAGNOSIS — I4892 Unspecified atrial flutter: Secondary | ICD-10-CM | POA: Diagnosis not present

## 2022-04-07 DIAGNOSIS — I5022 Chronic systolic (congestive) heart failure: Secondary | ICD-10-CM | POA: Diagnosis not present

## 2022-04-07 DIAGNOSIS — E1142 Type 2 diabetes mellitus with diabetic polyneuropathy: Secondary | ICD-10-CM | POA: Diagnosis not present

## 2022-04-08 DIAGNOSIS — Z471 Aftercare following joint replacement surgery: Secondary | ICD-10-CM | POA: Diagnosis not present

## 2022-04-08 DIAGNOSIS — I4892 Unspecified atrial flutter: Secondary | ICD-10-CM | POA: Diagnosis not present

## 2022-04-08 DIAGNOSIS — I130 Hypertensive heart and chronic kidney disease with heart failure and stage 1 through stage 4 chronic kidney disease, or unspecified chronic kidney disease: Secondary | ICD-10-CM | POA: Diagnosis not present

## 2022-04-08 DIAGNOSIS — N189 Chronic kidney disease, unspecified: Secondary | ICD-10-CM | POA: Diagnosis not present

## 2022-04-08 DIAGNOSIS — E1142 Type 2 diabetes mellitus with diabetic polyneuropathy: Secondary | ICD-10-CM | POA: Diagnosis not present

## 2022-04-08 DIAGNOSIS — I5022 Chronic systolic (congestive) heart failure: Secondary | ICD-10-CM | POA: Diagnosis not present

## 2022-04-16 DIAGNOSIS — Z471 Aftercare following joint replacement surgery: Secondary | ICD-10-CM | POA: Diagnosis not present

## 2022-04-16 DIAGNOSIS — E1142 Type 2 diabetes mellitus with diabetic polyneuropathy: Secondary | ICD-10-CM | POA: Diagnosis not present

## 2022-04-16 DIAGNOSIS — I5022 Chronic systolic (congestive) heart failure: Secondary | ICD-10-CM | POA: Diagnosis not present

## 2022-04-16 DIAGNOSIS — N189 Chronic kidney disease, unspecified: Secondary | ICD-10-CM | POA: Diagnosis not present

## 2022-04-16 DIAGNOSIS — I130 Hypertensive heart and chronic kidney disease with heart failure and stage 1 through stage 4 chronic kidney disease, or unspecified chronic kidney disease: Secondary | ICD-10-CM | POA: Diagnosis not present

## 2022-04-16 DIAGNOSIS — I4892 Unspecified atrial flutter: Secondary | ICD-10-CM | POA: Diagnosis not present

## 2022-04-17 DIAGNOSIS — M549 Dorsalgia, unspecified: Secondary | ICD-10-CM | POA: Diagnosis not present

## 2022-04-17 DIAGNOSIS — M25551 Pain in right hip: Secondary | ICD-10-CM | POA: Diagnosis not present

## 2022-04-17 DIAGNOSIS — R102 Pelvic and perineal pain: Secondary | ICD-10-CM | POA: Diagnosis not present

## 2022-04-21 DIAGNOSIS — Z96612 Presence of left artificial shoulder joint: Secondary | ICD-10-CM | POA: Diagnosis not present

## 2022-04-21 DIAGNOSIS — Z471 Aftercare following joint replacement surgery: Secondary | ICD-10-CM | POA: Diagnosis not present

## 2022-04-21 DIAGNOSIS — E785 Hyperlipidemia, unspecified: Secondary | ICD-10-CM | POA: Diagnosis not present

## 2022-04-21 DIAGNOSIS — Z8673 Personal history of transient ischemic attack (TIA), and cerebral infarction without residual deficits: Secondary | ICD-10-CM | POA: Diagnosis not present

## 2022-04-21 DIAGNOSIS — I130 Hypertensive heart and chronic kidney disease with heart failure and stage 1 through stage 4 chronic kidney disease, or unspecified chronic kidney disease: Secondary | ICD-10-CM | POA: Diagnosis not present

## 2022-04-21 DIAGNOSIS — I5022 Chronic systolic (congestive) heart failure: Secondary | ICD-10-CM | POA: Diagnosis not present

## 2022-04-21 DIAGNOSIS — I4892 Unspecified atrial flutter: Secondary | ICD-10-CM | POA: Diagnosis not present

## 2022-04-21 DIAGNOSIS — E1122 Type 2 diabetes mellitus with diabetic chronic kidney disease: Secondary | ICD-10-CM | POA: Diagnosis not present

## 2022-04-21 DIAGNOSIS — M533 Sacrococcygeal disorders, not elsewhere classified: Secondary | ICD-10-CM | POA: Diagnosis not present

## 2022-04-21 DIAGNOSIS — R32 Unspecified urinary incontinence: Secondary | ICD-10-CM | POA: Diagnosis not present

## 2022-04-21 DIAGNOSIS — Z7985 Long-term (current) use of injectable non-insulin antidiabetic drugs: Secondary | ICD-10-CM | POA: Diagnosis not present

## 2022-04-21 DIAGNOSIS — N189 Chronic kidney disease, unspecified: Secondary | ICD-10-CM | POA: Diagnosis not present

## 2022-04-21 DIAGNOSIS — E039 Hypothyroidism, unspecified: Secondary | ICD-10-CM | POA: Diagnosis not present

## 2022-04-21 DIAGNOSIS — Z96659 Presence of unspecified artificial knee joint: Secondary | ICD-10-CM | POA: Diagnosis not present

## 2022-04-21 DIAGNOSIS — E1142 Type 2 diabetes mellitus with diabetic polyneuropathy: Secondary | ICD-10-CM | POA: Diagnosis not present

## 2022-04-21 DIAGNOSIS — Z7984 Long term (current) use of oral hypoglycemic drugs: Secondary | ICD-10-CM | POA: Diagnosis not present

## 2022-04-21 DIAGNOSIS — M160 Bilateral primary osteoarthritis of hip: Secondary | ICD-10-CM | POA: Diagnosis not present

## 2022-04-21 DIAGNOSIS — Z9049 Acquired absence of other specified parts of digestive tract: Secondary | ICD-10-CM | POA: Diagnosis not present

## 2022-04-21 DIAGNOSIS — Z7901 Long term (current) use of anticoagulants: Secondary | ICD-10-CM | POA: Diagnosis not present

## 2022-04-21 DIAGNOSIS — G4733 Obstructive sleep apnea (adult) (pediatric): Secondary | ICD-10-CM | POA: Diagnosis not present

## 2022-04-21 DIAGNOSIS — M47816 Spondylosis without myelopathy or radiculopathy, lumbar region: Secondary | ICD-10-CM | POA: Diagnosis not present

## 2022-04-21 DIAGNOSIS — Z9181 History of falling: Secondary | ICD-10-CM | POA: Diagnosis not present

## 2022-04-22 DIAGNOSIS — Z471 Aftercare following joint replacement surgery: Secondary | ICD-10-CM | POA: Diagnosis not present

## 2022-04-22 DIAGNOSIS — N189 Chronic kidney disease, unspecified: Secondary | ICD-10-CM | POA: Diagnosis not present

## 2022-04-22 DIAGNOSIS — E1142 Type 2 diabetes mellitus with diabetic polyneuropathy: Secondary | ICD-10-CM | POA: Diagnosis not present

## 2022-04-22 DIAGNOSIS — I5022 Chronic systolic (congestive) heart failure: Secondary | ICD-10-CM | POA: Diagnosis not present

## 2022-04-22 DIAGNOSIS — E1122 Type 2 diabetes mellitus with diabetic chronic kidney disease: Secondary | ICD-10-CM | POA: Diagnosis not present

## 2022-04-22 DIAGNOSIS — I130 Hypertensive heart and chronic kidney disease with heart failure and stage 1 through stage 4 chronic kidney disease, or unspecified chronic kidney disease: Secondary | ICD-10-CM | POA: Diagnosis not present

## 2022-04-24 DIAGNOSIS — I5022 Chronic systolic (congestive) heart failure: Secondary | ICD-10-CM | POA: Diagnosis not present

## 2022-04-24 DIAGNOSIS — N189 Chronic kidney disease, unspecified: Secondary | ICD-10-CM | POA: Diagnosis not present

## 2022-04-24 DIAGNOSIS — E1122 Type 2 diabetes mellitus with diabetic chronic kidney disease: Secondary | ICD-10-CM | POA: Diagnosis not present

## 2022-04-24 DIAGNOSIS — Z471 Aftercare following joint replacement surgery: Secondary | ICD-10-CM | POA: Diagnosis not present

## 2022-04-24 DIAGNOSIS — I130 Hypertensive heart and chronic kidney disease with heart failure and stage 1 through stage 4 chronic kidney disease, or unspecified chronic kidney disease: Secondary | ICD-10-CM | POA: Diagnosis not present

## 2022-04-24 DIAGNOSIS — E1142 Type 2 diabetes mellitus with diabetic polyneuropathy: Secondary | ICD-10-CM | POA: Diagnosis not present

## 2022-04-28 ENCOUNTER — Ambulatory Visit: Payer: Medicare Other | Admitting: Podiatry

## 2022-04-28 DIAGNOSIS — Z96612 Presence of left artificial shoulder joint: Secondary | ICD-10-CM | POA: Diagnosis not present

## 2022-04-29 DIAGNOSIS — I5022 Chronic systolic (congestive) heart failure: Secondary | ICD-10-CM | POA: Diagnosis not present

## 2022-04-29 DIAGNOSIS — E1142 Type 2 diabetes mellitus with diabetic polyneuropathy: Secondary | ICD-10-CM | POA: Diagnosis not present

## 2022-04-29 DIAGNOSIS — I130 Hypertensive heart and chronic kidney disease with heart failure and stage 1 through stage 4 chronic kidney disease, or unspecified chronic kidney disease: Secondary | ICD-10-CM | POA: Diagnosis not present

## 2022-04-29 DIAGNOSIS — Z471 Aftercare following joint replacement surgery: Secondary | ICD-10-CM | POA: Diagnosis not present

## 2022-04-29 DIAGNOSIS — E1122 Type 2 diabetes mellitus with diabetic chronic kidney disease: Secondary | ICD-10-CM | POA: Diagnosis not present

## 2022-04-29 DIAGNOSIS — N189 Chronic kidney disease, unspecified: Secondary | ICD-10-CM | POA: Diagnosis not present

## 2022-05-01 DIAGNOSIS — I5022 Chronic systolic (congestive) heart failure: Secondary | ICD-10-CM | POA: Diagnosis not present

## 2022-05-01 DIAGNOSIS — Z471 Aftercare following joint replacement surgery: Secondary | ICD-10-CM | POA: Diagnosis not present

## 2022-05-01 DIAGNOSIS — E1142 Type 2 diabetes mellitus with diabetic polyneuropathy: Secondary | ICD-10-CM | POA: Diagnosis not present

## 2022-05-01 DIAGNOSIS — E1122 Type 2 diabetes mellitus with diabetic chronic kidney disease: Secondary | ICD-10-CM | POA: Diagnosis not present

## 2022-05-01 DIAGNOSIS — N189 Chronic kidney disease, unspecified: Secondary | ICD-10-CM | POA: Diagnosis not present

## 2022-05-01 DIAGNOSIS — I130 Hypertensive heart and chronic kidney disease with heart failure and stage 1 through stage 4 chronic kidney disease, or unspecified chronic kidney disease: Secondary | ICD-10-CM | POA: Diagnosis not present

## 2022-05-05 DIAGNOSIS — K649 Unspecified hemorrhoids: Secondary | ICD-10-CM | POA: Diagnosis not present

## 2022-05-05 DIAGNOSIS — R300 Dysuria: Secondary | ICD-10-CM | POA: Diagnosis not present

## 2022-05-05 DIAGNOSIS — N3090 Cystitis, unspecified without hematuria: Secondary | ICD-10-CM | POA: Diagnosis not present

## 2022-05-06 DIAGNOSIS — N189 Chronic kidney disease, unspecified: Secondary | ICD-10-CM | POA: Insufficient documentation

## 2022-05-07 DIAGNOSIS — N189 Chronic kidney disease, unspecified: Secondary | ICD-10-CM | POA: Diagnosis not present

## 2022-05-07 DIAGNOSIS — Z471 Aftercare following joint replacement surgery: Secondary | ICD-10-CM | POA: Diagnosis not present

## 2022-05-07 DIAGNOSIS — I130 Hypertensive heart and chronic kidney disease with heart failure and stage 1 through stage 4 chronic kidney disease, or unspecified chronic kidney disease: Secondary | ICD-10-CM | POA: Diagnosis not present

## 2022-05-07 DIAGNOSIS — I5022 Chronic systolic (congestive) heart failure: Secondary | ICD-10-CM | POA: Diagnosis not present

## 2022-05-07 DIAGNOSIS — E1142 Type 2 diabetes mellitus with diabetic polyneuropathy: Secondary | ICD-10-CM | POA: Diagnosis not present

## 2022-05-07 DIAGNOSIS — E1122 Type 2 diabetes mellitus with diabetic chronic kidney disease: Secondary | ICD-10-CM | POA: Diagnosis not present

## 2022-05-09 ENCOUNTER — Encounter: Payer: Self-pay | Admitting: Cardiology

## 2022-05-09 ENCOUNTER — Ambulatory Visit: Payer: Medicare Other | Attending: Cardiology | Admitting: Cardiology

## 2022-05-09 VITALS — BP 134/74 | Ht 66.0 in | Wt 216.4 lb

## 2022-05-09 DIAGNOSIS — E785 Hyperlipidemia, unspecified: Secondary | ICD-10-CM | POA: Diagnosis not present

## 2022-05-09 DIAGNOSIS — Z7901 Long term (current) use of anticoagulants: Secondary | ICD-10-CM | POA: Diagnosis not present

## 2022-05-09 DIAGNOSIS — I447 Left bundle-branch block, unspecified: Secondary | ICD-10-CM

## 2022-05-09 DIAGNOSIS — I110 Hypertensive heart disease with heart failure: Secondary | ICD-10-CM

## 2022-05-09 DIAGNOSIS — I484 Atypical atrial flutter: Secondary | ICD-10-CM | POA: Diagnosis not present

## 2022-05-09 NOTE — Patient Instructions (Signed)
Medication Instructions:  Your physician recommends that you continue on your current medications as directed. Please refer to the Current Medication list given to you today.  *If you need a refill on your cardiac medications before your next appointment, please call your pharmacy*   Lab Work: None If you have labs (blood work) drawn today and your tests are completely normal, you will receive your results only by: [place] (if you have MyChart) OR A paper copy in the mail If you have any lab test that is abnormal or we need to change your treatment, we will call you to review the results.   Testing/Procedures: None   Follow-Up: At [facility], you and your health needs are our priority.  As part of our continuing mission to provide you with exceptional heart care, we have created designated Provider Care Teams.  These Care Teams include your primary Cardiologist (physician) and Advanced Practice Providers (APPs -  Physician Assistants and Nurse Practitioners) who all work together to provide you with the care you need, when you need it.  We recommend signing up for the patient portal called "MyChart".  Sign up information is provided on this After Visit Summary.  MyChart is used to connect with patients for Virtual Visits (Telemedicine).  Patients are able to view lab/test results, encounter notes, upcoming appointments, etc.  Non-urgent messages can be sent to your provider as well.   To learn more about what you can do with MyChart, go to [email or web address].    Your next appointment:   6 month(s)  The format for your next appointment:   In Person  Provider:   Shirlee More, MD    Other Instructions Get a smart watch, Apple or FitBit.  Important Information About Sugar

## 2022-05-09 NOTE — Progress Notes (Signed)
Cardiology Office Note:    Date:  05/09/2022   ID:  Debra Mcconnell, DOB 1946/05/03, MRN [id number]  PCP:  Greig Right, MD  Cardiologist:  Shirlee More, MD    Referring MD: Greig Right, MD    ASSESSMENT:    1. Atypical atrial flutter ([place])   2. Chronic anticoagulation   3. LBBB (left bundle branch block)   4. Hypertensive heart disease with heart failure ([facility])   5. Hyperlipidemia, unspecified hyperlipidemia type    PLAN:    In order of problems listed above:  Debra Mcconnell continues to do well she has rate controlled atypical atrial flutter we will continue her beta-blocker and her current anticoagulant.  I strongly encouraged her to purchase a smart watch to monitor her rhythm for symptomatic bradycardia although she is quite firm with me she did not lose consciousness Continue her anticoagulant Stable bundle branch block and previous EKG Heart failure is nicely compensated she is on good medical regimen including her loop diuretic carvedilol and Entresto.  I would not add MRA with her CKD With history of stroke she will continue her statin most recent lipid profile 03/17/2022 cholesterol 115 LDL 47 A1c 6.8 creatinine 1.7 potassium 4.6.   Next appointment: 38-month  Medication Adjustments/Labs and Tests Ordered: Current medicines are reviewed at length with the patient today.  Concerns regarding medicines are outlined above.  No orders of the defined types were placed in this encounter.  No orders of the defined types were placed in this encounter.   Chief Complaint  Patient presents with   Follow-up   Atrial Flutter    History of Present Illness:    Debra FINEBERGis a 76y.o. female with a hx of atrial arrhythmia with chronic atrial flutter previous stroke with long-term anticoagulation hypertensive heart disease with left bundle branch block cardiomyopathy and stage III CKD last seen 11/06/2021. Her most recent ejection fraction 02/19/2021 showed a marked improvement in  EF low normal 50 to 55% from previous 30 to 35% with severe left atrial enlargement moderate mitral regurgitation with Pisa 0.65 and normal RV function size moderately elevated pulmonary artery systolic pressure.   Compliance with diet, lifestyle and medications: Yes  Her sister and caregiver are present she had quite a tumble but had no fracture earlier this month They are all sure she did not lose consciousness Or cardiology perspective she has done well She is not having edema shortness of breath chest pain palpitation or syncope I strongly encouraged her to purchase a smart watch to pair with her Apple phone either Fitbit or Apple to screen her for bradycardia. They we will consider He tolerates her anticoagulant without bleeding and her statin without muscle pain or weakness  Past Medical History:  Diagnosis Date   Acquired hammer toes of both feet 01/22/2017   Arthritis 11/29/2013   Atrial flutter ([place] 01/24/2014   Overview:  CHADS2 vasc score= 5   B-complex deficiency 11/29/2013   Cerebral artery occlusion with cerebral infarction ([facility] 12/04/2013   Overview:  STORY: MRI +ve Left frontoparietal infarction (LMCA). +AFib.`E1o3L`IMPRESSION: BP is under controlled. Continue home health. +AFib, con't Eliquis 568mbid. [facility]   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   apixaban (ELIQUIS) 5 MG TABS tablet Take 1 tablet (5 mg total) by mouth 2 (two) times daily.   atorvastatin (LIPITOR) 40 MG tablet Take 40 mg by mouth at bedtime.   BD PEN NEEDLE NANO 2ND GEN 32G X 4 MM MISC USE WITH PEN AS DIRECTED.   Blood Glucose Monitoring Suppl (ACCU-CHEK GUIDE) w/Device KIT USE TO CHECK BLOOD SUGAR DAILY   buPROPion (WELLBUTRIN XL) 300 MG 24 hr tablet Take 300 mg by mouth daily.   carvedilol (COREG) 25 MG tablet Take 25 mg by mouth 2 (two) times daily with a meal.   Cholecalciferol (VITAMIN D) 2000 units CAPS Take 4,000 Units by mouth daily.   dapagliflozin propanediol (FARXIGA) 10 MG TABS tablet Take 1 tablet (10 mg total) by mouth daily before breakfast.   Exenatide ER 2 MG SRER Inject 2 mg into the skin every Sunday.   ferrous sulfate 325 (65 FE) MG EC tablet Take 325 mg by mouth 2 (two) times daily with a meal.   Fluticasone Propionate (ALLERGY RELIEF NA) Place 1 spray into the nose daily as needed (allergies).   furosemide (LASIX) 20 MG tablet Take 20-40 mg by mouth See admin instructions. Take 2 tablets (40 mg) in am and 1 tablet (20 mg) at noon   gabapentin (NEURONTIN) 300 MG capsule Take 300-1,200 mg by mouth See admin instructions. Take 300 mg in the morning and 300 mg at noon, and 4 capsule (1200mg) at bedtime   hydrALAZINE (APRESOLINE) 25 MG tablet Take 25 mg by mouth 3 (three) times daily.   HYDROcodone-acetaminophen (NORCO/VICODIN) 5-325 MG tablet Take 1 tablet by mouth every 6 (six)  hours as needed for pain.   ketoconazole (NIZORAL) 2 % cream Apply 1 application. topically daily as needed for irritation.   Lancets (ONETOUCH DELICA PLUS LANCET33G) MISC Apply 1 each topically daily.   levothyroxine (SYNTHROID, LEVOTHROID) 175 MCG tablet Take 175 mcg by mouth daily.   nitrofurantoin, macrocrystal-monohydrate, (MACROBID) 100 MG capsule Take 100 mg by mouth 2 (two) times daily.   ondansetron (ZOFRAN) 4 MG tablet Take 1 tablet (4 mg total) by mouth every 8 (eight) hours as needed for nausea or vomiting.   potassium chloride (KLOR-CON) 10 MEQ tablet Take 15 mEq by mouth daily at 12 noon.   PSYLLIUM HUSK PO Take 1 tablet by mouth daily.   sacubitril-valsartan (ENTRESTO) 49-51 MG Take 1 tablet by mouth 2 (two) times daily.   silver sulfADIAZINE (SILVADENE) 1 % cream Apply pea-sized amount to wound daily. (Patient taking differently: Apply 1 application  topically daily as needed (wound care). Apply pea-sized amount to wound daily.)     vitamin B-12 (CYANOCOBALAMIN) 1000 MCG tablet Take 1,000 mcg by mouth daily.   zolpidem (AMBIEN) 5 MG tablet Take 5 mg by mouth at bedtime as needed for sleep.     Allergies:   Lisinopril   Social History   Socioeconomic History   Marital status: Divorced    Spouse name: Not on file   Number of children: Not on file   Years of education: Not on file   Highest education level: Not on file  Occupational History   Not on file  Tobacco Use   Smoking status: Former   Smokeless tobacco: Never  Vaping Use   Vaping Use: Never used  Substance and Sexual Activity   Alcohol use: No   Drug use: No   Sexual activity: Not on file  Other Topics Concern   Not on file  Social History Narrative   Not on file   Social Determinants of Health   Financial Resource Strain: Not on file  Food Insecurity: Not on file  Transportation Needs: Not on file  Physical Activity: Not on file  Stress: Not on file  Social Connections: Not on file     Family  History: The patient's family history includes Cancer in her father; Heart disease in her father and mother. ROS:   Please see the history of present illness.    All other systems reviewed and are negative.  EKGs/Labs/Other Studies Reviewed:    The following studies were reviewed today:  Recent Labs: 01/28/2022: BUN 64; Creatinine, Ser 1.62; Hemoglobin 12.6; Platelets 142; Potassium 4.9; Sodium 142  Recent Lipid Panel    Component Value Date/Time   CHOL 108 01/25/2021 1628   TRIG 43 01/25/2021 1628   HDL 50 01/25/2021 1628   CHOLHDL 2.2 01/25/2021 1628   LDLCALC 47 01/25/2021 1628    Physical Exam:    VS:  BP 134/74 (BP Location: Right Arm, Patient Position: Sitting)   Ht 5' 6" (1.676 m)   Wt 216 lb 6.4 oz (98.2 kg)   SpO2 98%   BMI 34.93 kg/m     Wt Readings from Last 3 Encounters:  05/09/22 216 lb 6.4 oz (98.2 kg)  02/06/22 201 lb (91.2 kg)  01/28/22 201 lb (91.2 kg)     GEN:  Well nourished, well developed in no acute distress HEENT: Normal NECK: No JVD; No carotid bruits LYMPHATICS: No lymphadenopathy CARDIAC: RRR, no murmurs, rubs, gallops RESPIRATORY:  Clear to auscultation without rales, wheezing or rhonchi  ABDOMEN: Soft, non-tender, non-distended MUSCULOSKELETAL:  No edema; No deformity  SKIN: Warm and dry NEUROLOGIC:  Alert and oriented x 3 PSYCHIATRIC:  Normal affect    Signed,  , MD  05/09/2022 3:24 PM    [facility]  

## 2022-05-13 DIAGNOSIS — E1122 Type 2 diabetes mellitus with diabetic chronic kidney disease: Secondary | ICD-10-CM | POA: Diagnosis not present

## 2022-05-13 DIAGNOSIS — Z471 Aftercare following joint replacement surgery: Secondary | ICD-10-CM | POA: Diagnosis not present

## 2022-05-13 DIAGNOSIS — I130 Hypertensive heart and chronic kidney disease with heart failure and stage 1 through stage 4 chronic kidney disease, or unspecified chronic kidney disease: Secondary | ICD-10-CM | POA: Diagnosis not present

## 2022-05-13 DIAGNOSIS — I5022 Chronic systolic (congestive) heart failure: Secondary | ICD-10-CM | POA: Diagnosis not present

## 2022-05-13 DIAGNOSIS — E1142 Type 2 diabetes mellitus with diabetic polyneuropathy: Secondary | ICD-10-CM | POA: Diagnosis not present

## 2022-05-13 DIAGNOSIS — N189 Chronic kidney disease, unspecified: Secondary | ICD-10-CM | POA: Diagnosis not present

## 2022-05-20 ENCOUNTER — Encounter: Payer: Self-pay | Admitting: Podiatry

## 2022-05-20 ENCOUNTER — Ambulatory Visit (INDEPENDENT_AMBULATORY_CARE_PROVIDER_SITE_OTHER): Payer: Medicare Other | Admitting: Podiatry

## 2022-05-20 DIAGNOSIS — M79675 Pain in left toe(s): Secondary | ICD-10-CM

## 2022-05-20 DIAGNOSIS — M79674 Pain in right toe(s): Secondary | ICD-10-CM

## 2022-05-20 DIAGNOSIS — B351 Tinea unguium: Secondary | ICD-10-CM

## 2022-05-20 DIAGNOSIS — E1142 Type 2 diabetes mellitus with diabetic polyneuropathy: Secondary | ICD-10-CM

## 2022-05-20 NOTE — Progress Notes (Signed)
  Subjective:  Patient ID: Debra Mcconnell, female    DOB: 25-May-1946,  MRN: [id number]  Chief Complaint  Patient presents with   Diabetes    At risk diabetic foot care, history of ulcers   Nail Problem    Thick painful toenails, 3 month follow up     76 y.o. female presents with the above complaint. History confirmed with patient.  Patient presents for diabetic foot and nail care.  She has painful thickened elongated toenails on both feet.  She does also have a history of diabetes type 2 with peripheral neuropathy.  Previously had a toe ulceration however it is since healed.  Denies any new ulcerations or sores on either foot.  Here primarily for diabetic nail care due to inability to trim them related to thickness.  Objective:  Physical Exam: warm, good capillary refill, nail exam onychomycosis of the toenails and dystrophic nails, no trophic changes or ulcerative lesions. DP pulses palpable, PT pulses palpable, and protective sensation absent Left Foot: normal exam, no swelling, tenderness, instability; ligaments intact, full range of motion of all ankle/foot joints  Right Foot: normal exam, no swelling, tenderness, instability; ligaments intact, full range of motion of all ankle/foot joints   No images are attached to the encounter.  Assessment:   1. Pain due to onychomycosis of toenails of both feet   2. Diabetic polyneuropathy associated with type 2 diabetes mellitus ([place])      Plan:  Patient was evaluated and treated and all questions answered.  Onychomycosis, Onychodystrophy, Diabetes, and DPN -Patient is diabetic with a qualifying condition for at risk foot care. -Debrided nails x10 to healthy level with nail nipper Patient educated on diabetes. Discussed proper diabetic foot care and discussed risks and complications of disease. Educated patient in depth on reasons to return to the office immediately should he/she discover anything concerning or new on the feet. All questions  answered. Discussed proper shoes as well.    Return in about 3 months (around 08/19/2022) for [facility].         Everitt Amber, DPM [facility]

## 2022-05-21 DIAGNOSIS — R32 Unspecified urinary incontinence: Secondary | ICD-10-CM | POA: Diagnosis not present

## 2022-05-21 DIAGNOSIS — Z8673 Personal history of transient ischemic attack (TIA), and cerebral infarction without residual deficits: Secondary | ICD-10-CM | POA: Diagnosis not present

## 2022-05-21 DIAGNOSIS — Z7985 Long-term (current) use of injectable non-insulin antidiabetic drugs: Secondary | ICD-10-CM | POA: Diagnosis not present

## 2022-05-21 DIAGNOSIS — N189 Chronic kidney disease, unspecified: Secondary | ICD-10-CM | POA: Diagnosis not present

## 2022-05-21 DIAGNOSIS — Z9181 History of falling: Secondary | ICD-10-CM | POA: Diagnosis not present

## 2022-05-21 DIAGNOSIS — G4733 Obstructive sleep apnea (adult) (pediatric): Secondary | ICD-10-CM | POA: Diagnosis not present

## 2022-05-21 DIAGNOSIS — I5022 Chronic systolic (congestive) heart failure: Secondary | ICD-10-CM | POA: Diagnosis not present

## 2022-05-21 DIAGNOSIS — I130 Hypertensive heart and chronic kidney disease with heart failure and stage 1 through stage 4 chronic kidney disease, or unspecified chronic kidney disease: Secondary | ICD-10-CM | POA: Diagnosis not present

## 2022-05-21 DIAGNOSIS — Z96659 Presence of unspecified artificial knee joint: Secondary | ICD-10-CM | POA: Diagnosis not present

## 2022-05-21 DIAGNOSIS — E785 Hyperlipidemia, unspecified: Secondary | ICD-10-CM | POA: Diagnosis not present

## 2022-05-21 DIAGNOSIS — Z471 Aftercare following joint replacement surgery: Secondary | ICD-10-CM | POA: Diagnosis not present

## 2022-05-21 DIAGNOSIS — E1122 Type 2 diabetes mellitus with diabetic chronic kidney disease: Secondary | ICD-10-CM | POA: Diagnosis not present

## 2022-05-21 DIAGNOSIS — Z7984 Long term (current) use of oral hypoglycemic drugs: Secondary | ICD-10-CM | POA: Diagnosis not present

## 2022-05-21 DIAGNOSIS — Z7901 Long term (current) use of anticoagulants: Secondary | ICD-10-CM | POA: Diagnosis not present

## 2022-05-21 DIAGNOSIS — E1142 Type 2 diabetes mellitus with diabetic polyneuropathy: Secondary | ICD-10-CM | POA: Diagnosis not present

## 2022-05-21 DIAGNOSIS — Z9049 Acquired absence of other specified parts of digestive tract: Secondary | ICD-10-CM | POA: Diagnosis not present

## 2022-05-21 DIAGNOSIS — I4892 Unspecified atrial flutter: Secondary | ICD-10-CM | POA: Diagnosis not present

## 2022-05-21 DIAGNOSIS — Z96612 Presence of left artificial shoulder joint: Secondary | ICD-10-CM | POA: Diagnosis not present

## 2022-05-21 DIAGNOSIS — E039 Hypothyroidism, unspecified: Secondary | ICD-10-CM | POA: Diagnosis not present

## 2022-05-22 DIAGNOSIS — E1142 Type 2 diabetes mellitus with diabetic polyneuropathy: Secondary | ICD-10-CM | POA: Diagnosis not present

## 2022-05-22 DIAGNOSIS — N189 Chronic kidney disease, unspecified: Secondary | ICD-10-CM | POA: Diagnosis not present

## 2022-05-22 DIAGNOSIS — I130 Hypertensive heart and chronic kidney disease with heart failure and stage 1 through stage 4 chronic kidney disease, or unspecified chronic kidney disease: Secondary | ICD-10-CM | POA: Diagnosis not present

## 2022-05-22 DIAGNOSIS — Z471 Aftercare following joint replacement surgery: Secondary | ICD-10-CM | POA: Diagnosis not present

## 2022-05-22 DIAGNOSIS — I5022 Chronic systolic (congestive) heart failure: Secondary | ICD-10-CM | POA: Diagnosis not present

## 2022-05-22 DIAGNOSIS — E1122 Type 2 diabetes mellitus with diabetic chronic kidney disease: Secondary | ICD-10-CM | POA: Diagnosis not present

## 2022-05-28 DIAGNOSIS — N189 Chronic kidney disease, unspecified: Secondary | ICD-10-CM | POA: Diagnosis not present

## 2022-05-28 DIAGNOSIS — I130 Hypertensive heart and chronic kidney disease with heart failure and stage 1 through stage 4 chronic kidney disease, or unspecified chronic kidney disease: Secondary | ICD-10-CM | POA: Diagnosis not present

## 2022-05-28 DIAGNOSIS — Z471 Aftercare following joint replacement surgery: Secondary | ICD-10-CM | POA: Diagnosis not present

## 2022-05-28 DIAGNOSIS — E1122 Type 2 diabetes mellitus with diabetic chronic kidney disease: Secondary | ICD-10-CM | POA: Diagnosis not present

## 2022-05-28 DIAGNOSIS — E1142 Type 2 diabetes mellitus with diabetic polyneuropathy: Secondary | ICD-10-CM | POA: Diagnosis not present

## 2022-05-28 DIAGNOSIS — I5022 Chronic systolic (congestive) heart failure: Secondary | ICD-10-CM | POA: Diagnosis not present

## 2022-06-04 DIAGNOSIS — I130 Hypertensive heart and chronic kidney disease with heart failure and stage 1 through stage 4 chronic kidney disease, or unspecified chronic kidney disease: Secondary | ICD-10-CM | POA: Diagnosis not present

## 2022-06-04 DIAGNOSIS — E1122 Type 2 diabetes mellitus with diabetic chronic kidney disease: Secondary | ICD-10-CM | POA: Diagnosis not present

## 2022-06-04 DIAGNOSIS — E1142 Type 2 diabetes mellitus with diabetic polyneuropathy: Secondary | ICD-10-CM | POA: Diagnosis not present

## 2022-06-04 DIAGNOSIS — Z471 Aftercare following joint replacement surgery: Secondary | ICD-10-CM | POA: Diagnosis not present

## 2022-06-04 DIAGNOSIS — I5022 Chronic systolic (congestive) heart failure: Secondary | ICD-10-CM | POA: Diagnosis not present

## 2022-06-04 DIAGNOSIS — N189 Chronic kidney disease, unspecified: Secondary | ICD-10-CM | POA: Diagnosis not present

## 2022-06-16 DIAGNOSIS — I130 Hypertensive heart and chronic kidney disease with heart failure and stage 1 through stage 4 chronic kidney disease, or unspecified chronic kidney disease: Secondary | ICD-10-CM | POA: Diagnosis not present

## 2022-06-16 DIAGNOSIS — Z471 Aftercare following joint replacement surgery: Secondary | ICD-10-CM | POA: Diagnosis not present

## 2022-06-16 DIAGNOSIS — I5022 Chronic systolic (congestive) heart failure: Secondary | ICD-10-CM | POA: Diagnosis not present

## 2022-06-16 DIAGNOSIS — N189 Chronic kidney disease, unspecified: Secondary | ICD-10-CM | POA: Diagnosis not present

## 2022-06-16 DIAGNOSIS — E1122 Type 2 diabetes mellitus with diabetic chronic kidney disease: Secondary | ICD-10-CM | POA: Diagnosis not present

## 2022-06-16 DIAGNOSIS — E1142 Type 2 diabetes mellitus with diabetic polyneuropathy: Secondary | ICD-10-CM | POA: Diagnosis not present

## 2022-06-26 DIAGNOSIS — E113493 Type 2 diabetes mellitus with severe nonproliferative diabetic retinopathy without macular edema, bilateral: Secondary | ICD-10-CM | POA: Diagnosis not present

## 2022-06-26 DIAGNOSIS — Z89421 Acquired absence of other right toe(s): Secondary | ICD-10-CM | POA: Diagnosis not present

## 2022-06-26 DIAGNOSIS — Z23 Encounter for immunization: Secondary | ICD-10-CM | POA: Diagnosis not present

## 2022-06-26 DIAGNOSIS — N2581 Secondary hyperparathyroidism of renal origin: Secondary | ICD-10-CM | POA: Diagnosis not present

## 2022-06-26 DIAGNOSIS — E1129 Type 2 diabetes mellitus with other diabetic kidney complication: Secondary | ICD-10-CM | POA: Diagnosis not present

## 2022-06-26 DIAGNOSIS — I10 Essential (primary) hypertension: Secondary | ICD-10-CM | POA: Diagnosis not present

## 2022-06-26 DIAGNOSIS — Z6836 Body mass index (BMI) 36.0-36.9, adult: Secondary | ICD-10-CM | POA: Diagnosis not present

## 2022-06-26 DIAGNOSIS — E039 Hypothyroidism, unspecified: Secondary | ICD-10-CM | POA: Diagnosis not present

## 2022-06-26 DIAGNOSIS — E1169 Type 2 diabetes mellitus with other specified complication: Secondary | ICD-10-CM | POA: Diagnosis not present

## 2022-06-26 DIAGNOSIS — I6990 Unspecified sequelae of unspecified cerebrovascular disease: Secondary | ICD-10-CM | POA: Diagnosis not present

## 2022-06-26 DIAGNOSIS — N1832 Chronic kidney disease, stage 3b: Secondary | ICD-10-CM | POA: Diagnosis not present

## 2022-08-05 DIAGNOSIS — B354 Tinea corporis: Secondary | ICD-10-CM | POA: Diagnosis not present

## 2022-08-15 ENCOUNTER — Other Ambulatory Visit: Payer: Self-pay | Admitting: Cardiology

## 2022-08-15 DIAGNOSIS — E11621 Type 2 diabetes mellitus with foot ulcer: Secondary | ICD-10-CM | POA: Diagnosis not present

## 2022-08-19 ENCOUNTER — Ambulatory Visit (INDEPENDENT_AMBULATORY_CARE_PROVIDER_SITE_OTHER): Payer: Medicare Other | Admitting: Podiatry

## 2022-08-19 DIAGNOSIS — E1142 Type 2 diabetes mellitus with diabetic polyneuropathy: Secondary | ICD-10-CM | POA: Diagnosis not present

## 2022-08-19 DIAGNOSIS — M79674 Pain in right toe(s): Secondary | ICD-10-CM | POA: Diagnosis not present

## 2022-08-19 DIAGNOSIS — M79675 Pain in left toe(s): Secondary | ICD-10-CM

## 2022-08-19 DIAGNOSIS — E11621 Type 2 diabetes mellitus with foot ulcer: Secondary | ICD-10-CM

## 2022-08-19 DIAGNOSIS — L97522 Non-pressure chronic ulcer of other part of left foot with fat layer exposed: Secondary | ICD-10-CM | POA: Diagnosis not present

## 2022-08-19 DIAGNOSIS — B351 Tinea unguium: Secondary | ICD-10-CM

## 2022-08-19 MED ORDER — CEPHALEXIN 500 MG PO CAPS: 500.0000 mg | ORAL_CAPSULE | Freq: Three times a day (TID) | ORAL | 0 refills | Status: AC

## 2022-08-19 NOTE — Progress Notes (Signed)
Subjective:  Patient ID: RUKIA MCGILLIVRAY, female    DOB: 1946/04/21,  MRN: [id number]  Chief Complaint  Patient presents with   Nail Problem    Diabetic Foot Care    Wound Check    Ulcer to 3rd toe on left foot. Patient is currently taking Cipro that was prescribed by PCP.     76 y.o. female presents with the above complaint. History confirmed with patient.  Patient presents for diabetic foot and nail care.  She has painful thickened elongated toenails on both feet.  She does also have a history of diabetes type 2 with peripheral neuropathy.  Now presents with new lesion on the distal tuft of the 3rd toe left foot.  States that she has seen some drainage from this area recently.  She was placed on a course of antibiotics by her primary care and is taking ciprofloxacin.  Objective:  Physical Exam: warm, good capillary refill, nail exam onychomycosis of the toenails and dystrophic nails, no trophic changes or ulcerative lesions. DP pulses palpable, PT pulses palpable, and protective sensation absent Left Foot: Attention directed to the distal aspect of the third toe of the left foot there is a hyperkeratotic lesion which upon debridement yielded a circular ulceration to the level of subcutaneous fat tissue.  No active drainage wound does not appear to probe to distal phalanx bone.  There is some fibrotic tissue in the wound bed.  Mild erythema and edema of the distal aspect of the third toe however not pronounced.  Postdebridement the ulceration measures approximately 0.7 x 0.8 x 0.2 cm.  Right Foot: normal exam, no swelling, tenderness, instability; ligaments intact, full range of motion of all ankle/foot joints     Assessment:   1. Diabetic ulcer of toe of left foot associated with type 2 diabetes mellitus, with fat layer exposed ([place])   2. Pain due to onychomycosis of toenails of both feet   3. Diabetic polyneuropathy associated with type 2 diabetes mellitus ([place])       Plan:  Patient  was evaluated and treated and all questions answered.  Ulcer distal tuft of the third toe of the left foot to the level of subcutaneous fat tissue -We discussed the etiology and factors that are a part of the wound healing process.  We also discussed the risk of infection both soft tissue and osteomyelitis from open ulceration.  Discussed the risk of limb loss if this happens or worsens. -Debridement as below. -Dressed with Betadine, DSD. -Continue home dressing changes daily with Betadine and adhesive bandage or dry sterile dressing -Continue off-loading -Vascular testing deferred as patient does have pedal pulses and good bleeding postdebridement -HgbA1c: 7.0 last taken Jan 28 2022 -Last antibiotics: Recommend a course of cephalexin 500 mg 3 times daily for 10 days due to mild cellulitis of the toe. -Imaging: Deferred at this visit no evidence of deep or spreading infection. -Referral to [place] wound care center placed as patient would benefit from more routine visits and dressing changes/debridements of the left third toe to aid in wound healing.  Procedure: Excisional Debridement of Wound Rationale: Removal of non-viable soft tissue from the wound to promote healing.  Anesthesia: none Post-Debridement Wound Measurements: 0.7 cm x 0.8 cm x 0.2 cm  Type of Debridement: Sharp Excisional with 15 blade scalpel Tissue Removed: Non-viable soft tissue Depth of Debridement: subcutaneous tissue. Technique: Sharp excisional debridement to bleeding, viable wound base.  Dressing: Dry, sterile, compression dressing. Disposition: Patient tolerated procedure well.  Return in about 3 months (around 11/18/2022) for [facility].   Onychomycosis, Onychodystrophy, Diabetes, and DPN -Patient is diabetic with a qualifying condition for at risk foot care. -Debrided nails x10 to healthy level with nail nipper Patient educated on diabetes. Discussed proper diabetic foot care and discussed risks and complications  of disease. Educated patient in depth on reasons to return to the office immediately should he/she discover anything concerning or new on the feet. All questions answered. Discussed proper shoes as well.           Everitt Amber, DPM [facility] [place] [facility]

## 2022-09-09 ENCOUNTER — Ambulatory Visit (INDEPENDENT_AMBULATORY_CARE_PROVIDER_SITE_OTHER): Payer: Medicare Other | Admitting: Podiatry

## 2022-09-09 DIAGNOSIS — E11621 Type 2 diabetes mellitus with foot ulcer: Secondary | ICD-10-CM | POA: Diagnosis not present

## 2022-09-09 DIAGNOSIS — L97522 Non-pressure chronic ulcer of other part of left foot with fat layer exposed: Secondary | ICD-10-CM

## 2022-09-09 DIAGNOSIS — E1142 Type 2 diabetes mellitus with diabetic polyneuropathy: Secondary | ICD-10-CM

## 2022-09-09 DIAGNOSIS — I739 Peripheral vascular disease, unspecified: Secondary | ICD-10-CM

## 2022-09-09 NOTE — Progress Notes (Signed)
  Subjective:  Patient ID: Debra Mcconnell, female    DOB: 02/17/1946,  MRN: [id number]  Chief Complaint  Patient presents with   Nail Problem    Diabetic Foot Care     77 y.o. female presents with the above complaint.   Objective:  Physical Exam: warm, good capillary refill, nail exam onychomycosis of the toenails and dystrophic nails, no trophic changes or ulcerative lesions. DP pulses palpable, PT pulses palpable, and protective sensation absent Left Foot: Attention directed to the distal aspect of the third toe of the left foot there is a hyperkeratotic lesion which upon debridement yielded a circular ulceration to the level of subcutaneous fat tissue.  No active drainage wound does not appear to probe to distal phalanx bone.  There is some fibrotic tissue in the wound bed.  Mild erythema and edema of the distal aspect of the third toe however not pronounced.  Postdebridement the ulceration measures approximately 0.7 x 0.8 x 0.2 cm.  Right Foot: normal exam, no swelling, tenderness, instability; ligaments intact, full range of motion of all ankle/foot joints     Assessment:   No diagnosis found.     Plan:  Patient was evaluated and treated and all questions answered.  Ulcer distal tuft of the third toe of the left foot to the level of subcutaneous fat tissue -We discussed the etiology and factors that are a part of the wound healing process.  We also discussed the risk of infection both soft tissue and osteomyelitis from open ulceration.  Discussed the risk of limb loss if this happens or worsens. -Debridement as below. -Dressed with Betadine, DSD. -Continue home dressing changes daily with Betadine and adhesive bandage or dry sterile dressing -Continue off-loading -Vascular testing deferred as patient does have pedal pulses and good bleeding postdebridement -HgbA1c: 7.0 last taken Jan 28 2022 -Last antibiotics: Recommend a course of cephalexin 500 mg 3 times daily for 10 days  due to mild cellulitis of the toe. -Imaging: Deferred at this visit no evidence of deep or spreading infection. -Referral to [place] wound care center placed as patient would benefit from more routine visits and dressing changes/debridements of the left third toe to aid in wound healing.  Procedure: Excisional Debridement of Wound Rationale: Removal of non-viable soft tissue from the wound to promote healing.  Anesthesia: none Post-Debridement Wound Measurements: 0.7 cm x 0.8 cm x 0.2 cm  Type of Debridement: Sharp Excisional with 15 blade scalpel Tissue Removed: Non-viable soft tissue Depth of Debridement: subcutaneous tissue. Technique: Sharp excisional debridement to bleeding, viable wound base.  Dressing: Dry, sterile, compression dressing. Disposition: Patient tolerated procedure well.   No follow-ups on file.   Onychomycosis, Onychodystrophy, Diabetes, and DPN -Patient is diabetic with a qualifying condition for at risk foot care. -Debrided nails x10 to healthy level with nail nipper Patient educated on diabetes. Discussed proper diabetic foot care and discussed risks and complications of disease. Educated patient in depth on reasons to return to the office immediately should he/she discover anything concerning or new on the feet. All questions answered. Discussed proper shoes as well.           Everitt Amber, DPM [facility] [place] / [facility]

## 2022-09-09 NOTE — Progress Notes (Signed)
  Subjective:  Patient ID: Debra Mcconnell, female    DOB: 03/21/1946,  MRN: [id number]  Chief Complaint  Patient presents with   Wound Check    Ulcer to left foot    77 y.o. female presents with the above complaint.  Patient presents for follow-up on left foot distal third toe ulceration.  Patient reports she has not been doing any dressing changes.  She feels that overall the wound has been improving.  Objective:  Physical Exam: warm, good capillary refill, nail exam onychomycosis of the toenails and dystrophic nails, no trophic changes or ulcerative lesions. DP pulses palpable, PT pulses palpable, and protective sensation absent Left Foot: Attention directed to the distal aspect of the third toe of the left foot there is a hyperkeratotic lesion which upon debridement yielded a circular ulceration to the level of subcutaneous fat tissue decreased size from prior.  No active drainage wound does not appear to probe to distal phalanx bone.  There is some fibrotic tissue in the wound bed.  Decreased erythema and edema of the left third toe postdebridement the ulceration measures approximately 0.5 x 0.6 x 0.2 cm.    Right Foot: normal exam, no swelling, tenderness, instability; ligaments intact, full range of motion of all ankle/foot joints     Assessment:   1. Diabetic ulcer of toe of left foot associated with type 2 diabetes mellitus, with fat layer exposed ([place])   2. Diabetic polyneuropathy associated with type 2 diabetes mellitus ([place])   3. PAD (peripheral artery disease) ([place])        Plan:  Patient was evaluated and treated and all questions answered.  Ulcer distal tuft of the third toe of the left foot to the level of subcutaneous fat tissue -We discussed the etiology and factors that are a part of the wound healing process.  We also discussed the risk of infection both soft tissue and osteomyelitis from open ulceration.  Discussed the risk of limb loss if this happens or  worsens. -Debridement as below. -Dressed with Betadine, DSD. -Continue home dressing changes daily with Betadine and adhesive bandage or dry sterile dressing -Continue off-loading with postoperative shoe -Vascular testing deferred as patient does have pedal pulses and good bleeding postdebridement -HgbA1c: 7.0 last taken Jan 28 2022 -Last antibiotics: Status post course of cephalexin 500 mg 3 times daily for 10 days no further antibiotics recommended as cellulitis appears resolved -Imaging: Deferred at this visit no evidence of deep or spreading infection. -Referral to [place] wound care center placed as patient would benefit from more routine visits and dressing changes/debridements of the left third toe to aid in wound healing.  Procedure: Excisional Debridement of Wound Rationale: Removal of non-viable soft tissue from the wound to promote healing.  Anesthesia: none Post-Debridement Wound Measurements: 0.5 cm x 0.6 cm x 0.2 cm  Type of Debridement: Sharp Excisional with 15 blade scalpel Tissue Removed: Non-viable soft tissue Depth of Debridement: subcutaneous tissue. Technique: Sharp excisional debridement to bleeding, viable wound base.  Dressing: Dry, sterile, compression dressing. Disposition: Patient tolerated procedure well.   Return in about 4 weeks (around 10/07/2022) for Follow up L 3rd toe.         Everitt Amber, DPM [facility] [place] / [facility]

## 2022-09-16 DIAGNOSIS — Z9889 Other specified postprocedural states: Secondary | ICD-10-CM | POA: Diagnosis not present

## 2022-09-16 DIAGNOSIS — L97522 Non-pressure chronic ulcer of other part of left foot with fat layer exposed: Secondary | ICD-10-CM | POA: Diagnosis not present

## 2022-09-16 DIAGNOSIS — L97521 Non-pressure chronic ulcer of other part of left foot limited to breakdown of skin: Secondary | ICD-10-CM | POA: Diagnosis not present

## 2022-09-16 DIAGNOSIS — E11621 Type 2 diabetes mellitus with foot ulcer: Secondary | ICD-10-CM | POA: Diagnosis not present

## 2022-09-16 DIAGNOSIS — S92511A Displaced fracture of proximal phalanx of right lesser toe(s), initial encounter for closed fracture: Secondary | ICD-10-CM | POA: Diagnosis not present

## 2022-09-17 DIAGNOSIS — R9431 Abnormal electrocardiogram [ECG] [EKG]: Secondary | ICD-10-CM | POA: Diagnosis not present

## 2022-09-17 DIAGNOSIS — E1122 Type 2 diabetes mellitus with diabetic chronic kidney disease: Secondary | ICD-10-CM | POA: Diagnosis not present

## 2022-09-17 DIAGNOSIS — I130 Hypertensive heart and chronic kidney disease with heart failure and stage 1 through stage 4 chronic kidney disease, or unspecified chronic kidney disease: Secondary | ICD-10-CM | POA: Diagnosis not present

## 2022-09-17 DIAGNOSIS — I10 Essential (primary) hypertension: Secondary | ICD-10-CM | POA: Diagnosis not present

## 2022-09-17 DIAGNOSIS — S91301A Unspecified open wound, right foot, initial encounter: Secondary | ICD-10-CM | POA: Diagnosis not present

## 2022-09-17 DIAGNOSIS — S91302A Unspecified open wound, left foot, initial encounter: Secondary | ICD-10-CM | POA: Diagnosis not present

## 2022-09-17 DIAGNOSIS — M609 Myositis, unspecified: Secondary | ICD-10-CM | POA: Diagnosis not present

## 2022-09-17 DIAGNOSIS — J984 Other disorders of lung: Secondary | ICD-10-CM | POA: Diagnosis not present

## 2022-09-17 DIAGNOSIS — I509 Heart failure, unspecified: Secondary | ICD-10-CM | POA: Diagnosis not present

## 2022-09-17 DIAGNOSIS — E119 Type 2 diabetes mellitus without complications: Secondary | ICD-10-CM | POA: Diagnosis not present

## 2022-09-17 DIAGNOSIS — M86672 Other chronic osteomyelitis, left ankle and foot: Secondary | ICD-10-CM | POA: Diagnosis not present

## 2022-09-17 DIAGNOSIS — N183 Chronic kidney disease, stage 3 unspecified: Secondary | ICD-10-CM | POA: Diagnosis not present

## 2022-09-17 DIAGNOSIS — M25474 Effusion, right foot: Secondary | ICD-10-CM | POA: Diagnosis not present

## 2022-09-17 DIAGNOSIS — I517 Cardiomegaly: Secondary | ICD-10-CM | POA: Diagnosis not present

## 2022-09-17 DIAGNOSIS — M86671 Other chronic osteomyelitis, right ankle and foot: Secondary | ICD-10-CM | POA: Diagnosis not present

## 2022-09-23 DIAGNOSIS — L97522 Non-pressure chronic ulcer of other part of left foot with fat layer exposed: Secondary | ICD-10-CM | POA: Diagnosis not present

## 2022-09-23 DIAGNOSIS — L97521 Non-pressure chronic ulcer of other part of left foot limited to breakdown of skin: Secondary | ICD-10-CM | POA: Diagnosis not present

## 2022-09-23 DIAGNOSIS — E11621 Type 2 diabetes mellitus with foot ulcer: Secondary | ICD-10-CM | POA: Diagnosis not present

## 2022-09-30 DIAGNOSIS — E11621 Type 2 diabetes mellitus with foot ulcer: Secondary | ICD-10-CM | POA: Diagnosis not present

## 2022-09-30 DIAGNOSIS — L97521 Non-pressure chronic ulcer of other part of left foot limited to breakdown of skin: Secondary | ICD-10-CM | POA: Diagnosis not present

## 2022-09-30 DIAGNOSIS — L97522 Non-pressure chronic ulcer of other part of left foot with fat layer exposed: Secondary | ICD-10-CM | POA: Diagnosis not present

## 2022-10-01 DIAGNOSIS — I10 Essential (primary) hypertension: Secondary | ICD-10-CM | POA: Diagnosis not present

## 2022-10-01 DIAGNOSIS — E1129 Type 2 diabetes mellitus with other diabetic kidney complication: Secondary | ICD-10-CM | POA: Diagnosis not present

## 2022-10-01 DIAGNOSIS — E1140 Type 2 diabetes mellitus with diabetic neuropathy, unspecified: Secondary | ICD-10-CM | POA: Diagnosis not present

## 2022-10-01 DIAGNOSIS — E113493 Type 2 diabetes mellitus with severe nonproliferative diabetic retinopathy without macular edema, bilateral: Secondary | ICD-10-CM | POA: Diagnosis not present

## 2022-10-01 DIAGNOSIS — E039 Hypothyroidism, unspecified: Secondary | ICD-10-CM | POA: Diagnosis not present

## 2022-10-01 DIAGNOSIS — N2581 Secondary hyperparathyroidism of renal origin: Secondary | ICD-10-CM | POA: Diagnosis not present

## 2022-10-01 DIAGNOSIS — Z89421 Acquired absence of other right toe(s): Secondary | ICD-10-CM | POA: Diagnosis not present

## 2022-10-01 DIAGNOSIS — N1832 Chronic kidney disease, stage 3b: Secondary | ICD-10-CM | POA: Diagnosis not present

## 2022-10-01 DIAGNOSIS — Z6837 Body mass index (BMI) 37.0-37.9, adult: Secondary | ICD-10-CM | POA: Diagnosis not present

## 2022-10-01 DIAGNOSIS — E11621 Type 2 diabetes mellitus with foot ulcer: Secondary | ICD-10-CM | POA: Diagnosis not present

## 2022-10-07 ENCOUNTER — Ambulatory Visit (INDEPENDENT_AMBULATORY_CARE_PROVIDER_SITE_OTHER): Payer: Medicare Other | Admitting: Podiatry

## 2022-10-07 DIAGNOSIS — I739 Peripheral vascular disease, unspecified: Secondary | ICD-10-CM

## 2022-10-07 DIAGNOSIS — L97522 Non-pressure chronic ulcer of other part of left foot with fat layer exposed: Secondary | ICD-10-CM | POA: Diagnosis not present

## 2022-10-07 DIAGNOSIS — E11621 Type 2 diabetes mellitus with foot ulcer: Secondary | ICD-10-CM

## 2022-10-07 DIAGNOSIS — E1142 Type 2 diabetes mellitus with diabetic polyneuropathy: Secondary | ICD-10-CM

## 2022-10-07 NOTE — Progress Notes (Signed)
  Subjective:  Patient ID: Debra Mcconnell, female    DOB: 25-Jul-1946,  MRN: [id number]  Chief Complaint  Patient presents with   Wound Check    Ulcer to left 3rd toe.    77 y.o. female presents with the above complaint.  Patient presents for follow-up on left foot distal third toe ulceration.  Patient reports she has not been doing any dressing changes.  She feels that overall the wound has been improving.  Has been discharged from the wound care center at the [facility] wound care center.  Objective:  Physical Exam: warm, good capillary refill, nail exam onychomycosis of the toenails and dystrophic nails, no trophic changes or ulcerative lesions. DP pulses palpable, PT pulses palpable, and protective sensation absent Left Foot: Attention directed to the distal aspect of the third toe of the left foot there is a hyperkeratotic lesion which upon debridement yielded a circular superficial breakdown of the skin but no deep ulceration does not probe to subcutaneous fat no erythema or edema present    Right Foot: normal exam, no swelling, tenderness, instability; ligaments intact, full range of motion of all ankle/foot joints     Assessment:   1. Diabetic ulcer of toe of left foot associated with type 2 diabetes mellitus, with fat layer exposed ([place])   2. PAD (peripheral artery disease) ([place])   3. Diabetic polyneuropathy associated with type 2 diabetes mellitus ([place])      Plan:  Patient was evaluated and treated and all questions answered.  Ulcer distal tuft of the third toe of the left foot to the level of subcutaneous fat tissue -nearly fully resolved now with a small superficial ulceration limited to breakdown of skin no sign of infection -Debrided light callus off the distal tuft of the left third toe as below -Underlying wound is much improved from prior pinpoint breakdown superficially does not probe to subcutaneous fat tissue -Recommend continued use of a gel toe cap to cushion the  area. -Apply Betadine and Band-Aid to the area daily for the next 2 weeks -No antibiotics indicated  Procedure: Excisional Debridement of Wound Rationale: Removal of non-viable soft tissue from the wound to promote healing.  Anesthesia: none Post-Debridement Wound Measurements: 0.2 cm x 0.2 cm x 0.1 cm  Type of Debridement: Sharp Excisional Tissue Removed: Non-viable soft tissue Depth of Debridement: subcutaneous tissue. Technique: Sharp excisional debridement to bleeding, viable wound base.  Dressing: Dry, sterile, compression dressing. Disposition: Patient tolerated procedure well.     Return in about 3 weeks (around 10/28/2022) for Follow-up left third toe ulcer.         Everitt Amber, DPM [facility] [place] / [facility]

## 2022-10-28 ENCOUNTER — Ambulatory Visit (INDEPENDENT_AMBULATORY_CARE_PROVIDER_SITE_OTHER): Payer: Medicare Other | Admitting: Podiatry

## 2022-10-28 DIAGNOSIS — E11621 Type 2 diabetes mellitus with foot ulcer: Secondary | ICD-10-CM | POA: Diagnosis not present

## 2022-10-28 DIAGNOSIS — E1142 Type 2 diabetes mellitus with diabetic polyneuropathy: Secondary | ICD-10-CM

## 2022-10-28 DIAGNOSIS — L97522 Non-pressure chronic ulcer of other part of left foot with fat layer exposed: Secondary | ICD-10-CM

## 2022-10-28 DIAGNOSIS — I739 Peripheral vascular disease, unspecified: Secondary | ICD-10-CM

## 2022-10-28 NOTE — Progress Notes (Signed)
  Subjective:  Patient ID: Debra Mcconnell, female    DOB: [birth year],  MRN: [id number]  Chief Complaint  Patient presents with   Wound Check    Left 3rd toe     77 y.o. female presents with the above complaint.  Patient presents for follow-up on left foot distal third toe ulceration.  Patient states the area has gotten a little worse on the left third toe.  She denies pain but says that seems to have gotten more moisture on the wound related to wearing a gel toe cap over the area.  Objective:  Physical Exam: warm, good capillary refill, nail exam onychomycosis of the toenails and dystrophic nails, no trophic changes or ulcerative lesions. DP pulses palpable, PT pulses palpable, and protective sensation absent Left Foot: Attention directed to the distal aspect of the third toe of the left foot there is a area of hyperkeratotic tissue with underlying ulceration. Measures 0.8 x 0.2 x 0.2 cm post debridement. No erythema but mild maceration and malodor around the wound. No purulence expressed.   Right Foot: normal exam, no swelling, tenderness, instability; ligaments intact, full range of motion of all ankle/foot joints     Assessment:   1. Diabetic ulcer of toe of left foot associated with type 2 diabetes mellitus, with fat layer exposed ([place])   2. PAD (peripheral artery disease) ([place])   3. Diabetic polyneuropathy associated with type 2 diabetes mellitus ([place])       Plan:  Patient was evaluated and treated and all questions answered.  Ulcer distal tuft of the third toe of the left foot to the level of subcutaneous fat tissue - worse from prior -Debrided light callus off the distal tuft of the left third toe as below -Underlying wound is worse with signs of infection -Recommend course of 10 days Augmentin 875-125 mg twice daily for wound infection -Wound culture was collected at this visit -Apply Betadine and Band-Aid to the area daily until next follow-up -Will resend another  referral to the [place] wound care clinic as the wound has reopened and believe patient will benefit from ongoing weekly wound care and dressing changes.  Procedure: Excisional Debridement of Wound Rationale: Removal of non-viable soft tissue from the wound to promote healing.  Anesthesia: none Post-Debridement Wound Measurements: 0.8 cm x 0.2 cm x 0.2 cm  Type of Debridement: Sharp Excisional Tissue Removed: Non-viable soft tissue Depth of Debridement: subcutaneous tissue. Technique: Sharp excisional debridement to bleeding, viable wound base.  Dressing: Dry, sterile, compression dressing. Disposition: Patient tolerated procedure well.     Return in about 3 weeks (around 11/18/2022) for f/u L 3rd toe ulcer.         Everitt Amber, DPM [facility] [place] / [facility]

## 2022-10-29 ENCOUNTER — Telehealth: Payer: Self-pay | Admitting: Podiatry

## 2022-10-29 ENCOUNTER — Other Ambulatory Visit (INDEPENDENT_AMBULATORY_CARE_PROVIDER_SITE_OTHER): Payer: Medicare Other | Admitting: Podiatry

## 2022-10-29 MED ORDER — AMOXICILLIN-POT CLAVULANATE 875-125 MG PO TABS: 1.0000 | ORAL_TABLET | Freq: Two times a day (BID) | ORAL | 0 refills | Status: DC

## 2022-10-29 NOTE — Telephone Encounter (Signed)
Pt is calling following up on her Rx 10 days Augmentin 875-125 mg twice daily for wound infection   Please advise

## 2022-10-29 NOTE — Progress Notes (Signed)
E rx for antibiotics for wound infection placed

## 2022-11-03 ENCOUNTER — Other Ambulatory Visit (INDEPENDENT_AMBULATORY_CARE_PROVIDER_SITE_OTHER): Payer: Medicare Other | Admitting: Podiatry

## 2022-11-03 DIAGNOSIS — E11621 Type 2 diabetes mellitus with foot ulcer: Secondary | ICD-10-CM

## 2022-11-03 DIAGNOSIS — L97522 Non-pressure chronic ulcer of other part of left foot with fat layer exposed: Secondary | ICD-10-CM

## 2022-11-03 MED ORDER — CIPROFLOXACIN HCL 500 MG PO TABS: 500.0000 mg | ORAL_TABLET | Freq: Two times a day (BID) | ORAL | 0 refills | Status: AC

## 2022-11-03 NOTE — Progress Notes (Signed)
E Rx for ciprofloxacin sent as culture with p aeruginosa

## 2022-11-04 ENCOUNTER — Telehealth: Payer: Self-pay | Admitting: *Deleted

## 2022-11-04 DIAGNOSIS — G4733 Obstructive sleep apnea (adult) (pediatric): Secondary | ICD-10-CM | POA: Diagnosis not present

## 2022-11-04 LAB — WOUND CULTURE

## 2022-11-04 NOTE — Telephone Encounter (Signed)
Caretaker is calling for clarification on 2 antibiotics sent to pharmacy for patient ,ciproflxacin-02/26, Augmentin-02/21, not sure which one she is supposed to be taking or should she discontinue one to start the other? Please advise.

## 2022-11-05 NOTE — Telephone Encounter (Signed)
Updated the caretaker to discontinue Augmentin and start on the cipro based on culture that came back, verbalized understanding.

## 2022-11-13 DIAGNOSIS — G4733 Obstructive sleep apnea (adult) (pediatric): Secondary | ICD-10-CM | POA: Diagnosis not present

## 2022-11-18 ENCOUNTER — Ambulatory Visit: Payer: Medicare Other | Admitting: Podiatry

## 2022-11-18 ENCOUNTER — Ambulatory Visit (INDEPENDENT_AMBULATORY_CARE_PROVIDER_SITE_OTHER): Payer: Medicare Other | Admitting: Podiatry

## 2022-11-18 DIAGNOSIS — I739 Peripheral vascular disease, unspecified: Secondary | ICD-10-CM

## 2022-11-18 DIAGNOSIS — L97522 Non-pressure chronic ulcer of other part of left foot with fat layer exposed: Secondary | ICD-10-CM

## 2022-11-18 DIAGNOSIS — E11621 Type 2 diabetes mellitus with foot ulcer: Secondary | ICD-10-CM

## 2022-11-18 DIAGNOSIS — E1142 Type 2 diabetes mellitus with diabetic polyneuropathy: Secondary | ICD-10-CM

## 2022-11-18 NOTE — Progress Notes (Signed)
  Subjective:  Patient ID: Debra Mcconnell, female    DOB: 1946-06-18,  MRN: [id number]  Chief Complaint  Patient presents with   Wound Check    Left 3rd toe ulcer    77 y.o. female presents with the above complaint.  Patient presents for follow-up on left foot distal third toe ulceration.  Patient reports improvement of the wound since last visit.  She was placed on ciprofloxacin due to culture being positive for Pseudomonas aeruginosa.  Has finished a course of antibiotics.  Denies any drainage from the wound.  Objective:  Physical Exam: warm, good capillary refill, nail exam onychomycosis of the toenails and dystrophic nails, no trophic changes or ulcerative lesions. DP pulses palpable, PT pulses palpable, and protective sensation absent Left Foot: Attention directed to the distal aspect of the third toe of the left foot there is a area of hyperkeratotic tissue with underlying ulceration. Measures 0.5 x 0.4 x 0.2 cm post debridement. No erythema and decreased maceration from prior  Right Foot: normal exam, no swelling, tenderness, instability; ligaments intact, full range of motion of all ankle/foot joints     Assessment:   1. Diabetic ulcer of toe of left foot associated with type 2 diabetes mellitus, with fat layer exposed ([place])   2. PAD (peripheral artery disease) ([place])   3. Diabetic polyneuropathy associated with type 2 diabetes mellitus ([place])        Plan:  Patient was evaluated and treated and all questions answered.  Ulcer distal tuft of the third toe of the left foot to the level of subcutaneous fat tissue -improved from prior with decreased and resolved mild cellulitis and wound infection -Continue to monitor off antibiotics at this time patient is finished course of ciprofloxacin -Apply Betadine and Band-Aid to the area daily until next follow-up -Patient does not need to continue to follow-up with wound care she can continue to follow-up with me every 3 weeks for ongoing  wound care  Procedure: Excisional Debridement of Wound Rationale: Removal of non-viable soft tissue from the wound to promote healing.  Anesthesia: none Post-Debridement Wound Measurements: 0.5 cm x 0.4 cm x 0.2 cm  Type of Debridement: Sharp Excisional Tissue Removed: Non-viable soft tissue Depth of Debridement: subcutaneous tissue. Technique: Sharp excisional debridement to bleeding, viable wound base.  Dressing: Dry, sterile, compression dressing. Disposition: Patient tolerated procedure well.     Return in about 3 weeks (around 12/09/2022) for R 3rd toe ulcer.         Everitt Amber, DPM [facility] [place] / [facility]

## 2022-12-01 DIAGNOSIS — E039 Hypothyroidism, unspecified: Secondary | ICD-10-CM | POA: Diagnosis not present

## 2022-12-01 DIAGNOSIS — E1140 Type 2 diabetes mellitus with diabetic neuropathy, unspecified: Secondary | ICD-10-CM | POA: Diagnosis not present

## 2022-12-01 DIAGNOSIS — Z6837 Body mass index (BMI) 37.0-37.9, adult: Secondary | ICD-10-CM | POA: Diagnosis not present

## 2022-12-01 DIAGNOSIS — I10 Essential (primary) hypertension: Secondary | ICD-10-CM | POA: Diagnosis not present

## 2022-12-01 DIAGNOSIS — H6121 Impacted cerumen, right ear: Secondary | ICD-10-CM | POA: Diagnosis not present

## 2022-12-01 DIAGNOSIS — E1169 Type 2 diabetes mellitus with other specified complication: Secondary | ICD-10-CM | POA: Diagnosis not present

## 2022-12-01 DIAGNOSIS — E113493 Type 2 diabetes mellitus with severe nonproliferative diabetic retinopathy without macular edema, bilateral: Secondary | ICD-10-CM | POA: Diagnosis not present

## 2022-12-01 DIAGNOSIS — N1832 Chronic kidney disease, stage 3b: Secondary | ICD-10-CM | POA: Diagnosis not present

## 2022-12-01 DIAGNOSIS — E11621 Type 2 diabetes mellitus with foot ulcer: Secondary | ICD-10-CM | POA: Diagnosis not present

## 2022-12-01 DIAGNOSIS — Z89421 Acquired absence of other right toe(s): Secondary | ICD-10-CM | POA: Diagnosis not present

## 2022-12-01 DIAGNOSIS — N2581 Secondary hyperparathyroidism of renal origin: Secondary | ICD-10-CM | POA: Diagnosis not present

## 2022-12-01 DIAGNOSIS — E1129 Type 2 diabetes mellitus with other diabetic kidney complication: Secondary | ICD-10-CM | POA: Diagnosis not present

## 2022-12-08 DIAGNOSIS — B354 Tinea corporis: Secondary | ICD-10-CM | POA: Diagnosis not present

## 2022-12-09 ENCOUNTER — Ambulatory Visit (INDEPENDENT_AMBULATORY_CARE_PROVIDER_SITE_OTHER): Payer: Medicare Other | Admitting: Podiatry

## 2022-12-09 DIAGNOSIS — E1142 Type 2 diabetes mellitus with diabetic polyneuropathy: Secondary | ICD-10-CM | POA: Diagnosis not present

## 2022-12-09 DIAGNOSIS — L97522 Non-pressure chronic ulcer of other part of left foot with fat layer exposed: Secondary | ICD-10-CM | POA: Diagnosis not present

## 2022-12-09 DIAGNOSIS — I739 Peripheral vascular disease, unspecified: Secondary | ICD-10-CM | POA: Diagnosis not present

## 2022-12-09 DIAGNOSIS — E11621 Type 2 diabetes mellitus with foot ulcer: Secondary | ICD-10-CM

## 2022-12-09 NOTE — Addendum Note (Signed)
Addended by: Ames Coupe F on: 12/09/2022 02:33 PM   Modules accepted: Level of Service

## 2022-12-09 NOTE — Addendum Note (Signed)
Addended by: Ames Coupe F on: 12/09/2022 02:50 PM   Modules accepted: Level of Service

## 2022-12-09 NOTE — Progress Notes (Deleted)
Pt was a no show for apt, charge generated 

## 2022-12-09 NOTE — Progress Notes (Addendum)
  Subjective:  Patient ID: Debra Mcconnell, female    DOB: [birth year],  MRN: [id number]  No chief complaint on file.   77 y.o. female presents with the above complaint.  Patient presents for follow-up on left foot distal third toe ulceration.  Patient reports improvement of the wound since last visit.  Patient states the wound has fully healed  Objective:  Physical Exam: warm, good capillary refill, nail exam onychomycosis of the toenails and dystrophic nails, no trophic changes or ulcerative lesions. DP pulses palpable, PT pulses palpable, and protective sensation absent Left Foot: Attention directed to the distal aspect of the third toe of the left foot there is a hyperkeratotic lesion at the tuft of the third toe but there is no underlying ulceration improvement from prior wound is healed no erythema or edema of the third toe.  Right Foot: normal exam, no swelling, tenderness, instability; ligaments intact, full range of motion of all ankle/foot joints     Assessment:   1. Diabetic ulcer of toe of left foot associated with type 2 diabetes mellitus, with fat layer exposed   2. PAD (peripheral artery disease)   3. 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  Ulcer distal tuft of the third toe of the left foot to the level of subcutaneous fat tissue -improved from prior with decreased and resolved mild cellulitis and wound infection -Fully healed at this time -Recommend offloading with gel spacer.  Dispensed -Continue to monitor for any worsening of the wound. -Patient will follow-up in 6 weeks for ongoing routine nail care and monitoring of this preulcerative lesion  Return in about 6 weeks (around 01/20/2023) for Follow-up left third toe  callus and nail trim.         Everitt Amber, DPM [facility] [place] / [facility]

## 2023-01-08 NOTE — Progress Notes (Deleted)
Cardiology Office Note:    Date:  01/08/2023   ID:  Debra Mcconnell, DOB [birth year], MRN [id number]  PCP:  Alinda Deem, MD  Cardiologist:  Norman Herrlich, MD    Referring MD: Alinda Deem, MD    ASSESSMENT:    No diagnosis found. PLAN:    In order of problems listed above:  ***   Next appointment: ***   Medication Adjustments/Labs and Tests Ordered: Current medicines are reviewed at length with the patient today.  Concerns regarding medicines are outlined above.  No orders of the defined types were placed in this encounter.  No orders of the defined types were placed in this encounter.   No chief complaint on file.   History of Present Illness:    Debra Mcconnell is a 77 y.o. 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  Recent labs are 09/17/2020 hemoglobin 12.7 platelets 124,000 creatinine 1.70 GFR 29 cc sodium 143 potassium 4.2 She had an EKG in the emergency room 09/17/2022 that shows underlying atrial fibrillation flutter controlled ventricular rate left bundle branch block morphology QRS 60 bpm. Compliance with diet, lifestyle and medications: *** Past Medical History:  Diagnosis Date   Acquired hammer toes of both feet 01/22/2017   Arthritis 11/29/2013   Atrial flutter (HCC) 01/24/2014   Overview:  CHADS2 vasc score= 5   B-complex deficiency 11/29/2013   Cerebral artery occlusion with cerebral infarction (HCC) 12/04/2013   Overview:  STORY: MRI +ve Left frontoparietal infarction (LMCA). +AFib.`E1o3L`IMPRESSION: BP is under controlled. Continue home health. +AFib, con't Eliquis 5mg  bid. Will obtain old record from [facility] for review.   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     Allergies:   Lisinopril   Social History   Socioeconomic History   Marital status: Divorced    Spouse name: Not on file   Number of children: Not on file   Years of education: Not on file   Highest education level: Not on file  Occupational History   Not on file  Tobacco Use   Smoking status: Former   Smokeless tobacco: Never  Vaping Use   Vaping Use: Never used  Substance and Sexual Activity   Alcohol use: No   Drug use: No   Sexual activity: Not on file  Other Topics Concern   Not on file  Social History Narrative   Not on file   Social Determinants of Health   Financial Resource Strain: Not on file  Food Insecurity: Not on file  Transportation Needs: Not on file  Physical Activity: Not on file  Stress: Not on file  Social Connections: Not on file     Family History: The patient's ***family history includes Cancer in her father; Heart disease in her father and mother. ROS:   Please see the history of present illness.     All other systems reviewed and are negative.  EKGs/Labs/Other Studies Reviewed:    The following studies were reviewed today:  Cardiac Studies & Procedures       ECHOCARDIOGRAM  ECHOCARDIOGRAM COMPLETE 05/06/2021  Narrative ECHOCARDIOGRAM REPORT    Patient Name:   Debra Mcconnell Date of Exam: 02/19/2021 Medical Rec #:  [id number]     Height:       68.0 in Accession #:    [id number]    Weight:       217.0 lb Date of Birth:  Oct 02, 1945    BSA:          2.116 m Patient Age:    74 years      BP:           120/90 mmHg Patient Gender: F             HR:           62 bpm. Exam Location:  [place]  Procedure: 2D Echo  Indications:    Atrial flutter (HCC) [427.32.ICD-9-CM  History:        Patient has prior history of Echocardiogram examinations, most recent 10/06/2019. 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  Sonographer:    Louie Boston Referring Phys: [phone] Debra Mcconnell  IMPRESSIONS   1. Left ventricular ejection fraction, by estimation, is 50 to 55%. The left ventricle has low normal function. The left ventricle has no regional wall motion abnormalities. There is moderate left ventricular hypertrophy. 2. Right ventricular systolic function is normal. The right ventricular size is normal. There is moderately elevated pulmonary artery systolic pressure. 3. Left atrial size was severely dilated. 4. The mitral valve is normal in structure. Severe mitral valve regurgitation. No evidence of mitral stenosis. 5. The aortic valve is normal in structure. Aortic valve regurgitation is not visualized. No aortic stenosis is present. 6. The inferior vena cava is normal in size with greater than 50% respiratory variability, suggesting right atrial pressure of 3 mmHg.  FINDINGS Left Ventricle: Left ventricular ejection fraction, by estimation, is 50 to 55%. The left ventricle has low normal function. The left ventricle has no regional  wall motion abnormalities. The left ventricular internal cavity size was normal in size. There is moderate left ventricular hypertrophy. Left ventricular diastolic parameters are indeterminate.  Right Ventricle: The right ventricular size is normal. No increase in right ventricular wall thickness. Right ventricular systolic function is normal. There is moderately elevated pulmonary artery systolic pressure. The tricuspid regurgitant velocity is 3.35 m/s, and with an assumed right atrial pressure of 8 mmHg, the estimated right ventricular systolic pressure is 52.9 mmHg.  Left Atrium: Left atrial size was severely dilated.  Right Atrium: Right atrial size was normal in size.  Pericardium: There is no evidence of pericardial effusion.  Mitral Valve: The mitral valve is normal in structure. Severe mitral valve regurgitation. No evidence of mitral valve stenosis.  Tricuspid  Valve: The tricuspid valve is normal in structure. Tricuspid valve regurgitation is not demonstrated. No evidence of tricuspid stenosis.  Aortic Valve: The aortic valve is normal in structure. Aortic valve regurgitation is not visualized. No aortic stenosis is present.  Pulmonic Valve: The pulmonic valve was normal in structure. Pulmonic valve regurgitation is not visualized. No evidence of pulmonic stenosis.  Aorta: The aortic root is normal in size and structure.  Venous: The inferior vena cava is normal in size with greater than 50% respiratory variability, suggesting right atrial pressure of 3 mmHg.  IAS/Shunts: No atrial level shunt detected by color flow Doppler.   LEFT VENTRICLE PLAX 2D LVIDd:         5.70 cm      Diastology LVIDs:         4.50 cm      LV e' medial:    4.73 cm/s LV PW:         1.60 cm      LV E/e' medial:  26.4 LV IVS:        1.60 cm      LV e' lateral:   9.23 cm/s LVOT diam:     2.00 cm      LV E/e' lateral: 13.5 LV SV:         41 LV SV Index:   19 LVOT Area:     3.14 cm  LV Volumes  (MOD) LV vol d, MOD A2C: 101.0 ml LV vol d, MOD A4C: 107.0 ml LV vol s, MOD A2C: 60.6 ml LV vol s, MOD A4C: 62.4 ml LV SV MOD A2C:     40.4 ml LV SV MOD A4C:     107.0 ml LV SV MOD BP:      41.2 ml  RIGHT VENTRICLE            IVC RV S prime:     6.25 cm/s  IVC diam: 2.50 cm TAPSE (M-mode): 1.2 cm  LEFT ATRIUM              Index       RIGHT ATRIUM           Index LA diam:        6.20 cm  2.93 cm/m  RA Area:     21.00 cm LA Vol (A2C):   128.0 ml 60.50 ml/m RA Volume:   63.30 ml  29.92 ml/m LA Vol (A4C):   114.0 ml 53.88 ml/m LA Biplane Vol: 121.0 ml 57.19 ml/m AORTIC VALVE LVOT Vmax:   61.40 cm/s LVOT Vmean:  41.400 cm/s LVOT VTI:    0.131 m  AORTA Ao Root diam: 2.50 cm Ao Asc diam:  3.40 cm Ao Desc diam: 2.20 cm  MITRAL VALVE                 TRICUSPID VALVE MV Area (PHT): 5.06 cm      TR Peak grad:   44.9 mmHg MV Decel Time: 150 msec      TR Vmax:        335.00 cm/s MR Peak grad:    101.6 mmHg MR Mean grad:    65.0 mmHg   SHUNTS MR Vmax:         504.00 cm/s Systemic VTI:  0.13 m MR Vmean:        383.0 cm/s  Systemic Diam: 2.00 cm MR PISA:         2.65 cm MR PISA Eff ROA: 17 mm MR PISA Radius:  0.65 cm MV E velocity: 125.00 cm/s MV A velocity: 47.60 cm/s MV E/A ratio:  2.63  Belva Crome MD Electronically signed by Belva Crome MD Signature Date/Time: 02/19/2021/5:18:52 PM    Final    MONITORS  LONG TERM MONITOR (3-14 DAYS) 11/19/2021  Narrative Patch Wear Time:  3 days and 0 hours (2023-03-01T12:05:37-0500 to 2023-03-04T12:42:03-499)  Atrial Flutter occurred continuously (100% burden), ranging from 38-93 bpm (avg of 64 bpm). Bundle Branch Block/IVCD was present.  Good heart rate control heart rate in the range 99% of the time daytime 97% time nighttime 50 to 110 bpm and no pauses of greater than 3 seconds or sustained bradycardia.  Isolated VEs were occasional (3.2%, 7925), VE  Couplets were rare (<1.0%, 64), and no VE Triplets were present.  Ventricular Bigeminy and Trigeminy were present.  Conclusion atrial fibrillation/flutter with good heart rate control: Occasional PVCs.  There were no symptomatic events.           EKG:  EKG ordered today and personally reviewed.  The ekg ordered today demonstrates ***  Recent Labs: 01/28/2022: BUN 64; Creatinine, Ser 1.62; Hemoglobin 12.6; Platelets 142; Potassium 4.9; Sodium 142  Recent Lipid Panel    Component Value Date/Time   CHOL 108 01/25/2021 1628   TRIG 43 01/25/2021 1628   HDL 50 01/25/2021 1628   CHOLHDL 2.2 01/25/2021 1628   LDLCALC 47 01/25/2021 1628    Physical Exam:    VS:  There were no vitals taken for this visit.    Wt Readings from Last 3 Encounters:  05/09/22 216 lb 6.4 oz (98.2 kg)  02/06/22 201 lb (91.2 kg)  01/28/22 201 lb (91.2 kg)     GEN: *** Well nourished, well developed in no acute distress HEENT: Normal NECK: No JVD; No carotid bruits LYMPHATICS: No lymphadenopathy CARDIAC: ***RRR, no murmurs, rubs, gallops RESPIRATORY:  Clear to auscultation without rales, wheezing or rhonchi  ABDOMEN: Soft, non-tender, non-distended MUSCULOSKELETAL:  No edema; No deformity  SKIN: Warm and dry NEUROLOGIC:  Alert and oriented x 3 PSYCHIATRIC:  Normal affect    Signed, Norman Herrlich, MD  01/08/2023 6:04 PM    [facility]

## 2023-01-09 ENCOUNTER — Ambulatory Visit: Payer: Medicare Other | Admitting: Cardiology

## 2023-01-20 ENCOUNTER — Ambulatory Visit (INDEPENDENT_AMBULATORY_CARE_PROVIDER_SITE_OTHER): Payer: Medicare Other | Admitting: Podiatry

## 2023-01-20 DIAGNOSIS — I739 Peripheral vascular disease, unspecified: Secondary | ICD-10-CM | POA: Diagnosis not present

## 2023-01-20 DIAGNOSIS — M79675 Pain in left toe(s): Secondary | ICD-10-CM | POA: Diagnosis not present

## 2023-01-20 DIAGNOSIS — L84 Corns and callosities: Secondary | ICD-10-CM | POA: Diagnosis not present

## 2023-01-20 DIAGNOSIS — E1142 Type 2 diabetes mellitus with diabetic polyneuropathy: Secondary | ICD-10-CM

## 2023-01-20 DIAGNOSIS — B351 Tinea unguium: Secondary | ICD-10-CM

## 2023-01-20 DIAGNOSIS — M79674 Pain in right toe(s): Secondary | ICD-10-CM | POA: Diagnosis not present

## 2023-01-20 NOTE — Progress Notes (Signed)
  Subjective:  Patient ID: Debra Mcconnell, female    DOB: 09-20-1945,  MRN: [id number]  Chief Complaint  Patient presents with   foot care     Nail trim requested, callouse on the left 3rd digit    77 y.o. female presents with the above complaint. History confirmed with patient. Patient presenting with pain related to dystrophic thickened elongated nails as well as callus on the R 3rd toe. Patient is unable to trim own nails related to nail dystrophy and/or mobility issues. Patient does not have a history of T2DM. Patient does have callus present located at the left 3rd toe causing pain.   Objective:  Physical Exam: warm, good capillary refill nail exam onychomycosis of the toenails, onycholysis, and dystrophic nails DP pulses palpable, PT pulses palpable, and protective sensation intact Left Foot:  Pain with palpation of nails due to elongation and dystrophic growth. Hyperkeratotic lesion distal tuft of left 3rd toe Right Foot: Pain with palpation of nails due to elongation and dystrophic growth.   Assessment:   1. Callus of toe   2. Pain due to onychomycosis of toenails of both feet   3. PAD (peripheral artery disease) (HCC)   4. Diabetic polyneuropathy associated with type 2 diabetes mellitus (HCC)      Plan:  Patient was evaluated and treated and all questions answered.  #Hyperkeratotic lesions/pre ulcerative calluses present left distal 3rd toe All symptomatic hyperkeratoses x 1 separate lesions were safely debrided with a sterile #10 blade to patient's level of comfort without incident. We discussed preventative and palliative care of these lesions including supportive and accommodative shoegear, padding, prefabricated and custom molded accommodative orthoses, use of a pumice stone and lotions/creams daily.  #Onychomycosis with pain  -Nails palliatively debrided as below. -Educated on self-care  Procedure: Nail Debridement Rationale: Pain Type of Debridement: manual, sharp  debridement. Instrumentation: Nail nipper, rotary burr. Number of Nails: 10  Return in about 3 months (around 04/22/2023) for RFC.         Corinna Gab, DPM [facility]

## 2023-02-04 DIAGNOSIS — E113493 Type 2 diabetes mellitus with severe nonproliferative diabetic retinopathy without macular edema, bilateral: Secondary | ICD-10-CM | POA: Diagnosis not present

## 2023-02-04 DIAGNOSIS — E039 Hypothyroidism, unspecified: Secondary | ICD-10-CM | POA: Diagnosis not present

## 2023-02-04 DIAGNOSIS — N1832 Chronic kidney disease, stage 3b: Secondary | ICD-10-CM | POA: Diagnosis not present

## 2023-02-04 DIAGNOSIS — N2581 Secondary hyperparathyroidism of renal origin: Secondary | ICD-10-CM | POA: Diagnosis not present

## 2023-02-04 DIAGNOSIS — Z6838 Body mass index (BMI) 38.0-38.9, adult: Secondary | ICD-10-CM | POA: Diagnosis not present

## 2023-02-04 DIAGNOSIS — E1129 Type 2 diabetes mellitus with other diabetic kidney complication: Secondary | ICD-10-CM | POA: Diagnosis not present

## 2023-02-04 DIAGNOSIS — Z89421 Acquired absence of other right toe(s): Secondary | ICD-10-CM | POA: Diagnosis not present

## 2023-02-04 DIAGNOSIS — E1140 Type 2 diabetes mellitus with diabetic neuropathy, unspecified: Secondary | ICD-10-CM | POA: Diagnosis not present

## 2023-02-04 DIAGNOSIS — I10 Essential (primary) hypertension: Secondary | ICD-10-CM | POA: Diagnosis not present

## 2023-02-04 DIAGNOSIS — I5022 Chronic systolic (congestive) heart failure: Secondary | ICD-10-CM | POA: Diagnosis not present

## 2023-02-11 NOTE — Progress Notes (Unsigned)
Cardiology Office Note:    Date:  02/12/2023   ID:  Debra Mcconnell, DOB 02-12-1946, MRN [id number]  PCP:  Alinda Deem, MD  Cardiologist:  Norman Herrlich, MD    Referring MD: Alinda Deem, MD    ASSESSMENT:    1. Atypical atrial flutter (HCC)   2. Chronic anticoagulation   3. LBBB (left bundle branch block)   4. Hypertensive heart disease with heart failure (HCC)   5. Hyperlipidemia, unspecified hyperlipidemia type    PLAN:    In order of problems listed above:  Nazaria continues to do well with her chronic rhythm of atrial fibrillation or flutter rate controlled with her beta-blocker and continue her anticoagulant with history of stroke.  For safety I encouraged her to have a smart watch to follow her for symptomatic bradycardia or uncontrolled rate and she said she would consider either the apple or Fitbit device Blood pressure is at target continue her current medical therapy including her diuretic beta-blocker and hydralazine. Good result and continue her statin  Next appointment: 6 months   Medication Adjustments/Labs and Tests Ordered: Current medicines are reviewed at length with the patient today.  Concerns regarding medicines are outlined above.  No orders of the defined types were placed in this encounter.  No orders of the defined types were placed in this encounter.    History of Present Illness:    Debra Mcconnell is a 77 y.o. female with a hx of atrial arrhythmia and chronic atrial flutter previous stroke with long-term anticoagulation hypertensive heart disease with left bundle branch block cardiomyopathy with stage III CKD her last echocardiogram showed normalization EF 50 to 55% last seen 05/09/2022.Marland Kitchen Compliance with diet, lifestyle and medications: Yes  Since last visit she has done well no symptomatic bradycardia edema shortness of breath chest pain palpitation or syncope. Recent labs are 12/01/2022 LDL 47 cholesterol 112 A1c 7.1% hemoglobin 12.6 creatinine  1.43 Past Medical History:  Diagnosis Date   Acquired hammer toes of both feet 01/22/2017   Arthritis 11/29/2013   Atrial flutter (HCC) 01/24/2014   Overview:  CHADS2 vasc score= 5   B-complex deficiency 11/29/2013   Cerebral artery occlusion with cerebral infarction (HCC) 12/04/2013   Overview:  STORY: MRI +ve Left frontoparietal infarction (LMCA). +AFib.`E1o3L`IMPRESSION: BP is under controlled. Continue home health. +AFib, con't Eliquis 5mg  bid. Will obtain old record from [facility] for review.   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  Exenatide ER (BYDUREON BCISE) 2 MG/0.85ML AUIJ Inject 2 mg into the skin once a week.     Allergies:   Lisinopril   Social History   Socioeconomic History   Marital status: Divorced    Spouse name: Not on file   Number of children: Not on file   Years of education: Not on file   Highest education level: Not on file  Occupational History    Not on file  Tobacco Use   Smoking status: Former   Smokeless tobacco: Never  Vaping Use   Vaping Use: Never used  Substance and Sexual Activity   Alcohol use: No   Drug use: No   Sexual activity: Not on file  Other Topics Concern   Not on file  Social History Narrative   Not on file   Social Determinants of Health   Financial Resource Strain: Not on file  Food Insecurity: Not on file  Transportation Needs: Not on file  Physical Activity: Not on file  Stress: Not on file  Social Connections: Not on file     Family History: The patient's family history includes Cancer in her father; Heart disease in her father and mother. ROS:   Please see the history of present illness.    All other systems reviewed and are negative.  EKGs/Labs/Other Studies Reviewed:    The following studies were reviewed today:  Cardiac Studies & Procedures       ECHOCARDIOGRAM  ECHOCARDIOGRAM COMPLETE 05/06/2021  Narrative ECHOCARDIOGRAM REPORT    Patient Name:   Debra Mcconnell Date of Exam: 02/19/2021 Medical Rec #:  [id number]     Height:       68.0 in Accession #:    [id number]    Weight:       217.0 lb Date of Birth:  [birth year]    BSA:          2.116 m Patient Age:    77 years      BP:           120/90 mmHg Patient Gender: F             HR:           62 bpm. Exam Location:  [facility]  Procedure: 2D Echo  Indications:    Atrial flutter (HCC) [427.32.ICD-9-CM  History:        Patient has prior history of Echocardiogram examinations, most recent 10/06/2019. 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  Sonographer:    Louie Boston Referring Phys: [phone] Keshon Markovitz J Karesha Trzcinski  IMPRESSIONS   1. Left ventricular ejection fraction, by estimation, is 50 to 55%. The left ventricle has low normal function. The left ventricle has no regional wall motion abnormalities. There is moderate left ventricular hypertrophy. 2. Right ventricular  systolic function is normal. The right ventricular size is normal. There is moderately elevated pulmonary artery systolic pressure. 3. Left atrial size was severely dilated. 4. The mitral valve is normal in structure. Severe mitral valve regurgitation. No evidence of mitral stenosis. 5. The aortic valve is normal in structure. Aortic valve regurgitation is not visualized. No aortic stenosis is present. 6. The inferior vena cava is normal in size with greater than 50% respiratory variability, suggesting right atrial pressure of 3 mmHg.  FINDINGS Left Ventricle: Left ventricular ejection fraction, by estimation, is 50 to 55%. The left ventricle has low normal function. The left ventricle has no regional wall motion abnormalities. The left ventricular internal cavity size  was normal in size. There is moderate left ventricular hypertrophy. Left ventricular diastolic parameters are indeterminate.  Right Ventricle: The right ventricular size is normal. No increase in right ventricular wall thickness. Right ventricular systolic function is normal. There is moderately elevated pulmonary artery systolic pressure. The tricuspid regurgitant velocity is 3.35 m/s, and with an assumed right atrial pressure of 8 mmHg, the estimated right ventricular systolic pressure is 52.9 mmHg.  Left Atrium: Left atrial size was severely dilated.  Right Atrium: Right atrial size was normal in size.  Pericardium: There is no evidence of pericardial effusion.  Mitral Valve: The mitral valve is normal in structure. Severe mitral valve regurgitation. No evidence of mitral valve stenosis.  Tricuspid Valve: The tricuspid valve is normal in structure. Tricuspid valve regurgitation is not demonstrated. No evidence of tricuspid stenosis.  Aortic Valve: The aortic valve is normal in structure. Aortic valve regurgitation is not visualized. No aortic stenosis is present.  Pulmonic Valve: The pulmonic valve was normal in structure.  Pulmonic valve regurgitation is not visualized. No evidence of pulmonic stenosis.  Aorta: The aortic root is normal in size and structure.  Venous: The inferior vena cava is normal in size with greater than 50% respiratory variability, suggesting right atrial pressure of 3 mmHg.  IAS/Shunts: No atrial level shunt detected by color flow Doppler.   LEFT VENTRICLE PLAX 2D LVIDd:         5.70 cm      Diastology LVIDs:         4.50 cm      LV e' medial:    4.73 cm/s LV PW:         1.60 cm      LV E/e' medial:  26.4 LV IVS:        1.60 cm      LV e' lateral:   9.23 cm/s LVOT diam:     2.00 cm      LV E/e' lateral: 13.5 LV SV:         41 LV SV Index:   19 LVOT Area:     3.14 cm  LV Volumes (MOD) LV vol d, MOD A2C: 101.0 ml LV vol d, MOD A4C: 107.0 ml LV vol s, MOD A2C: 60.6 ml LV vol s, MOD A4C: 62.4 ml LV SV MOD A2C:     40.4 ml LV SV MOD A4C:     107.0 ml LV SV MOD BP:      41.2 ml  RIGHT VENTRICLE            IVC RV S prime:     6.25 cm/s  IVC diam: 2.50 cm TAPSE (M-mode): 1.2 cm  LEFT ATRIUM              Index       RIGHT ATRIUM           Index LA diam:        6.20 cm  2.93 cm/m  RA Area:     21.00 cm LA Vol (A2C):   128.0 ml 60.50 ml/m RA Volume:   63.30 ml  29.92 ml/m LA Vol (A4C):   114.0 ml 53.88 ml/m LA Biplane Vol: 121.0 ml 57.19 ml/m AORTIC VALVE LVOT Vmax:   61.40 cm/s LVOT Vmean:  41.400 cm/s LVOT VTI:    0.131 m  AORTA Ao Root diam: 2.50 cm Ao Asc diam:  3.40 cm Ao Desc diam: 2.20 cm  MITRAL VALVE  TRICUSPID VALVE MV Area (PHT): 5.06 cm      TR Peak grad:   44.9 mmHg MV Decel Time: 150 msec      TR Vmax:        335.00 cm/s MR Peak grad:    101.6 mmHg MR Mean grad:    65.0 mmHg   SHUNTS MR Vmax:         504.00 cm/s Systemic VTI:  0.13 m MR Vmean:        383.0 cm/s  Systemic Diam: 2.00 cm MR PISA:         2.65 cm MR PISA Eff ROA: 17 mm MR PISA Radius:  0.65 cm MV E velocity: 125.00 cm/s MV A velocity: 47.60 cm/s MV E/A ratio:   2.63  Belva Crome MD Electronically signed by Belva Crome MD Signature Date/Time: 02/19/2021/5:18:52 PM    Final    MONITORS  LONG TERM MONITOR (3-14 DAYS) 11/19/2021  Narrative Patch Wear Time:  3 days and 0 hours (2023-03-01T12:05:37-0500 to 2023-03-04T12:42:03-499)  Atrial Flutter occurred continuously (100% burden), ranging from 38-93 bpm (avg of 64 bpm). Bundle Branch Block/IVCD was present.  Good heart rate control heart rate in the range 99% of the time daytime 97% time nighttime 50 to 110 bpm and no pauses of greater than 3 seconds or sustained bradycardia.  Isolated VEs were occasional (3.2%, 7925), VE  Couplets were rare (<1.0%, 64), and no VE Triplets were present. Ventricular Bigeminy and Trigeminy were present.  Conclusion atrial fibrillation/flutter with good heart rate control: Occasional PVCs.  There were no symptomatic events.           EKG: An E[facility] at [facility] 09[facility]17/2022 showed underlying atrial fibrillation/flutter rate 60 bpm nonspecific conduction delay.  Recent Labs: No results found for requested labs within last 365 days.  Recent Lipid Panel    Component Value Date/Time   CHOL 108 01/25/2021 1628   TRIG 43 01/25/2021 1628   HDL 50 01/25/2021 1628   CHOLHDL 2.2 01/25/2021 1628   LDLCALC 47 01/25/2021 1628    Physical Exam:    VS:  BP 122/78 (BP Location: Left Arm, Patient Position: Sitting, Cuff Size: Normal)   Pulse 61   Ht 5\' 6"  (1.676 m)   Wt 222 lb 3.2 oz (100.8 kg)   SpO2 94%   BMI 35.86 kg/m     Wt Readings from Last 3 Encounters:  02/12/23 222 lb 3.2 oz (100.8 kg)  05/09/22 216 lb 6.4 oz (98.2 kg)  02/06/22 201 lb (91.2 kg)     GEN:  Well nourished, well developed in no acute distress HEENT: Normal NECK: No JVD; No carotid bruits LYMPHATICS: No lymphadenopathy CARDIAC: Rhythm is irregular S1 is variable good no murmurs, rubs, gallops RESPIRATORY:  Clear to auscultation without rales, wheezing or rhonchi  ABDOMEN:  Soft, non-tender, non-distended MUSCULOSKELETAL:  No edema; No deformity  SKIN: Warm and dry NEUROLOGIC:  Alert and oriented x 3 PSYCHIATRIC:  Normal affect    Signed, Norman Herrlich, MD  02/12/2023 2:43 PM    [facility]

## 2023-02-12 ENCOUNTER — Encounter: Payer: Self-pay | Admitting: Cardiology

## 2023-02-12 ENCOUNTER — Ambulatory Visit: Payer: Medicare Other | Attending: Cardiology | Admitting: Cardiology

## 2023-02-12 VITALS — BP 122/78 | HR 61 | Ht 66.0 in | Wt 222.2 lb

## 2023-02-12 DIAGNOSIS — Z7901 Long term (current) use of anticoagulants: Secondary | ICD-10-CM | POA: Insufficient documentation

## 2023-02-12 DIAGNOSIS — I447 Left bundle-branch block, unspecified: Secondary | ICD-10-CM | POA: Diagnosis not present

## 2023-02-12 DIAGNOSIS — I484 Atypical atrial flutter: Secondary | ICD-10-CM | POA: Insufficient documentation

## 2023-02-12 DIAGNOSIS — E785 Hyperlipidemia, unspecified: Secondary | ICD-10-CM | POA: Diagnosis not present

## 2023-02-12 DIAGNOSIS — I110 Hypertensive heart disease with heart failure: Secondary | ICD-10-CM | POA: Insufficient documentation

## 2023-02-12 NOTE — Patient Instructions (Addendum)
Medication Instructions:  Your physician recommends that you continue on your current medications as directed. Please refer to the Current Medication list given to you today.  *If you need a refill on your cardiac medications before your next appointment, please call your pharmacy*   Lab Work: None Ordered If you have labs (blood work) drawn today and your tests are completely normal, you will receive your results only by: MyChart Message (if you have MyChart) OR A paper copy in the mail If you have any lab test that is abnormal or we need to change your treatment, we will call you to review the results.   Testing/Procedures: None Ordered   Follow-Up: At [facility], you and your health needs are our priority.  As part of our continuing mission to provide you with exceptional heart care, we have created designated Provider Care Teams.  These Care Teams include your primary Cardiologist (physician) and Advanced Practice Providers (APPs -  Physician Assistants and Nurse Practitioners) who all work together to provide you with the care you need, when you need it.  We recommend signing up for the patient portal called "MyChart".  Sign up information is provided on this After Visit Summary.  MyChart is used to connect with patients for Virtual Visits (Telemedicine).  Patients are able to view lab/test results, encounter notes, upcoming appointments, etc.  Non-urgent messages can be sent to your provider as well.   To learn more about what you can do with MyChart, go to [email or web address].    Your next appointment:   6 month(s)  The format for your next appointment:   In Person  Provider:   Norman Herrlich, MD    Other Instructions Omron BP Monitor Advise Smart Watch

## 2023-02-13 ENCOUNTER — Telehealth: Payer: Self-pay | Admitting: Cardiology

## 2023-02-13 NOTE — Telephone Encounter (Signed)
Anew Message:      Daughter called and saud pharmacist said patient needs a prior authorization for his Farxiga    *STAT* If patient is at the pharmacy, call can be transferred to refill team.   1. Which medications need to be refilled? (please list name of each medication and dose if known) Farxiga  2. Which pharmacy/location (including street and city if local pharmacy) is medication to be sent to?[facility] [address] [place]  3. Do they need a 30 day or 90 day supply? 90 days and refills

## 2023-02-16 ENCOUNTER — Telehealth: Payer: Self-pay

## 2023-02-16 ENCOUNTER — Other Ambulatory Visit (HOSPITAL_COMMUNITY): Payer: Self-pay

## 2023-02-16 NOTE — Telephone Encounter (Signed)
Pharmacy Patient Advocate Encounter   Received notification from [facility] that prior authorization for [facility] is needed.    PA submitted on 02/16/23 Key [id number] Status is pending  Haze Rushing, CPhT Pharmacy Patient Advocate Specialist Direct Number: [phone] Fax: [phone]

## 2023-02-16 NOTE — Telephone Encounter (Signed)
PA initiated, please see separate encounter for updates on determination. (I will route you back in once a decision has been made)  Joana Nolton, CPhT Pharmacy Patient Advocate Specialist Direct Number: [phone] Fax: [phone]  

## 2023-02-16 NOTE — Telephone Encounter (Signed)
Pharmacy Patient Advocate Encounter  Prior Authorization for Debra Mcconnell has been approved.    Effective dates: 01/17/23 through 02/16/24  Haze Rushing, CPhT Pharmacy Patient Advocate Specialist Direct Number: [phone] Fax: [phone]

## 2023-03-02 ENCOUNTER — Encounter: Payer: Self-pay | Admitting: Medical

## 2023-03-05 DIAGNOSIS — Z471 Aftercare following joint replacement surgery: Secondary | ICD-10-CM | POA: Diagnosis not present

## 2023-03-05 DIAGNOSIS — Z96612 Presence of left artificial shoulder joint: Secondary | ICD-10-CM | POA: Diagnosis not present

## 2023-03-07 ENCOUNTER — Other Ambulatory Visit: Payer: Self-pay | Admitting: Nurse Practitioner

## 2023-03-09 DIAGNOSIS — K591 Functional diarrhea: Secondary | ICD-10-CM | POA: Diagnosis not present

## 2023-03-09 DIAGNOSIS — F329 Major depressive disorder, single episode, unspecified: Secondary | ICD-10-CM | POA: Diagnosis not present

## 2023-03-09 NOTE — Telephone Encounter (Signed)
Prescription refill request for Eliquis received. Indication:aflutter Last office visit:6/24 Scr:1.4 3/24 Age: 77 Weight:100.8  kg  Prescription refilled

## 2023-04-01 DIAGNOSIS — R300 Dysuria: Secondary | ICD-10-CM | POA: Diagnosis not present

## 2023-04-01 DIAGNOSIS — E038 Other specified hypothyroidism: Secondary | ICD-10-CM | POA: Diagnosis not present

## 2023-04-01 DIAGNOSIS — E039 Hypothyroidism, unspecified: Secondary | ICD-10-CM | POA: Diagnosis not present

## 2023-04-01 DIAGNOSIS — E1169 Type 2 diabetes mellitus with other specified complication: Secondary | ICD-10-CM | POA: Diagnosis not present

## 2023-04-01 DIAGNOSIS — R5381 Other malaise: Secondary | ICD-10-CM | POA: Diagnosis not present

## 2023-04-08 DIAGNOSIS — Z1389 Encounter for screening for other disorder: Secondary | ICD-10-CM | POA: Diagnosis not present

## 2023-04-08 DIAGNOSIS — I6990 Unspecified sequelae of unspecified cerebrovascular disease: Secondary | ICD-10-CM | POA: Diagnosis not present

## 2023-04-08 DIAGNOSIS — E113493 Type 2 diabetes mellitus with severe nonproliferative diabetic retinopathy without macular edema, bilateral: Secondary | ICD-10-CM | POA: Diagnosis not present

## 2023-04-08 DIAGNOSIS — D631 Anemia in chronic kidney disease: Secondary | ICD-10-CM | POA: Diagnosis not present

## 2023-04-08 DIAGNOSIS — I10 Essential (primary) hypertension: Secondary | ICD-10-CM | POA: Diagnosis not present

## 2023-04-08 DIAGNOSIS — M810 Age-related osteoporosis without current pathological fracture: Secondary | ICD-10-CM | POA: Diagnosis not present

## 2023-04-08 DIAGNOSIS — Z0000 Encounter for general adult medical examination without abnormal findings: Secondary | ICD-10-CM | POA: Diagnosis not present

## 2023-04-08 DIAGNOSIS — Z6837 Body mass index (BMI) 37.0-37.9, adult: Secondary | ICD-10-CM | POA: Diagnosis not present

## 2023-04-08 DIAGNOSIS — N184 Chronic kidney disease, stage 4 (severe): Secondary | ICD-10-CM | POA: Diagnosis not present

## 2023-04-08 DIAGNOSIS — E669 Obesity, unspecified: Secondary | ICD-10-CM | POA: Diagnosis not present

## 2023-04-08 DIAGNOSIS — N2581 Secondary hyperparathyroidism of renal origin: Secondary | ICD-10-CM | POA: Diagnosis not present

## 2023-04-22 ENCOUNTER — Ambulatory Visit: Payer: Medicare Other | Admitting: Podiatry

## 2023-04-22 DIAGNOSIS — L84 Corns and callosities: Secondary | ICD-10-CM

## 2023-04-22 DIAGNOSIS — Z89421 Acquired absence of other right toe(s): Secondary | ICD-10-CM | POA: Diagnosis not present

## 2023-04-22 DIAGNOSIS — B351 Tinea unguium: Secondary | ICD-10-CM | POA: Diagnosis not present

## 2023-04-22 DIAGNOSIS — E1151 Type 2 diabetes mellitus with diabetic peripheral angiopathy without gangrene: Secondary | ICD-10-CM

## 2023-04-22 NOTE — Progress Notes (Signed)
       Subjective:  Patient ID: Debra Mcconnell, female    DOB: [birth year],  MRN: [id number]  Debra Mcconnell presents to clinic today for:  Chief Complaint  Patient presents with   [facility]    [facility]   Patient presents for diabetic footcare today.  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  She uses a walker to assist with ambulation.  She does have some swelling to the lower legs and feet.  She has had a previous amputation to the right third toe.  There is a corn on her left third toe.  Requests nail trimming as well since the nails are problematic in her shoe gear.  PCP is Alinda Deem, MD.  Date last seen was 03/09/2023.  Last HbA1c on 01/28/2022 was 7.0.  Allergies  Allergen Reactions   Lisinopril Cough   Review of Systems: Negative except as noted in the HPI.  Objective:  There were no vitals filed for this visit.  Debra Mcconnell is a pleasant 77 y.o. female in NAD. AAO x 3.  Vascular Examination: Patient has palpable DP pulse, absent PT pulse bilateral.  Delayed capillary refill bilateral toes.  Sparse digital hair bilateral.  Proximal to distal cooling WNL bilateral.    Dermatological Examination: Interspaces are clear with no open lesions noted bilateral.  Skin is shiny and atrophic bilateral.  Nails are 3-40mm thick, with yellowish/brown discoloration, subungual debris and distal onycholysis x 9.  There is pain with compression of nails x 9.  There is a hyperkeratotic lesion noted on the dorsal aspect of the left third toe.  Neurological Examination: Protective sensation diminished b/l LE.   Musculoskeletal Examination: Muscle strength 5/5 to all LE muscle groups b/l.  There is a previous right third toe amputation  Patient qualifies for at-risk foot care because of Q7 findings (toe amputation status) .  Assessment/Plan: 1. Dermatophytosis of nail   2. Type II diabetes mellitus with peripheral circulatory disorder (HCC)   3. Corns   4. Status post amputation of lesser toe, right (HCC)      Mycotic nails x 9 were sharply debrided with sterile nail nippers and power debriding burr to decrease bulk and length.  Hyperkeratotic lesion was shaved with #312 blade.   Return in about 3 months (around 07/23/2023) for [facility].   Clerance Lav, DPM, FACFAS [facility]     [address] [place], [place] [address]                Office [phone]  Fax [phone]

## 2023-05-08 DIAGNOSIS — R58 Hemorrhage, not elsewhere classified: Secondary | ICD-10-CM | POA: Diagnosis not present

## 2023-05-08 DIAGNOSIS — S01419A Laceration without foreign body of unspecified cheek and temporomandibular area, initial encounter: Secondary | ICD-10-CM | POA: Diagnosis not present

## 2023-05-08 DIAGNOSIS — I10 Essential (primary) hypertension: Secondary | ICD-10-CM | POA: Diagnosis not present

## 2023-05-08 DIAGNOSIS — Z7901 Long term (current) use of anticoagulants: Secondary | ICD-10-CM | POA: Diagnosis not present

## 2023-05-08 DIAGNOSIS — Z23 Encounter for immunization: Secondary | ICD-10-CM | POA: Diagnosis not present

## 2023-05-08 DIAGNOSIS — S0101XA Laceration without foreign body of scalp, initial encounter: Secondary | ICD-10-CM | POA: Diagnosis not present

## 2023-05-08 DIAGNOSIS — W19XXXA Unspecified fall, initial encounter: Secondary | ICD-10-CM | POA: Diagnosis not present

## 2023-05-08 DIAGNOSIS — G9389 Other specified disorders of brain: Secondary | ICD-10-CM | POA: Diagnosis not present

## 2023-05-08 DIAGNOSIS — I4891 Unspecified atrial fibrillation: Secondary | ICD-10-CM | POA: Diagnosis not present

## 2023-05-08 DIAGNOSIS — S0181XA Laceration without foreign body of other part of head, initial encounter: Secondary | ICD-10-CM | POA: Diagnosis not present

## 2023-05-08 DIAGNOSIS — I482 Chronic atrial fibrillation, unspecified: Secondary | ICD-10-CM | POA: Diagnosis not present

## 2023-05-14 DIAGNOSIS — E1129 Type 2 diabetes mellitus with other diabetic kidney complication: Secondary | ICD-10-CM | POA: Diagnosis not present

## 2023-05-14 DIAGNOSIS — N1832 Chronic kidney disease, stage 3b: Secondary | ICD-10-CM | POA: Diagnosis not present

## 2023-05-14 DIAGNOSIS — G4733 Obstructive sleep apnea (adult) (pediatric): Secondary | ICD-10-CM | POA: Diagnosis not present

## 2023-05-14 DIAGNOSIS — E113493 Type 2 diabetes mellitus with severe nonproliferative diabetic retinopathy without macular edema, bilateral: Secondary | ICD-10-CM | POA: Diagnosis not present

## 2023-05-14 DIAGNOSIS — Z6839 Body mass index (BMI) 39.0-39.9, adult: Secondary | ICD-10-CM | POA: Diagnosis not present

## 2023-05-14 DIAGNOSIS — E039 Hypothyroidism, unspecified: Secondary | ICD-10-CM | POA: Diagnosis not present

## 2023-05-14 DIAGNOSIS — E1140 Type 2 diabetes mellitus with diabetic neuropathy, unspecified: Secondary | ICD-10-CM | POA: Diagnosis not present

## 2023-05-14 DIAGNOSIS — I10 Essential (primary) hypertension: Secondary | ICD-10-CM | POA: Diagnosis not present

## 2023-05-14 DIAGNOSIS — N2581 Secondary hyperparathyroidism of renal origin: Secondary | ICD-10-CM | POA: Diagnosis not present

## 2023-05-14 DIAGNOSIS — I5022 Chronic systolic (congestive) heart failure: Secondary | ICD-10-CM | POA: Diagnosis not present

## 2023-05-27 DIAGNOSIS — Z961 Presence of intraocular lens: Secondary | ICD-10-CM | POA: Diagnosis not present

## 2023-05-27 DIAGNOSIS — H00012 Hordeolum externum right lower eyelid: Secondary | ICD-10-CM | POA: Diagnosis not present

## 2023-05-27 DIAGNOSIS — E113493 Type 2 diabetes mellitus with severe nonproliferative diabetic retinopathy without macular edema, bilateral: Secondary | ICD-10-CM | POA: Diagnosis not present

## 2023-05-27 DIAGNOSIS — Z7984 Long term (current) use of oral hypoglycemic drugs: Secondary | ICD-10-CM | POA: Diagnosis not present

## 2023-05-27 DIAGNOSIS — H01009 Unspecified blepharitis unspecified eye, unspecified eyelid: Secondary | ICD-10-CM | POA: Diagnosis not present

## 2023-05-27 DIAGNOSIS — H26491 Other secondary cataract, right eye: Secondary | ICD-10-CM | POA: Diagnosis not present

## 2023-05-27 DIAGNOSIS — H524 Presbyopia: Secondary | ICD-10-CM | POA: Diagnosis not present

## 2023-05-27 DIAGNOSIS — H47213 Primary optic atrophy, bilateral: Secondary | ICD-10-CM | POA: Diagnosis not present

## 2023-05-31 ENCOUNTER — Other Ambulatory Visit: Payer: Self-pay | Admitting: Cardiology

## 2023-06-12 DIAGNOSIS — Z89422 Acquired absence of other left toe(s): Secondary | ICD-10-CM | POA: Diagnosis not present

## 2023-06-12 DIAGNOSIS — Z79899 Other long term (current) drug therapy: Secondary | ICD-10-CM | POA: Diagnosis not present

## 2023-06-12 DIAGNOSIS — L03116 Cellulitis of left lower limb: Secondary | ICD-10-CM | POA: Diagnosis not present

## 2023-06-12 DIAGNOSIS — M86172 Other acute osteomyelitis, left ankle and foot: Secondary | ICD-10-CM | POA: Diagnosis not present

## 2023-06-12 DIAGNOSIS — E1169 Type 2 diabetes mellitus with other specified complication: Secondary | ICD-10-CM | POA: Diagnosis not present

## 2023-06-12 DIAGNOSIS — Z8673 Personal history of transient ischemic attack (TIA), and cerebral infarction without residual deficits: Secondary | ICD-10-CM | POA: Diagnosis not present

## 2023-06-12 DIAGNOSIS — E11628 Type 2 diabetes mellitus with other skin complications: Secondary | ICD-10-CM | POA: Diagnosis not present

## 2023-06-12 DIAGNOSIS — L97528 Non-pressure chronic ulcer of other part of left foot with other specified severity: Secondary | ICD-10-CM | POA: Diagnosis not present

## 2023-06-12 DIAGNOSIS — I130 Hypertensive heart and chronic kidney disease with heart failure and stage 1 through stage 4 chronic kidney disease, or unspecified chronic kidney disease: Secondary | ICD-10-CM | POA: Diagnosis not present

## 2023-06-12 DIAGNOSIS — M79673 Pain in unspecified foot: Secondary | ICD-10-CM | POA: Diagnosis not present

## 2023-06-12 DIAGNOSIS — N184 Chronic kidney disease, stage 4 (severe): Secondary | ICD-10-CM | POA: Diagnosis not present

## 2023-06-12 DIAGNOSIS — D631 Anemia in chronic kidney disease: Secondary | ICD-10-CM | POA: Diagnosis not present

## 2023-06-12 DIAGNOSIS — E1142 Type 2 diabetes mellitus with diabetic polyneuropathy: Secondary | ICD-10-CM | POA: Diagnosis not present

## 2023-06-12 DIAGNOSIS — E1122 Type 2 diabetes mellitus with diabetic chronic kidney disease: Secondary | ICD-10-CM | POA: Diagnosis not present

## 2023-06-12 DIAGNOSIS — D509 Iron deficiency anemia, unspecified: Secondary | ICD-10-CM | POA: Diagnosis not present

## 2023-06-12 DIAGNOSIS — Z87440 Personal history of urinary (tract) infections: Secondary | ICD-10-CM | POA: Diagnosis not present

## 2023-06-12 DIAGNOSIS — M79675 Pain in left toe(s): Secondary | ICD-10-CM | POA: Diagnosis not present

## 2023-06-12 DIAGNOSIS — E11621 Type 2 diabetes mellitus with foot ulcer: Secondary | ICD-10-CM | POA: Diagnosis not present

## 2023-06-12 DIAGNOSIS — B9562 Methicillin resistant Staphylococcus aureus infection as the cause of diseases classified elsewhere: Secondary | ICD-10-CM | POA: Diagnosis not present

## 2023-06-12 DIAGNOSIS — L97529 Non-pressure chronic ulcer of other part of left foot with unspecified severity: Secondary | ICD-10-CM | POA: Diagnosis not present

## 2023-06-12 DIAGNOSIS — Z888 Allergy status to other drugs, medicaments and biological substances status: Secondary | ICD-10-CM | POA: Diagnosis not present

## 2023-06-12 DIAGNOSIS — I4891 Unspecified atrial fibrillation: Secondary | ICD-10-CM | POA: Diagnosis not present

## 2023-06-12 DIAGNOSIS — I252 Old myocardial infarction: Secondary | ICD-10-CM | POA: Diagnosis not present

## 2023-06-12 DIAGNOSIS — S91109A Unspecified open wound of unspecified toe(s) without damage to nail, initial encounter: Secondary | ICD-10-CM | POA: Diagnosis not present

## 2023-06-12 DIAGNOSIS — R609 Edema, unspecified: Secondary | ICD-10-CM | POA: Diagnosis not present

## 2023-06-12 DIAGNOSIS — M79672 Pain in left foot: Secondary | ICD-10-CM | POA: Diagnosis not present

## 2023-06-12 DIAGNOSIS — E039 Hypothyroidism, unspecified: Secondary | ICD-10-CM | POA: Diagnosis not present

## 2023-06-12 DIAGNOSIS — Z7984 Long term (current) use of oral hypoglycemic drugs: Secondary | ICD-10-CM | POA: Diagnosis not present

## 2023-06-12 DIAGNOSIS — Z7901 Long term (current) use of anticoagulants: Secondary | ICD-10-CM | POA: Diagnosis not present

## 2023-06-12 DIAGNOSIS — M19072 Primary osteoarthritis, left ankle and foot: Secondary | ICD-10-CM | POA: Diagnosis not present

## 2023-06-12 DIAGNOSIS — I5022 Chronic systolic (congestive) heart failure: Secondary | ICD-10-CM | POA: Diagnosis not present

## 2023-06-12 DIAGNOSIS — M7989 Other specified soft tissue disorders: Secondary | ICD-10-CM | POA: Diagnosis not present

## 2023-06-12 DIAGNOSIS — M869 Osteomyelitis, unspecified: Secondary | ICD-10-CM | POA: Diagnosis not present

## 2023-06-22 DIAGNOSIS — M79672 Pain in left foot: Secondary | ICD-10-CM | POA: Diagnosis not present

## 2023-06-22 DIAGNOSIS — Z89422 Acquired absence of other left toe(s): Secondary | ICD-10-CM | POA: Diagnosis not present

## 2023-06-22 DIAGNOSIS — Z9889 Other specified postprocedural states: Secondary | ICD-10-CM | POA: Diagnosis not present

## 2023-06-25 DIAGNOSIS — S0012XA Contusion of left eyelid and periocular area, initial encounter: Secondary | ICD-10-CM | POA: Diagnosis not present

## 2023-06-25 DIAGNOSIS — S022XXA Fracture of nasal bones, initial encounter for closed fracture: Secondary | ICD-10-CM | POA: Diagnosis not present

## 2023-06-25 DIAGNOSIS — R0781 Pleurodynia: Secondary | ICD-10-CM | POA: Diagnosis not present

## 2023-06-25 DIAGNOSIS — S0033XA Contusion of nose, initial encounter: Secondary | ICD-10-CM | POA: Diagnosis not present

## 2023-06-25 DIAGNOSIS — M5489 Other dorsalgia: Secondary | ICD-10-CM | POA: Diagnosis not present

## 2023-06-25 DIAGNOSIS — I517 Cardiomegaly: Secondary | ICD-10-CM | POA: Diagnosis not present

## 2023-06-25 DIAGNOSIS — Z89422 Acquired absence of other left toe(s): Secondary | ICD-10-CM | POA: Diagnosis not present

## 2023-06-25 DIAGNOSIS — S50812A Abrasion of left forearm, initial encounter: Secondary | ICD-10-CM | POA: Diagnosis not present

## 2023-06-25 DIAGNOSIS — Z9181 History of falling: Secondary | ICD-10-CM | POA: Diagnosis not present

## 2023-06-25 DIAGNOSIS — R220 Localized swelling, mass and lump, head: Secondary | ICD-10-CM | POA: Diagnosis not present

## 2023-06-30 DIAGNOSIS — I69390 Apraxia following cerebral infarction: Secondary | ICD-10-CM | POA: Diagnosis not present

## 2023-06-30 DIAGNOSIS — I5030 Unspecified diastolic (congestive) heart failure: Secondary | ICD-10-CM | POA: Diagnosis not present

## 2023-06-30 DIAGNOSIS — N189 Chronic kidney disease, unspecified: Secondary | ICD-10-CM | POA: Diagnosis not present

## 2023-06-30 DIAGNOSIS — Z9884 Bariatric surgery status: Secondary | ICD-10-CM | POA: Diagnosis not present

## 2023-06-30 DIAGNOSIS — E1122 Type 2 diabetes mellitus with diabetic chronic kidney disease: Secondary | ICD-10-CM | POA: Diagnosis not present

## 2023-06-30 DIAGNOSIS — S50812D Abrasion of left forearm, subsequent encounter: Secondary | ICD-10-CM | POA: Diagnosis not present

## 2023-06-30 DIAGNOSIS — E1140 Type 2 diabetes mellitus with diabetic neuropathy, unspecified: Secondary | ICD-10-CM | POA: Diagnosis not present

## 2023-06-30 DIAGNOSIS — D519 Vitamin B12 deficiency anemia, unspecified: Secondary | ICD-10-CM | POA: Diagnosis not present

## 2023-06-30 DIAGNOSIS — M5489 Other dorsalgia: Secondary | ICD-10-CM | POA: Diagnosis not present

## 2023-06-30 DIAGNOSIS — M810 Age-related osteoporosis without current pathological fracture: Secondary | ICD-10-CM | POA: Diagnosis not present

## 2023-06-30 DIAGNOSIS — Z89422 Acquired absence of other left toe(s): Secondary | ICD-10-CM | POA: Diagnosis not present

## 2023-06-30 DIAGNOSIS — I130 Hypertensive heart and chronic kidney disease with heart failure and stage 1 through stage 4 chronic kidney disease, or unspecified chronic kidney disease: Secondary | ICD-10-CM | POA: Diagnosis not present

## 2023-06-30 DIAGNOSIS — Z8631 Personal history of diabetic foot ulcer: Secondary | ICD-10-CM | POA: Diagnosis not present

## 2023-06-30 DIAGNOSIS — R296 Repeated falls: Secondary | ICD-10-CM | POA: Diagnosis not present

## 2023-06-30 DIAGNOSIS — Z7901 Long term (current) use of anticoagulants: Secondary | ICD-10-CM | POA: Diagnosis not present

## 2023-06-30 DIAGNOSIS — E039 Hypothyroidism, unspecified: Secondary | ICD-10-CM | POA: Diagnosis not present

## 2023-06-30 DIAGNOSIS — S0012XD Contusion of left eyelid and periocular area, subsequent encounter: Secondary | ICD-10-CM | POA: Diagnosis not present

## 2023-06-30 DIAGNOSIS — Z4781 Encounter for orthopedic aftercare following surgical amputation: Secondary | ICD-10-CM | POA: Diagnosis not present

## 2023-06-30 DIAGNOSIS — S0033XD Contusion of nose, subsequent encounter: Secondary | ICD-10-CM | POA: Diagnosis not present

## 2023-06-30 DIAGNOSIS — G4730 Sleep apnea, unspecified: Secondary | ICD-10-CM | POA: Diagnosis not present

## 2023-06-30 DIAGNOSIS — E113593 Type 2 diabetes mellitus with proliferative diabetic retinopathy without macular edema, bilateral: Secondary | ICD-10-CM | POA: Diagnosis not present

## 2023-07-09 DIAGNOSIS — S0033XD Contusion of nose, subsequent encounter: Secondary | ICD-10-CM | POA: Diagnosis not present

## 2023-07-09 DIAGNOSIS — Z4781 Encounter for orthopedic aftercare following surgical amputation: Secondary | ICD-10-CM | POA: Diagnosis not present

## 2023-07-09 DIAGNOSIS — R296 Repeated falls: Secondary | ICD-10-CM | POA: Diagnosis not present

## 2023-07-09 DIAGNOSIS — Z8631 Personal history of diabetic foot ulcer: Secondary | ICD-10-CM | POA: Diagnosis not present

## 2023-07-09 DIAGNOSIS — S0012XD Contusion of left eyelid and periocular area, subsequent encounter: Secondary | ICD-10-CM | POA: Diagnosis not present

## 2023-07-09 DIAGNOSIS — Z89422 Acquired absence of other left toe(s): Secondary | ICD-10-CM | POA: Diagnosis not present

## 2023-07-10 DIAGNOSIS — Z89422 Acquired absence of other left toe(s): Secondary | ICD-10-CM | POA: Diagnosis not present

## 2023-07-10 DIAGNOSIS — S0012XD Contusion of left eyelid and periocular area, subsequent encounter: Secondary | ICD-10-CM | POA: Diagnosis not present

## 2023-07-10 DIAGNOSIS — Z8631 Personal history of diabetic foot ulcer: Secondary | ICD-10-CM | POA: Diagnosis not present

## 2023-07-10 DIAGNOSIS — S0033XD Contusion of nose, subsequent encounter: Secondary | ICD-10-CM | POA: Diagnosis not present

## 2023-07-10 DIAGNOSIS — H9192 Unspecified hearing loss, left ear: Secondary | ICD-10-CM | POA: Diagnosis not present

## 2023-07-10 DIAGNOSIS — Z4781 Encounter for orthopedic aftercare following surgical amputation: Secondary | ICD-10-CM | POA: Diagnosis not present

## 2023-07-10 DIAGNOSIS — R21 Rash and other nonspecific skin eruption: Secondary | ICD-10-CM | POA: Diagnosis not present

## 2023-07-10 DIAGNOSIS — R296 Repeated falls: Secondary | ICD-10-CM | POA: Diagnosis not present

## 2023-07-13 DIAGNOSIS — N1832 Chronic kidney disease, stage 3b: Secondary | ICD-10-CM | POA: Diagnosis not present

## 2023-07-13 DIAGNOSIS — R296 Repeated falls: Secondary | ICD-10-CM | POA: Diagnosis not present

## 2023-07-13 DIAGNOSIS — E1169 Type 2 diabetes mellitus with other specified complication: Secondary | ICD-10-CM | POA: Diagnosis not present

## 2023-07-13 DIAGNOSIS — E113493 Type 2 diabetes mellitus with severe nonproliferative diabetic retinopathy without macular edema, bilateral: Secondary | ICD-10-CM | POA: Diagnosis not present

## 2023-07-13 DIAGNOSIS — Z8631 Personal history of diabetic foot ulcer: Secondary | ICD-10-CM | POA: Diagnosis not present

## 2023-07-13 DIAGNOSIS — Z6839 Body mass index (BMI) 39.0-39.9, adult: Secondary | ICD-10-CM | POA: Diagnosis not present

## 2023-07-13 DIAGNOSIS — I5022 Chronic systolic (congestive) heart failure: Secondary | ICD-10-CM | POA: Diagnosis not present

## 2023-07-13 DIAGNOSIS — Z89422 Acquired absence of other left toe(s): Secondary | ICD-10-CM | POA: Diagnosis not present

## 2023-07-13 DIAGNOSIS — E1140 Type 2 diabetes mellitus with diabetic neuropathy, unspecified: Secondary | ICD-10-CM | POA: Diagnosis not present

## 2023-07-13 DIAGNOSIS — I10 Essential (primary) hypertension: Secondary | ICD-10-CM | POA: Diagnosis not present

## 2023-07-13 DIAGNOSIS — Z23 Encounter for immunization: Secondary | ICD-10-CM | POA: Diagnosis not present

## 2023-07-13 DIAGNOSIS — D649 Anemia, unspecified: Secondary | ICD-10-CM | POA: Diagnosis not present

## 2023-07-13 DIAGNOSIS — S0033XD Contusion of nose, subsequent encounter: Secondary | ICD-10-CM | POA: Diagnosis not present

## 2023-07-13 DIAGNOSIS — E039 Hypothyroidism, unspecified: Secondary | ICD-10-CM | POA: Diagnosis not present

## 2023-07-13 DIAGNOSIS — E1129 Type 2 diabetes mellitus with other diabetic kidney complication: Secondary | ICD-10-CM | POA: Diagnosis not present

## 2023-07-13 DIAGNOSIS — D696 Thrombocytopenia, unspecified: Secondary | ICD-10-CM | POA: Diagnosis not present

## 2023-07-13 DIAGNOSIS — S0012XD Contusion of left eyelid and periocular area, subsequent encounter: Secondary | ICD-10-CM | POA: Diagnosis not present

## 2023-07-13 DIAGNOSIS — Z4781 Encounter for orthopedic aftercare following surgical amputation: Secondary | ICD-10-CM | POA: Diagnosis not present

## 2023-07-13 DIAGNOSIS — N2581 Secondary hyperparathyroidism of renal origin: Secondary | ICD-10-CM | POA: Diagnosis not present

## 2023-07-14 DIAGNOSIS — Z4781 Encounter for orthopedic aftercare following surgical amputation: Secondary | ICD-10-CM | POA: Diagnosis not present

## 2023-07-14 DIAGNOSIS — Z8631 Personal history of diabetic foot ulcer: Secondary | ICD-10-CM | POA: Diagnosis not present

## 2023-07-14 DIAGNOSIS — S0012XD Contusion of left eyelid and periocular area, subsequent encounter: Secondary | ICD-10-CM | POA: Diagnosis not present

## 2023-07-14 DIAGNOSIS — R296 Repeated falls: Secondary | ICD-10-CM | POA: Diagnosis not present

## 2023-07-14 DIAGNOSIS — Z89422 Acquired absence of other left toe(s): Secondary | ICD-10-CM | POA: Diagnosis not present

## 2023-07-14 DIAGNOSIS — S0033XD Contusion of nose, subsequent encounter: Secondary | ICD-10-CM | POA: Diagnosis not present

## 2023-07-16 DIAGNOSIS — Z8631 Personal history of diabetic foot ulcer: Secondary | ICD-10-CM | POA: Diagnosis not present

## 2023-07-16 DIAGNOSIS — Z89422 Acquired absence of other left toe(s): Secondary | ICD-10-CM | POA: Diagnosis not present

## 2023-07-16 DIAGNOSIS — R296 Repeated falls: Secondary | ICD-10-CM | POA: Diagnosis not present

## 2023-07-16 DIAGNOSIS — S0012XD Contusion of left eyelid and periocular area, subsequent encounter: Secondary | ICD-10-CM | POA: Diagnosis not present

## 2023-07-16 DIAGNOSIS — Z4781 Encounter for orthopedic aftercare following surgical amputation: Secondary | ICD-10-CM | POA: Diagnosis not present

## 2023-07-16 DIAGNOSIS — S0033XD Contusion of nose, subsequent encounter: Secondary | ICD-10-CM | POA: Diagnosis not present

## 2023-07-20 DIAGNOSIS — Z89422 Acquired absence of other left toe(s): Secondary | ICD-10-CM | POA: Diagnosis not present

## 2023-07-20 DIAGNOSIS — Z4781 Encounter for orthopedic aftercare following surgical amputation: Secondary | ICD-10-CM | POA: Diagnosis not present

## 2023-07-20 DIAGNOSIS — S0033XD Contusion of nose, subsequent encounter: Secondary | ICD-10-CM | POA: Diagnosis not present

## 2023-07-20 DIAGNOSIS — S0012XD Contusion of left eyelid and periocular area, subsequent encounter: Secondary | ICD-10-CM | POA: Diagnosis not present

## 2023-07-20 DIAGNOSIS — R296 Repeated falls: Secondary | ICD-10-CM | POA: Diagnosis not present

## 2023-07-20 DIAGNOSIS — Z8631 Personal history of diabetic foot ulcer: Secondary | ICD-10-CM | POA: Diagnosis not present

## 2023-07-22 ENCOUNTER — Encounter: Payer: Medicare Other | Admitting: Podiatry

## 2023-07-22 DIAGNOSIS — Z89422 Acquired absence of other left toe(s): Secondary | ICD-10-CM | POA: Diagnosis not present

## 2023-07-22 DIAGNOSIS — M79672 Pain in left foot: Secondary | ICD-10-CM | POA: Diagnosis not present

## 2023-07-22 DIAGNOSIS — Z9889 Other specified postprocedural states: Secondary | ICD-10-CM | POA: Diagnosis not present

## 2023-07-23 DIAGNOSIS — Z4781 Encounter for orthopedic aftercare following surgical amputation: Secondary | ICD-10-CM | POA: Diagnosis not present

## 2023-07-23 DIAGNOSIS — R296 Repeated falls: Secondary | ICD-10-CM | POA: Diagnosis not present

## 2023-07-23 DIAGNOSIS — Z89422 Acquired absence of other left toe(s): Secondary | ICD-10-CM | POA: Diagnosis not present

## 2023-07-23 DIAGNOSIS — Z8631 Personal history of diabetic foot ulcer: Secondary | ICD-10-CM | POA: Diagnosis not present

## 2023-07-23 DIAGNOSIS — S0033XD Contusion of nose, subsequent encounter: Secondary | ICD-10-CM | POA: Diagnosis not present

## 2023-07-23 DIAGNOSIS — S0012XD Contusion of left eyelid and periocular area, subsequent encounter: Secondary | ICD-10-CM | POA: Diagnosis not present

## 2023-07-23 NOTE — Progress Notes (Signed)
Patient was a no show for scheduled appointment on 07/22/23.

## 2023-07-28 DIAGNOSIS — Z89422 Acquired absence of other left toe(s): Secondary | ICD-10-CM | POA: Diagnosis not present

## 2023-07-28 DIAGNOSIS — S0012XD Contusion of left eyelid and periocular area, subsequent encounter: Secondary | ICD-10-CM | POA: Diagnosis not present

## 2023-07-28 DIAGNOSIS — Z4781 Encounter for orthopedic aftercare following surgical amputation: Secondary | ICD-10-CM | POA: Diagnosis not present

## 2023-07-28 DIAGNOSIS — Z8631 Personal history of diabetic foot ulcer: Secondary | ICD-10-CM | POA: Diagnosis not present

## 2023-07-28 DIAGNOSIS — R296 Repeated falls: Secondary | ICD-10-CM | POA: Diagnosis not present

## 2023-07-28 DIAGNOSIS — S0033XD Contusion of nose, subsequent encounter: Secondary | ICD-10-CM | POA: Diagnosis not present

## 2023-07-29 DIAGNOSIS — Z8631 Personal history of diabetic foot ulcer: Secondary | ICD-10-CM | POA: Diagnosis not present

## 2023-07-29 DIAGNOSIS — S0033XD Contusion of nose, subsequent encounter: Secondary | ICD-10-CM | POA: Diagnosis not present

## 2023-07-29 DIAGNOSIS — S0012XD Contusion of left eyelid and periocular area, subsequent encounter: Secondary | ICD-10-CM | POA: Diagnosis not present

## 2023-07-29 DIAGNOSIS — Z89422 Acquired absence of other left toe(s): Secondary | ICD-10-CM | POA: Diagnosis not present

## 2023-07-29 DIAGNOSIS — Z4781 Encounter for orthopedic aftercare following surgical amputation: Secondary | ICD-10-CM | POA: Diagnosis not present

## 2023-07-29 DIAGNOSIS — R296 Repeated falls: Secondary | ICD-10-CM | POA: Diagnosis not present

## 2023-07-30 DIAGNOSIS — D519 Vitamin B12 deficiency anemia, unspecified: Secondary | ICD-10-CM | POA: Diagnosis not present

## 2023-07-30 DIAGNOSIS — M810 Age-related osteoporosis without current pathological fracture: Secondary | ICD-10-CM | POA: Diagnosis not present

## 2023-07-30 DIAGNOSIS — E039 Hypothyroidism, unspecified: Secondary | ICD-10-CM | POA: Diagnosis not present

## 2023-07-30 DIAGNOSIS — M5489 Other dorsalgia: Secondary | ICD-10-CM | POA: Diagnosis not present

## 2023-07-30 DIAGNOSIS — Z8631 Personal history of diabetic foot ulcer: Secondary | ICD-10-CM | POA: Diagnosis not present

## 2023-07-30 DIAGNOSIS — I69390 Apraxia following cerebral infarction: Secondary | ICD-10-CM | POA: Diagnosis not present

## 2023-07-30 DIAGNOSIS — N189 Chronic kidney disease, unspecified: Secondary | ICD-10-CM | POA: Diagnosis not present

## 2023-07-30 DIAGNOSIS — G4730 Sleep apnea, unspecified: Secondary | ICD-10-CM | POA: Diagnosis not present

## 2023-07-30 DIAGNOSIS — I130 Hypertensive heart and chronic kidney disease with heart failure and stage 1 through stage 4 chronic kidney disease, or unspecified chronic kidney disease: Secondary | ICD-10-CM | POA: Diagnosis not present

## 2023-07-30 DIAGNOSIS — E113593 Type 2 diabetes mellitus with proliferative diabetic retinopathy without macular edema, bilateral: Secondary | ICD-10-CM | POA: Diagnosis not present

## 2023-07-30 DIAGNOSIS — E1122 Type 2 diabetes mellitus with diabetic chronic kidney disease: Secondary | ICD-10-CM | POA: Diagnosis not present

## 2023-07-30 DIAGNOSIS — R296 Repeated falls: Secondary | ICD-10-CM | POA: Diagnosis not present

## 2023-07-30 DIAGNOSIS — S0033XD Contusion of nose, subsequent encounter: Secondary | ICD-10-CM | POA: Diagnosis not present

## 2023-07-30 DIAGNOSIS — Z7901 Long term (current) use of anticoagulants: Secondary | ICD-10-CM | POA: Diagnosis not present

## 2023-07-30 DIAGNOSIS — E1140 Type 2 diabetes mellitus with diabetic neuropathy, unspecified: Secondary | ICD-10-CM | POA: Diagnosis not present

## 2023-07-30 DIAGNOSIS — S0012XD Contusion of left eyelid and periocular area, subsequent encounter: Secondary | ICD-10-CM | POA: Diagnosis not present

## 2023-07-30 DIAGNOSIS — S50812D Abrasion of left forearm, subsequent encounter: Secondary | ICD-10-CM | POA: Diagnosis not present

## 2023-07-30 DIAGNOSIS — Z89422 Acquired absence of other left toe(s): Secondary | ICD-10-CM | POA: Diagnosis not present

## 2023-07-30 DIAGNOSIS — Z4781 Encounter for orthopedic aftercare following surgical amputation: Secondary | ICD-10-CM | POA: Diagnosis not present

## 2023-07-30 DIAGNOSIS — Z9884 Bariatric surgery status: Secondary | ICD-10-CM | POA: Diagnosis not present

## 2023-07-30 DIAGNOSIS — I5030 Unspecified diastolic (congestive) heart failure: Secondary | ICD-10-CM | POA: Diagnosis not present

## 2023-08-04 DIAGNOSIS — S0012XD Contusion of left eyelid and periocular area, subsequent encounter: Secondary | ICD-10-CM | POA: Diagnosis not present

## 2023-08-04 DIAGNOSIS — R296 Repeated falls: Secondary | ICD-10-CM | POA: Diagnosis not present

## 2023-08-04 DIAGNOSIS — S0033XD Contusion of nose, subsequent encounter: Secondary | ICD-10-CM | POA: Diagnosis not present

## 2023-08-04 DIAGNOSIS — Z8631 Personal history of diabetic foot ulcer: Secondary | ICD-10-CM | POA: Diagnosis not present

## 2023-08-04 DIAGNOSIS — Z89422 Acquired absence of other left toe(s): Secondary | ICD-10-CM | POA: Diagnosis not present

## 2023-08-04 DIAGNOSIS — Z4781 Encounter for orthopedic aftercare following surgical amputation: Secondary | ICD-10-CM | POA: Diagnosis not present

## 2023-08-11 DIAGNOSIS — Z89422 Acquired absence of other left toe(s): Secondary | ICD-10-CM | POA: Diagnosis not present

## 2023-08-11 DIAGNOSIS — Z4781 Encounter for orthopedic aftercare following surgical amputation: Secondary | ICD-10-CM | POA: Diagnosis not present

## 2023-08-11 DIAGNOSIS — R296 Repeated falls: Secondary | ICD-10-CM | POA: Diagnosis not present

## 2023-08-11 DIAGNOSIS — S0033XD Contusion of nose, subsequent encounter: Secondary | ICD-10-CM | POA: Diagnosis not present

## 2023-08-11 DIAGNOSIS — Z8631 Personal history of diabetic foot ulcer: Secondary | ICD-10-CM | POA: Diagnosis not present

## 2023-08-11 DIAGNOSIS — S0012XD Contusion of left eyelid and periocular area, subsequent encounter: Secondary | ICD-10-CM | POA: Diagnosis not present

## 2023-08-13 DIAGNOSIS — S0012XD Contusion of left eyelid and periocular area, subsequent encounter: Secondary | ICD-10-CM | POA: Diagnosis not present

## 2023-08-13 DIAGNOSIS — Z89422 Acquired absence of other left toe(s): Secondary | ICD-10-CM | POA: Diagnosis not present

## 2023-08-13 DIAGNOSIS — Z8631 Personal history of diabetic foot ulcer: Secondary | ICD-10-CM | POA: Diagnosis not present

## 2023-08-13 DIAGNOSIS — S0033XD Contusion of nose, subsequent encounter: Secondary | ICD-10-CM | POA: Diagnosis not present

## 2023-08-13 DIAGNOSIS — Z4781 Encounter for orthopedic aftercare following surgical amputation: Secondary | ICD-10-CM | POA: Diagnosis not present

## 2023-08-13 DIAGNOSIS — R296 Repeated falls: Secondary | ICD-10-CM | POA: Diagnosis not present

## 2023-08-19 DIAGNOSIS — Z4781 Encounter for orthopedic aftercare following surgical amputation: Secondary | ICD-10-CM | POA: Diagnosis not present

## 2023-08-19 DIAGNOSIS — S0033XD Contusion of nose, subsequent encounter: Secondary | ICD-10-CM | POA: Diagnosis not present

## 2023-08-19 DIAGNOSIS — Z8631 Personal history of diabetic foot ulcer: Secondary | ICD-10-CM | POA: Diagnosis not present

## 2023-08-19 DIAGNOSIS — S0012XD Contusion of left eyelid and periocular area, subsequent encounter: Secondary | ICD-10-CM | POA: Diagnosis not present

## 2023-08-19 DIAGNOSIS — Z89422 Acquired absence of other left toe(s): Secondary | ICD-10-CM | POA: Diagnosis not present

## 2023-08-19 DIAGNOSIS — R296 Repeated falls: Secondary | ICD-10-CM | POA: Diagnosis not present

## 2023-08-21 ENCOUNTER — Other Ambulatory Visit: Payer: Self-pay | Admitting: Cardiology

## 2023-08-21 DIAGNOSIS — Z89422 Acquired absence of other left toe(s): Secondary | ICD-10-CM | POA: Diagnosis not present

## 2023-08-21 DIAGNOSIS — R296 Repeated falls: Secondary | ICD-10-CM | POA: Diagnosis not present

## 2023-08-21 DIAGNOSIS — S0033XD Contusion of nose, subsequent encounter: Secondary | ICD-10-CM | POA: Diagnosis not present

## 2023-08-21 DIAGNOSIS — Z4781 Encounter for orthopedic aftercare following surgical amputation: Secondary | ICD-10-CM | POA: Diagnosis not present

## 2023-08-21 DIAGNOSIS — S0012XD Contusion of left eyelid and periocular area, subsequent encounter: Secondary | ICD-10-CM | POA: Diagnosis not present

## 2023-08-21 DIAGNOSIS — Z8631 Personal history of diabetic foot ulcer: Secondary | ICD-10-CM | POA: Diagnosis not present

## 2023-08-25 DIAGNOSIS — S0033XD Contusion of nose, subsequent encounter: Secondary | ICD-10-CM | POA: Diagnosis not present

## 2023-08-25 DIAGNOSIS — Z89422 Acquired absence of other left toe(s): Secondary | ICD-10-CM | POA: Diagnosis not present

## 2023-08-25 DIAGNOSIS — Z4781 Encounter for orthopedic aftercare following surgical amputation: Secondary | ICD-10-CM | POA: Diagnosis not present

## 2023-08-25 DIAGNOSIS — R296 Repeated falls: Secondary | ICD-10-CM | POA: Diagnosis not present

## 2023-08-25 DIAGNOSIS — S0012XD Contusion of left eyelid and periocular area, subsequent encounter: Secondary | ICD-10-CM | POA: Diagnosis not present

## 2023-08-25 DIAGNOSIS — Z8631 Personal history of diabetic foot ulcer: Secondary | ICD-10-CM | POA: Diagnosis not present

## 2023-08-26 ENCOUNTER — Ambulatory Visit (INDEPENDENT_AMBULATORY_CARE_PROVIDER_SITE_OTHER): Payer: Medicare Other | Admitting: Podiatry

## 2023-08-26 DIAGNOSIS — Z89422 Acquired absence of other left toe(s): Secondary | ICD-10-CM

## 2023-08-26 DIAGNOSIS — B351 Tinea unguium: Secondary | ICD-10-CM | POA: Diagnosis not present

## 2023-08-26 DIAGNOSIS — M79675 Pain in left toe(s): Secondary | ICD-10-CM

## 2023-08-26 DIAGNOSIS — M79674 Pain in right toe(s): Secondary | ICD-10-CM

## 2023-08-26 DIAGNOSIS — Z89421 Acquired absence of other right toe(s): Secondary | ICD-10-CM

## 2023-08-26 DIAGNOSIS — E1151 Type 2 diabetes mellitus with diabetic peripheral angiopathy without gangrene: Secondary | ICD-10-CM

## 2023-08-26 NOTE — Progress Notes (Signed)
       Subjective:  Patient ID: Debra Mcconnell, female    DOB: 1946/06/18,  MRN: [id number]  Debra Mcconnell presents to clinic today for:  Chief Complaint  Patient presents with   [facility]    [facility]  NOT SURE A1C FBS 160 ELIQUIS   . Patient notes nails are thick and elongated, causing pain in shoe gear when ambulating.  Patient has had third toes previously amputated.  PCP is Alinda Deem, MD. last seen within the past 2 months.  Past Medical History:  Diagnosis Date   Acquired hammer toes of both feet 01/22/2017   Arthritis 11/29/2013   Atrial flutter (HCC) 01/24/2014   Overview:  CHADS2 vasc score= 5   B-complex deficiency 11/29/2013   Biventricular failure (HCC)    Cerebral artery occlusion with cerebral infarction (HCC) 12/04/2013   Overview:  STORY: MRI +ve Left frontoparietal infarction (LMCA). +AFib.`E1o3L`IMPRESSION: BP is under controlled. Continue home health. +AFib, con't Eliquis 5mg  bid. Will obtain old record from [facility] for review.   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 female in NAD. AAO x 3.  Vascular Examination: Patient has palpable DP pulse, absent PT pulse bilateral.  Delayed capillary refill bilateral toes.  Sparse digital hair bilateral.  Proximal to distal cooling WNL bilateral.    Dermatological Examination: Interspaces are  clear with no open lesions noted bilateral.  Skin is shiny and atrophic bilateral.  Nails are 3-35mm thick, with yellowish/brown discoloration, subungual debris and distal onycholysis x 8.  There is pain with compression of nails x 8.    Neurological Examination: Diminished protective sensation intact b/l LE.   Musculoskeletal Examination: Bilateral third toes have previously been amputated  Patient qualifies for at-risk foot care because of nontraumatic amputation of bilateral third toe, diabetes with PVD.  Assessment/Plan: 1. Pain due to onychomycosis of toenails of both feet   2. Type II diabetes mellitus with peripheral circulatory disorder (HCC)   3. Status post amputation of lesser toe, right (HCC)   4. Status post amputation of lesser toe of left foot (HCC)     Mycotic nails x 8 were sharply debrided with sterile nail nippers and power debriding burr to decrease bulk and length.  Return in about 3 months (around 11/24/2023) for [facility].   Clerance Lav, DPM, FACFAS [facility]     [address] [place], [place] [address]                Office [phone]  Fax [phone]

## 2023-08-27 ENCOUNTER — Telehealth: Payer: Self-pay | Admitting: Cardiology

## 2023-08-27 ENCOUNTER — Encounter: Payer: Self-pay | Admitting: Cardiology

## 2023-08-27 NOTE — Telephone Encounter (Signed)
*  STAT* If patient is at the pharmacy, call can be transferred to refill team.   1. Which medications need to be refilled? (please list name of each medication and dose if known) dapagliflozin propanediol (FARXIGA) 10 MG TABS tablet   2. Which pharmacy/location (including street and city if local pharmacy) is medication to be sent to? [facility] [place], [place] - [address] [facility] AT Portal to Registered [facility] Sites   3. Do they need a 30 day or 90 day supply? 90

## 2023-08-28 DIAGNOSIS — Z4781 Encounter for orthopedic aftercare following surgical amputation: Secondary | ICD-10-CM | POA: Diagnosis not present

## 2023-08-28 DIAGNOSIS — R296 Repeated falls: Secondary | ICD-10-CM | POA: Diagnosis not present

## 2023-08-28 DIAGNOSIS — Z8631 Personal history of diabetic foot ulcer: Secondary | ICD-10-CM | POA: Diagnosis not present

## 2023-08-28 DIAGNOSIS — Z89422 Acquired absence of other left toe(s): Secondary | ICD-10-CM | POA: Diagnosis not present

## 2023-08-28 DIAGNOSIS — S0012XD Contusion of left eyelid and periocular area, subsequent encounter: Secondary | ICD-10-CM | POA: Diagnosis not present

## 2023-08-28 DIAGNOSIS — S0033XD Contusion of nose, subsequent encounter: Secondary | ICD-10-CM | POA: Diagnosis not present

## 2023-08-29 DIAGNOSIS — D519 Vitamin B12 deficiency anemia, unspecified: Secondary | ICD-10-CM | POA: Diagnosis not present

## 2023-08-29 DIAGNOSIS — N189 Chronic kidney disease, unspecified: Secondary | ICD-10-CM | POA: Diagnosis not present

## 2023-08-29 DIAGNOSIS — I69390 Apraxia following cerebral infarction: Secondary | ICD-10-CM | POA: Diagnosis not present

## 2023-08-29 DIAGNOSIS — R296 Repeated falls: Secondary | ICD-10-CM | POA: Diagnosis not present

## 2023-08-29 DIAGNOSIS — M810 Age-related osteoporosis without current pathological fracture: Secondary | ICD-10-CM | POA: Diagnosis not present

## 2023-08-29 DIAGNOSIS — G4730 Sleep apnea, unspecified: Secondary | ICD-10-CM | POA: Diagnosis not present

## 2023-08-29 DIAGNOSIS — Z7901 Long term (current) use of anticoagulants: Secondary | ICD-10-CM | POA: Diagnosis not present

## 2023-08-29 DIAGNOSIS — Z8631 Personal history of diabetic foot ulcer: Secondary | ICD-10-CM | POA: Diagnosis not present

## 2023-08-29 DIAGNOSIS — Z4781 Encounter for orthopedic aftercare following surgical amputation: Secondary | ICD-10-CM | POA: Diagnosis not present

## 2023-08-29 DIAGNOSIS — E039 Hypothyroidism, unspecified: Secondary | ICD-10-CM | POA: Diagnosis not present

## 2023-08-29 DIAGNOSIS — M5489 Other dorsalgia: Secondary | ICD-10-CM | POA: Diagnosis not present

## 2023-08-29 DIAGNOSIS — Z9884 Bariatric surgery status: Secondary | ICD-10-CM | POA: Diagnosis not present

## 2023-08-29 DIAGNOSIS — E1122 Type 2 diabetes mellitus with diabetic chronic kidney disease: Secondary | ICD-10-CM | POA: Diagnosis not present

## 2023-08-29 DIAGNOSIS — Z89422 Acquired absence of other left toe(s): Secondary | ICD-10-CM | POA: Diagnosis not present

## 2023-08-29 DIAGNOSIS — E113593 Type 2 diabetes mellitus with proliferative diabetic retinopathy without macular edema, bilateral: Secondary | ICD-10-CM | POA: Diagnosis not present

## 2023-08-29 DIAGNOSIS — I130 Hypertensive heart and chronic kidney disease with heart failure and stage 1 through stage 4 chronic kidney disease, or unspecified chronic kidney disease: Secondary | ICD-10-CM | POA: Diagnosis not present

## 2023-08-29 DIAGNOSIS — I5030 Unspecified diastolic (congestive) heart failure: Secondary | ICD-10-CM | POA: Diagnosis not present

## 2023-08-29 DIAGNOSIS — E1140 Type 2 diabetes mellitus with diabetic neuropathy, unspecified: Secondary | ICD-10-CM | POA: Diagnosis not present

## 2023-08-30 NOTE — Progress Notes (Signed)
 Cardiology Office Note:  .   Date:  08/31/2023  ID:  Debra Mcconnell, DOB 13-Jun-1946, MRN [id number] PCP: Alinda Deem, MD  [facility] Providers Cardiologist:  Norman Herrlich, MD    History of Present Illness: Debra Mcconnell is a 77 y.o. female with a past medical history of atrial flutter, history of stroke, hypertension, HFrEF, OSA, DM2, CKD, dyslipidemia, severe mitral regurgitation.  11/19/2021 monitor atrial flutter occurred continuously, heart rate ranging 38 to 93 bpm, VE's were occasional at 3.2% 10/14/2021 ABI indexes were normal 02/19/2021 echo EF 50 to 55%, moderate left ventricular thickening, moderately elevated PASP, LA severely dilated, severe MR  Recently she was evaluated by Dr. Dulce Sellar on 02/12/2023, she was stable from a cardiac perspective, she was advised she can follow-up in 6 months.  She presents today accompanied by friend for follow-up of her atrial flutter and HFrEF.  She appears to be doing okay from a cardiac perspective, there has been some confusion around getting her medication refills, but she has been taking them as prescribed.  She offers no formal complaints.  She does have some pedal edema however she says this is stable and unchanged for her.  He is tolerating her Eliquis without hematochezia, hematuria, hemoptysis. She denies chest pain, palpitations, dyspnea, pnd, orthopnea, n, v, dizziness, syncope, weight gain, or early satiety.   ROS: Review of Systems  Cardiovascular:  Positive for leg swelling.  Musculoskeletal:  Positive for falls.  Endo/Heme/Allergies:  Bruises/bleeds easily.  All other systems reviewed and are negative.    Studies Reviewed: .        Cardiac Studies & Procedures      ECHOCARDIOGRAM  ECHOCARDIOGRAM COMPLETE 02/19/2021  Narrative ECHOCARDIOGRAM REPORT    Patient Name:   Debra Mcconnell Date of Exam: 02/19/2021 Medical Rec #:  [id number]     Height:       68.0 in Accession #:    [id number]    Weight:       217.0  lb Date of Birth:  1946/09/04    BSA:          2.116 m Patient Age:    74 years      BP:           120/90 mmHg Patient Gender: F             HR:           62 bpm. Exam Location:  [facility]  Procedure: 2D Echo  Indications:    Atrial flutter (HCC) [427.32.ICD-9-CM  History:        Patient has prior history of Echocardiogram examinations, most recent 10/06/2019. 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  Sonographer:    Louie Boston Referring Phys: [id number] BRIAN J MUNLEY  IMPRESSIONS   1. Left ventricular ejection fraction, by estimation, is 50 to 55%. The left ventricle has low normal function. The left ventricle has no regional wall motion abnormalities. There is moderate left ventricular hypertrophy. 2. Right ventricular systolic function is normal. The right ventricular size is normal. There is moderately elevated pulmonary artery systolic pressure. 3. Left atrial size was severely dilated. 4. The mitral valve is normal in structure. Severe mitral valve regurgitation. No evidence of mitral stenosis. 5. The aortic valve is normal in structure. Aortic valve regurgitation is not visualized. No aortic stenosis is present. 6. The inferior vena cava is normal in size with greater than 50% respiratory variability, suggesting right atrial pressure  of 3 mmHg.  FINDINGS Left Ventricle: Left ventricular ejection fraction, by estimation, is 50 to 55%. The left ventricle has low normal function. The left ventricle has no regional wall motion abnormalities. The left ventricular internal cavity size was normal in size. There is moderate left ventricular hypertrophy. Left ventricular diastolic parameters are indeterminate.  Right Ventricle: The right ventricular size is normal. No increase in right ventricular wall thickness. Right ventricular systolic function is normal. There is moderately elevated pulmonary artery systolic pressure. The  tricuspid regurgitant velocity is 3.35 m/s, and with an assumed right atrial pressure of 8 mmHg, the estimated right ventricular systolic pressure is 52.9 mmHg.  Left Atrium: Left atrial size was severely dilated.  Right Atrium: Right atrial size was normal in size.  Pericardium: There is no evidence of pericardial effusion.  Mitral Valve: The mitral valve is normal in structure. Severe mitral valve regurgitation. No evidence of mitral valve stenosis.  Tricuspid Valve: The tricuspid valve is normal in structure. Tricuspid valve regurgitation is not demonstrated. No evidence of tricuspid stenosis.  Aortic Valve: The aortic valve is normal in structure. Aortic valve regurgitation is not visualized. No aortic stenosis is present.  Pulmonic Valve: The pulmonic valve was normal in structure. Pulmonic valve regurgitation is not visualized. No evidence of pulmonic stenosis.  Aorta: The aortic root is normal in size and structure.  Venous: The inferior vena cava is normal in size with greater than 50% respiratory variability, suggesting right atrial pressure of 3 mmHg.  IAS/Shunts: No atrial level shunt detected by color flow Doppler.   LEFT VENTRICLE PLAX 2D LVIDd:         5.70 cm      Diastology LVIDs:         4.50 cm      LV e' medial:    4.73 cm/s LV PW:         1.60 cm      LV E/e' medial:  26.4 LV IVS:        1.60 cm      LV e' lateral:   9.23 cm/s LVOT diam:     2.00 cm      LV E/e' lateral: 13.5 LV SV:         41 LV SV Index:   19 LVOT Area:     3.14 cm  LV Volumes (MOD) LV vol d, MOD A2C: 101.0 ml LV vol d, MOD A4C: 107.0 ml LV vol s, MOD A2C: 60.6 ml LV vol s, MOD A4C: 62.4 ml LV SV MOD A2C:     40.4 ml LV SV MOD A4C:     107.0 ml LV SV MOD BP:      41.2 ml  RIGHT VENTRICLE            IVC RV S prime:     6.25 cm/s  IVC diam: 2.50 cm TAPSE (M-mode): 1.2 cm  LEFT ATRIUM              Index       RIGHT ATRIUM           Index LA diam:        6.20 cm  2.93 cm/m  RA  Area:     21.00 cm LA Vol (A2C):   128.0 ml 60.50 ml/m RA Volume:   63.30 ml  29.92 ml/m LA Vol (A4C):   114.0 ml 53.88 ml/m LA Biplane Vol: 121.0 ml 57.19 ml/m AORTIC VALVE LVOT Vmax:   61.40 cm/s  LVOT Vmean:  41.400 cm/s LVOT VTI:    0.131 m  AORTA Ao Root diam: 2.50 cm Ao Asc diam:  3.40 cm Ao Desc diam: 2.20 cm  MITRAL VALVE                 TRICUSPID VALVE MV Area (PHT): 5.06 cm      TR Peak grad:   44.9 mmHg MV Decel Time: 150 msec      TR Vmax:        335.00 cm/s MR Peak grad:    101.6 mmHg MR Mean grad:    65.0 mmHg   SHUNTS MR Vmax:         504.00 cm/s Systemic VTI:  0.13 m MR Vmean:        383.0 cm/s  Systemic Diam: 2.00 cm MR PISA:         2.65 cm MR PISA Eff ROA: 17 mm MR PISA Radius:  0.65 cm MV E velocity: 125.00 cm/s MV A velocity: 47.60 cm/s MV E/A ratio:  2.63  Glean Hess Revankar MD Electronically signed by Belva Crome MD Signature Date/Time: 02/19/2021/5:18:52 PM    Final   MONITORS  LONG TERM MONITOR (3-14 DAYS) 11/18/2021  Narrative Patch Wear Time:  3 days and 0 hours (2023-03-01T12:05:37-0500 to 2023-03-04T12:42:03-499)  Atrial Flutter occurred continuously (100% burden), ranging from 38-93 bpm (avg of 64 bpm). Bundle Branch Block/IVCD was present.  Good heart rate control heart rate in the range 99% of the time daytime 97% time nighttime 50 to 110 bpm and no pauses of greater than 3 seconds or sustained bradycardia.  Isolated VEs were occasional (3.2%, 7925), VE  Couplets were rare (<1.0%, 64), and no VE Triplets were present. Ventricular Bigeminy and Trigeminy were present.  Conclusion atrial fibrillation/flutter with good heart rate control: Occasional PVCs.  There were no symptomatic events.           Risk Assessment/Calculations:    CHA2DS2-VASc Score = 8   This indicates a 10.8% annual risk of stroke. The patient's score is based upon: CHF History: 1 HTN History: 1 Diabetes History: 1 Stroke History: 2 Vascular Disease  History: 0 Age Score: 2 Gender Score: 1            Physical Exam:   VS:  BP (!) 116/58   Pulse (!) 57   Ht 5\' 5"  (1.651 m)   Wt 217 lb 9.6 oz (98.7 kg)   SpO2 97%   BMI 36.21 kg/m    Wt Readings from Last 3 Encounters:  08/31/23 217 lb 9.6 oz (98.7 kg)  02/12/23 222 lb 3.2 oz (100.8 kg)  05/09/22 216 lb 6.4 oz (98.2 kg)    GEN: Well nourished, well developed in no acute distress NECK: No JVD; No carotid bruits CARDIAC: Irregularly irregular, 2/6 systolic murmur, no rubs, no gallops RESPIRATORY:  Clear to auscultation without rales, wheezing or rhonchi  ABDOMEN: Soft, non-tender, non-distended EXTREMITIES: Nonpitting edema, no deformity   ASSESSMENT AND PLAN: .   Atrial flutter/hypercoagulable state-CHA2DS2-VASc score of 8, rate is controlled, continue Eliquis 5 mg twice daily--no indication for dose reduction we will repeat CBC and BMET.  Continue carvedilol 25 mg twice daily.  Severe mitral regurgitation/DOE-2/6 systolic murmur noted, will repeat echocardiogram.  HFrEF-NYHA class II-III, euvolemic.  Continue Coreg 25 mg twice daily, continue Farxiga 10 mg daily, continue Lasix 20 mg twice daily, continue Entresto 49-51 mg twice daily.  She is currently out of [place], we will provide her with some samples.  Hypertension-blood pressure is well-controlled today at 116/58, continue Coreg 25 mg twice daily, continue Apresoline 25 mg 3 times daily, continue Entresto 49-50 mg twice daily  CKD -currently on Kerendia, careful titration of antihypertensives and diuretics.    Dispo: CBC, BMET, repeat echocardiogram, 21-month follow-up with Dr. Dulce Sellar.  Signed, Flossie Dibble, NP

## 2023-08-31 ENCOUNTER — Other Ambulatory Visit: Payer: Self-pay

## 2023-08-31 ENCOUNTER — Ambulatory Visit: Payer: Medicare Other | Attending: Cardiology | Admitting: Cardiology

## 2023-08-31 ENCOUNTER — Encounter: Payer: Self-pay | Admitting: Cardiology

## 2023-08-31 VITALS — BP 116/58 | HR 57 | Ht 65.0 in | Wt 217.6 lb

## 2023-08-31 DIAGNOSIS — I340 Nonrheumatic mitral (valve) insufficiency: Secondary | ICD-10-CM

## 2023-08-31 DIAGNOSIS — I639 Cerebral infarction, unspecified: Secondary | ICD-10-CM

## 2023-08-31 DIAGNOSIS — N1832 Chronic kidney disease, stage 3b: Secondary | ICD-10-CM

## 2023-08-31 DIAGNOSIS — I4892 Unspecified atrial flutter: Secondary | ICD-10-CM | POA: Diagnosis present

## 2023-08-31 DIAGNOSIS — I5020 Unspecified systolic (congestive) heart failure: Secondary | ICD-10-CM | POA: Diagnosis present

## 2023-08-31 DIAGNOSIS — I10 Essential (primary) hypertension: Secondary | ICD-10-CM | POA: Diagnosis present

## 2023-08-31 DIAGNOSIS — R0609 Other forms of dyspnea: Secondary | ICD-10-CM

## 2023-08-31 DIAGNOSIS — G4733 Obstructive sleep apnea (adult) (pediatric): Secondary | ICD-10-CM | POA: Diagnosis present

## 2023-08-31 MED ORDER — DAPAGLIFLOZIN PROPANEDIOL 10 MG PO TABS: 10.0000 mg | ORAL_TABLET | Freq: Every day | ORAL | Status: AC

## 2023-08-31 MED ORDER — DAPAGLIFLOZIN PROPANEDIOL 10 MG PO TABS: 10.0000 mg | ORAL_TABLET | Freq: Every day | ORAL | 2 refills | Status: AC

## 2023-08-31 NOTE — Patient Instructions (Signed)
Medication Instructions:  Your physician recommends that you continue on your current medications as directed. Please refer to the Current Medication list given to you today.  *If you need a refill on your cardiac medications before your next appointment, please call your pharmacy*   Lab Work: Your physician recommends that you return for lab work in:   Labs today: BMP, CBC  If you have labs (blood work) drawn today and your tests are completely normal, you will receive your results only by: MyChart Message (if you have MyChart) OR A paper copy in the mail If you have any lab test that is abnormal or we need to change your treatment, we will call you to review the results.   Testing/Procedures: Your physician has requested that you have an echocardiogram. Echocardiography is a painless test that uses sound waves to create images of your heart. It provides your doctor with information about the size and shape of your heart and how well your heart's chambers and valves are working. This procedure takes approximately one hour. There are no restrictions for this procedure. Please do NOT wear cologne, perfume, aftershave, or lotions (deodorant is allowed). Please arrive 15 minutes prior to your appointment time.  Please note: We ask at that you not bring children with you during ultrasound (echo/ vascular) testing. Due to room size and safety concerns, children are not allowed in the ultrasound rooms during exams. Our front office staff cannot provide observation of children in our lobby area while testing is being conducted. An adult accompanying a patient to their appointment will only be allowed in the ultrasound room at the discretion of the ultrasound technician under special circumstances. We apologize for any inconvenience.    Follow-Up: At [facility] you and your health needs are our priority.  As part of our continuing mission to provide you with exceptional heart care, we  have created designated Provider Care Teams.  These Care Teams include your primary Cardiologist (physician) and Advanced Practice Providers (APPs -  Physician Assistants and Nurse Practitioners) who all work together to provide you with the care you need, when you need it.  We recommend signing up for the patient portal called "MyChart".  Sign up information is provided on this After Visit Summary.  MyChart is used to connect with patients for Virtual Visits (Telemedicine).  Patients are able to view lab/test results, encounter notes, upcoming appointments, etc.  Non-urgent messages can be sent to your provider as well.   To learn more about what you can do with MyChart, go to [email or web address].    Your next appointment:   6 month(s)  Provider:   Norman Herrlich, MD    Other Instructions None

## 2023-08-31 NOTE — Telephone Encounter (Signed)
Prescription sent to pharmacy.

## 2023-08-31 NOTE — Telephone Encounter (Signed)
Patient had an appointment with Debra Mcconnell today and her [name] was re-filled. Patient had no further questions at this time.

## 2023-09-01 ENCOUNTER — Telehealth: Payer: Self-pay | Admitting: Emergency Medicine

## 2023-09-01 DIAGNOSIS — I130 Hypertensive heart and chronic kidney disease with heart failure and stage 1 through stage 4 chronic kidney disease, or unspecified chronic kidney disease: Secondary | ICD-10-CM | POA: Diagnosis not present

## 2023-09-01 DIAGNOSIS — M5489 Other dorsalgia: Secondary | ICD-10-CM | POA: Diagnosis not present

## 2023-09-01 DIAGNOSIS — Z89422 Acquired absence of other left toe(s): Secondary | ICD-10-CM | POA: Diagnosis not present

## 2023-09-01 DIAGNOSIS — Z8631 Personal history of diabetic foot ulcer: Secondary | ICD-10-CM | POA: Diagnosis not present

## 2023-09-01 DIAGNOSIS — Z4781 Encounter for orthopedic aftercare following surgical amputation: Secondary | ICD-10-CM | POA: Diagnosis not present

## 2023-09-01 DIAGNOSIS — R296 Repeated falls: Secondary | ICD-10-CM | POA: Diagnosis not present

## 2023-09-01 LAB — BASIC METABOLIC PANEL WITH GFR
BUN/Creatinine Ratio: 34 — ABNORMAL HIGH (ref 12–28)
BUN: 66 mg/dL — ABNORMAL HIGH (ref 8–27)
CO2: 18 mmol/L — ABNORMAL LOW (ref 20–29)
Calcium: 9 mg/dL (ref 8.7–10.3)
Chloride: 105 mmol/L (ref 96–106)
Creatinine, Ser: 1.92 mg/dL — ABNORMAL HIGH (ref 0.57–1.00)
Glucose: 134 mg/dL — ABNORMAL HIGH (ref 70–99)
Potassium: 4.9 mmol/L (ref 3.5–5.2)
Sodium: 139 mmol/L (ref 134–144)
eGFR: 27 mL/min/1.73 — ABNORMAL LOW

## 2023-09-01 LAB — CBC
Hematocrit: 35.2 % (ref 34.0–46.6)
Hemoglobin: 11.1 g/dL (ref 11.1–15.9)
MCH: 33.3 pg — ABNORMAL HIGH (ref 26.6–33.0)
MCHC: 31.5 g/dL (ref 31.5–35.7)
MCV: 106 fL — ABNORMAL HIGH (ref 79–97)
Platelets: 143 x10E3/uL — ABNORMAL LOW (ref 150–450)
RBC: 3.33 x10E6/uL — ABNORMAL LOW (ref 3.77–5.28)
RDW: 13 % (ref 11.7–15.4)
WBC: 5.1 x10E3/uL (ref 3.4–10.8)

## 2023-09-01 NOTE — Telephone Encounter (Signed)
-----   Message from Flossie Dibble sent at 09/01/2023  7:55 AM EST ----- Slight increase in your kidney dysfunction.  Make sure you are drinking enough water, at least 48 ounces per day.  Can we please find out who her nephrologist is and I will send the results to their office?

## 2023-09-01 NOTE — Telephone Encounter (Signed)
Spoke to [employer] per DPR. Reviewed results and she verbalized understanding. She stated that the patient does not have a nephrologist that the patients PCP monitors her labs. Results routed to PCP.

## 2023-09-07 DIAGNOSIS — R296 Repeated falls: Secondary | ICD-10-CM | POA: Diagnosis not present

## 2023-09-07 DIAGNOSIS — Z8631 Personal history of diabetic foot ulcer: Secondary | ICD-10-CM | POA: Diagnosis not present

## 2023-09-07 DIAGNOSIS — Z4781 Encounter for orthopedic aftercare following surgical amputation: Secondary | ICD-10-CM | POA: Diagnosis not present

## 2023-09-07 DIAGNOSIS — Z89422 Acquired absence of other left toe(s): Secondary | ICD-10-CM | POA: Diagnosis not present

## 2023-09-07 DIAGNOSIS — I130 Hypertensive heart and chronic kidney disease with heart failure and stage 1 through stage 4 chronic kidney disease, or unspecified chronic kidney disease: Secondary | ICD-10-CM | POA: Diagnosis not present

## 2023-09-07 DIAGNOSIS — M5489 Other dorsalgia: Secondary | ICD-10-CM | POA: Diagnosis not present

## 2023-09-08 DIAGNOSIS — E1122 Type 2 diabetes mellitus with diabetic chronic kidney disease: Secondary | ICD-10-CM | POA: Diagnosis not present

## 2023-09-08 DIAGNOSIS — I130 Hypertensive heart and chronic kidney disease with heart failure and stage 1 through stage 4 chronic kidney disease, or unspecified chronic kidney disease: Secondary | ICD-10-CM | POA: Diagnosis not present

## 2023-09-08 DIAGNOSIS — M5489 Other dorsalgia: Secondary | ICD-10-CM | POA: Diagnosis not present

## 2023-09-08 DIAGNOSIS — Z8631 Personal history of diabetic foot ulcer: Secondary | ICD-10-CM | POA: Diagnosis not present

## 2023-09-08 DIAGNOSIS — Z89422 Acquired absence of other left toe(s): Secondary | ICD-10-CM | POA: Diagnosis not present

## 2023-09-08 DIAGNOSIS — I5030 Unspecified diastolic (congestive) heart failure: Secondary | ICD-10-CM | POA: Diagnosis not present

## 2023-09-08 DIAGNOSIS — R296 Repeated falls: Secondary | ICD-10-CM | POA: Diagnosis not present

## 2023-09-08 DIAGNOSIS — N189 Chronic kidney disease, unspecified: Secondary | ICD-10-CM | POA: Diagnosis not present

## 2023-09-08 DIAGNOSIS — E1140 Type 2 diabetes mellitus with diabetic neuropathy, unspecified: Secondary | ICD-10-CM | POA: Diagnosis not present

## 2023-09-08 DIAGNOSIS — Z4781 Encounter for orthopedic aftercare following surgical amputation: Secondary | ICD-10-CM | POA: Diagnosis not present

## 2023-09-10 DIAGNOSIS — M5489 Other dorsalgia: Secondary | ICD-10-CM | POA: Diagnosis not present

## 2023-09-10 DIAGNOSIS — R296 Repeated falls: Secondary | ICD-10-CM | POA: Diagnosis not present

## 2023-09-10 DIAGNOSIS — Z89422 Acquired absence of other left toe(s): Secondary | ICD-10-CM | POA: Diagnosis not present

## 2023-09-10 DIAGNOSIS — Z8631 Personal history of diabetic foot ulcer: Secondary | ICD-10-CM | POA: Diagnosis not present

## 2023-09-10 DIAGNOSIS — I130 Hypertensive heart and chronic kidney disease with heart failure and stage 1 through stage 4 chronic kidney disease, or unspecified chronic kidney disease: Secondary | ICD-10-CM | POA: Diagnosis not present

## 2023-09-10 DIAGNOSIS — Z4781 Encounter for orthopedic aftercare following surgical amputation: Secondary | ICD-10-CM | POA: Diagnosis not present

## 2023-09-14 DIAGNOSIS — I130 Hypertensive heart and chronic kidney disease with heart failure and stage 1 through stage 4 chronic kidney disease, or unspecified chronic kidney disease: Secondary | ICD-10-CM | POA: Diagnosis not present

## 2023-09-14 DIAGNOSIS — M5489 Other dorsalgia: Secondary | ICD-10-CM | POA: Diagnosis not present

## 2023-09-14 DIAGNOSIS — E1140 Type 2 diabetes mellitus with diabetic neuropathy, unspecified: Secondary | ICD-10-CM | POA: Diagnosis not present

## 2023-09-14 DIAGNOSIS — I10 Essential (primary) hypertension: Secondary | ICD-10-CM | POA: Diagnosis not present

## 2023-09-14 DIAGNOSIS — Z89422 Acquired absence of other left toe(s): Secondary | ICD-10-CM | POA: Diagnosis not present

## 2023-09-14 DIAGNOSIS — Z6836 Body mass index (BMI) 36.0-36.9, adult: Secondary | ICD-10-CM | POA: Diagnosis not present

## 2023-09-14 DIAGNOSIS — E039 Hypothyroidism, unspecified: Secondary | ICD-10-CM | POA: Diagnosis not present

## 2023-09-14 DIAGNOSIS — E113493 Type 2 diabetes mellitus with severe nonproliferative diabetic retinopathy without macular edema, bilateral: Secondary | ICD-10-CM | POA: Diagnosis not present

## 2023-09-14 DIAGNOSIS — Z8631 Personal history of diabetic foot ulcer: Secondary | ICD-10-CM | POA: Diagnosis not present

## 2023-09-14 DIAGNOSIS — R296 Repeated falls: Secondary | ICD-10-CM | POA: Diagnosis not present

## 2023-09-14 DIAGNOSIS — N1832 Chronic kidney disease, stage 3b: Secondary | ICD-10-CM | POA: Diagnosis not present

## 2023-09-14 DIAGNOSIS — I5022 Chronic systolic (congestive) heart failure: Secondary | ICD-10-CM | POA: Diagnosis not present

## 2023-09-14 DIAGNOSIS — E1129 Type 2 diabetes mellitus with other diabetic kidney complication: Secondary | ICD-10-CM | POA: Diagnosis not present

## 2023-09-14 DIAGNOSIS — N2581 Secondary hyperparathyroidism of renal origin: Secondary | ICD-10-CM | POA: Diagnosis not present

## 2023-09-14 DIAGNOSIS — E669 Obesity, unspecified: Secondary | ICD-10-CM | POA: Diagnosis not present

## 2023-09-14 DIAGNOSIS — Z89421 Acquired absence of other right toe(s): Secondary | ICD-10-CM | POA: Diagnosis not present

## 2023-09-14 DIAGNOSIS — Z4781 Encounter for orthopedic aftercare following surgical amputation: Secondary | ICD-10-CM | POA: Diagnosis not present

## 2023-09-17 DIAGNOSIS — Z8631 Personal history of diabetic foot ulcer: Secondary | ICD-10-CM | POA: Diagnosis not present

## 2023-09-17 DIAGNOSIS — M5489 Other dorsalgia: Secondary | ICD-10-CM | POA: Diagnosis not present

## 2023-09-17 DIAGNOSIS — I130 Hypertensive heart and chronic kidney disease with heart failure and stage 1 through stage 4 chronic kidney disease, or unspecified chronic kidney disease: Secondary | ICD-10-CM | POA: Diagnosis not present

## 2023-09-17 DIAGNOSIS — Z4781 Encounter for orthopedic aftercare following surgical amputation: Secondary | ICD-10-CM | POA: Diagnosis not present

## 2023-09-17 DIAGNOSIS — R296 Repeated falls: Secondary | ICD-10-CM | POA: Diagnosis not present

## 2023-09-17 DIAGNOSIS — Z89422 Acquired absence of other left toe(s): Secondary | ICD-10-CM | POA: Diagnosis not present

## 2023-09-22 DIAGNOSIS — Z4781 Encounter for orthopedic aftercare following surgical amputation: Secondary | ICD-10-CM | POA: Diagnosis not present

## 2023-09-22 DIAGNOSIS — Z8631 Personal history of diabetic foot ulcer: Secondary | ICD-10-CM | POA: Diagnosis not present

## 2023-09-22 DIAGNOSIS — I130 Hypertensive heart and chronic kidney disease with heart failure and stage 1 through stage 4 chronic kidney disease, or unspecified chronic kidney disease: Secondary | ICD-10-CM | POA: Diagnosis not present

## 2023-09-22 DIAGNOSIS — R296 Repeated falls: Secondary | ICD-10-CM | POA: Diagnosis not present

## 2023-09-22 DIAGNOSIS — M5489 Other dorsalgia: Secondary | ICD-10-CM | POA: Diagnosis not present

## 2023-09-22 DIAGNOSIS — Z89422 Acquired absence of other left toe(s): Secondary | ICD-10-CM | POA: Diagnosis not present

## 2023-09-24 DIAGNOSIS — E113493 Type 2 diabetes mellitus with severe nonproliferative diabetic retinopathy without macular edema, bilateral: Secondary | ICD-10-CM | POA: Diagnosis not present

## 2023-09-24 DIAGNOSIS — Z4781 Encounter for orthopedic aftercare following surgical amputation: Secondary | ICD-10-CM | POA: Diagnosis not present

## 2023-09-24 DIAGNOSIS — H01009 Unspecified blepharitis unspecified eye, unspecified eyelid: Secondary | ICD-10-CM | POA: Diagnosis not present

## 2023-09-24 DIAGNOSIS — R296 Repeated falls: Secondary | ICD-10-CM | POA: Diagnosis not present

## 2023-09-24 DIAGNOSIS — Z7984 Long term (current) use of oral hypoglycemic drugs: Secondary | ICD-10-CM | POA: Diagnosis not present

## 2023-09-24 DIAGNOSIS — H47213 Primary optic atrophy, bilateral: Secondary | ICD-10-CM | POA: Diagnosis not present

## 2023-09-24 DIAGNOSIS — Z89422 Acquired absence of other left toe(s): Secondary | ICD-10-CM | POA: Diagnosis not present

## 2023-09-24 DIAGNOSIS — H524 Presbyopia: Secondary | ICD-10-CM | POA: Diagnosis not present

## 2023-09-24 DIAGNOSIS — Z961 Presence of intraocular lens: Secondary | ICD-10-CM | POA: Diagnosis not present

## 2023-09-24 DIAGNOSIS — I130 Hypertensive heart and chronic kidney disease with heart failure and stage 1 through stage 4 chronic kidney disease, or unspecified chronic kidney disease: Secondary | ICD-10-CM | POA: Diagnosis not present

## 2023-09-24 DIAGNOSIS — M5489 Other dorsalgia: Secondary | ICD-10-CM | POA: Diagnosis not present

## 2023-09-24 DIAGNOSIS — Z8631 Personal history of diabetic foot ulcer: Secondary | ICD-10-CM | POA: Diagnosis not present

## 2023-09-25 ENCOUNTER — Telehealth: Payer: Self-pay | Admitting: Emergency Medicine

## 2023-09-25 ENCOUNTER — Ambulatory Visit: Payer: Medicare Other | Attending: Cardiology

## 2023-09-25 DIAGNOSIS — G4733 Obstructive sleep apnea (adult) (pediatric): Secondary | ICD-10-CM | POA: Diagnosis not present

## 2023-09-25 DIAGNOSIS — I5020 Unspecified systolic (congestive) heart failure: Secondary | ICD-10-CM | POA: Diagnosis not present

## 2023-09-25 DIAGNOSIS — I639 Cerebral infarction, unspecified: Secondary | ICD-10-CM | POA: Diagnosis not present

## 2023-09-25 DIAGNOSIS — I10 Essential (primary) hypertension: Secondary | ICD-10-CM | POA: Diagnosis not present

## 2023-09-25 DIAGNOSIS — I4892 Unspecified atrial flutter: Secondary | ICD-10-CM | POA: Insufficient documentation

## 2023-09-25 LAB — ECHOCARDIOGRAM COMPLETE
Area-P 1/2: 4.26 cm2
MV M vel: 4.86 m/s
MV Peak grad: 94.5 mmHg
Radius: 0.6 cm
S' Lateral: 4 cm

## 2023-09-25 NOTE — Telephone Encounter (Signed)
-----   Message from Flossie Dibble sent at 09/25/2023  1:30 PM EST ----- Echo shows that your heart is not squeezing as well as it previously was per your last echo. Continue current meds.   For the murmur, you do have moderate leaking around your mitral valve, nothing to do about this at this time. This could cause some mild shortness of breath.   Lets move her follow up appt with Dr. Dulce Sellar, see if he can see her in about 6 weeks. Please ask how she is feeling... SOB?Edema?

## 2023-09-25 NOTE — Telephone Encounter (Signed)
Lvm 1/17

## 2023-09-28 DIAGNOSIS — M810 Age-related osteoporosis without current pathological fracture: Secondary | ICD-10-CM | POA: Diagnosis not present

## 2023-09-28 DIAGNOSIS — Z4781 Encounter for orthopedic aftercare following surgical amputation: Secondary | ICD-10-CM | POA: Diagnosis not present

## 2023-09-28 DIAGNOSIS — Z89422 Acquired absence of other left toe(s): Secondary | ICD-10-CM | POA: Diagnosis not present

## 2023-09-28 DIAGNOSIS — Z8631 Personal history of diabetic foot ulcer: Secondary | ICD-10-CM | POA: Diagnosis not present

## 2023-09-28 DIAGNOSIS — D519 Vitamin B12 deficiency anemia, unspecified: Secondary | ICD-10-CM | POA: Diagnosis not present

## 2023-09-28 DIAGNOSIS — E039 Hypothyroidism, unspecified: Secondary | ICD-10-CM | POA: Diagnosis not present

## 2023-09-28 DIAGNOSIS — I130 Hypertensive heart and chronic kidney disease with heart failure and stage 1 through stage 4 chronic kidney disease, or unspecified chronic kidney disease: Secondary | ICD-10-CM | POA: Diagnosis not present

## 2023-09-28 DIAGNOSIS — Z7901 Long term (current) use of anticoagulants: Secondary | ICD-10-CM | POA: Diagnosis not present

## 2023-09-28 DIAGNOSIS — E113593 Type 2 diabetes mellitus with proliferative diabetic retinopathy without macular edema, bilateral: Secondary | ICD-10-CM | POA: Diagnosis not present

## 2023-09-28 DIAGNOSIS — I5030 Unspecified diastolic (congestive) heart failure: Secondary | ICD-10-CM | POA: Diagnosis not present

## 2023-09-28 DIAGNOSIS — Z9884 Bariatric surgery status: Secondary | ICD-10-CM | POA: Diagnosis not present

## 2023-09-28 DIAGNOSIS — G4730 Sleep apnea, unspecified: Secondary | ICD-10-CM | POA: Diagnosis not present

## 2023-09-28 DIAGNOSIS — R296 Repeated falls: Secondary | ICD-10-CM | POA: Diagnosis not present

## 2023-09-28 DIAGNOSIS — I69390 Apraxia following cerebral infarction: Secondary | ICD-10-CM | POA: Diagnosis not present

## 2023-09-28 DIAGNOSIS — E1122 Type 2 diabetes mellitus with diabetic chronic kidney disease: Secondary | ICD-10-CM | POA: Diagnosis not present

## 2023-09-28 DIAGNOSIS — N189 Chronic kidney disease, unspecified: Secondary | ICD-10-CM | POA: Diagnosis not present

## 2023-09-28 DIAGNOSIS — M5489 Other dorsalgia: Secondary | ICD-10-CM | POA: Diagnosis not present

## 2023-09-28 DIAGNOSIS — E1140 Type 2 diabetes mellitus with diabetic neuropathy, unspecified: Secondary | ICD-10-CM | POA: Diagnosis not present

## 2023-09-28 NOTE — Telephone Encounter (Signed)
Spoke to [employer] per DPR. Reviewed results per Wallis Bamberg, NP note. [name] states that she helps care for the patient. She denies the patient having any shortness of breath. [name] also states that the patient has always had edema on her lower extremity, and takes Lasix for this. Follow up appointment was made. [name] verbalized understanding and had no additional questions.

## 2023-10-01 DIAGNOSIS — M5489 Other dorsalgia: Secondary | ICD-10-CM | POA: Diagnosis not present

## 2023-10-01 DIAGNOSIS — R296 Repeated falls: Secondary | ICD-10-CM | POA: Diagnosis not present

## 2023-10-01 DIAGNOSIS — Z8631 Personal history of diabetic foot ulcer: Secondary | ICD-10-CM | POA: Diagnosis not present

## 2023-10-01 DIAGNOSIS — Z89422 Acquired absence of other left toe(s): Secondary | ICD-10-CM | POA: Diagnosis not present

## 2023-10-01 DIAGNOSIS — I130 Hypertensive heart and chronic kidney disease with heart failure and stage 1 through stage 4 chronic kidney disease, or unspecified chronic kidney disease: Secondary | ICD-10-CM | POA: Diagnosis not present

## 2023-10-01 DIAGNOSIS — Z4781 Encounter for orthopedic aftercare following surgical amputation: Secondary | ICD-10-CM | POA: Diagnosis not present

## 2023-10-08 DIAGNOSIS — Z4781 Encounter for orthopedic aftercare following surgical amputation: Secondary | ICD-10-CM | POA: Diagnosis not present

## 2023-10-08 DIAGNOSIS — Z8631 Personal history of diabetic foot ulcer: Secondary | ICD-10-CM | POA: Diagnosis not present

## 2023-10-08 DIAGNOSIS — Z89422 Acquired absence of other left toe(s): Secondary | ICD-10-CM | POA: Diagnosis not present

## 2023-10-08 DIAGNOSIS — I130 Hypertensive heart and chronic kidney disease with heart failure and stage 1 through stage 4 chronic kidney disease, or unspecified chronic kidney disease: Secondary | ICD-10-CM | POA: Diagnosis not present

## 2023-10-08 DIAGNOSIS — R296 Repeated falls: Secondary | ICD-10-CM | POA: Diagnosis not present

## 2023-10-08 DIAGNOSIS — M5489 Other dorsalgia: Secondary | ICD-10-CM | POA: Diagnosis not present

## 2023-10-13 DIAGNOSIS — Z4781 Encounter for orthopedic aftercare following surgical amputation: Secondary | ICD-10-CM | POA: Diagnosis not present

## 2023-10-13 DIAGNOSIS — Z8631 Personal history of diabetic foot ulcer: Secondary | ICD-10-CM | POA: Diagnosis not present

## 2023-10-13 DIAGNOSIS — Z89422 Acquired absence of other left toe(s): Secondary | ICD-10-CM | POA: Diagnosis not present

## 2023-10-13 DIAGNOSIS — I130 Hypertensive heart and chronic kidney disease with heart failure and stage 1 through stage 4 chronic kidney disease, or unspecified chronic kidney disease: Secondary | ICD-10-CM | POA: Diagnosis not present

## 2023-10-13 DIAGNOSIS — R296 Repeated falls: Secondary | ICD-10-CM | POA: Diagnosis not present

## 2023-10-13 DIAGNOSIS — M5489 Other dorsalgia: Secondary | ICD-10-CM | POA: Diagnosis not present

## 2023-10-15 ENCOUNTER — Ambulatory Visit (INDEPENDENT_AMBULATORY_CARE_PROVIDER_SITE_OTHER): Payer: Medicare Other | Admitting: Podiatry

## 2023-10-15 DIAGNOSIS — L03115 Cellulitis of right lower limb: Secondary | ICD-10-CM

## 2023-10-15 DIAGNOSIS — S90821A Blister (nonthermal), right foot, initial encounter: Secondary | ICD-10-CM | POA: Diagnosis not present

## 2023-10-15 DIAGNOSIS — S9031XA Contusion of right foot, initial encounter: Secondary | ICD-10-CM | POA: Diagnosis not present

## 2023-10-15 DIAGNOSIS — L089 Local infection of the skin and subcutaneous tissue, unspecified: Secondary | ICD-10-CM

## 2023-10-15 MED ORDER — CEPHALEXIN 500 MG PO CAPS: 500.0000 mg | ORAL_CAPSULE | Freq: Three times a day (TID) | ORAL | 0 refills | Status: AC

## 2023-10-15 NOTE — Progress Notes (Signed)
 Chief Complaint  Patient presents with   Diabetic Ulcer    Possible DB Ulcer on the back of the right heel at the bottom of the heel. Possible blister in that area. Last A1c not on file, FBS yesterday was 112. Takes Eliquis , Aid is in the room    HPI: 77 y.o. diabetic female presents today with a blister on posterior/plantar right heel.  A caregiver is with her today.  She notes a lot of pain in the heel.  She thinks it might have rubbed against something or hit against an object of some short.  They are not sure how this began.   Past Medical History:  Diagnosis Date   Acquired hammer toes of both feet 01/22/2017   Arthritis 11/29/2013   Atrial flutter (HCC) 01/24/2014   Overview:  CHADS2 vasc score= 5   B-complex deficiency 11/29/2013   Biventricular failure (HCC)    Cerebral artery occlusion with cerebral infarction (HCC) 12/04/2013   Overview:  STORY: MRI +ve Left frontoparietal infarction (LMCA). +AFib.`E1o3L`IMPRESSION: BP is under controlled. Continue home health. +AFib, con't Eliquis  5mg  bid. Will obtain old record from [facility] for review.   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  2cm superficial blister on posteroplantar right heel with surrounding erythema, calor,  and pain on palpation noted.  Puncture of blister revealed serous drainage only, no purulence.    Assessment/Plan of Care: 1. Blister of right heel with infection, initial encounter   2. Contusion of right foot, initial encounter   3. Cellulitis of right foot      Meds ordered this encounter  Medications   cephALEXin  (KEFLEX ) 500 MG capsule    Sig: Take 1 capsule (500 mg total) by mouth 3 (three) times daily for 7 days.    Dispense:  21 capsule    Refill:  0   Discussed clinical findings with patient today.  With patient's consent, the blister was prepped and punctured with a sterile 18g needle.  Roof was left intact.  Serous drainage expressed.  Abx ointment and Biatain silcone foam bandage applied.  Change daily, using iodine solution and DSD daily.  Epsom salt soaks every day for 15 minutes until f/u in 1-2 weeks.  Rx Keflex  500mg  TID for the localized infection   Josejulian Tarango DSABRA Imperial, DPM, FACFAS [facility]     [address]  [place], [place] [address]                Office [phone]  Fax [phone]

## 2023-10-22 ENCOUNTER — Encounter: Payer: Medicare Other | Admitting: Podiatry

## 2023-10-22 NOTE — Progress Notes (Signed)
Patient did not show for her scheduled appointment this afternoon

## 2023-11-06 ENCOUNTER — Ambulatory Visit (INDEPENDENT_AMBULATORY_CARE_PROVIDER_SITE_OTHER): Payer: Medicare Other | Admitting: Podiatry

## 2023-11-06 DIAGNOSIS — L089 Local infection of the skin and subcutaneous tissue, unspecified: Secondary | ICD-10-CM | POA: Diagnosis not present

## 2023-11-06 DIAGNOSIS — S90425A Blister (nonthermal), left lesser toe(s), initial encounter: Secondary | ICD-10-CM | POA: Diagnosis not present

## 2023-11-06 MED ORDER — DOXYCYCLINE HYCLATE 100 MG PO CAPS: 100.0000 mg | ORAL_CAPSULE | Freq: Two times a day (BID) | ORAL | 0 refills | Status: AC

## 2023-11-06 NOTE — Progress Notes (Signed)
 Chief Complaint  Patient presents with   Wound Check    Left heel looks good from debreidment last visit. (10/15/23) Today she is here for the 5th toe on the right foot. There is a blister that looks like it opened and there was drainage on the sock which her helper said was new since this morning. There is a callous on this toe as well. Last A1c was maybe a month ago at 7.1 takes Eliquis   HPI: 78 y.o. female presents today with a caregiver for follow-up of a right posterior heel blister.  The caregiver noted that this had resolved but she now has a blister on the left fifth toe which is painful for the patient.  She has not changed shoe gear.  Her caregiver notes that she does have a surgical shoe at home.  She is in a closed toe shoe today.  Past Medical History:  Diagnosis Date   Acquired hammer toes of both feet 01/22/2017   Arthritis 11/29/2013   Atrial flutter (HCC) 01/24/2014   Overview:  CHADS2 vasc score= 5   B-complex deficiency 11/29/2013   Biventricular failure (HCC)    Cerebral artery occlusion with cerebral infarction (HCC) 12/04/2013   Overview:  STORY: MRI +ve Left frontoparietal infarction (LMCA). +AFib.`E1o3L`IMPRESSION: BP is under controlled. Continue home health. +AFib, con't Eliquis 5mg  bid. Will obtain old record from [facility] for review.   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  The posterior right heel blister has completely resolved at this time.  There is no residual erythema or edema.  The left fifth toe does have a circumferential blister around the DIPJ area.  There is localized erythema and edema and pain on palpation of the area.  The roof of the blister is still intact with minimal maceration noted.  This is superficial in nature  Assessment/Plan of Care: 1. Infected blister of fifth toe of left foot, initial encounter      Meds ordered this encounter  Medications   doxycycline (VIBRAMYCIN) 100 MG capsule    Sig: Take 1 capsule (100 mg total) by mouth 2 (two) times daily for 7 days.    Dispense:  14 capsule    Refill:  0   Discussed clinical findings with patient today.  Will have the caregiver performed daily applications of iodine solution to the left fifth toe followed by a light  gauze dressing.  She was shown how to do this today while it was performed on her fifth toe.  Discontinue closed toe shoes at this time and begin wearing one of her surgical shoes that she has at back at the facility where she resides.  Sent in doxycycline 100 mg 1 tab p.o. twice daily x 7 days for the cellulitis on the left fifth toe.  Follow-up in 2 weeks   Audra Bellard DBurna Mortimer, DPM, FACFAS [facility]     [address] [place], [place] [address]                Office [phone]  Fax [phone]

## 2023-11-12 ENCOUNTER — Encounter: Payer: Self-pay | Admitting: Cardiology

## 2023-11-13 ENCOUNTER — Ambulatory Visit (INDEPENDENT_AMBULATORY_CARE_PROVIDER_SITE_OTHER): Payer: Medicare Other | Admitting: Podiatry

## 2023-11-13 DIAGNOSIS — L97522 Non-pressure chronic ulcer of other part of left foot with fat layer exposed: Secondary | ICD-10-CM | POA: Diagnosis not present

## 2023-11-13 DIAGNOSIS — E11621 Type 2 diabetes mellitus with foot ulcer: Secondary | ICD-10-CM | POA: Diagnosis not present

## 2023-11-13 NOTE — Progress Notes (Signed)
 Chief Complaint  Patient presents with   Wound Check    Wound check of the left 5th toe. She states it is painful today. Looks like it is heeling. She is wearing a post op shoe. Last A1c 7.1 she thinks. No documentation to see. Takes Elliquis   HPI: 78 y.o. female presenting today for follow-up of infected blister to the left fifth toe.  They have been applying iodine ointment to the area followed by a light gauze dressing.  Patient notes that overall the toe feels better but it still has pain.  Caregivers with the patient today.  She has a surgical shoe today but does not like how this fits on her foot and feels like she is going to fall.  Past Medical History:  Diagnosis Date   Acquired hammer toes of both feet 01/22/2017   Acute respiratory failure with hypoxia (HCC)    Arthritis 11/29/2013   Atrial flutter (HCC) 01/24/2014   Overview:  CHADS2 vasc score= 5   B-complex deficiency 11/29/2013   Bilateral sensorineural hearing loss 05/17/2019   Biventricular failure (HCC)    Cerebral artery occlusion with cerebral infarction (HCC) 12/04/2013   Overview:  STORY: MRI +ve Left frontoparietal infarction (LMCA). +AFib.`E1o3L`IMPRESSION: BP is under controlled. Continue home health. +AFib, con't Eliquis 5mg  bid. Will obtain old record from [facility] for review.   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   Pneumonia due to  COVID-19 virus 10/19/2019   Pre-ulcerative corn or callous 01/22/2017   Pressure injury of skin 10/05/2019   Sleep apnea 11/29/2013   Stroke (HCC) 01/24/2014   has some expressive aphasia   Sudden right hearing loss 05/17/2019   Thyroid disease 11/29/2013   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  Dermatology: The erythema on the left fifth toe has resolved.  The blister has resolved.  There remains a hard hyperkeratotic lesion on the distal lateral aspect of the left fifth toe.    Wound 1:  Location: Distal lateral left fifth toe        Depth: To subcutaneous tissue        Wound Border: Hyperkeratotic        Wound Base: Granular        Drainage: None       Odor?:  None  Surrounding Tissue: No erythema or edema or calor today        Infected?:  No        Necrosis?:  No        Pain?:  Yes        Tunneling: No       Dimensions (cm): 0.2 x 0.3 x 0.2 cm  Vascular: Pedal pulses are palpable  Neurological: Light touch sensation diminished  Musculoskeletal Exam: Adductovarus deformity left fifth toe   ASSESSMENT / PLAN OF CARE: 1. Diabetic ulcer of toe of left foot associated with type 2 diabetes mellitus, with fat layer exposed (HCC)     SURGICAL BOOT  The ulceration was sharply debrided of hyperkeratotic and devitalized soft tissue with sterile #312 blade to the level of subcutaneous tissue.   Hemostasis obtained.  Antibiotic ointment and DSD applied.  Reviewed off-loading with patient.  Surgical shoe dispensed for patient to wear at all times WB.  She noted immediate improvement of comfort with the shoe compared to her old surgical shoe.  Still need to avoid closed toe shoes at this time.  Reviewed daily dressing changes with patient and her caregiver.  They will continue with the iodine solution with gauze dressing daily.  Will reassess next visit.  Discussed risks / concerns regarding ulcer with patient and possible sequelae if left untreated.  Stressed importance of infection prevention at home. Short-term goals are: prevent infection, off-load ulcer, heal ulcer Long-term goals are:  prevent recurrence, prevent amputation.   Return in about 2 weeks (around 11/27/2023) for f/u ulcer.   Clerance Lav, DPM, FACFAS [facility]     [address] [place], [place] [address]                Office [phone]  Fax [phone]

## 2023-11-16 DIAGNOSIS — H903 Sensorineural hearing loss, bilateral: Secondary | ICD-10-CM | POA: Diagnosis not present

## 2023-11-16 DIAGNOSIS — H6122 Impacted cerumen, left ear: Secondary | ICD-10-CM | POA: Diagnosis not present

## 2023-11-16 NOTE — Progress Notes (Unsigned)
 Cardiology Office Note:    Date:  11/17/2023   ID:  Debra Mcconnell, DOB [birth year], MRN [id number]  PCP:  Alinda Deem, MD  Cardiologist:  Norman Herrlich, MD    Referring MD: Alinda Deem, MD    ASSESSMENT:    1. Atrial flutter, unspecified type (HCC)   2. Cerebrovascular accident (CVA), unspecified mechanism (HCC)   3. Chronic anticoagulation   4. Hypertensive heart disease with heart failure (HCC)   5. Stage 3b chronic kidney disease (HCC)   6. LBBB (left bundle branch block)    PLAN:    In order of problems listed above:  Rate is controlled today more characteristic of coarse atrial fibrillation a better chronic rhythm requires no suppressant treatment and continue her current anticoagulant Eliquis 5 mg twice daily with stroke Stable hypertension heart failure is compensated she will continue her current loop diuretic SGLT2 inhibitor and transition from Entresto to valsartan because of financial difficulty Stable CKD and left bundle branch block   Next appointment: 6 months we will recheck an echocardiogram after that visit   Medication Adjustments/Labs and Tests Ordered: Current medicines are reviewed at length with the patient today.  Concerns regarding medicines are outlined above.  Orders Placed This Encounter  Procedures   EKG 12-Lead   Meds ordered this encounter  Medications   valsartan (DIOVAN) 80 MG tablet    Sig: Take 1 tablet (80 mg total) by mouth 2 (two) times daily.    Dispense:  180 tablet    Refill:  3     History of Present Illness:    Debra Mcconnell is a 78 y.o. female with a hx of atrial arrhythmia and chronic atrial flutter previous stroke with long-term anticoagulation left bundle branch block cardiomyopathy with stage III CKD and hypertensive heart disease last seen 02/12/2023.  Her most recent echocardiogram showed normalization of ejection fraction 35 to 40%   Compliance with diet, lifestyle and medications: Yes  She is able to  healthcare providers today She has no cardiovascular complaints of edema shortness of breath chest pain palpitation or syncope She complains bitterly of the cost of medications I think she has been put on [place] for renal protection and requested transition to valsartan to reduce out-of-pocket expenses She has had no bleeding with her anticoagulant EKG is now shows coarse atrial fibrillation rather than flutter with a controlled rate  Past Medical History:  Diagnosis Date   Acquired hammer toes of both feet 01/22/2017   Acute respiratory failure with hypoxia (HCC)    Arthritis 11/29/2013   Atrial flutter (HCC) 01/24/2014   Overview:  CHADS2 vasc score= 5   B-complex deficiency 11/29/2013   Bilateral sensorineural hearing loss 05/17/2019   Biventricular failure (HCC)    Cerebral artery occlusion with cerebral infarction (HCC) 12/04/2013   Overview:  STORY: MRI +ve Left frontoparietal infarction (LMCA). +AFib.`E1o3L`IMPRESSION: BP is under controlled. Continue home health. +AFib, con't Eliquis 5mg  bid. Will obtain old record from [facility] for review.   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   atorvastatin (LIPITOR) 40 MG tablet Take 40 mg by mouth at bedtime.   BD PEN NEEDLE NANO 2ND GEN 32G X 4 MM MISC USE WITH PEN AS DIRECTED.   Blood Glucose Monitoring Suppl (ACCU-CHEK GUIDE) w/Device KIT USE TO CHECK BLOOD SUGAR DAILY   buPROPion (WELLBUTRIN XL) 300 MG 24 hr tablet Take 300 mg by mouth daily.   carvedilol (COREG) 25 MG tablet Take 25 mg by mouth 2 (two) times daily with a meal.   Cholecalciferol (VITAMIN D) 2000 units CAPS Take 4,000 Units by mouth daily.   dapagliflozin propanediol (FARXIGA) 10 MG TABS tablet Take 1 tablet (10 mg total) by mouth daily.   ELIQUIS 5 MG TABS tablet TAKE 1 TABLET TWICE A DAY   Exenatide ER (BYDUREON BCISE) 2 MG/0.85ML AUIJ Inject 2 mg into the skin once a week.   ferrous sulfate 325 (65 FE) MG EC tablet Take 325 mg by mouth 2 (two) times daily with a meal.   Fluticasone Propionate (ALLERGY RELIEF NA) Place 1 spray into the nose daily as needed (allergies).   furosemide (LASIX) 20 MG tablet Take 20-40 mg by mouth See admin instructions. Take 2 tablets (40 mg) in am and 1 tablet (20 mg) at noon   gabapentin (NEURONTIN) 300 MG capsule Take 300-1,200 mg by mouth See admin instructions. Take 300 mg in the morning and 300 mg at noon, and 4 capsule (1200mg) at bedtime   hydrALAZINE (APRESOLINE) 25 MG tablet Take 25 mg by mouth 3 (three) times daily.   KERENDIA 10 MG TABS Take 1 tablet by mouth daily.   ketoconazole (NIZORAL) 2 % cream Apply 1 application. topically daily as needed for irritation.   Lancets (ONETOUCH DELICA PLUS LANCET33G) MISC Apply 1 each topically daily.   levothyroxine (SYNTHROID, LEVOTHROID) 175 MCG tablet Take 175 mcg by mouth daily.    potassium chloride (KLOR-CON) 10 MEQ tablet Take 15 mEq by mouth daily at 12 noon.   valsartan (DIOVAN) 80 MG tablet Take 1 tablet (80 mg total) by mouth 2 (two) times daily.   vitamin B-12 (CYANOCOBALAMIN) 1000 MCG tablet Take 1,000 mcg by mouth daily.   zolpidem (AMBIEN) 5 MG tablet Take 5 mg by mouth at bedtime as needed for sleep.   [DISCONTINUED] sacubitril-valsartan (ENTRESTO) 49-51 MG Take 1 tablet by mouth 2 (two) times daily.      EKGs/Labs/Other Studies Reviewed:    The following studies were reviewed today:  Cardiac Studies & Procedures   ______________________________________________________________________________________________     ECHOCARDIOGRAM  ECHOCARDIOGRAM COMPLETE 09/25/2023  Narrative ECHOCARDIOGRAM REPORT    Patient Name:   Eviana L Jasperson Date of Exam: 09/25/2023 Medical Rec #:  [id number]     Height:       65.0 in Accession #:    [id number]    Weight:       21 7.6 lb Date of Birth:  1946-03-01  BSA:          2.050 m Patient Age:    77 years      BP:           116/58 mmHg Patient Gender: F             HR:           61 bpm. Exam Location:  [facility]  Procedure: 2D Echo, Cardiac Doppler, Color Doppler and Strain Analysis  Indications:    Atrial flutter, unspecified type (HCC) [V78.46 (ICD-10-CM)]; Cerebrovascular accident (CVA), unspecified mechanism (HCC) [I63.9 (ICD-10-CM)]; HFrEF (heart failure with reduced ejection fraction) (HCC) [I50.20 (ICD-10-CM)]; OSA (obstructive sleep apnea) [G47.33 (ICD-10-CM)]; Essential hypertension [I10 (ICD-10-CM)]  History:        Patient has prior history of Echocardiogram examinations, most recent 02/19/2021. CHF, CVA, Mitral Valve Disease, Arrythmias:Atrial Flutter; Risk Factors:Hypertension.  Sonographer:    Margreta Journey RDCS Referring Phys: [phone] Flossie Dibble   Sonographer Comments: Restricted mobility. IMPRESSIONS   1. Atrial flutter . Left ventricular ejection fraction, by estimation, is 35 to  40%. The left ventricle has moderately decreased function. The left ventricle demonstrates global hypokinesis. The left ventricular internal cavity size was mildly dilated. There is mild left ventricular hypertrophy. Left ventricular diastolic parameters are indeterminate. Elevated left ventricular end-diastolic pressure. The average left ventricular global longitudinal strain is 11.3 %. The global longitudinal strain is abnormal. 2. Right ventricular systolic function is normal. The right ventricular size is normal. There is moderately elevated pulmonary artery systolic pressure. 3. Left atrial size was severely dilated. 4. Right atrial size was moderately dilated. 5. The mitral valve is degenerative. Moderate mitral valve regurgitation. No evidence of mitral stenosis. 6. The aortic valve is normal in structure. Aortic valve regurgitation is not visualized. Aortic valve sclerosis/calcification is present, without any evidence of aortic stenosis. 7. The inferior vena cava is dilated in size with >50% respiratory variability, suggesting right atrial pressure of 8 mmHg.  FINDINGS Left Ventricle: Atrial flutter. Left ventricular ejection fraction, by estimation, is 35 to 40%. The left ventricle has moderately decreased function. The left ventricle demonstrates global hypokinesis. The average left ventricular global longitudinal strain is 11.3 %. The global longitudinal strain is abnormal. The left ventricular internal cavity size was mildly dilated. There is mild left ventricular hypertrophy. Left ventricular diastolic parameters are indeterminate. Elevated left ventricular end-diastolic pressure.  Right Ventricle: The right ventricular size is normal. No increase in right ventricular wall thickness. Right ventricular systolic function is normal. There is moderately elevated pulmonary artery systolic pressure. The tricuspid regurgitant velocity is 3.50 m/s, and with an assumed right atrial pressure of 8  mmHg, the estimated right ventricular systolic pressure is 57.0 mmHg.  Left Atrium: Left atrial size was severely dilated.  Right Atrium: Right atrial size was moderately dilated.  Pericardium: There is no evidence of pericardial effusion.  Mitral Valve: The mitral valve is degenerative in appearance. There is mild thickening of the anterior and posterior mitral valve leaflet(s). Mild mitral annular calcification. Moderate mitral valve regurgitation. No evidence of mitral valve stenosis.  Tricuspid Valve: The tricuspid valve is normal in structure. Tricuspid valve regurgitation is mild . No evidence of tricuspid stenosis.  Aortic Valve: The aortic valve is normal in structure. Aortic valve regurgitation is not visualized. Aortic valve sclerosis/calcification is present, without any evidence of aortic stenosis.  Pulmonic Valve: The pulmonic valve was normal in structure. Pulmonic valve regurgitation is not visualized. No evidence of pulmonic stenosis.  Aorta: The aortic  arch was not well visualized.  Venous: The pulmonary veins were not well visualized. The inferior vena cava is dilated in size with greater than 50% respiratory variability, suggesting right atrial pressure of 8 mmHg.  IAS/Shunts: No atrial level shunt detected by color flow Doppler.   LEFT VENTRICLE PLAX 2D LVIDd:         5.50 cm   Diastology LVIDs:         4.00 cm   LV e' medial:    6.09 cm/s LV PW:         1.20 cm   LV E/e' medial:  21.0 LV IVS:        1.20 cm   LV e' lateral:   13.90 cm/s LVOT diam:     2.10 cm   LV E/e' lateral: 9.2 LV SV:         74 LV SV Index:   36        2D Longitudinal Strain LVOT Area:     3.46 cm  2D Strain GLS Avg:     11.3 %   RIGHT VENTRICLE            IVC RV Basal diam:  4.90 cm    IVC diam: 3.00 cm RV Mid diam:    3.80 cm RV S prime:     8.92 cm/s TAPSE (M-mode): 2.6 cm  LEFT ATRIUM              Index        RIGHT ATRIUM           Index LA diam:        4.80 cm  2.34 cm/m    RA Area:     25.60 cm LA Vol (A2C):   151.0 ml 73.65 ml/m  RA Volume:   81.60 ml  39.80 ml/m LA Vol (A4C):   165.0 ml 80.47 ml/m LA Biplane Vol: 157.0 ml 76.57 ml/m AORTIC VALVE LVOT Vmax:   95.20 cm/s LVOT Vmean:  63.900 cm/s LVOT VTI:    0.215 m  AORTA Ao Root diam: 3.10 cm Ao Asc diam:  2.80 cm Ao Desc diam: 2.00 cm  MITRAL VALVE                  TRICUSPID VALVE MV Area (PHT): 4.26 cm       TR Peak grad:   49.0 mmHg MV Decel Time: 178 msec       TR Vmax:        350.00 cm/s MR Peak grad:    94.5 mmHg MR Mean grad:    63.0 mmHg    SHUNTS MR Vmax:         486.00 cm/s  Systemic VTI:  0.22 m MR Vmean:        375.0 cm/s   Systemic Diam: 2.10 cm MR PISA:         2.26 cm MR PISA Eff ROA: 18 mm MR PISA Radius:  0.60 cm MV E velocity: 128.00 cm/s MV A velocity: 51.75 cm/s MV E/A ratio:  2.47  Norman Herrlich MD Electronically signed by Norman Herrlich MD Signature Date/Time: 09/25/2023/12:22:17 PM    Final    MONITORS  LONG TERM MONITOR (3-14 DAYS) 11/18/2021  Narrative Patch Wear Time:  3 days and 0 hours (2023-03-01T12:05:37-0500 to 2023-03-04T12:42:03-499)  Atrial Flutter occurred continuously (100% burden), ranging from 38-93 bpm (avg of 64 bpm). Bundle Branch Block/IVCD was present.  Good heart rate control heart rate in the range  99% of the time daytime 97% time nighttime 50 to 110 bpm and no pauses of greater than 3 seconds or sustained bradycardia.  Isolated VEs were occasional (3.2%, 7925), VE  Couplets were rare (<1.0%, 64), and no VE Triplets were present. Ventricular Bigeminy and Trigeminy were present.  Conclusion atrial fibrillation/flutter with good heart rate control: Occasional PVCs.  There were no symptomatic events.       ______________________________________________________________________________________________      EKG Interpretation Date/Time:  Tuesday November 17 2023 14:33:56 EDT Ventricular Rate:  57 PR Interval:    QRS Duration:  162 QT  Interval:  520 QTC Calculation: 506 R Axis:   257  Text Interpretation: Coarse atrial fibrillation controlled rate Non-specific intra-ventricular conduction block When compared with ECG of 04-Oct-2019 21:39, atrial fibrillation has replaced atrial flutter Borderline criteria for Lateral infarct are now Present Confirmed by Norman Herrlich ([id number]) on 11/17/2023 2:41:05 PM   Recent Labs: 08/31/2023: BUN 66; Creatinine, Ser 1.92; Hemoglobin 11.1; Platelets 143; Potassium 4.9; Sodium 139  Recent Lipid Panel    Component Value Date/Time   CHOL 108 01/25/2021 1628   TRIG 43 01/25/2021 1628   HDL 50 01/25/2021 1628   CHOLHDL 2.2 01/25/2021 1628   LDLCALC 47 01/25/2021 1628    Physical Exam:    VS:  BP 118/82   Pulse (!) 57   Ht 5\' 6"  (1.676 m)   Wt 224 lb 9.6 oz (101.9 kg)   SpO2 96%   BMI 36.25 kg/m     Wt Readings from Last 3 Encounters:  11/17/23 224 lb 9.6 oz (101.9 kg)  08/31/23 217 lb 9.6 oz (98.7 kg)  02/12/23 222 lb 3.2 oz (100.8 kg)     GEN:  Well nourished, well developed in no acute distress HEENT: Normal NECK: No JVD; No carotid bruits LYMPHATICS: No lymphadenopathy CARDIAC: RRR, no murmurs, rubs, gallops RESPIRATORY:  Clear to auscultation without rales, wheezing or rhonchi  ABDOMEN: Soft, non-tender, non-distended MUSCULOSKELETAL:  No edema; No deformity  SKIN: Warm and dry NEUROLOGIC:  Alert and oriented x 3 PSYCHIATRIC:  Normal affect    Signed, Norman Herrlich, MD  11/17/2023 3:34 PM    [facility]

## 2023-11-17 ENCOUNTER — Encounter: Payer: Self-pay | Admitting: Cardiology

## 2023-11-17 ENCOUNTER — Ambulatory Visit: Payer: Medicare Other | Attending: Cardiology | Admitting: Cardiology

## 2023-11-17 VITALS — BP 118/82 | HR 57 | Ht 66.0 in | Wt 224.6 lb

## 2023-11-17 DIAGNOSIS — I4892 Unspecified atrial flutter: Secondary | ICD-10-CM | POA: Diagnosis present

## 2023-11-17 DIAGNOSIS — Z7901 Long term (current) use of anticoagulants: Secondary | ICD-10-CM

## 2023-11-17 DIAGNOSIS — N1832 Chronic kidney disease, stage 3b: Secondary | ICD-10-CM

## 2023-11-17 DIAGNOSIS — I110 Hypertensive heart disease with heart failure: Secondary | ICD-10-CM | POA: Diagnosis not present

## 2023-11-17 DIAGNOSIS — I639 Cerebral infarction, unspecified: Secondary | ICD-10-CM

## 2023-11-17 DIAGNOSIS — I447 Left bundle-branch block, unspecified: Secondary | ICD-10-CM | POA: Diagnosis not present

## 2023-11-17 MED ORDER — VALSARTAN 80 MG PO TABS: 80.0000 mg | ORAL_TABLET | Freq: Two times a day (BID) | ORAL | 3 refills | Status: AC

## 2023-11-17 NOTE — Patient Instructions (Signed)
 Medication Instructions:  Your physician has recommended you make the following change in your medication:   STOP: Entresto START: Valsartan 80 mg two times daily  *If you need a refill on your cardiac medications before your next appointment, please call your pharmacy*   Lab Work: None If you have labs (blood work) drawn today and your tests are completely normal, you will receive your results only by: MyChart Message (if you have MyChart) OR A paper copy in the mail If you have any lab test that is abnormal or we need to change your treatment, we will call you to review the results.   Testing/Procedures: None   Follow-Up: At [facility], you and your health needs are our priority.  As part of our continuing mission to provide you with exceptional heart care, we have created designated Provider Care Teams.  These Care Teams include your primary Cardiologist (physician) and Advanced Practice Providers (APPs -  Physician Assistants and Nurse Practitioners) who all work together to provide you with the care you need, when you need it.  We recommend signing up for the patient portal called "MyChart".  Sign up information is provided on this After Visit Summary.  MyChart is used to connect with patients for Virtual Visits (Telemedicine).  Patients are able to view lab/test results, encounter notes, upcoming appointments, etc.  Non-urgent messages can be sent to your provider as well.   To learn more about what you can do with MyChart, go to [email or web address].    Your next appointment:   6 month(s)  Provider:   Wallis Bamberg, NP  Other Instructions None

## 2023-11-26 ENCOUNTER — Ambulatory Visit: Payer: Medicare Other | Admitting: Podiatry

## 2023-12-04 ENCOUNTER — Ambulatory Visit (INDEPENDENT_AMBULATORY_CARE_PROVIDER_SITE_OTHER): Admitting: Podiatry

## 2023-12-04 DIAGNOSIS — M79675 Pain in left toe(s): Secondary | ICD-10-CM

## 2023-12-04 DIAGNOSIS — B351 Tinea unguium: Secondary | ICD-10-CM

## 2023-12-04 DIAGNOSIS — M79674 Pain in right toe(s): Secondary | ICD-10-CM

## 2023-12-04 NOTE — Progress Notes (Signed)
 Subjective:  Patient ID: Debra Mcconnell, female    DOB: 1946/08/22,  MRN: [id number]   Debra Mcconnell presents to clinic today for:  Chief Complaint  Patient presents with   [facility]    [facility]  along with ulcer check. Last A1c was a little high but we do not have the number, FBS this morning was 160, she didn't not sleep well and had coffee and a muffin early in the am. She takes elliquis.    Patient notes nails are thick, discolored, elongated and painful in shoegear when trying to ambulate.  She is also here for follow-up of the left fifth toe ulceration.  She denies pain at this time.  Her caregiver notes that there has not been any recent drainage so they have not been changing any dressing to the toe and it has been left uncovered.  PCP is Alinda Deem, MD.  Past Medical History:  Diagnosis Date   Acquired hammer toes of both feet 01/22/2017   Acute respiratory failure with hypoxia (HCC)    Arthritis 11/29/2013   Atrial flutter (HCC) 01/24/2014   Overview:  CHADS2 vasc score= 5   B-complex deficiency 11/29/2013   Bilateral sensorineural hearing loss 05/17/2019   Biventricular failure (HCC)    Cerebral artery occlusion with cerebral infarction (HCC) 12/04/2013   Overview:  STORY: MRI +ve Left frontoparietal infarction (LMCA). +AFib.`E1o3L`IMPRESSION: BP is under controlled. Continue home health. +AFib, con't Eliquis 5mg  bid. Will obtain old record from [facility] for review.   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   OSA  (obstructive sleep apnea) 01/24/2014   CPAP   Osteoarthritis of left glenohumeral joint 11/21/2021   Pneumonia due to COVID-19 virus 10/19/2019   Pre-ulcerative corn or callous 01/22/2017   Pressure injury of skin 10/05/2019   Sleep apnea 11/29/2013   Stroke (HCC) 01/24/2014   has some expressive aphasia   Sudden right hearing loss 05/17/2019   Thyroid disease 11/29/2013   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  Objective:  Debra Mcconnell is a pleasant 78 y.o. female in NAD. AAO x 3.  Vascular Examination: Capillary refill time is 3-5 seconds to toes bilateral. Palpable pedal pulses b/l LE. Digital hair present b/l.  Skin temperature gradient WNL b/l. No varicosities b/l. No cyanosis noted b/l.   Dermatological Examination: Pedal skin with normal turgor, texture and tone b/l. No open wounds. No interdigital macerations b/l. Toenails x 9 are 3mm  thick, discolored, dystrophic with subungual debris. There is pain with compression of the nail plates.  They are elongated x 9.  The ulcerated corn on the left fifth toe has completely resolved at this time.  There is no pain to the area.  There is just some dry peeling skin  Assessment/Plan: 1. Pain due to onychomycosis of toenails of both feet    The mycotic toenails were sharply debrided x 9 with sterile nail nippers and a power debriding burr to decrease bulk/thickness and length.    Return  in about 3 months (around 03/05/2024) for [facility]   Clerance Lav, DPM, FACFAS [facility]     [address] [place], [place] [address]                Office [phone]  Fax [phone]

## 2023-12-10 DIAGNOSIS — E7800 Pure hypercholesterolemia, unspecified: Secondary | ICD-10-CM | POA: Diagnosis not present

## 2023-12-10 DIAGNOSIS — E039 Hypothyroidism, unspecified: Secondary | ICD-10-CM | POA: Diagnosis not present

## 2023-12-10 DIAGNOSIS — L308 Other specified dermatitis: Secondary | ICD-10-CM | POA: Diagnosis not present

## 2023-12-10 DIAGNOSIS — N184 Chronic kidney disease, stage 4 (severe): Secondary | ICD-10-CM | POA: Diagnosis not present

## 2023-12-10 DIAGNOSIS — E113493 Type 2 diabetes mellitus with severe nonproliferative diabetic retinopathy without macular edema, bilateral: Secondary | ICD-10-CM | POA: Diagnosis not present

## 2023-12-10 DIAGNOSIS — I10 Essential (primary) hypertension: Secondary | ICD-10-CM | POA: Diagnosis not present

## 2024-01-04 DIAGNOSIS — L299 Pruritus, unspecified: Secondary | ICD-10-CM | POA: Diagnosis not present

## 2024-01-04 DIAGNOSIS — L245 Irritant contact dermatitis due to other chemical products: Secondary | ICD-10-CM | POA: Diagnosis not present

## 2024-02-08 DIAGNOSIS — L89612 Pressure ulcer of right heel, stage 2: Secondary | ICD-10-CM | POA: Diagnosis not present

## 2024-02-08 DIAGNOSIS — G479 Sleep disorder, unspecified: Secondary | ICD-10-CM | POA: Diagnosis not present

## 2024-02-08 DIAGNOSIS — E1140 Type 2 diabetes mellitus with diabetic neuropathy, unspecified: Secondary | ICD-10-CM | POA: Diagnosis not present

## 2024-02-08 DIAGNOSIS — I10 Essential (primary) hypertension: Secondary | ICD-10-CM | POA: Diagnosis not present

## 2024-02-08 DIAGNOSIS — E1129 Type 2 diabetes mellitus with other diabetic kidney complication: Secondary | ICD-10-CM | POA: Diagnosis not present

## 2024-02-08 DIAGNOSIS — Z6837 Body mass index (BMI) 37.0-37.9, adult: Secondary | ICD-10-CM | POA: Diagnosis not present

## 2024-02-08 DIAGNOSIS — I5022 Chronic systolic (congestive) heart failure: Secondary | ICD-10-CM | POA: Diagnosis not present

## 2024-02-08 DIAGNOSIS — E669 Obesity, unspecified: Secondary | ICD-10-CM | POA: Diagnosis not present

## 2024-02-08 DIAGNOSIS — N1832 Chronic kidney disease, stage 3b: Secondary | ICD-10-CM | POA: Diagnosis not present

## 2024-02-08 DIAGNOSIS — E039 Hypothyroidism, unspecified: Secondary | ICD-10-CM | POA: Diagnosis not present

## 2024-02-08 DIAGNOSIS — E113493 Type 2 diabetes mellitus with severe nonproliferative diabetic retinopathy without macular edema, bilateral: Secondary | ICD-10-CM | POA: Diagnosis not present

## 2024-02-11 ENCOUNTER — Ambulatory Visit (INDEPENDENT_AMBULATORY_CARE_PROVIDER_SITE_OTHER): Admitting: Podiatry

## 2024-02-11 DIAGNOSIS — S90821A Blister (nonthermal), right foot, initial encounter: Secondary | ICD-10-CM | POA: Diagnosis not present

## 2024-02-11 DIAGNOSIS — R238 Other skin changes: Secondary | ICD-10-CM

## 2024-02-11 NOTE — Progress Notes (Signed)
 Chief Complaint  Patient presents with   Wound Check    Right heel on the lateral/back of the heel has what looks like a blood blister on it. There is fluid in it but it has not ruptured yet.  Been present for aprox 3 weeks. States it is very painful.  Last A1c is unknown, FNS today was 125, no anti coag.    HPI: 78 y.o. female presents today with her caregiver for concern of a large blister on the right heel.  States it is extremely tender.  Her caregiver notes that the patient often puts pressure on this heel in bed.  Past Medical History:  Diagnosis Date   Acquired hammer toes of both feet 01/22/2017   Acute respiratory failure with hypoxia (HCC)    Arthritis 11/29/2013   Atrial flutter (HCC) 01/24/2014   Overview:  CHADS2 vasc score= 5   B-complex deficiency 11/29/2013   Bilateral sensorineural hearing loss 05/17/2019   Biventricular failure (HCC)    Cerebral artery occlusion with cerebral infarction (HCC) 12/04/2013   Overview:  STORY: MRI +ve Left frontoparietal infarction (LMCA). +AFib.`E1o3L`IMPRESSION: BP is under controlled. Continue home health. +AFib, con't Eliquis  5mg  bid. Will obtain old record from [facility] for review.   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   Pneumonia due to COVID-19 virus 10/19/2019   Pre-ulcerative corn or callous 01/22/2017   Pressure injury of skin 10/05/2019   Sleep apnea 11/29/2013    Stroke (HCC) 01/24/2014   has some expressive aphasia   Sudden right hearing loss 05/17/2019   Thyroid  disease 11/29/2013   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  There is a large superficial bulla on the posterior aspect of the right heel.  This measures 4.5 x 3.5 cm.  This appears to have serosanguineous fluid within it.  There is no surrounding erythema.  There is pain on palpation of the area.  Assessment/Plan of Care: 1. Skin bulla     With the patient's consent, ethyl chloride spray was utilized for topical anesthetic.  This was sprayed directly onto the bulla and an 18-gauge needle was utilized to puncture the bulla.  Once punctured, serosanguineous fluid was drained from the area uneventfully.  Iodine solution and a pressure gauze wrapping was applied.  They will change this daily with iodine solution and gauze padding.    A new prism  order will be sent in today  for wound care supplies.  Follow-up in 1 to 2 weeks for recheck.  The need to keep the heel elevated using pillows underneath the calf with no pressure on the posterior aspect of the heel until this resolves.   Joe Murders, DPM, FACFAS [facility]     [address] [place], [place] [address]                Office [phone]  Fax [phone]

## 2024-02-17 ENCOUNTER — Ambulatory Visit: Admitting: Podiatry

## 2024-02-23 DIAGNOSIS — S51811A Laceration without foreign body of right forearm, initial encounter: Secondary | ICD-10-CM | POA: Diagnosis not present

## 2024-02-25 ENCOUNTER — Ambulatory Visit (INDEPENDENT_AMBULATORY_CARE_PROVIDER_SITE_OTHER): Admitting: Podiatry

## 2024-02-25 DIAGNOSIS — Z89422 Acquired absence of other left toe(s): Secondary | ICD-10-CM

## 2024-02-25 DIAGNOSIS — R238 Other skin changes: Secondary | ICD-10-CM

## 2024-02-25 DIAGNOSIS — B351 Tinea unguium: Secondary | ICD-10-CM

## 2024-02-25 DIAGNOSIS — M79675 Pain in left toe(s): Secondary | ICD-10-CM

## 2024-02-25 DIAGNOSIS — Z89421 Acquired absence of other right toe(s): Secondary | ICD-10-CM

## 2024-02-25 DIAGNOSIS — M79674 Pain in right toe(s): Secondary | ICD-10-CM | POA: Diagnosis not present

## 2024-02-25 NOTE — Progress Notes (Unsigned)
 Nails x 8.  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  Also here for recheck of right posterior heel bulla.  This is dry and closed.  It still sensitive.  So we will continue to have gauze dressings applied for the next 7 to 10 days then should be safe to discontinue.  Corn on distal tip of left second toe and dorsal lateral right fifth toe.  These were shaved.  Follow-up 3 months

## 2024-02-29 ENCOUNTER — Other Ambulatory Visit: Payer: Self-pay

## 2024-02-29 MED ORDER — APIXABAN 5 MG PO TABS: 5.0000 mg | ORAL_TABLET | Freq: Two times a day (BID) | ORAL | 3 refills | Status: AC

## 2024-02-29 NOTE — Telephone Encounter (Signed)
 Prescription refill request for Eliquis  received. Indication:aflutter Last office visit:3/25 Scr:1.92  12/24 Age: 78 Weight:101.9  kg  Prescription refilled

## 2024-03-03 ENCOUNTER — Ambulatory Visit: Admitting: Podiatry

## 2024-03-15 DIAGNOSIS — L03119 Cellulitis of unspecified part of limb: Secondary | ICD-10-CM | POA: Diagnosis not present

## 2024-04-13 DIAGNOSIS — Z89422 Acquired absence of other left toe(s): Secondary | ICD-10-CM | POA: Diagnosis not present

## 2024-04-13 DIAGNOSIS — N1832 Chronic kidney disease, stage 3b: Secondary | ICD-10-CM | POA: Diagnosis not present

## 2024-04-13 DIAGNOSIS — Z89421 Acquired absence of other right toe(s): Secondary | ICD-10-CM | POA: Diagnosis not present

## 2024-04-13 DIAGNOSIS — G479 Sleep disorder, unspecified: Secondary | ICD-10-CM | POA: Diagnosis not present

## 2024-04-13 DIAGNOSIS — E113493 Type 2 diabetes mellitus with severe nonproliferative diabetic retinopathy without macular edema, bilateral: Secondary | ICD-10-CM | POA: Diagnosis not present

## 2024-04-13 DIAGNOSIS — E039 Hypothyroidism, unspecified: Secondary | ICD-10-CM | POA: Diagnosis not present

## 2024-04-13 DIAGNOSIS — E1140 Type 2 diabetes mellitus with diabetic neuropathy, unspecified: Secondary | ICD-10-CM | POA: Diagnosis not present

## 2024-04-13 DIAGNOSIS — G4733 Obstructive sleep apnea (adult) (pediatric): Secondary | ICD-10-CM | POA: Diagnosis not present

## 2024-04-13 DIAGNOSIS — E1129 Type 2 diabetes mellitus with other diabetic kidney complication: Secondary | ICD-10-CM | POA: Diagnosis not present

## 2024-04-13 DIAGNOSIS — N2581 Secondary hyperparathyroidism of renal origin: Secondary | ICD-10-CM | POA: Diagnosis not present

## 2024-04-13 DIAGNOSIS — Z6836 Body mass index (BMI) 36.0-36.9, adult: Secondary | ICD-10-CM | POA: Diagnosis not present

## 2024-04-13 DIAGNOSIS — D696 Thrombocytopenia, unspecified: Secondary | ICD-10-CM | POA: Diagnosis not present

## 2024-04-13 DIAGNOSIS — I10 Essential (primary) hypertension: Secondary | ICD-10-CM | POA: Diagnosis not present

## 2024-05-04 DIAGNOSIS — F01511 Vascular dementia, unspecified severity, with agitation: Secondary | ICD-10-CM | POA: Diagnosis not present

## 2024-05-04 DIAGNOSIS — N184 Chronic kidney disease, stage 4 (severe): Secondary | ICD-10-CM | POA: Diagnosis not present

## 2024-05-04 DIAGNOSIS — Z1389 Encounter for screening for other disorder: Secondary | ICD-10-CM | POA: Diagnosis not present

## 2024-05-04 DIAGNOSIS — Z0000 Encounter for general adult medical examination without abnormal findings: Secondary | ICD-10-CM | POA: Diagnosis not present

## 2024-05-04 DIAGNOSIS — M810 Age-related osteoporosis without current pathological fracture: Secondary | ICD-10-CM | POA: Diagnosis not present

## 2024-05-26 ENCOUNTER — Ambulatory Visit: Admitting: Podiatry

## 2024-05-31 DIAGNOSIS — B372 Candidiasis of skin and nail: Secondary | ICD-10-CM | POA: Diagnosis not present

## 2024-06-02 ENCOUNTER — Other Ambulatory Visit: Payer: Self-pay | Admitting: Cardiology

## 2024-06-03 ENCOUNTER — Encounter: Admitting: Podiatry

## 2024-06-03 NOTE — Progress Notes (Signed)
 Patient did not show for her scheduled appointment this morning.

## 2024-06-13 DIAGNOSIS — L89151 Pressure ulcer of sacral region, stage 1: Secondary | ICD-10-CM | POA: Diagnosis not present

## 2024-06-13 DIAGNOSIS — R21 Rash and other nonspecific skin eruption: Secondary | ICD-10-CM | POA: Diagnosis not present

## 2024-06-16 ENCOUNTER — Ambulatory Visit (INDEPENDENT_AMBULATORY_CARE_PROVIDER_SITE_OTHER): Admitting: Podiatry

## 2024-06-16 ENCOUNTER — Ambulatory Visit

## 2024-06-16 ENCOUNTER — Telehealth: Payer: Self-pay | Admitting: Lab

## 2024-06-16 ENCOUNTER — Encounter: Payer: Self-pay | Admitting: Podiatry

## 2024-06-16 DIAGNOSIS — L97522 Non-pressure chronic ulcer of other part of left foot with fat layer exposed: Secondary | ICD-10-CM | POA: Diagnosis not present

## 2024-06-16 DIAGNOSIS — E1142 Type 2 diabetes mellitus with diabetic polyneuropathy: Secondary | ICD-10-CM

## 2024-06-16 DIAGNOSIS — L97412 Non-pressure chronic ulcer of right heel and midfoot with fat layer exposed: Secondary | ICD-10-CM | POA: Diagnosis not present

## 2024-06-16 DIAGNOSIS — E11621 Type 2 diabetes mellitus with foot ulcer: Secondary | ICD-10-CM

## 2024-06-16 DIAGNOSIS — B957 Other staphylococcus as the cause of diseases classified elsewhere: Secondary | ICD-10-CM | POA: Diagnosis not present

## 2024-06-16 DIAGNOSIS — I739 Peripheral vascular disease, unspecified: Secondary | ICD-10-CM

## 2024-06-16 DIAGNOSIS — L080 Pyoderma: Secondary | ICD-10-CM | POA: Diagnosis not present

## 2024-06-16 MED ORDER — DOXYCYCLINE HYCLATE 100 MG PO TABS: 100.0000 mg | ORAL_TABLET | Freq: Two times a day (BID) | ORAL | 0 refills | Status: AC

## 2024-06-16 MED ORDER — MUPIROCIN 2 % EX OINT: 1.0000 | TOPICAL_OINTMENT | Freq: Two times a day (BID) | CUTANEOUS | 2 refills | Status: AC

## 2024-06-16 NOTE — Telephone Encounter (Signed)
 Patient calling to verify on when to start on antibiotic prescribed today is currently on fluconadole weekly has two more weeks to go. Please review and advise cont [phone].

## 2024-06-16 NOTE — Progress Notes (Signed)
 Chief Complaint  Patient presents with   Diabetic Ulcer    Left foot 2nd toe tip. Calloused, with ulcer under.  Right heel spots X 2. Very tender and squishy.  A1c unknown, Eliquis .     HPI: 78 y.o. female presenting today with concern for ulceration left second toe with blister right posterior lateral heel.  Has dealt with left second toe ulceration previously. Dr. Awanda patient. Reports taking fluconazole currently for a separate issue. Accompanied by caretaker today.  Past Medical History:  Diagnosis Date   Acquired hammer toes of both feet 01/22/2017   Acute respiratory failure with hypoxia (HCC)    Arthritis 11/29/2013   Atrial flutter (HCC) 01/24/2014   Overview:  CHADS2 vasc score= 5   B-complex deficiency 11/29/2013   Bilateral sensorineural hearing loss 05/17/2019   Biventricular failure (HCC)    Cerebral artery occlusion with cerebral infarction (HCC) 12/04/2013   Overview:  STORY: MRI +ve Left frontoparietal infarction (LMCA). +AFib.`E1o3L`IMPRESSION: BP is under controlled. Continue home health. +AFib, con't Eliquis  5mg  bid. Will obtain old record from [facility] for review.   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   Pneumonia due to COVID-19 virus 10/19/2019   Pre-ulcerative corn or callous 01/22/2017   Pressure injury of skin 10/05/2019   Sleep apnea 11/29/2013   Stroke (HCC) 01/24/2014   has some  expressive aphasia   Sudden right hearing loss 05/17/2019   Thyroid  disease 11/29/2013   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  General: The patient is alert and oriented x3 in no acute distress.  Dermatology: Skin is warm, dry and supple bilateral lower extremities. Interspaces are clear of maceration and debris.      Wound 1:  Location: Left second toe distal tuft        Depth: Subcutaneous tissue        Wound Border: Hyperkeratotic        Wound Base: Fibrogranular        Drainage: Minimal to none       Odor?:  No        Surrounding Tissue: Edematous, mildly erythematous        Infected?:  Likely cellulitic        Necrosis?:  Nonviable skin and subcutaneous tissue        Pain?:  No        Tunneling: No  Dimensions (cm): Overlying hyperkeratotic callus limiting predebridement measurement, postdebridement measurement 0.8 x 0.5 cm   Wound 2:  Location: Right posterior lateral heel       Depth: Bulla full-thickness subcutaneous tissue       Wound Border:  macerated       Wound Base: Subcutaneous tissue       Drainage: Serosanguineous       Odor?:  No       Surrounding Tissue: maceration       Infected?:  no       Necrosis?:  Nonviable skin subcutaneous tissue       Pain?:  Tender on palpation       Tunneling: No       Dimensions (cm): Bulla formation 1.4 x 2 cm.  Following deroofing nonviable tissue 1.5 x 2 x 0.3  cm.    Vascular: Pedal pulses are faintly palpable 1/4 bilaterally, capillary refill 3 to 5 seconds to digits, decreased pedal hair growth, lower extremities edematous.  Neurological: Light touch sensation decreased  Musculoskeletal Exam: Prior third toe amputations noted bilaterally.  Hammertoe contractures of lesser digits noted.      No data to display           RADIOGRAPHIC EXAM: Left and right foot 3 views 06/16/2024 Right foot: Osteopenia present.  Prior third toe amputation present.  Hallux malleus and hammertoe contractures noted.  Pes planus foot type.  No osteolysis or cortical erosions noted around the area of the heel where the source present.  Left foot:Osteopenia present.  Prior third toe amputation present.  Hallux malleus and hammertoe contractures noted.  Pes planus foot type.  No osteolysis or cortical erosions noted to distal second toe where hammertoe contractures noted at site of ulceration.  ASSESSMENT / PLAN OF CARE: 1. PAD (peripheral artery disease)   2. Diabetic ulcer of toe of left foot associated with type 2 diabetes mellitus, with fat layer exposed (HCC)   3. Skin ulcer of right heel with fat layer exposed (HCC)   4. Diabetic polyneuropathy associated with type 2 diabetes mellitus (HCC)      Meds ordered this encounter  Medications   mupirocin ointment (BACTROBAN) 2 %    Sig: Apply 1 Application topically 2 (two) times daily.    Dispense:  30 g    Refill:  2   doxycycline  (VIBRA -TABS) 100 MG tablet    Sig: Take 1 tablet (100 mg total) by mouth 2 (two) times daily for 10 days.    Dispense:  20 tablet    Refill:  0   VAS US  ABI WITH/WO TBI  Culture swabs of the wound(s) were obtained today of the right heel was ordered today  The ulceration was sharply debrided of hyperkeratotic and devitalized soft tissue with sterile #15 blade to the level of skin subcutaneous tissue of the left second toe and the right posterior lateral heel.  See measurements  above.  Hemostasis obtained with compression, approximately 2 cc of blood loss.SABRA  Antibiotic ointment and DSD applied.  Reviewed off-loading with patient.  Surgical shoe dispensed for patient to wear at all times WB.   Reviewed daily dressing changes with patient.  Course of oral doxycycline  sent to patient's pharmacy, will notify patient of updates to cultures.  Discussed risks / concerns regarding ulcer with patient and possible sequelae if left untreated.  Stressed importance of infection prevention at home. Short-term goals are: prevent infection, off-load ulcer, heal ulcer Long-term goals are:  prevent recurrence,  prevent amputation.   Return in about 2 weeks (around 06/30/2024) for Wound Care.   Elexus Barman L. Lamount MAUL, AACFAS [facility]     [address] [place], [place] [address]                Office [phone]  Fax [phone]

## 2024-06-16 NOTE — Patient Instructions (Addendum)
 Instructions for Wound Care  The most important step to healing a foot wound is to reduce the pressure on your foot - it is extremely important to stay off your foot as much as possible and wear the shoe/boot as instructed.  Cleanse your foot with saline wash or warm soapy water  (dial antibacterial soap or similar).  Blot dry.  Apply prescribed medication to your wound and cover with gauze and a bandage.  May hold bandage in place with Coban (self sticky wrap), Ace bandage or tape.  You may find dressing supplies at your local [employer], [employer], drug store or medical supply store.  Monitor for any signs/symptoms of infection. If there is any increase in redness, red streaks, increase in drainage, warmth to your foot please give us  a call. Also, if you start to run a fever or have flu-like symptoms that can also be a sign of infection. Call the office immediately if any occur or go directly to the emergency room.   If you have any questions, please feel free to give us  a call at [phone] or if you are on MyChart you can always send me a message if needed.    I have ordered vascular testing for you. If you do not hear for them about scheduling within the next 1 week, or you have any questions please give us  a call at [phone].   Antibiotics: You have a prescription for 10 days of doxycycline  twice a day.  Is important that you take this in the morning and then wait 2 hours before taking your iron supplement or any other metal containing medication.  If you do take this in the afternoon typically, wait 4 hours after taking the iron supplement before you take your evening dose of the antibiotic.

## 2024-06-20 NOTE — Telephone Encounter (Signed)
 Could not leave message for patient. She Absolutely needs to start the Doxycycline , do not stop the other ABX. Take both. Per Dr Lamount. CT

## 2024-06-21 NOTE — Progress Notes (Deleted)
 Cardiology Office Note:  .   Date:  06/21/2024  ID:  Debra Mcconnell, DOB Oct 30, 1945, MRN [id number] PCP: Gayl Males, MD  [facility] Providers Cardiologist:  Redell Leiter, MD    History of Present Illness: Debra Mcconnell is a 78 y.o. female with a past medical history of atrial flutter, history of stroke, hypertension, HFrEF, OSA, DM2, CKD, dyslipidemia, severe mitral regurgitation.  11/19/2021 monitor atrial flutter occurred continuously, heart rate ranging 38 to 93 bpm, VE's were occasional at 3.2% 10/14/2021 ABI indexes were normal 02/19/2021 echo EF 50 to 55%, moderate left ventricular thickening, moderately elevated PASP, LA severely dilated, severe MR  Recently she was evaluated by Dr. Leiter on 02/12/2023, she was stable from a cardiac perspective, she was advised she can follow-up in 6 months.  She presents today accompanied by friend for follow-up of her atrial flutter and HFrEF.  She appears to be doing okay from a cardiac perspective, there has been some confusion around getting her medication refills, but she has been taking them as prescribed.  She offers no formal complaints.  She does have some pedal edema however she says this is stable and unchanged for her.  He is tolerating her Eliquis  without hematochezia, hematuria, hemoptysis. She denies chest pain, palpitations, dyspnea, pnd, orthopnea, n, v, dizziness, syncope, weight gain, or early satiety.   ROS: Review of Systems  Cardiovascular:  Positive for leg swelling.  Musculoskeletal:  Positive for falls.  Endo/Heme/Allergies:  Bruises/bleeds easily.  All other systems reviewed and are negative.    Studies Reviewed: .        Cardiac Studies & Procedures   ______________________________________________________________________________________________     ECHOCARDIOGRAM  ECHOCARDIOGRAM COMPLETE 09/25/2023  Narrative ECHOCARDIOGRAM REPORT    Patient Name:   Debra Mcconnell Date of Exam: 09/25/2023 Medical  Rec #:  [id number]     Height:       65.0 in Accession #:    [id number]    Weight:       217.6 lb Date of Birth:  [birth year]    BSA:          2.050 m Patient Age:    77 years      BP:           116/58 mmHg Patient Gender: F             HR:           61 bpm. Exam Location:  [place]  Procedure: 2D Echo, Cardiac Doppler, Color Doppler and Strain Analysis  Indications:    Atrial flutter, unspecified type (HCC) [I48.92 (ICD-10-CM)]; Cerebrovascular accident (CVA), unspecified mechanism (HCC) [I63.9 (ICD-10-CM)]; HFrEF (heart failure with reduced ejection fraction) (HCC) [I50.20 (ICD-10-CM)]; OSA (obstructive sleep apnea) [G47.33 (ICD-10-CM)]; Essential hypertension [I10 (ICD-10-CM)]  History:        Patient has prior history of Echocardiogram examinations, most recent 02/19/2021. CHF, CVA, Mitral Valve Disease, Arrythmias:Atrial Flutter; Risk Factors:Hypertension.  Sonographer:    Charlie Jointer RDCS Referring Phys: [phone] DELON JAYSON HOOVER   Sonographer Comments: Restricted mobility. IMPRESSIONS   1. Atrial flutter . Left ventricular ejection fraction, by estimation, is 35 to 40%. The left ventricle has moderately decreased function. The left ventricle demonstrates global hypokinesis. The left ventricular internal cavity size was mildly dilated. There is mild left ventricular hypertrophy. Left ventricular diastolic parameters are indeterminate. Elevated left ventricular end-diastolic pressure. The average left ventricular global longitudinal strain is 11.3 %. The global longitudinal strain is abnormal. 2. Right  ventricular systolic function is normal. The right ventricular size is normal. There is moderately elevated pulmonary artery systolic pressure. 3. Left atrial size was severely dilated. 4. Right atrial size was moderately dilated. 5. The mitral valve is degenerative. Moderate mitral valve regurgitation. No evidence of mitral stenosis. 6. The aortic valve is normal in structure.  Aortic valve regurgitation is not visualized. Aortic valve sclerosis/calcification is present, without any evidence of aortic stenosis. 7. The inferior vena cava is dilated in size with >50% respiratory variability, suggesting right atrial pressure of 8 mmHg.  FINDINGS Left Ventricle: Atrial flutter. Left ventricular ejection fraction, by estimation, is 35 to 40%. The left ventricle has moderately decreased function. The left ventricle demonstrates global hypokinesis. The average left ventricular global longitudinal strain is 11.3 %. The global longitudinal strain is abnormal. The left ventricular internal cavity size was mildly dilated. There is mild left ventricular hypertrophy. Left ventricular diastolic parameters are indeterminate. Elevated left ventricular end-diastolic pressure.  Right Ventricle: The right ventricular size is normal. No increase in right ventricular wall thickness. Right ventricular systolic function is normal. There is moderately elevated pulmonary artery systolic pressure. The tricuspid regurgitant velocity is 3.50 m/s, and with an assumed right atrial pressure of 8 mmHg, the estimated right ventricular systolic pressure is 57.0 mmHg.  Left Atrium: Left atrial size was severely dilated.  Right Atrium: Right atrial size was moderately dilated.  Pericardium: There is no evidence of pericardial effusion.  Mitral Valve: The mitral valve is degenerative in appearance. There is mild thickening of the anterior and posterior mitral valve leaflet(s). Mild mitral annular calcification. Moderate mitral valve regurgitation. No evidence of mitral valve stenosis.  Tricuspid Valve: The tricuspid valve is normal in structure. Tricuspid valve regurgitation is mild . No evidence of tricuspid stenosis.  Aortic Valve: The aortic valve is normal in structure. Aortic valve regurgitation is not visualized. Aortic valve sclerosis/calcification is present, without any evidence of aortic  stenosis.  Pulmonic Valve: The pulmonic valve was normal in structure. Pulmonic valve regurgitation is not visualized. No evidence of pulmonic stenosis.  Aorta: The aortic arch was not well visualized.  Venous: The pulmonary veins were not well visualized. The inferior vena cava is dilated in size with greater than 50% respiratory variability, suggesting right atrial pressure of 8 mmHg.  IAS/Shunts: No atrial level shunt detected by color flow Doppler.   LEFT VENTRICLE PLAX 2D LVIDd:         5.50 cm   Diastology LVIDs:         4.00 cm   LV e' medial:    6.09 cm/s LV PW:         1.20 cm   LV E/e' medial:  21.0 LV IVS:        1.20 cm   LV e' lateral:   13.90 cm/s LVOT diam:     2.10 cm   LV E/e' lateral: 9.2 LV SV:         74 LV SV Index:   36        2D Longitudinal Strain LVOT Area:     3.46 cm  2D Strain GLS Avg:     11.3 %   RIGHT VENTRICLE            IVC RV Basal diam:  4.90 cm    IVC diam: 3.00 cm RV Mid diam:    3.80 cm RV S prime:     8.92 cm/s TAPSE (M-mode): 2.6 cm  LEFT ATRIUM  Index        RIGHT ATRIUM           Index LA diam:        4.80 cm  2.34 cm/m   RA Area:     25.60 cm LA Vol (A2C):   151.0 ml 73.65 ml/m  RA Volume:   81.60 ml  39.80 ml/m LA Vol (A4C):   165.0 ml 80.47 ml/m LA Biplane Vol: 157.0 ml 76.57 ml/m AORTIC VALVE LVOT Vmax:   95.20 cm/s LVOT Vmean:  63.900 cm/s LVOT VTI:    0.215 m  AORTA Ao Root diam: 3.10 cm Ao Asc diam:  2.80 cm Ao Desc diam: 2.00 cm  MITRAL VALVE                  TRICUSPID VALVE MV Area (PHT): 4.26 cm       TR Peak grad:   49.0 mmHg MV Decel Time: 178 msec       TR Vmax:        350.00 cm/s MR Peak grad:    94.5 mmHg MR Mean grad:    63.0 mmHg    SHUNTS MR Vmax:         486.00 cm/s  Systemic VTI:  0.22 m MR Vmean:        375.0 cm/s   Systemic Diam: 2.10 cm MR PISA:         2.26 cm MR PISA Eff ROA: 18 mm MR PISA Radius:  0.60 cm MV E velocity: 128.00 cm/s MV A velocity: 51.75 cm/s MV E/A ratio:   2.47  Redell Leiter MD Electronically signed by Redell Leiter MD Signature Date/Time: 09/25/2023/12:22:17 PM    Final    MONITORS  LONG TERM MONITOR (3-14 DAYS) 11/18/2021  Narrative Patch Wear Time:  3 days and 0 hours (2023-03-01T12:05:37-0500 to 2023-03-04T12:42:03-499)  Atrial Flutter occurred continuously (100% burden), ranging from 38-93 bpm (avg of 64 bpm). Bundle Branch Block/IVCD was present.  Good heart rate control heart rate in the range 99% of the time daytime 97% time nighttime 50 to 110 bpm and no pauses of greater than 3 seconds or sustained bradycardia.  Isolated VEs were occasional (3.2%, 7925), VE  Couplets were rare (<1.0%, 64), and no VE Triplets were present. Ventricular Bigeminy and Trigeminy were present.  Conclusion atrial fibrillation/flutter with good heart rate control: Occasional PVCs.  There were no symptomatic events.       ______________________________________________________________________________________________      Risk Assessment/Calculations:    CHA2DS2-VASc Score = 8   This indicates a 10.8% annual risk of stroke. The patient's score is based upon: CHF History: 1 HTN History: 1 Diabetes History: 1 Stroke History: 2 Vascular Disease History: 0 Age Score: 2 Gender Score: 1   {This patient has a significant risk of stroke if diagnosed with atrial fibrillation.  Please consider VKA or DOAC agent for anticoagulation if the bleeding risk is acceptable.   You can also use the SmartPhrase .[email or web address] for documentation.   :[id number]} No BP recorded.  {Refresh Note OR Click here to enter BP  :1}***       Physical Exam:   VS:  There were no vitals taken for this visit.   Wt Readings from Last 3 Encounters:  11/17/23 224 lb 9.6 oz (101.9 kg)  08/31/23 217 lb 9.6 oz (98.7 kg)  02/12/23 222 lb 3.2 oz (100.8 kg)    GEN: Well nourished, well developed in no acute distress NECK: No JVD; No carotid  bruits CARDIAC: Irregularly irregular, 2/6  systolic murmur, no rubs, no gallops RESPIRATORY:  Clear to auscultation without rales, wheezing or rhonchi  ABDOMEN: Soft, non-tender, non-distended EXTREMITIES: Nonpitting edema, no deformity   ASSESSMENT AND PLAN: .   Atrial flutter/hypercoagulable state-CHA2DS2-VASc score of 8, rate is controlled, continue Eliquis  5 mg twice daily--no indication for dose reduction we will repeat CBC and BMET.  Continue carvedilol  25 mg twice daily.  Severe mitral regurgitation/DOE-2/6 systolic murmur noted, will repeat echocardiogram.  HFrEF-NYHA class II-III, euvolemic.  Continue Coreg  25 mg twice daily, continue Farxiga  10 mg daily, continue Lasix  20 mg twice daily, continue Entresto  49-51 mg twice daily.  She is currently out of Farxiga , we will provide her with some samples.    Hypertension-blood pressure is well-controlled today at 116/58, continue Coreg  25 mg twice daily, continue Apresoline  25 mg 3 times daily, continue Entresto  49-50 mg twice daily  CKD -currently on Kerendia, careful titration of antihypertensives and diuretics.    Dispo: CBC, BMET, repeat echocardiogram, 52-month follow-up with Dr. Monetta.  Signed, Delon JAYSON Hoover, NP

## 2024-06-22 ENCOUNTER — Ambulatory Visit: Admitting: Cardiology

## 2024-06-22 ENCOUNTER — Ambulatory Visit: Payer: Self-pay | Admitting: Podiatry

## 2024-06-24 DIAGNOSIS — L299 Pruritus, unspecified: Secondary | ICD-10-CM | POA: Diagnosis not present

## 2024-06-24 DIAGNOSIS — S5011XA Contusion of right forearm, initial encounter: Secondary | ICD-10-CM | POA: Diagnosis not present

## 2024-06-24 DIAGNOSIS — S5012XA Contusion of left forearm, initial encounter: Secondary | ICD-10-CM | POA: Diagnosis not present

## 2024-06-24 NOTE — Progress Notes (Signed)
 Spoke with aid. Will continue Doxy.

## 2024-06-27 DIAGNOSIS — E1129 Type 2 diabetes mellitus with other diabetic kidney complication: Secondary | ICD-10-CM | POA: Diagnosis not present

## 2024-06-27 DIAGNOSIS — I10 Essential (primary) hypertension: Secondary | ICD-10-CM | POA: Diagnosis not present

## 2024-06-27 DIAGNOSIS — Z6834 Body mass index (BMI) 34.0-34.9, adult: Secondary | ICD-10-CM | POA: Diagnosis not present

## 2024-06-27 DIAGNOSIS — E1140 Type 2 diabetes mellitus with diabetic neuropathy, unspecified: Secondary | ICD-10-CM | POA: Diagnosis not present

## 2024-06-27 DIAGNOSIS — N2581 Secondary hyperparathyroidism of renal origin: Secondary | ICD-10-CM | POA: Diagnosis not present

## 2024-06-27 DIAGNOSIS — E1169 Type 2 diabetes mellitus with other specified complication: Secondary | ICD-10-CM | POA: Diagnosis not present

## 2024-06-27 DIAGNOSIS — E669 Obesity, unspecified: Secondary | ICD-10-CM | POA: Diagnosis not present

## 2024-06-27 DIAGNOSIS — E039 Hypothyroidism, unspecified: Secondary | ICD-10-CM | POA: Diagnosis not present

## 2024-06-27 DIAGNOSIS — M810 Age-related osteoporosis without current pathological fracture: Secondary | ICD-10-CM | POA: Diagnosis not present

## 2024-06-27 DIAGNOSIS — E113493 Type 2 diabetes mellitus with severe nonproliferative diabetic retinopathy without macular edema, bilateral: Secondary | ICD-10-CM | POA: Diagnosis not present

## 2024-06-27 DIAGNOSIS — Z23 Encounter for immunization: Secondary | ICD-10-CM | POA: Diagnosis not present

## 2024-06-27 DIAGNOSIS — D696 Thrombocytopenia, unspecified: Secondary | ICD-10-CM | POA: Diagnosis not present

## 2024-06-27 DIAGNOSIS — N184 Chronic kidney disease, stage 4 (severe): Secondary | ICD-10-CM | POA: Diagnosis not present

## 2024-06-27 DIAGNOSIS — I5022 Chronic systolic (congestive) heart failure: Secondary | ICD-10-CM | POA: Diagnosis not present

## 2024-06-30 ENCOUNTER — Ambulatory Visit: Admitting: Podiatry

## 2024-06-30 DIAGNOSIS — Z89421 Acquired absence of other right toe(s): Secondary | ICD-10-CM

## 2024-06-30 DIAGNOSIS — B351 Tinea unguium: Secondary | ICD-10-CM

## 2024-06-30 DIAGNOSIS — M79674 Pain in right toe(s): Secondary | ICD-10-CM | POA: Diagnosis not present

## 2024-06-30 DIAGNOSIS — M79675 Pain in left toe(s): Secondary | ICD-10-CM

## 2024-06-30 DIAGNOSIS — L97411 Non-pressure chronic ulcer of right heel and midfoot limited to breakdown of skin: Secondary | ICD-10-CM

## 2024-06-30 DIAGNOSIS — Z89422 Acquired absence of other left toe(s): Secondary | ICD-10-CM | POA: Diagnosis not present

## 2024-06-30 NOTE — Progress Notes (Signed)
Chief Complaint  Patient presents with   Wound Check    Right heel lateral side, ulcer, closed with a scab. Reports no pain in it today.  Also needs nail care today, it has been since June.  A1c reports pretty good, but does not know the number Eliquis .     HPI: 78 y.o. female presenting today for follow-up of an ulcer on the lateral aspect of the right heel.  They have been applying antibiotic ointment and a dressing to the area daily.  She is also requesting diabetic nail care today.  She has previous history of bilateral third toe amputations.  She recently saw Dr. Lamount on 06/16/2024 for the right heel wound  Primary care provider is Dr. Sharlet Speaks who was last seen on 04/13/2024  Past Medical History:  Diagnosis Date   Acquired hammer toes of both feet 01/22/2017   Acute respiratory failure with hypoxia (HCC)    Arthritis 11/29/2013   Atrial flutter (HCC) 01/24/2014   Overview:  CHADS2 vasc score= 5   B-complex deficiency 11/29/2013   Bilateral sensorineural hearing loss 05/17/2019   Biventricular failure (HCC)    Cerebral artery occlusion with cerebral infarction (HCC) 12/04/2013   Overview:  STORY: MRI +ve Left frontoparietal infarction (LMCA). +AFib.`E1o3L`IMPRESSION: BP is under controlled. Continue home health. +AFib, con't Eliquis  5mg  bid. Will obtain old record from [facility] for review.   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   OSA (obstructive sleep apnea) 01/24/2014   CPAP   Osteoarthritis of left  glenohumeral joint 11/21/2021   Pneumonia due to COVID-19 virus 10/19/2019   Pre-ulcerative corn or callous 01/22/2017   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   Pressure injury of skin 10/05/2019   Sleep apnea 11/29/2013   Stroke (HCC) 01/24/2014   has some expressive aphasia   Sudden right hearing loss 05/17/2019   Thyroid  disease 11/29/2013   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  Dermatology: Skin is warm, dry and supple bilateral lower extremities. Interspaces are clear of maceration and debris.  Nails x 8 are 3 mm thick with yellow discoloration, subungual debris, distal onycholysis, and pain with compression.    Wound 1:  Location: Lateral right heel        Depth: Closed        Wound Border: Peeling skin        Wound Base: Not applicable        Drainage:  None       Odor?:  None        Surrounding Tissue: No erythema or edema        Infected?:  No        Necrosis?:  No        Pain?:  No        Tunneling: No       Dimensions (cm): Not applicable, wound is closed   Vascular: Pedal pulses are trace palpable  ASSESSMENT / PLAN OF CARE: 1. Pain due to onychomycosis of toenails of both feet   2. Skin ulcer of right heel, limited to breakdown of skin (HCC)     Toenails were sharply debrided with sterile nail nippers and a power debriding bur x 8 to decrease bulk and length  The ulceration  on the lateral right heel was sharply debrided of hyperkeratotic and devitalized soft tissue with sterile #312 blade to the level of epidermis.  Hemostasis obtained.  Antibiotic ointment and DSD applied.  Reviewed off-loading with patient.  Reviewed daily dressing changes with patient.  She can discontinue dressing changes 3 to 4 days from now.  Keep a close eye on the area and check for any signs of recurrence.  Discussed risks / concerns regarding ulcer with patient and possible sequelae if left untreated.  Stressed importance of infection prevention at home. Short-term goals are: prevent infection, off-load ulcer, heal ulcer Long-term goals are:  prevent recurrence, prevent amputation.   Return in about 3 months (around 09/26/2024) for [facility].   Awanda CHARM Imperial, DPM, FACFAS [facility]     [address] [place], [place] [address]                Office [phone]  Fax [phone]

## 2024-07-04 DIAGNOSIS — E1122 Type 2 diabetes mellitus with diabetic chronic kidney disease: Secondary | ICD-10-CM | POA: Diagnosis present

## 2024-07-04 DIAGNOSIS — R9431 Abnormal electrocardiogram [ECG] [EKG]: Secondary | ICD-10-CM | POA: Diagnosis not present

## 2024-07-04 DIAGNOSIS — E872 Acidosis, unspecified: Secondary | ICD-10-CM | POA: Diagnosis present

## 2024-07-04 DIAGNOSIS — R410 Disorientation, unspecified: Secondary | ICD-10-CM | POA: Diagnosis not present

## 2024-07-04 DIAGNOSIS — S0101XD Laceration without foreign body of scalp, subsequent encounter: Secondary | ICD-10-CM | POA: Diagnosis not present

## 2024-07-04 DIAGNOSIS — R2681 Unsteadiness on feet: Secondary | ICD-10-CM | POA: Diagnosis present

## 2024-07-04 DIAGNOSIS — W19XXXA Unspecified fall, initial encounter: Secondary | ICD-10-CM | POA: Diagnosis not present

## 2024-07-04 DIAGNOSIS — I5022 Chronic systolic (congestive) heart failure: Secondary | ICD-10-CM | POA: Diagnosis present

## 2024-07-04 DIAGNOSIS — E039 Hypothyroidism, unspecified: Secondary | ICD-10-CM | POA: Diagnosis present

## 2024-07-04 DIAGNOSIS — S0990XA Unspecified injury of head, initial encounter: Secondary | ICD-10-CM | POA: Diagnosis not present

## 2024-07-04 DIAGNOSIS — N390 Urinary tract infection, site not specified: Secondary | ICD-10-CM | POA: Diagnosis present

## 2024-07-04 DIAGNOSIS — E1140 Type 2 diabetes mellitus with diabetic neuropathy, unspecified: Secondary | ICD-10-CM | POA: Diagnosis present

## 2024-07-04 DIAGNOSIS — L03115 Cellulitis of right lower limb: Secondary | ICD-10-CM | POA: Diagnosis present

## 2024-07-04 DIAGNOSIS — I69398 Other sequelae of cerebral infarction: Secondary | ICD-10-CM | POA: Diagnosis not present

## 2024-07-04 DIAGNOSIS — E1169 Type 2 diabetes mellitus with other specified complication: Secondary | ICD-10-CM | POA: Diagnosis present

## 2024-07-04 DIAGNOSIS — S0191XA Laceration without foreign body of unspecified part of head, initial encounter: Secondary | ICD-10-CM | POA: Diagnosis not present

## 2024-07-04 DIAGNOSIS — E11649 Type 2 diabetes mellitus with hypoglycemia without coma: Secondary | ICD-10-CM | POA: Diagnosis present

## 2024-07-04 DIAGNOSIS — I444 Left anterior fascicular block: Secondary | ICD-10-CM | POA: Diagnosis not present

## 2024-07-04 DIAGNOSIS — G4733 Obstructive sleep apnea (adult) (pediatric): Secondary | ICD-10-CM | POA: Diagnosis present

## 2024-07-04 DIAGNOSIS — R296 Repeated falls: Secondary | ICD-10-CM | POA: Diagnosis present

## 2024-07-04 DIAGNOSIS — G9341 Metabolic encephalopathy: Secondary | ICD-10-CM | POA: Diagnosis present

## 2024-07-04 DIAGNOSIS — I130 Hypertensive heart and chronic kidney disease with heart failure and stage 1 through stage 4 chronic kidney disease, or unspecified chronic kidney disease: Secondary | ICD-10-CM | POA: Diagnosis present

## 2024-07-04 DIAGNOSIS — I69320 Aphasia following cerebral infarction: Secondary | ICD-10-CM | POA: Diagnosis not present

## 2024-07-04 DIAGNOSIS — D631 Anemia in chronic kidney disease: Secondary | ICD-10-CM | POA: Diagnosis present

## 2024-07-04 DIAGNOSIS — Z79899 Other long term (current) drug therapy: Secondary | ICD-10-CM | POA: Diagnosis not present

## 2024-07-04 DIAGNOSIS — Z7401 Bed confinement status: Secondary | ICD-10-CM | POA: Diagnosis not present

## 2024-07-04 DIAGNOSIS — E441 Mild protein-calorie malnutrition: Secondary | ICD-10-CM | POA: Diagnosis present

## 2024-07-04 DIAGNOSIS — E669 Obesity, unspecified: Secondary | ICD-10-CM | POA: Diagnosis present

## 2024-07-04 DIAGNOSIS — S0101XA Laceration without foreign body of scalp, initial encounter: Secondary | ICD-10-CM | POA: Diagnosis present

## 2024-07-04 DIAGNOSIS — I491 Atrial premature depolarization: Secondary | ICD-10-CM | POA: Diagnosis not present

## 2024-07-04 DIAGNOSIS — B372 Candidiasis of skin and nail: Secondary | ICD-10-CM | POA: Diagnosis present

## 2024-07-04 DIAGNOSIS — Z792 Long term (current) use of antibiotics: Secondary | ICD-10-CM | POA: Diagnosis not present

## 2024-07-04 DIAGNOSIS — F0393 Unspecified dementia, unspecified severity, with mood disturbance: Secondary | ICD-10-CM | POA: Diagnosis present

## 2024-07-04 DIAGNOSIS — I69328 Other speech and language deficits following cerebral infarction: Secondary | ICD-10-CM | POA: Diagnosis not present

## 2024-07-04 DIAGNOSIS — I482 Chronic atrial fibrillation, unspecified: Secondary | ICD-10-CM | POA: Diagnosis present

## 2024-07-04 DIAGNOSIS — M6281 Muscle weakness (generalized): Secondary | ICD-10-CM | POA: Diagnosis present

## 2024-07-04 DIAGNOSIS — Z66 Do not resuscitate: Secondary | ICD-10-CM | POA: Diagnosis present

## 2024-07-04 DIAGNOSIS — I340 Nonrheumatic mitral (valve) insufficiency: Secondary | ICD-10-CM | POA: Diagnosis present

## 2024-07-04 DIAGNOSIS — I498 Other specified cardiac arrhythmias: Secondary | ICD-10-CM | POA: Diagnosis not present

## 2024-07-04 DIAGNOSIS — E119 Type 2 diabetes mellitus without complications: Secondary | ICD-10-CM | POA: Diagnosis present

## 2024-07-04 DIAGNOSIS — N1832 Chronic kidney disease, stage 3b: Secondary | ICD-10-CM | POA: Diagnosis present

## 2024-07-04 DIAGNOSIS — E785 Hyperlipidemia, unspecified: Secondary | ICD-10-CM | POA: Diagnosis present

## 2024-07-04 DIAGNOSIS — S199XXA Unspecified injury of neck, initial encounter: Secondary | ICD-10-CM | POA: Diagnosis not present

## 2024-07-04 DIAGNOSIS — R531 Weakness: Secondary | ICD-10-CM | POA: Diagnosis not present

## 2024-07-07 DIAGNOSIS — I5022 Chronic systolic (congestive) heart failure: Secondary | ICD-10-CM | POA: Diagnosis present

## 2024-07-07 DIAGNOSIS — W19XXXD Unspecified fall, subsequent encounter: Secondary | ICD-10-CM | POA: Diagnosis not present

## 2024-07-07 DIAGNOSIS — G9341 Metabolic encephalopathy: Secondary | ICD-10-CM | POA: Diagnosis present

## 2024-07-07 DIAGNOSIS — R296 Repeated falls: Secondary | ICD-10-CM | POA: Diagnosis present

## 2024-07-07 DIAGNOSIS — R269 Unspecified abnormalities of gait and mobility: Secondary | ICD-10-CM | POA: Diagnosis not present

## 2024-07-07 DIAGNOSIS — E669 Obesity, unspecified: Secondary | ICD-10-CM | POA: Diagnosis not present

## 2024-07-07 DIAGNOSIS — L03115 Cellulitis of right lower limb: Secondary | ICD-10-CM | POA: Diagnosis not present

## 2024-07-07 DIAGNOSIS — I63533 Cerebral infarction due to unspecified occlusion or stenosis of bilateral posterior cerebral arteries: Secondary | ICD-10-CM | POA: Diagnosis present

## 2024-07-07 DIAGNOSIS — B379 Candidiasis, unspecified: Secondary | ICD-10-CM | POA: Diagnosis not present

## 2024-07-07 DIAGNOSIS — N390 Urinary tract infection, site not specified: Secondary | ICD-10-CM | POA: Diagnosis not present

## 2024-07-07 DIAGNOSIS — F419 Anxiety disorder, unspecified: Secondary | ICD-10-CM | POA: Diagnosis present

## 2024-07-07 DIAGNOSIS — G4700 Insomnia, unspecified: Secondary | ICD-10-CM | POA: Diagnosis present

## 2024-07-07 DIAGNOSIS — R410 Disorientation, unspecified: Secondary | ICD-10-CM | POA: Diagnosis not present

## 2024-07-07 DIAGNOSIS — I69328 Other speech and language deficits following cerebral infarction: Secondary | ICD-10-CM | POA: Diagnosis not present

## 2024-07-07 DIAGNOSIS — Z7401 Bed confinement status: Secondary | ICD-10-CM | POA: Diagnosis not present

## 2024-07-07 DIAGNOSIS — S0101XD Laceration without foreign body of scalp, subsequent encounter: Secondary | ICD-10-CM | POA: Diagnosis not present

## 2024-07-07 DIAGNOSIS — B372 Candidiasis of skin and nail: Secondary | ICD-10-CM | POA: Diagnosis present

## 2024-07-07 DIAGNOSIS — L89152 Pressure ulcer of sacral region, stage 2: Secondary | ICD-10-CM | POA: Diagnosis not present

## 2024-07-07 DIAGNOSIS — M810 Age-related osteoporosis without current pathological fracture: Secondary | ICD-10-CM | POA: Diagnosis present

## 2024-07-07 DIAGNOSIS — I69398 Other sequelae of cerebral infarction: Secondary | ICD-10-CM | POA: Diagnosis not present

## 2024-07-07 DIAGNOSIS — Z741 Need for assistance with personal care: Secondary | ICD-10-CM | POA: Diagnosis present

## 2024-07-07 DIAGNOSIS — E872 Acidosis, unspecified: Secondary | ICD-10-CM | POA: Diagnosis present

## 2024-07-07 DIAGNOSIS — M6281 Muscle weakness (generalized): Secondary | ICD-10-CM | POA: Diagnosis present

## 2024-07-07 DIAGNOSIS — E039 Hypothyroidism, unspecified: Secondary | ICD-10-CM | POA: Diagnosis present

## 2024-07-07 DIAGNOSIS — R2681 Unsteadiness on feet: Secondary | ICD-10-CM | POA: Diagnosis present

## 2024-07-07 DIAGNOSIS — I482 Chronic atrial fibrillation, unspecified: Secondary | ICD-10-CM | POA: Diagnosis present

## 2024-07-07 DIAGNOSIS — Z7409 Other reduced mobility: Secondary | ICD-10-CM | POA: Diagnosis present

## 2024-07-07 DIAGNOSIS — D649 Anemia, unspecified: Secondary | ICD-10-CM | POA: Diagnosis present

## 2024-07-07 DIAGNOSIS — R32 Unspecified urinary incontinence: Secondary | ICD-10-CM | POA: Diagnosis not present

## 2024-07-07 DIAGNOSIS — E119 Type 2 diabetes mellitus without complications: Secondary | ICD-10-CM | POA: Diagnosis present

## 2024-07-07 DIAGNOSIS — S0101XA Laceration without foreign body of scalp, initial encounter: Secondary | ICD-10-CM | POA: Diagnosis not present

## 2024-07-07 DIAGNOSIS — N1832 Chronic kidney disease, stage 3b: Secondary | ICD-10-CM | POA: Diagnosis present

## 2024-07-07 DIAGNOSIS — F0390 Unspecified dementia without behavioral disturbance: Secondary | ICD-10-CM | POA: Diagnosis present

## 2024-07-07 DIAGNOSIS — L89313 Pressure ulcer of right buttock, stage 3: Secondary | ICD-10-CM | POA: Diagnosis not present

## 2024-07-07 DIAGNOSIS — E441 Mild protein-calorie malnutrition: Secondary | ICD-10-CM | POA: Diagnosis present

## 2024-07-07 DIAGNOSIS — I69320 Aphasia following cerebral infarction: Secondary | ICD-10-CM | POA: Diagnosis not present

## 2024-07-08 DIAGNOSIS — S0101XD Laceration without foreign body of scalp, subsequent encounter: Secondary | ICD-10-CM | POA: Diagnosis not present

## 2024-07-08 DIAGNOSIS — W19XXXD Unspecified fall, subsequent encounter: Secondary | ICD-10-CM | POA: Diagnosis not present

## 2024-07-08 DIAGNOSIS — E119 Type 2 diabetes mellitus without complications: Secondary | ICD-10-CM | POA: Diagnosis not present

## 2024-07-08 DIAGNOSIS — I482 Chronic atrial fibrillation, unspecified: Secondary | ICD-10-CM | POA: Diagnosis not present

## 2024-07-13 ENCOUNTER — Ambulatory Visit

## 2024-07-14 DIAGNOSIS — R32 Unspecified urinary incontinence: Secondary | ICD-10-CM | POA: Diagnosis not present

## 2024-07-14 DIAGNOSIS — E669 Obesity, unspecified: Secondary | ICD-10-CM | POA: Diagnosis not present

## 2024-07-14 DIAGNOSIS — B379 Candidiasis, unspecified: Secondary | ICD-10-CM | POA: Diagnosis not present

## 2024-07-14 DIAGNOSIS — L89152 Pressure ulcer of sacral region, stage 2: Secondary | ICD-10-CM | POA: Diagnosis not present

## 2024-07-18 DIAGNOSIS — S0101XA Laceration without foreign body of scalp, initial encounter: Secondary | ICD-10-CM | POA: Diagnosis not present

## 2024-07-18 DIAGNOSIS — R296 Repeated falls: Secondary | ICD-10-CM | POA: Diagnosis not present

## 2024-07-18 DIAGNOSIS — R269 Unspecified abnormalities of gait and mobility: Secondary | ICD-10-CM | POA: Diagnosis not present

## 2024-07-18 DIAGNOSIS — G9341 Metabolic encephalopathy: Secondary | ICD-10-CM | POA: Diagnosis not present

## 2024-07-20 ENCOUNTER — Ambulatory Visit: Admitting: Cardiology

## 2024-07-21 DIAGNOSIS — L89313 Pressure ulcer of right buttock, stage 3: Secondary | ICD-10-CM | POA: Diagnosis not present

## 2024-07-21 DIAGNOSIS — E119 Type 2 diabetes mellitus without complications: Secondary | ICD-10-CM | POA: Diagnosis not present

## 2024-07-21 DIAGNOSIS — D649 Anemia, unspecified: Secondary | ICD-10-CM | POA: Diagnosis not present

## 2024-07-21 DIAGNOSIS — I5022 Chronic systolic (congestive) heart failure: Secondary | ICD-10-CM | POA: Diagnosis not present

## 2024-07-28 DIAGNOSIS — E119 Type 2 diabetes mellitus without complications: Secondary | ICD-10-CM | POA: Diagnosis not present

## 2024-07-28 DIAGNOSIS — I5022 Chronic systolic (congestive) heart failure: Secondary | ICD-10-CM | POA: Diagnosis not present

## 2024-07-28 DIAGNOSIS — L89313 Pressure ulcer of right buttock, stage 3: Secondary | ICD-10-CM | POA: Diagnosis not present

## 2024-07-28 DIAGNOSIS — D649 Anemia, unspecified: Secondary | ICD-10-CM | POA: Diagnosis not present

## 2024-08-11 DIAGNOSIS — L89313 Pressure ulcer of right buttock, stage 3: Secondary | ICD-10-CM | POA: Diagnosis not present

## 2024-08-11 DIAGNOSIS — E119 Type 2 diabetes mellitus without complications: Secondary | ICD-10-CM | POA: Diagnosis not present

## 2024-08-11 DIAGNOSIS — D649 Anemia, unspecified: Secondary | ICD-10-CM | POA: Diagnosis not present

## 2024-08-11 DIAGNOSIS — I5022 Chronic systolic (congestive) heart failure: Secondary | ICD-10-CM | POA: Diagnosis not present

## 2024-08-18 DIAGNOSIS — I5022 Chronic systolic (congestive) heart failure: Secondary | ICD-10-CM | POA: Diagnosis not present

## 2024-08-18 DIAGNOSIS — N1832 Chronic kidney disease, stage 3b: Secondary | ICD-10-CM | POA: Diagnosis not present

## 2024-08-18 DIAGNOSIS — E1142 Type 2 diabetes mellitus with diabetic polyneuropathy: Secondary | ICD-10-CM | POA: Diagnosis not present

## 2024-08-18 DIAGNOSIS — E039 Hypothyroidism, unspecified: Secondary | ICD-10-CM | POA: Diagnosis not present

## 2024-08-22 DIAGNOSIS — N1832 Chronic kidney disease, stage 3b: Secondary | ICD-10-CM | POA: Diagnosis not present

## 2024-08-22 DIAGNOSIS — E1142 Type 2 diabetes mellitus with diabetic polyneuropathy: Secondary | ICD-10-CM | POA: Diagnosis not present

## 2024-08-22 DIAGNOSIS — I5022 Chronic systolic (congestive) heart failure: Secondary | ICD-10-CM | POA: Diagnosis not present

## 2024-08-22 DIAGNOSIS — E039 Hypothyroidism, unspecified: Secondary | ICD-10-CM | POA: Diagnosis not present

## 2024-09-29 ENCOUNTER — Ambulatory Visit: Admitting: Podiatry

## 2024-10-09 DEATH — deceased
